# Patient Record
Sex: Female | Born: 1935 | Race: White | Hispanic: No | Marital: Married | Smoking: Never smoker
Health system: Southern US, Community
[De-identification: ages and names within clinical notes are randomized; demographics above are authoritative.]

## PROBLEM LIST (undated history)

## (undated) DIAGNOSIS — I482 Chronic atrial fibrillation, unspecified: Secondary | ICD-10-CM

## (undated) DIAGNOSIS — Z7901 Long term (current) use of anticoagulants: Secondary | ICD-10-CM

## (undated) DIAGNOSIS — E039 Hypothyroidism, unspecified: Secondary | ICD-10-CM

## (undated) DIAGNOSIS — G5600 Carpal tunnel syndrome, unspecified upper limb: Secondary | ICD-10-CM

## (undated) DIAGNOSIS — R0981 Nasal congestion: Secondary | ICD-10-CM

## (undated) HISTORY — DX: Long term (current) use of anticoagulants: Z79.01

## (undated) HISTORY — DX: Hypothyroidism, unspecified: E03.9

## (undated) HISTORY — PX: CHOLECYSTECTOMY: SHX55

## (undated) HISTORY — DX: Chronic atrial fibrillation, unspecified: I48.20

## (undated) HISTORY — DX: Nasal congestion: R09.81

## (undated) HISTORY — DX: Carpal tunnel syndrome, unspecified upper limb: G56.00

## (undated) HISTORY — PX: APPENDECTOMY: SHX54

---

## 1998-02-21 ENCOUNTER — Ambulatory Visit (HOSPITAL_COMMUNITY): Admission: RE | Admit: 1998-02-21 | Discharge: 1998-02-21 | Payer: Self-pay | Admitting: Gastroenterology

## 1999-06-11 ENCOUNTER — Encounter: Payer: Self-pay | Admitting: Family Medicine

## 1999-06-11 ENCOUNTER — Encounter: Admission: RE | Admit: 1999-06-11 | Discharge: 1999-06-11 | Payer: Self-pay | Admitting: Family Medicine

## 1999-07-24 ENCOUNTER — Encounter: Admission: RE | Admit: 1999-07-24 | Discharge: 1999-07-24 | Payer: Self-pay | Admitting: Family Medicine

## 1999-07-24 ENCOUNTER — Encounter: Payer: Self-pay | Admitting: Family Medicine

## 1999-08-28 ENCOUNTER — Encounter: Payer: Self-pay | Admitting: Family Medicine

## 1999-08-28 ENCOUNTER — Encounter: Admission: RE | Admit: 1999-08-28 | Discharge: 1999-08-28 | Payer: Self-pay | Admitting: Family Medicine

## 2000-05-28 ENCOUNTER — Encounter: Payer: Self-pay | Admitting: Family Medicine

## 2000-05-28 ENCOUNTER — Encounter: Admission: RE | Admit: 2000-05-28 | Discharge: 2000-05-28 | Payer: Self-pay | Admitting: Family Medicine

## 2000-06-10 ENCOUNTER — Encounter: Admission: RE | Admit: 2000-06-10 | Discharge: 2000-09-08 | Payer: Self-pay | Admitting: Family Medicine

## 2000-09-18 ENCOUNTER — Encounter: Payer: Self-pay | Admitting: Family Medicine

## 2000-09-18 ENCOUNTER — Encounter: Admission: RE | Admit: 2000-09-18 | Discharge: 2000-09-18 | Payer: Self-pay | Admitting: Family Medicine

## 2001-04-02 ENCOUNTER — Encounter: Payer: Self-pay | Admitting: Family Medicine

## 2001-04-02 ENCOUNTER — Encounter: Admission: RE | Admit: 2001-04-02 | Discharge: 2001-04-02 | Payer: Self-pay | Admitting: Family Medicine

## 2002-01-05 ENCOUNTER — Encounter: Admission: RE | Admit: 2002-01-05 | Discharge: 2002-01-05 | Payer: Self-pay | Admitting: Family Medicine

## 2002-01-05 ENCOUNTER — Encounter: Payer: Self-pay | Admitting: Family Medicine

## 2002-04-01 ENCOUNTER — Encounter: Payer: Self-pay | Admitting: Family Medicine

## 2002-04-01 ENCOUNTER — Encounter: Admission: RE | Admit: 2002-04-01 | Discharge: 2002-04-01 | Payer: Self-pay | Admitting: Family Medicine

## 2003-01-11 ENCOUNTER — Encounter: Payer: Self-pay | Admitting: Family Medicine

## 2003-01-11 ENCOUNTER — Encounter: Admission: RE | Admit: 2003-01-11 | Discharge: 2003-01-11 | Payer: Self-pay | Admitting: Family Medicine

## 2003-08-15 ENCOUNTER — Ambulatory Visit (HOSPITAL_COMMUNITY): Admission: RE | Admit: 2003-08-15 | Discharge: 2003-08-15 | Payer: Self-pay | Admitting: Gastroenterology

## 2004-01-15 ENCOUNTER — Encounter: Admission: RE | Admit: 2004-01-15 | Discharge: 2004-01-15 | Payer: Self-pay | Admitting: Family Medicine

## 2004-05-17 ENCOUNTER — Encounter: Admission: RE | Admit: 2004-05-17 | Discharge: 2004-05-17 | Payer: Self-pay | Admitting: Family Medicine

## 2004-07-10 HISTORY — PX: CARDIOVASCULAR STRESS TEST: SHX262

## 2005-01-15 ENCOUNTER — Encounter: Admission: RE | Admit: 2005-01-15 | Discharge: 2005-01-15 | Payer: Self-pay | Admitting: Family Medicine

## 2005-10-29 ENCOUNTER — Encounter: Payer: Self-pay | Admitting: Emergency Medicine

## 2005-10-30 ENCOUNTER — Inpatient Hospital Stay (HOSPITAL_COMMUNITY): Admission: EM | Admit: 2005-10-30 | Discharge: 2005-10-31 | Payer: Self-pay | Admitting: Emergency Medicine

## 2005-12-01 ENCOUNTER — Encounter: Admission: RE | Admit: 2005-12-01 | Discharge: 2005-12-01 | Payer: Self-pay | Admitting: Family Medicine

## 2006-01-16 ENCOUNTER — Encounter: Admission: RE | Admit: 2006-01-16 | Discharge: 2006-01-16 | Payer: Self-pay | Admitting: Family Medicine

## 2007-01-20 ENCOUNTER — Encounter: Admission: RE | Admit: 2007-01-20 | Discharge: 2007-01-20 | Payer: Self-pay | Admitting: Family Medicine

## 2007-12-06 HISTORY — PX: US ECHOCARDIOGRAPHY: HXRAD669

## 2008-01-28 ENCOUNTER — Encounter: Admission: RE | Admit: 2008-01-28 | Discharge: 2008-01-28 | Payer: Self-pay | Admitting: Family Medicine

## 2008-03-08 ENCOUNTER — Encounter: Admission: RE | Admit: 2008-03-08 | Discharge: 2008-03-08 | Payer: Self-pay | Admitting: Family Medicine

## 2009-01-31 ENCOUNTER — Encounter: Admission: RE | Admit: 2009-01-31 | Discharge: 2009-01-31 | Payer: Self-pay | Admitting: Family Medicine

## 2009-12-28 ENCOUNTER — Ambulatory Visit: Payer: Self-pay | Admitting: Cardiology

## 2010-01-28 ENCOUNTER — Ambulatory Visit: Payer: Self-pay | Admitting: Cardiology

## 2010-01-30 ENCOUNTER — Encounter: Admission: RE | Admit: 2010-01-30 | Discharge: 2010-01-30 | Payer: Self-pay | Admitting: Family Medicine

## 2010-02-25 ENCOUNTER — Ambulatory Visit: Payer: Self-pay | Admitting: Cardiology

## 2010-03-12 ENCOUNTER — Ambulatory Visit: Payer: Self-pay | Admitting: Cardiovascular Disease

## 2010-04-01 ENCOUNTER — Ambulatory Visit: Payer: Self-pay | Admitting: Cardiology

## 2010-04-15 ENCOUNTER — Ambulatory Visit: Payer: Self-pay | Admitting: Cardiology

## 2010-05-16 ENCOUNTER — Ambulatory Visit: Payer: Self-pay | Admitting: Cardiology

## 2010-06-12 ENCOUNTER — Ambulatory Visit: Payer: Self-pay | Admitting: Cardiovascular Disease

## 2010-07-05 ENCOUNTER — Ambulatory Visit (INDEPENDENT_AMBULATORY_CARE_PROVIDER_SITE_OTHER): Payer: Medicare Other | Admitting: Cardiology

## 2010-07-05 DIAGNOSIS — I119 Hypertensive heart disease without heart failure: Secondary | ICD-10-CM

## 2010-07-05 DIAGNOSIS — I4891 Unspecified atrial fibrillation: Secondary | ICD-10-CM

## 2010-07-05 DIAGNOSIS — E119 Type 2 diabetes mellitus without complications: Secondary | ICD-10-CM

## 2010-07-16 ENCOUNTER — Other Ambulatory Visit: Payer: Self-pay | Admitting: *Deleted

## 2010-07-16 ENCOUNTER — Ambulatory Visit
Admission: RE | Admit: 2010-07-16 | Discharge: 2010-07-16 | Disposition: A | Discharge status: Home / Self Care | Payer: PRIVATE HEALTH INSURANCE | Source: Ambulatory Visit | Attending: *Deleted | Admitting: *Deleted

## 2010-08-05 ENCOUNTER — Encounter (INDEPENDENT_AMBULATORY_CARE_PROVIDER_SITE_OTHER): Payer: Medicare Other

## 2010-08-05 DIAGNOSIS — I4891 Unspecified atrial fibrillation: Secondary | ICD-10-CM

## 2010-08-05 DIAGNOSIS — Z7901 Long term (current) use of anticoagulants: Secondary | ICD-10-CM

## 2010-09-03 ENCOUNTER — Ambulatory Visit (INDEPENDENT_AMBULATORY_CARE_PROVIDER_SITE_OTHER): Payer: Medicare Other | Admitting: *Deleted

## 2010-09-03 DIAGNOSIS — Z7901 Long term (current) use of anticoagulants: Secondary | ICD-10-CM

## 2010-09-03 DIAGNOSIS — I4891 Unspecified atrial fibrillation: Secondary | ICD-10-CM

## 2010-09-03 LAB — POCT INR: INR: 2.6

## 2010-09-04 ENCOUNTER — Encounter: Payer: PRIVATE HEALTH INSURANCE | Admitting: *Deleted

## 2010-09-09 ENCOUNTER — Other Ambulatory Visit: Payer: Self-pay | Admitting: Neurology

## 2010-09-09 DIAGNOSIS — I10 Essential (primary) hypertension: Secondary | ICD-10-CM

## 2010-09-09 DIAGNOSIS — E039 Hypothyroidism, unspecified: Secondary | ICD-10-CM

## 2010-09-09 DIAGNOSIS — G501 Atypical facial pain: Secondary | ICD-10-CM

## 2010-09-09 DIAGNOSIS — E119 Type 2 diabetes mellitus without complications: Secondary | ICD-10-CM

## 2010-09-09 DIAGNOSIS — I4891 Unspecified atrial fibrillation: Secondary | ICD-10-CM

## 2010-09-09 DIAGNOSIS — G5600 Carpal tunnel syndrome, unspecified upper limb: Secondary | ICD-10-CM

## 2010-09-24 ENCOUNTER — Ambulatory Visit
Admission: RE | Admit: 2010-09-24 | Discharge: 2010-09-24 | Disposition: A | Discharge status: Home / Self Care | Payer: Medicare Other | Source: Ambulatory Visit | Attending: Neurology | Admitting: Neurology

## 2010-09-24 DIAGNOSIS — E039 Hypothyroidism, unspecified: Secondary | ICD-10-CM

## 2010-09-24 DIAGNOSIS — E119 Type 2 diabetes mellitus without complications: Secondary | ICD-10-CM

## 2010-09-24 DIAGNOSIS — G5600 Carpal tunnel syndrome, unspecified upper limb: Secondary | ICD-10-CM

## 2010-09-24 DIAGNOSIS — I4891 Unspecified atrial fibrillation: Secondary | ICD-10-CM

## 2010-09-24 DIAGNOSIS — I10 Essential (primary) hypertension: Secondary | ICD-10-CM

## 2010-09-24 DIAGNOSIS — G501 Atypical facial pain: Secondary | ICD-10-CM

## 2010-10-04 NOTE — H&P (Signed)
NAME:  Amanda [name], Amanda [name] NO.:  [id number]   MEDICAL RECORD NO.:  [id number]          PATIENT TYPE:  INP   LOCATION:  1826                         FACILITY:  [facility]   PHYSICIAN:  Sherin Quarry, MD      DATE OF BIRTH:  04-13-1919   DATE OF ADMISSION:  10/29/2005  DATE OF DISCHARGE:                                HISTORY & PHYSICAL   Amanda [name] is an 77 year old lady who is generally in good health.  The events of tonight began when she was coming home from the dentist and  noticed that it had begun to rain.  She tried to run from the car and run up  some steps to get out of the rain; unfortunately when she got to the second  step, she slipped and fell sustaining an injury to her right arm and also  heading her head.  She did not lose consciousness but the blow to her head  was quite significant and she has a large bruise over her eyebrow.  She was  initially seen at [facility] and x-rays of her arm showed that she  had a fractured humerus.  Her arm was placed in a sling.  She was sent for a  CT scan of the brain which showed no acute injury.  Family members were  cautioned about possible concussion and she was discharged to be observed by  the family.  After she was discharge, she went home, got in her recliner,  ate a small snack and then told her daughter that she felt weird.  She said  she felt somewhat nauseated.  Her daughter says that she look like she was  going to pass out so she put her feet up.  In spite of this, she went ahead  and passed out briefly.  She made gurgling sound when she breathed.  EMS was  summoned and she revived but seemed to be confused.  She then vomited on one  occasion and then became completely alert.  Since then, she has been totally  alert and really has no specific complaints except for pain related to the  arm and head injuries.  When she returned the second time, she came to [facility].   On arrival to  the emergency room, her blood pressure was 124/77, pulse 69,  respirations 20, O2 saturation was 93%.  Laboratory studies obtained  included a sodium 137, potassium 4.2, glucose of 217, creatinine was 1.0,  INR was 2.4.  The CT scan of the brain was repeated and once again shows no  significant abnormalities.  Because of this brief syncopal episode, she is  admitted for monitoring.  She denies recent trouble with breathing  difficulty, chest pain, abdominal pain, hematemesis or melena.   PAST MEDICAL HISTORY:  She states that she is allergic to CODEINE and SULFA  drugs.  Her current medications are warfarin which she takes according to  instructions from Dr. Patty Sermons.  She says that she takes one 5 mg tablet 5  days a week and then takes 2 tablets 2 days a week.  She also takes Amaryl 2  mg daily, lisinopril 10 mg daily, aspirin 81 mg daily, Synthroid 88 mcg  daily.   OPERATIONS:  She had a cholecystectomy many years ago.   MEDICAL ILLNESSES:  1.  Atrial fibrillation:  This has been present for five months.  2.  Diabetes:  This is said to be well regulated and her blood sugars are      generally in the range of 120-140 and her A1c hemoglobins have been      consistently less than 7.  3.  Hypertension.  4.  Hypothyroidism.   FAMILY HISTORY:  Noncontributory due to the patient's advanced age.   SOCIAL HISTORY:  She does not smoke.  She does not abuse alcohol or drugs.  She has a large extended family that is very attentive to her needs.   REVIEW OF SYSTEMS:  HEAD:  She has a dull headache associated with a large  bruise on her forehead.  EAR/NOSE/THROAT.  Denies earache, sinus pain or  sore throat.  CHEST:  Denies coughing wheezing or chest congestion.  CARDIOVASCULAR:  See above.  GI:  Besides the one episode of vomiting, she  has not had hematemesis, melena, hematochezia or abdominal pain.  GU:  Denies dysuria or urinary frequency.  NEURO:  There is no history of seizure  or  stroke.  ENDOCRINE:  See above.   PHYSICAL EXAM:  She is currently very alert.  I was asked to examine her out  in the hall so doing a detailed examination was difficult.  HEENT exam was  remarkable for a large bruise over her right eye.  Extraocular movements  were full.  Tympanic membranes were clear.  Nares is patent.  Pharynx is  without erythema or exudate.  The chest is clear.  Cardiovascular exam  reveals irregularly, irregular heart rhythm consistent with atrial  fibrillation.  The abdomen is benign grossly.  There is no guarding or  rebound.  Neurologic testing shows the patient is alert and oriented x3.  Station and gait cannot be tested.  Cerebellar exam was made difficult by  the patient's arm injury.  Examination of the extremities shows the right  arm to be in a sling secondary to the humerus fracture.   IMPRESSION:  1.  Brief syncopal episode:  Consider vagal syndrome or transient      arrhythmia.  2.  History of head injury with possible concussion.  Negative CT scan x2.  3.  Atrial fibrillation.  4.  Diabetes.  5.  Hypertension.  6.  Hypothyroidism.   The patient will be admitted essentially for monitoring.  We will follow  heart rhythm closely.  We will continue her medications.  We will ask Dr.  Patty Sermons to see her and see if he thinks any other studies were indicated.           ______________________________  Sherin Quarry, MD     SY/MEDQ  D:  10/30/2005  T:  10/30/2005  Job:  [id number]   cc:   Donia Guiles, M.D.  Fax: [phone]   Cassell Clement, M.D.  Fax: [phone]

## 2010-10-09 ENCOUNTER — Ambulatory Visit (INDEPENDENT_AMBULATORY_CARE_PROVIDER_SITE_OTHER): Payer: Medicare Other | Admitting: Cardiology

## 2010-10-09 ENCOUNTER — Ambulatory Visit (INDEPENDENT_AMBULATORY_CARE_PROVIDER_SITE_OTHER): Payer: Medicare Other | Admitting: *Deleted

## 2010-10-09 ENCOUNTER — Encounter: Payer: Self-pay | Admitting: Cardiology

## 2010-10-09 DIAGNOSIS — I4891 Unspecified atrial fibrillation: Secondary | ICD-10-CM

## 2010-10-09 DIAGNOSIS — I119 Hypertensive heart disease without heart failure: Secondary | ICD-10-CM

## 2010-10-09 DIAGNOSIS — Z7901 Long term (current) use of anticoagulants: Secondary | ICD-10-CM

## 2010-10-09 DIAGNOSIS — E119 Type 2 diabetes mellitus without complications: Secondary | ICD-10-CM

## 2010-10-09 DIAGNOSIS — E039 Hypothyroidism, unspecified: Secondary | ICD-10-CM

## 2010-10-09 NOTE — Assessment & Plan Note (Signed)
This pleasant elderly woman who just celebrated her 89th birthday last week.  She has a history of atrial fibrillation and is on long-term Coumadin.  Since last visit she's had no new cardiac complaints.  She denies any chest pain or shortness of breath.  Her energy level is good.  She has not had any TIA symptoms or systemic emboli symptoms.  She's not having any problem from her Coumadin and her INRs have remained therapeutic

## 2010-10-09 NOTE — Progress Notes (Signed)
Renie Ora Diclemente Date of Birth:  [birth year] [facility] Card[facility] / [facility] [address]   [address] [place], [place]  [address] [phone]           Fax   [phone]  History of Present Illness: This pleasant 77 year old woman is seen for a scheduled 3 month followup office visit.  She has a history of established atrial fibrillation she has not been having any new cardiac symptoms.  Her last echocardiogram was 12/06/07 at which time she had normal left ventricular systolic function with an ejection fraction of 55-60% and no wall motion abnormalities.  She does have mild left atrial enlargement.  There were no significant valve abnormalities noted on echo.  Patient does not have any history of ischemic heart disease.  She had a normal nuclear stress test on 07/10/04 No evidence of ischemia and showing normal LV function with an ejection fraction of 81%. The patient is diabetic.  She is not having any hypoglycemic episodes.  Patient also has a history of chronic sinus problems and is being treated by her primary care physician and has also seen Dr. Gerilyn Pilgrim who did not recommend any surgery.  Current Outpatient Prescriptions  Medication Sig Dispense Refill  . Acetaminophen (TYLENOL PO) Take by mouth. As needed       . fluticasone (FLONASE) 50 MCG/ACT nasal spray 2 sprays by Nasal route daily.        Marland Kitchen glimepiride (AMARYL) 1 MG tablet Take 1 mg by mouth daily before breakfast. Taking 1and 1/2 daily       . levothyroxine (SYNTHROID, LEVOTHROID) 88 MCG tablet Take 88 mcg by mouth daily.        Marland Kitchen lisinopril (PRINIVIL,ZESTRIL) 10 MG tablet Take 10 mg by mouth daily.        Marland Kitchen loratadine (CLARITIN) 10 MG tablet Take 10 mg by mouth daily.        . Multiple Vitamin (MULTIVITAMIN) tablet Take 1 tablet by mouth daily.        Marland Kitchen omeprazole (PRILOSEC) 20 MG capsule Take 20 mg by mouth daily.        Bertram Gala Glycol-Propyl Glycol 0.4-0.3 % SOLN Apply to eye.        . warfarin (COUMADIN) 5 MG  tablet Take 5 mg by mouth daily. Taking 5mg . X 5 days and 7.5mg  x 2 days or as directed         Allergies  Allergen Reactions  . Codeine   . Sulfa Antibiotics     Patient Active Problem List  Diagnoses  . Atrial fibrillation  . Benign hypertensive heart disease without heart failure  . Hypothyroidism  . Diabetes mellitus    History  Smoking status  . Never Smoker   Smokeless tobacco  . Not on file    History  Alcohol Use: Not on file    No family history on file.  Review of Systems: Constitutional: no fever chills diaphoresis or fatigue or change in weight.  Head and neck: no hearing loss, no epistaxis, no photophobia or visual disturbance. Respiratory: No cough, shortness of breath or wheezing. Cardiovascular: No chest pain peripheral edema, palpitations. Gastrointestinal: No abdominal distention, no abdominal pain, no change in bowel habits hematochezia or melena. Genitourinary: No dysuria, no frequency, no urgency, no nocturia. Musculoskeletal:No arthralgias, no back pain, no gait disturbance or myalgias. Neurological: No dizziness, no headaches, no numbness, no seizures, no syncope, no weakness, no tremors. Hematologic: No lymphadenopathy, no easy bruising. Psychiatric: No confusion, no hallucinations,  no sleep disturbance.    Physical Exam: Filed Vitals:   10/09/10 1116  BP: 136/78  Pulse: 80  The general appearance reveals an alert elderly woman in no distress.Pupils equal and reactive.   Extraocular Movements are full.  There is no scleral icterus.  The mouth and pharynx are normal.  The neck is supple.  The carotids reveal no bruits.  The jugular venous pressure is normal.  The thyroid is not enlarged.  There is no lymphadenopathy.The chest is clear to percussion and auscultation. There are no rales or rhonchi. Expansion of the chest is symmetrical.The precordium is quiet.  The first heart sound is normal.  The second heart sound is physiologically split.   There is no murmur gallop rub or click.  There is no abnormal lift or heave.The abdomen is soft and nontender. Bowel sounds are normal. The liver and spleen are not enlarged. There Are no abdominal masses. There are no bruits.  Normal extremity without phlebitis or edema.  Good pedal pulses are present bilaterally.Strength is normal and symmetrical in all extremities.  There is no lateralizing weakness.  There are no sensory deficits.The skin is warm and dry.  There is no rash.   Assessment / Plan: Her INR is therapeutic at 2.5.  Continue same medication.  Recheck at monthly intervals for protime to we will see her in 3 months for followup office visit

## 2010-10-09 NOTE — Assessment & Plan Note (Signed)
The patient has a past history of mild essential hypertension.  She does not have any history of ischemic heart disease.  His not having any dizzy spells or syncope

## 2010-11-06 ENCOUNTER — Ambulatory Visit (INDEPENDENT_AMBULATORY_CARE_PROVIDER_SITE_OTHER): Payer: Medicare Other | Admitting: *Deleted

## 2010-11-06 DIAGNOSIS — I4891 Unspecified atrial fibrillation: Secondary | ICD-10-CM

## 2010-11-06 DIAGNOSIS — Z7901 Long term (current) use of anticoagulants: Secondary | ICD-10-CM

## 2010-11-06 LAB — POCT INR: INR: 2.9

## 2010-12-04 ENCOUNTER — Ambulatory Visit (INDEPENDENT_AMBULATORY_CARE_PROVIDER_SITE_OTHER): Payer: Medicare Other | Admitting: *Deleted

## 2010-12-04 DIAGNOSIS — Z7901 Long term (current) use of anticoagulants: Secondary | ICD-10-CM

## 2010-12-04 DIAGNOSIS — I4891 Unspecified atrial fibrillation: Secondary | ICD-10-CM

## 2010-12-04 LAB — POCT INR: INR: 2.9

## 2010-12-24 ENCOUNTER — Encounter: Payer: Self-pay | Admitting: Cardiology

## 2010-12-27 ENCOUNTER — Other Ambulatory Visit: Payer: Self-pay | Admitting: Family Medicine

## 2010-12-27 DIAGNOSIS — Z1231 Encounter for screening mammogram for malignant neoplasm of breast: Secondary | ICD-10-CM

## 2011-01-01 ENCOUNTER — Encounter: Payer: Self-pay | Admitting: Cardiology

## 2011-01-01 ENCOUNTER — Ambulatory Visit (INDEPENDENT_AMBULATORY_CARE_PROVIDER_SITE_OTHER): Payer: Medicare Other | Admitting: Cardiology

## 2011-01-01 ENCOUNTER — Ambulatory Visit (INDEPENDENT_AMBULATORY_CARE_PROVIDER_SITE_OTHER): Payer: Medicare Other | Admitting: *Deleted

## 2011-01-01 DIAGNOSIS — I4891 Unspecified atrial fibrillation: Secondary | ICD-10-CM

## 2011-01-01 DIAGNOSIS — I119 Hypertensive heart disease without heart failure: Secondary | ICD-10-CM

## 2011-01-01 DIAGNOSIS — E119 Type 2 diabetes mellitus without complications: Secondary | ICD-10-CM

## 2011-01-01 DIAGNOSIS — Z7901 Long term (current) use of anticoagulants: Secondary | ICD-10-CM

## 2011-01-01 LAB — POCT INR: INR: 3.1

## 2011-01-01 NOTE — Assessment & Plan Note (Signed)
Her diabetes is followed by her primary care provider.  The patient denies any hypoglycemic episodes.

## 2011-01-01 NOTE — Assessment & Plan Note (Signed)
The patient has not been having any headaches or dizzy spells or other symptoms from her blood pressure.  She's not having any side effects from her medication to

## 2011-01-01 NOTE — Progress Notes (Signed)
Amanda [name] Date of Birth:  February 24, 1919 [facility] / [facility] [address]   [address] [place], [place]  [address] [phone]           Fax   [phone]  History of Present Illness: This pleasant 77 year old woman is seen for a followup office visit.  She has a history of established atrial fibrillation.  Since last visit she's had no new cardiac symptoms.  She does not have any history of angina pectoris or ischemic heart disease.  She has not been expressing any chest pains.  She had a normal nuclear stress test on 07/10/04.  It showed normal left ventricular systolic function with an ejection fraction of 81%.  Her last echocardiogram was 12/06/07 which showed normal left systolic function mild left atrial enlargement and no significant valve abnormalities.  Patient has not been having any complications from her Coumadin.  She has not had any TIA symptoms.  She is a diabetic.  She's had no hypoglycemic episodes.  Current Outpatient Prescriptions  Medication Sig Dispense Refill  . Acetaminophen (TYLENOL PO) Take by mouth. As needed       . fluticasone (FLONASE) 50 MCG/ACT nasal spray 2 sprays by Nasal route daily.        Marland Kitchen glimepiride (AMARYL) 1 MG tablet Take 1 mg by mouth daily before breakfast. Taking 1and 1/2 daily       . levothyroxine (SYNTHROID, LEVOTHROID) 88 MCG tablet Take 88 mcg by mouth daily.        Marland Kitchen lisinopril (PRINIVIL,ZESTRIL) 10 MG tablet Take 10 mg by mouth daily.        Marland Kitchen loratadine (CLARITIN) 10 MG tablet Take 10 mg by mouth daily.        . Multiple Vitamin (MULTIVITAMIN) tablet Take 1 tablet by mouth daily.        Marland Kitchen omeprazole (PRILOSEC) 20 MG capsule Take 20 mg by mouth daily.        Bertram Gala Glycol-Propyl Glycol 0.4-0.3 % SOLN Apply to eye.        . warfarin (COUMADIN) 5 MG tablet Take 5 mg by mouth daily. Taking 5mg . X 5 days and 7.5mg  x 2 days or as directed         Allergies  Allergen Reactions  . Codeine   . Metoprolol   .  Rythmol (Propafenone Hcl)     dizzy  . Sulfa Antibiotics     Patient Active Problem List  Diagnoses  . Atrial fibrillation  . Benign hypertensive heart disease without heart failure  . Hypothyroidism  . Diabetes mellitus    History  Smoking status  . Never Smoker   Smokeless tobacco  . Not on file    History  Alcohol Use No    Family History  Problem Relation Age of Onset  . Stroke Mother   . Diabetes Sister     Review of Systems: Constitutional: no fever chills diaphoresis or fatigue or change in weight.  Head and neck: no hearing loss, no epistaxis, no photophobia or visual disturbance. Respiratory: No cough, shortness of breath or wheezing. Cardiovascular: No chest pain peripheral edema, palpitations. Gastrointestinal: No abdominal distention, no abdominal pain, no change in bowel habits hematochezia or melena. Genitourinary: No dysuria, no frequency, no urgency, no nocturia. Musculoskeletal:No arthralgias, no back pain, no gait disturbance or myalgias. Neurological: No dizziness, no headaches, no numbness, no seizures, no syncope, no weakness, no tremors. Hematologic: No lymphadenopathy, no easy bruising. Psychiatric: No confusion, no hallucinations, no  sleep disturbance.    Physical Exam: Filed Vitals:   01/01/11 1042  BP: 144/80  Pulse: 80  The general appearance reveals a bright elderly woman in no distress.Pupils equal and reactive.   Extraocular Movements are full.  There is no scleral icterus.  The mouth and pharynx are normal.  The neck is supple.  The carotids reveal no bruits.  The jugular venous pressure is normal.  The thyroid is not enlarged.  There is no lymphadenopathy.  The chest is clear to percussion and auscultation. There are no rales or rhonchi. Expansion of the chest is symmetrical.    Heart reveals a soft systolic ejection murmur at the base.  The rhythm is irregular.  There is no gallop. The abdomen is soft and nontender. Bowel sounds  are normal. The liver and spleen are not enlarged. There Are no abdominal masses. There are no bruits.  The pedal pulses are good.  There is no phlebitis or edema.  There is no cyanosis or clubbing.  Strength is normal and symmetrical in all extremities.  There is no lateralizing weakness.  There are no sensory deficits.     Assessment / Plan: Her INR is 3.1.  Presently she takes 5 mg Coumadin 5 days and 7.5 mg Coumadin 2 days.  She will continue same dose of Coumadin and she will increase her intake of green vegetables slightly.  Recheck in 4 weeks for protime and in 4 months for office visit and protime

## 2011-01-01 NOTE — Assessment & Plan Note (Signed)
She has chronic established atrial fibrillation.  No thromboembolic episodes.  No symptoms of congestive heart failure.

## 2011-01-29 ENCOUNTER — Encounter: Payer: Medicare Other | Admitting: *Deleted

## 2011-01-29 ENCOUNTER — Ambulatory Visit (INDEPENDENT_AMBULATORY_CARE_PROVIDER_SITE_OTHER): Payer: Medicare Other | Admitting: *Deleted

## 2011-01-29 DIAGNOSIS — I4891 Unspecified atrial fibrillation: Secondary | ICD-10-CM

## 2011-01-29 DIAGNOSIS — Z7901 Long term (current) use of anticoagulants: Secondary | ICD-10-CM

## 2011-02-04 ENCOUNTER — Ambulatory Visit: Payer: Medicare Other

## 2011-02-12 ENCOUNTER — Ambulatory Visit (INDEPENDENT_AMBULATORY_CARE_PROVIDER_SITE_OTHER): Payer: Medicare Other | Admitting: *Deleted

## 2011-02-12 DIAGNOSIS — I4891 Unspecified atrial fibrillation: Secondary | ICD-10-CM

## 2011-02-12 DIAGNOSIS — Z7901 Long term (current) use of anticoagulants: Secondary | ICD-10-CM

## 2011-02-17 ENCOUNTER — Ambulatory Visit: Payer: Medicare Other

## 2011-02-26 ENCOUNTER — Ambulatory Visit (INDEPENDENT_AMBULATORY_CARE_PROVIDER_SITE_OTHER): Payer: Medicare Other | Admitting: *Deleted

## 2011-02-26 DIAGNOSIS — Z7901 Long term (current) use of anticoagulants: Secondary | ICD-10-CM

## 2011-02-26 DIAGNOSIS — I4891 Unspecified atrial fibrillation: Secondary | ICD-10-CM

## 2011-02-26 LAB — POCT INR: INR: 2.5

## 2011-03-26 ENCOUNTER — Ambulatory Visit (INDEPENDENT_AMBULATORY_CARE_PROVIDER_SITE_OTHER): Payer: Medicare Other | Admitting: *Deleted

## 2011-03-26 DIAGNOSIS — I4891 Unspecified atrial fibrillation: Secondary | ICD-10-CM

## 2011-03-26 DIAGNOSIS — Z7901 Long term (current) use of anticoagulants: Secondary | ICD-10-CM

## 2011-05-07 ENCOUNTER — Encounter: Payer: Self-pay | Admitting: Cardiology

## 2011-05-07 ENCOUNTER — Ambulatory Visit (INDEPENDENT_AMBULATORY_CARE_PROVIDER_SITE_OTHER): Payer: Medicare Other | Admitting: Cardiology

## 2011-05-07 ENCOUNTER — Ambulatory Visit (INDEPENDENT_AMBULATORY_CARE_PROVIDER_SITE_OTHER): Payer: Medicare Other | Admitting: *Deleted

## 2011-05-07 VITALS — BP 120/70 | HR 78 | Ht 64.0 in | Wt 186.0 lb

## 2011-05-07 DIAGNOSIS — E119 Type 2 diabetes mellitus without complications: Secondary | ICD-10-CM

## 2011-05-07 DIAGNOSIS — I4891 Unspecified atrial fibrillation: Secondary | ICD-10-CM

## 2011-05-07 DIAGNOSIS — Z7901 Long term (current) use of anticoagulants: Secondary | ICD-10-CM

## 2011-05-07 DIAGNOSIS — I119 Hypertensive heart disease without heart failure: Secondary | ICD-10-CM

## 2011-05-07 DIAGNOSIS — E039 Hypothyroidism, unspecified: Secondary | ICD-10-CM

## 2011-05-07 NOTE — Patient Instructions (Signed)
Your physician recommends that you continue on your current medications as directed. Please refer to the Current Medication list given to you today. Your physician wants you to follow-up in: 4 month You will receive a reminder letter in the mail two months in advance. If you don't receive a letter, please call our office to schedule the follow-up appointment.  

## 2011-05-07 NOTE — Progress Notes (Signed)
Amanda [name] Date of Birth:  04-22-1919 [facility] Cardiology / [facility] [address]   [address] [place], [place]  [address] [phone]           Fax   [phone]  History of Present Illness: This pleasant 77 year old woman is seen for a scheduled four-month followup office visit.  She has a history of established atrial fibrillation.  She is on long-term Coumadin.  She has not been experiencing any falls.  She's not having any side effects from the Coumadin except for an occasional mild nosebleed.  She does not have any history of ischemic heart disease.  She had a normal nuclear stress test in 2006.  She had an echocardiogram in 2009 which showed mild left atrial enlargement no significant valve abnormalities.  Patient is diabetic.  She has a history of hypothyroidism.  Current Outpatient Prescriptions  Medication Sig Dispense Refill  . Acetaminophen (TYLENOL PO) Take by mouth. As needed       . fluticasone (FLONASE) 50 MCG/ACT nasal spray 2 sprays by Nasal route daily.        Marland Kitchen glimepiride (AMARYL) 1 MG tablet Take 1 mg by mouth daily before breakfast. Taking 1and 1/2 daily       . ketotifen (ZADITOR) 0.025 % ophthalmic solution 1 drop 2 (two) times daily.        Marland Kitchen levothyroxine (SYNTHROID, LEVOTHROID) 88 MCG tablet Take 88 mcg by mouth daily.        Marland Kitchen lisinopril (PRINIVIL,ZESTRIL) 10 MG tablet Take 10 mg by mouth daily.        Marland Kitchen loratadine (CLARITIN) 10 MG tablet Take 10 mg by mouth daily.        Marland Kitchen omeprazole (PRILOSEC) 20 MG capsule Take 20 mg by mouth daily.        Bertram Gala Glycol-Propyl Glycol 0.4-0.3 % SOLN Apply to eye.        . warfarin (COUMADIN) 5 MG tablet Take 5 mg by mouth daily. Taking 5mg . X 5 days and 7.5mg  x 2 days or as directed         Allergies  Allergen Reactions  . Codeine   . Metoprolol   . Rythmol (Propafenone Hcl)     dizzy  . Sulfa Antibiotics     Patient Active Problem List  Diagnoses  . Atrial fibrillation  . Benign  hypertensive heart disease without heart failure  . Hypothyroidism  . Diabetes mellitus  . Encounter for long-term (current) use of anticoagulants    History  Smoking status  . Never Smoker   Smokeless tobacco  . Not on file    History  Alcohol Use No    Family History  Problem Relation Age of Onset  . Stroke Mother   . Diabetes Sister     Review of Systems: Constitutional: no fever chills diaphoresis or fatigue or change in weight.  Head and neck: no hearing loss, no epistaxis, no photophobia or visual disturbance. Respiratory: No cough, shortness of breath or wheezing. Cardiovascular: No chest pain peripheral edema, palpitations. Gastrointestinal: No abdominal distention, no abdominal pain, no change in bowel habits hematochezia or melena. Genitourinary: No dysuria, no frequency, no urgency, no nocturia. Musculoskeletal:No arthralgias, no back pain, no gait disturbance or myalgias. Neurological: No dizziness, no headaches, no numbness, no seizures, no syncope, no weakness, no tremors. Hematologic: No lymphadenopathy, no easy bruising. Psychiatric: No confusion, no hallucinations, no sleep disturbance.    Physical Exam: Filed Vitals:   05/07/11 1059  BP: 120/70  Pulse: 78   the general appearance reveals a well-developed alert elderly 77 year old woman in no distress.  She is still able to move well in the exam room without any difficulty getting on and off the exam table.  She is proud that she does not have to use a walker or a cane yet.The head and neck exam reveals pupils equal and reactive.  Extraocular movements are full.  There is no scleral icterus.  The mouth and pharynx are normal.  The neck is supple.  The carotids reveal no bruits.  The jugular venous pressure is normal.  The  thyroid is not enlarged.  There is no lymphadenopathy.  The chest is clear to percussion and auscultation.  There are no rales or rhonchi.  Expansion of the chest is symmetrical.  The  precordium is quiet.  The rhythm is irregular. The first heart sound is normal.  The second heart sound is physiologically split.  There is no murmur gallop rub or click.  There is no abnormal lift or heave.  The abdomen is soft and nontender.  The bowel sounds are normal.  The liver and spleen are not enlarged.  There are no abdominal masses.  There are no abdominal bruits.  Extremities reveal good pedal pulses.  There is no phlebitis or edema.  There is no cyanosis or clubbing.  Strength is normal and symmetrical in all extremities.  There is no lateralizing weakness.  There are no sensory deficits.  The skin is warm and dry.  There is no rash.     Assessment / Plan: Continue same medication.  Recheck in 4 months for office visit and EKG and fasting lab work including A1c

## 2011-05-07 NOTE — Assessment & Plan Note (Signed)
Patient has not been having any recent problems with her blood pressure.  She denies any dizziness or headaches.

## 2011-05-07 NOTE — Assessment & Plan Note (Signed)
No hypoglycemic episodes.  Does try to watch her diet although not as well over the holidays.

## 2011-05-07 NOTE — Assessment & Plan Note (Signed)
Patient is not having any symptoms of congestive heart failure.  No TIA symptoms.  No angina pectoris.

## 2011-05-28 DIAGNOSIS — J309 Allergic rhinitis, unspecified: Secondary | ICD-10-CM | POA: Diagnosis not present

## 2011-05-28 DIAGNOSIS — B9789 Other viral agents as the cause of diseases classified elsewhere: Secondary | ICD-10-CM | POA: Diagnosis not present

## 2011-05-30 ENCOUNTER — Other Ambulatory Visit: Payer: Self-pay | Admitting: Cardiology

## 2011-06-02 ENCOUNTER — Telehealth: Payer: Self-pay | Admitting: Cardiology

## 2011-06-02 MED ORDER — WARFARIN SODIUM 5 MG PO TABS: ORAL_TABLET | ORAL | Status: DC

## 2011-06-02 NOTE — Telephone Encounter (Signed)
Spoke with pt, rx was sent to pharmacy last week for 40 tablets with instructions 1 tablet daily, insurance charged double normal co-pay for 40 tablets for 40 day supply.  Advised pt I would call pharmacy and resent rx #40 tablets is a 30 day supply, changed directions to Use as directed by anticoagulation clinic, and noted 40 tablets is a 30 day supply.  Pt was also confused because instructions on medication list stated 7.5mg  x 2 days and 5mg  x 5 days, which is not pt's current Coumadin dosage.  Clarified with pt she should be taking 5mg  daily except 7.5mg  on Tuesdays.  Medication list changed to UAD.  Called [facility] spoke with Aislimn, pharmacist and advised of new rx sent and she is going to update in the computer.

## 2011-06-02 NOTE — Telephone Encounter (Signed)
New msg Pt is calling about rx for warfarin. She wants to discuss the dosage how she should take Please call

## 2011-06-18 ENCOUNTER — Ambulatory Visit (INDEPENDENT_AMBULATORY_CARE_PROVIDER_SITE_OTHER): Payer: Medicare Other | Admitting: *Deleted

## 2011-06-18 DIAGNOSIS — I4891 Unspecified atrial fibrillation: Secondary | ICD-10-CM | POA: Diagnosis not present

## 2011-06-18 DIAGNOSIS — Z7901 Long term (current) use of anticoagulants: Secondary | ICD-10-CM

## 2011-06-18 LAB — POCT INR: INR: 2.7

## 2011-07-25 DIAGNOSIS — M899 Disorder of bone, unspecified: Secondary | ICD-10-CM | POA: Diagnosis not present

## 2011-07-25 DIAGNOSIS — E782 Mixed hyperlipidemia: Secondary | ICD-10-CM | POA: Diagnosis not present

## 2011-07-25 DIAGNOSIS — J309 Allergic rhinitis, unspecified: Secondary | ICD-10-CM | POA: Diagnosis not present

## 2011-07-25 DIAGNOSIS — E119 Type 2 diabetes mellitus without complications: Secondary | ICD-10-CM | POA: Diagnosis not present

## 2011-07-25 DIAGNOSIS — E039 Hypothyroidism, unspecified: Secondary | ICD-10-CM | POA: Diagnosis not present

## 2011-07-25 DIAGNOSIS — I10 Essential (primary) hypertension: Secondary | ICD-10-CM | POA: Diagnosis not present

## 2011-07-30 ENCOUNTER — Ambulatory Visit (INDEPENDENT_AMBULATORY_CARE_PROVIDER_SITE_OTHER): Payer: Medicare Other | Admitting: *Deleted

## 2011-07-30 DIAGNOSIS — I4891 Unspecified atrial fibrillation: Secondary | ICD-10-CM

## 2011-07-30 DIAGNOSIS — Z7901 Long term (current) use of anticoagulants: Secondary | ICD-10-CM

## 2011-07-30 LAB — POCT INR: INR: 2.7

## 2011-08-06 DIAGNOSIS — J310 Chronic rhinitis: Secondary | ICD-10-CM | POA: Diagnosis not present

## 2011-08-06 DIAGNOSIS — H902 Conductive hearing loss, unspecified: Secondary | ICD-10-CM | POA: Diagnosis not present

## 2011-08-06 DIAGNOSIS — H6120 Impacted cerumen, unspecified ear: Secondary | ICD-10-CM | POA: Diagnosis not present

## 2011-09-05 ENCOUNTER — Encounter: Payer: Self-pay | Admitting: Cardiology

## 2011-09-05 ENCOUNTER — Ambulatory Visit (INDEPENDENT_AMBULATORY_CARE_PROVIDER_SITE_OTHER): Payer: Medicare Other | Admitting: Cardiology

## 2011-09-05 ENCOUNTER — Ambulatory Visit (INDEPENDENT_AMBULATORY_CARE_PROVIDER_SITE_OTHER): Payer: Medicare Other | Admitting: *Deleted

## 2011-09-05 VITALS — BP 130/80 | HR 80 | Ht 63.0 in | Wt 184.0 lb

## 2011-09-05 DIAGNOSIS — Z7901 Long term (current) use of anticoagulants: Secondary | ICD-10-CM | POA: Diagnosis not present

## 2011-09-05 DIAGNOSIS — I119 Hypertensive heart disease without heart failure: Secondary | ICD-10-CM | POA: Diagnosis not present

## 2011-09-05 DIAGNOSIS — I4891 Unspecified atrial fibrillation: Secondary | ICD-10-CM

## 2011-09-05 DIAGNOSIS — E119 Type 2 diabetes mellitus without complications: Secondary | ICD-10-CM | POA: Diagnosis not present

## 2011-09-05 LAB — POCT INR: INR: 2.3

## 2011-09-05 NOTE — Patient Instructions (Signed)
Your physician recommends that you continue on your current medications as directed. Please refer to the Current Medication list given to you today.  Your physician recommends that you schedule a follow-up appointment in: 4 months with fasting labs (lp/bmet/hfp)  

## 2011-09-05 NOTE — Assessment & Plan Note (Signed)
No symptoms of congestive heart failure.  No dizziness syncope or headaches

## 2011-09-05 NOTE — Assessment & Plan Note (Signed)
She is not having any hypoglycemic episodes.  Her weight has been stable.

## 2011-09-05 NOTE — Progress Notes (Signed)
Amanda [name] Date of Birth:  1918/06/29 [facility] [address] [place], [place]  [address] [phone]         Fax   [phone]  History of Present Illness: This pleasant 78 year old lady is seen for a scheduled followup office visit.  She has a history of established atrial fibrillation and is on long-term Coumadin.  She does not have any history of ischemic heart disease.  She had a normal nuclear stress test in 2006.  She had an echocardiogram in 2009 which showed mild left atrial enlargement.  The patient has a history of essential hypertension and diabetes and hypothyroidism.  Current Outpatient Prescriptions  Medication Sig Dispense Refill  . Acetaminophen (TYLENOL PO) Take by mouth. As needed       . fluticasone (FLONASE) 50 MCG/ACT nasal spray Place 2 sprays into the nose daily. As needed      . glimepiride (AMARYL) 1 MG tablet Take 1 mg by mouth daily before breakfast. Taking 1and 1/2 daily       . ketotifen (ZADITOR) 0.025 % ophthalmic solution 1 drop 2 (two) times daily.        Marland Kitchen levothyroxine (SYNTHROID, LEVOTHROID) 88 MCG tablet Take 88 mcg by mouth daily.        Marland Kitchen lisinopril (PRINIVIL,ZESTRIL) 10 MG tablet Take 10 mg by mouth daily.        Marland Kitchen loratadine (CLARITIN) 10 MG tablet Take 10 mg by mouth daily.        Marland Kitchen omeprazole (PRILOSEC) 20 MG capsule Take 20 mg by mouth daily. As needed      . Polyethyl Glycol-Propyl Glycol 0.4-0.3 % SOLN Apply to eye.        . warfarin (COUMADIN) 5 MG tablet Take as directed by anticoagulation clinic  40 tablet  3    Allergies  Allergen Reactions  . Codeine   . Metoprolol   . Rythmol (Propafenone Hcl)     dizzy  . Sulfa Antibiotics     Patient Active Problem List  Diagnoses  . Atrial fibrillation  . Benign hypertensive heart disease without heart failure  . Hypothyroidism  . Diabetes mellitus  . Encounter for long-term (current) use of anticoagulants    History  Smoking status  . Never Smoker     Smokeless tobacco  . Not on file    History  Alcohol Use No    Family History  Problem Relation Age of Onset  . Stroke Mother   . Diabetes Sister     Review of Systems: Constitutional: no fever chills diaphoresis or fatigue or change in weight.  Head and neck: no hearing loss, no epistaxis, no photophobia or visual disturbance. Respiratory: No cough, shortness of breath or wheezing. Cardiovascular: No chest pain peripheral edema, palpitations. Gastrointestinal: No abdominal distention, no abdominal pain, no change in bowel habits hematochezia or melena. Genitourinary: No dysuria, no frequency, no urgency, no nocturia. Musculoskeletal:No arthralgias, no back pain, no gait disturbance or myalgias. Neurological: No dizziness, no headaches, no numbness, no seizures, no syncope, no weakness, no tremors. Hematologic: No lymphadenopathy, no easy bruising. Psychiatric: No confusion, no hallucinations, no sleep disturbance.    Physical Exam: Filed Vitals:   09/05/11 1051  BP: 130/80  Pulse: 80   the general appearance reveals a healthy appearing 78 year old woman in no distress.The head and neck exam reveals pupils equal and reactive.  Extraocular movements are full.  There is no scleral icterus.  The mouth and pharynx are normal.  The  neck is supple.  The carotids reveal no bruits.  The jugular venous pressure is normal.  The  thyroid is not enlarged.  There is no lymphadenopathy.  The chest is clear to percussion and auscultation.  There are no rales or rhonchi.  Expansion of the chest is symmetrical.  The precordium is quiet.  The first heart sound is normal.  The second heart sound is physiologically split.  There is no murmur gallop rub or click.  There is no abnormal lift or heave.  The rhythm is irregular.  The abdomen is soft and nontender.  The bowel sounds are normal.  The liver and spleen are not enlarged.  There are no abdominal masses.  There are no abdominal bruits.   Extremities reveal good pedal pulses.  There is no phlebitis or edema.  There is no cyanosis or clubbing.  Strength is normal and symmetrical in all extremities.  There is no lateralizing weakness.  There are no sensory deficits.  The skin is warm and dry.  There is no rash.     Assessment / Plan:  Continue same medication.  Recheck in 4 months for followup office visit and lab work. She mentioned to me today that she does have a living will and does not want any heroic efforts if she has a sudden heart problem.

## 2011-09-05 NOTE — Assessment & Plan Note (Signed)
He has not had any TIA symptoms.  She's not having any bleeding or problems from the Coumadin

## 2011-10-17 ENCOUNTER — Ambulatory Visit (INDEPENDENT_AMBULATORY_CARE_PROVIDER_SITE_OTHER): Payer: Medicare Other | Admitting: *Deleted

## 2011-10-17 DIAGNOSIS — I4891 Unspecified atrial fibrillation: Secondary | ICD-10-CM

## 2011-10-17 DIAGNOSIS — Z7901 Long term (current) use of anticoagulants: Secondary | ICD-10-CM | POA: Diagnosis not present

## 2011-10-17 LAB — POCT INR: INR: 2.1

## 2011-10-27 ENCOUNTER — Other Ambulatory Visit: Payer: Self-pay | Admitting: Cardiology

## 2011-11-28 ENCOUNTER — Ambulatory Visit (INDEPENDENT_AMBULATORY_CARE_PROVIDER_SITE_OTHER): Payer: Medicare Other

## 2011-11-28 DIAGNOSIS — Z7901 Long term (current) use of anticoagulants: Secondary | ICD-10-CM | POA: Diagnosis not present

## 2011-11-28 DIAGNOSIS — I4891 Unspecified atrial fibrillation: Secondary | ICD-10-CM | POA: Diagnosis not present

## 2011-11-28 LAB — POCT INR: INR: 2.7

## 2012-01-06 ENCOUNTER — Encounter: Payer: Self-pay | Admitting: Cardiology

## 2012-01-06 ENCOUNTER — Ambulatory Visit (INDEPENDENT_AMBULATORY_CARE_PROVIDER_SITE_OTHER): Payer: Medicare Other

## 2012-01-06 ENCOUNTER — Ambulatory Visit (INDEPENDENT_AMBULATORY_CARE_PROVIDER_SITE_OTHER): Payer: Medicare Other | Admitting: Cardiology

## 2012-01-06 VITALS — BP 140/80 | HR 84 | Resp 18 | Ht 65.0 in | Wt 185.1 lb

## 2012-01-06 DIAGNOSIS — R5381 Other malaise: Secondary | ICD-10-CM

## 2012-01-06 DIAGNOSIS — I4891 Unspecified atrial fibrillation: Secondary | ICD-10-CM

## 2012-01-06 DIAGNOSIS — E039 Hypothyroidism, unspecified: Secondary | ICD-10-CM

## 2012-01-06 DIAGNOSIS — E119 Type 2 diabetes mellitus without complications: Secondary | ICD-10-CM

## 2012-01-06 DIAGNOSIS — R5383 Other fatigue: Secondary | ICD-10-CM | POA: Diagnosis not present

## 2012-01-06 DIAGNOSIS — Z7901 Long term (current) use of anticoagulants: Secondary | ICD-10-CM | POA: Diagnosis not present

## 2012-01-06 DIAGNOSIS — I119 Hypertensive heart disease without heart failure: Secondary | ICD-10-CM

## 2012-01-06 LAB — CBC WITH DIFFERENTIAL/PLATELET
Basophils Relative: 1 % (ref 0.0–3.0)
Eosinophils Relative: 2.3 % (ref 0.0–5.0)
HCT: 37.5 % (ref 36.0–46.0)
Hemoglobin: 12.2 g/dL (ref 12.0–15.0)
Lymphs Abs: 1.8 10*3/uL (ref 0.7–4.0)
MCV: 95.3 fl (ref 78.0–100.0)
Monocytes Absolute: 0.6 10*3/uL (ref 0.1–1.0)
RBC: 3.93 Mil/uL (ref 3.87–5.11)
WBC: 6.8 10*3/uL (ref 4.5–10.5)

## 2012-01-06 LAB — LIPID PANEL
HDL: 53 mg/dL (ref >39.00)
Total CHOL/HDL Ratio: 4
Triglycerides: 150 mg/dL — ABNORMAL HIGH (ref 0.0–149.0)

## 2012-01-06 LAB — HEPATIC FUNCTION PANEL
AST: 16 U/L (ref 0–37)
Total Bilirubin: 0.6 mg/dL (ref 0.3–1.2)

## 2012-01-06 LAB — BASIC METABOLIC PANEL
CO2: 31 mEq/L (ref 19–32)
Chloride: 103 mEq/L (ref 96–112)
Glucose, Bld: 120 mg/dL — ABNORMAL HIGH (ref 70–99)
Potassium: 4.4 mEq/L (ref 3.5–5.1)
Sodium: 139 mEq/L (ref 135–145)

## 2012-01-06 LAB — LDL CHOLESTEROL, DIRECT: Direct LDL: 137 mg/dL

## 2012-01-06 LAB — POCT INR: INR: 2.3

## 2012-01-06 NOTE — Assessment & Plan Note (Signed)
The patient has known hypothyroidism and she is on Synthroid 88 mcg daily.  We will check thyroid function studies as well as a CBC today to evaluate her symptoms of malaise and fatigue.  She has not had any symptoms to suggest GI bleed or anemia

## 2012-01-06 NOTE — Assessment & Plan Note (Signed)
The patient has not been experiencing any hypoglycemic episodes. 

## 2012-01-06 NOTE — Patient Instructions (Addendum)
Will obtain labs today and call you with the results (lp.bmet/hfp/tsh/t4/cbc)  Your physician recommends that you continue on your current medications as directed. Please refer to the Current Medication list given to you today.   Your physician recommends that you schedule a follow-up appointment in: 4 months with fasting labs (lp/bmet/hfp)

## 2012-01-06 NOTE — Progress Notes (Signed)
Renie Ora Wessells Date of Birth:  November 20, 1918 [facility] [address] [place], [place]  [address] [phone]         Fax   [phone]  History of Present Illness: This pleasant 78 year old lady is seen for a scheduled followup office visit. She has a history of established atrial fibrillation and is on long-term Coumadin. She does not have any history of ischemic heart disease. She had a normal nuclear stress test in 2006. She had an echocardiogram in 2009 which showed mild left atrial enlargement. The patient has a history of essential hypertension and diabetes and hypothyroidism. Since last visit the patient has been complaining of being tired.  She also has been diagnosed in the past with fibromyalgia and states that she is sore all over her body.  Nonetheless she is still quite active.  She lives in her own home.  A grandson is currently staying with her until he can find a place of his own and she has been preparing meals for him.  She still drives and goes to the grocery store by herself etc. she's been on long-term Coumadin and is tolerating it well has had no TIA symptoms.  Current Outpatient Prescriptions  Medication Sig Dispense Refill  . Acetaminophen (TYLENOL PO) Take by mouth. As needed       . fluticasone (FLONASE) 50 MCG/ACT nasal spray Place 2 sprays into the nose daily. As needed      . glimepiride (AMARYL) 1 MG tablet Take 1 mg by mouth daily before breakfast. Taking 1and 1/2 daily       . levothyroxine (SYNTHROID, LEVOTHROID) 88 MCG tablet Take 88 mcg by mouth daily.        Marland Kitchen lisinopril (PRINIVIL,ZESTRIL) 10 MG tablet Take 10 mg by mouth daily.        . NON FORMULARY Apply to eye as needed. Sustane Eye Drops.      Marland Kitchen omeprazole (PRILOSEC) 20 MG capsule Take 20 mg by mouth daily. As needed      . warfarin (COUMADIN) 5 MG tablet TAKE BY MOUTH AS DIRECTED BY ANTICOAGULATION CLINIC  40 tablet  3  . efavirenz (SUSTIVA) 200 MG capsule Take 600 mg by mouth  at bedtime.      Marland Kitchen loratadine (CLARITIN) 10 MG tablet Take 10 mg by mouth daily.          Allergies  Allergen Reactions  . Codeine   . Metoprolol   . Rythmol (Propafenone Hcl)     dizzy  . Sulfa Antibiotics     Patient Active Problem List  Diagnosis  . Atrial fibrillation  . Benign hypertensive heart disease without heart failure  . Hypothyroidism  . Diabetes mellitus  . Encounter for long-term (current) use of anticoagulants    History  Smoking status  . Never Smoker   Smokeless tobacco  . Not on file    History  Alcohol Use No    Family History  Problem Relation Age of Onset  . Stroke Mother   . Diabetes Sister     Review of Systems: Constitutional: no fever chills diaphoresis or fatigue or change in weight.  Head and neck: no hearing loss, no epistaxis, no photophobia or visual disturbance. Respiratory: No cough, shortness of breath or wheezing. Cardiovascular: No chest pain peripheral edema, palpitations. Gastrointestinal: No abdominal distention, no abdominal pain, no change in bowel habits hematochezia or melena. Genitourinary: No dysuria, no frequency, no urgency, no nocturia. Musculoskeletal:No arthralgias, no back pain, no gait  disturbance or myalgias. Neurological: No dizziness, no headaches, no numbness, no seizures, no syncope, no weakness, no tremors. Hematologic: No lymphadenopathy, no easy bruising. Psychiatric: No confusion, no hallucinations, no sleep disturbance.    Physical Exam: Filed Vitals:   01/06/12 1129  BP: 140/80  Pulse: 84  Resp: 18   general appearance an alert elderly woman in no acute distress.The head and neck exam reveals pupils equal and reactive.  Extraocular movements are full.  There is no scleral icterus.  The mouth and pharynx are normal.  The neck is supple.  The carotids reveal no bruits.  The jugular venous pressure is normal.  The  thyroid is not enlarged.  There is no lymphadenopathy.  The chest is clear to  percussion and auscultation.  There are no rales or rhonchi.  Expansion of the chest is symmetrical.  The precordium is quiet.  The rhythm is irregularly irregular in atrial fibrillation. The first heart sound is normal.  The second heart sound is physiologically split.  There is no murmur gallop rub or click.  There is no abnormal lift or heave.  The abdomen is soft and nontender.  The bowel sounds are normal.  The liver and spleen are not enlarged.  There are no abdominal masses.  There are no abdominal bruits.  Extremities reveal good pedal pulses.  There is no phlebitis or edema.  There is no cyanosis or clubbing.  Strength is normal and symmetrical in all extremities.  There is no lateralizing weakness.  There are no sensory deficits.  The skin is warm and dry.  There is no rash.    Assessment / Plan: Continue present medications.  Await results of today's lab work.  Recheck in 4 months for office visit lipid panel hepatic function panel and basal metabolic panel.

## 2012-01-06 NOTE — Assessment & Plan Note (Signed)
The patient has had no TIA symptoms.  She's had no exacerbation of her mild chronic exertional dyspnea.  No angina pectoris.

## 2012-01-08 ENCOUNTER — Telehealth: Payer: Self-pay | Admitting: *Deleted

## 2012-01-08 NOTE — Telephone Encounter (Signed)
Message copied by Burnell Blanks on Thu Jan 08, 2012  9:30 AM ------      Message from: Cassell Clement      Created: Tue Jan 06, 2012  9:12 PM       Please report.  Liver and kidney function good.  Lipids are slightly up and BS is slightly up.  Thyroid okay. Watch diet carefully.  Send copy of labs to PCP

## 2012-01-08 NOTE — Telephone Encounter (Signed)
Advised patient of lab results, mailed copy, and sent to PCP

## 2012-01-26 DIAGNOSIS — I10 Essential (primary) hypertension: Secondary | ICD-10-CM | POA: Diagnosis not present

## 2012-01-26 DIAGNOSIS — Z23 Encounter for immunization: Secondary | ICD-10-CM | POA: Diagnosis not present

## 2012-01-26 DIAGNOSIS — M899 Disorder of bone, unspecified: Secondary | ICD-10-CM | POA: Diagnosis not present

## 2012-01-26 DIAGNOSIS — E039 Hypothyroidism, unspecified: Secondary | ICD-10-CM | POA: Diagnosis not present

## 2012-01-26 DIAGNOSIS — M949 Disorder of cartilage, unspecified: Secondary | ICD-10-CM | POA: Diagnosis not present

## 2012-01-26 DIAGNOSIS — J309 Allergic rhinitis, unspecified: Secondary | ICD-10-CM | POA: Diagnosis not present

## 2012-01-26 DIAGNOSIS — E119 Type 2 diabetes mellitus without complications: Secondary | ICD-10-CM | POA: Diagnosis not present

## 2012-01-26 DIAGNOSIS — E782 Mixed hyperlipidemia: Secondary | ICD-10-CM | POA: Diagnosis not present

## 2012-02-17 ENCOUNTER — Ambulatory Visit (INDEPENDENT_AMBULATORY_CARE_PROVIDER_SITE_OTHER): Payer: Medicare Other | Admitting: *Deleted

## 2012-02-17 DIAGNOSIS — Z7901 Long term (current) use of anticoagulants: Secondary | ICD-10-CM | POA: Diagnosis not present

## 2012-02-17 DIAGNOSIS — I4891 Unspecified atrial fibrillation: Secondary | ICD-10-CM | POA: Diagnosis not present

## 2012-02-17 LAB — POCT INR: INR: 2.2

## 2012-03-29 ENCOUNTER — Other Ambulatory Visit: Payer: Self-pay | Admitting: *Deleted

## 2012-03-29 MED ORDER — WARFARIN SODIUM 5 MG PO TABS: ORAL_TABLET | ORAL | Status: DC

## 2012-03-31 ENCOUNTER — Ambulatory Visit (INDEPENDENT_AMBULATORY_CARE_PROVIDER_SITE_OTHER): Payer: Medicare Other | Admitting: *Deleted

## 2012-03-31 DIAGNOSIS — I4891 Unspecified atrial fibrillation: Secondary | ICD-10-CM | POA: Diagnosis not present

## 2012-03-31 DIAGNOSIS — Z7901 Long term (current) use of anticoagulants: Secondary | ICD-10-CM | POA: Diagnosis not present

## 2012-03-31 LAB — POCT INR: INR: 2.1

## 2012-04-20 ENCOUNTER — Telehealth: Payer: Self-pay | Admitting: Cardiology

## 2012-04-20 NOTE — Telephone Encounter (Signed)
Pt would like a call back she is wondering why she needs to fast because another provider at [facility] dose her labs

## 2012-04-20 NOTE — Telephone Encounter (Signed)
Patient wanted to cancel labs, has at [facility]

## 2012-05-03 DIAGNOSIS — H903 Sensorineural hearing loss, bilateral: Secondary | ICD-10-CM | POA: Diagnosis not present

## 2012-05-04 ENCOUNTER — Ambulatory Visit (INDEPENDENT_AMBULATORY_CARE_PROVIDER_SITE_OTHER): Payer: Medicare Other | Admitting: Cardiology

## 2012-05-04 ENCOUNTER — Other Ambulatory Visit: Payer: Medicare Other

## 2012-05-04 ENCOUNTER — Encounter: Payer: Self-pay | Admitting: Cardiology

## 2012-05-04 ENCOUNTER — Ambulatory Visit (INDEPENDENT_AMBULATORY_CARE_PROVIDER_SITE_OTHER): Payer: Medicare Other | Admitting: *Deleted

## 2012-05-04 VITALS — BP 130/83 | HR 74 | Ht 63.0 in | Wt 183.0 lb

## 2012-05-04 DIAGNOSIS — I119 Hypertensive heart disease without heart failure: Secondary | ICD-10-CM | POA: Diagnosis not present

## 2012-05-04 DIAGNOSIS — E7800 Pure hypercholesterolemia, unspecified: Secondary | ICD-10-CM

## 2012-05-04 DIAGNOSIS — I4891 Unspecified atrial fibrillation: Secondary | ICD-10-CM

## 2012-05-04 DIAGNOSIS — E119 Type 2 diabetes mellitus without complications: Secondary | ICD-10-CM

## 2012-05-04 DIAGNOSIS — Z7901 Long term (current) use of anticoagulants: Secondary | ICD-10-CM

## 2012-05-04 DIAGNOSIS — E039 Hypothyroidism, unspecified: Secondary | ICD-10-CM

## 2012-05-04 LAB — POCT INR: INR: 2.4

## 2012-05-04 NOTE — Assessment & Plan Note (Signed)
Patient is not having any symptoms of congestive heart failure.  Exercise tolerance is good and she remains quite active at her age

## 2012-05-04 NOTE — Patient Instructions (Addendum)
Your physician recommends that you continue on your current medications as directed. Please refer to the Current Medication list given to you today.  Your physician wants you to follow-up in: 4 MONTH OV/EKG You will receive a reminder letter in the mail two months in advance. If you don't receive a letter, please call our office to schedule the follow-up appointment.  

## 2012-05-04 NOTE — Assessment & Plan Note (Signed)
The patient has not had any TIA symptoms.  She remains on long-term established atrial fibrillation and is on Coumadin.  She is not having any bleeding problems from the Coumadin

## 2012-05-04 NOTE — Progress Notes (Signed)
Amanda [name] Date of Birth:  09/21/1918 [facility] [address] [place], [place]  [address] [phone]         Fax   [phone]  History of Present Illness: This pleasant 78 year old lady is seen for a scheduled followup office visit. She has a history of established atrial fibrillation and is on long-term Coumadin. She does not have any history of ischemic heart disease. She had a normal nuclear stress test in 2006. She had an echocardiogram in 2009 which showed mild left atrial enlargement. The patient has a history of essential hypertension and diabetes and hypothyroidism.  .   Current Outpatient Prescriptions  Medication Sig Dispense Refill  . Acetaminophen (TYLENOL PO) Take by mouth. As needed       . fluticasone (FLONASE) 50 MCG/ACT nasal spray Place 2 sprays into the nose daily. As needed      . glimepiride (AMARYL) 1 MG tablet Taking 1and 1/2 daily      . levothyroxine (SYNTHROID, LEVOTHROID) 88 MCG tablet Take 88 mcg by mouth daily.        Marland Kitchen lisinopril (PRINIVIL,ZESTRIL) 10 MG tablet Take 10 mg by mouth daily.        Marland Kitchen loratadine (CLARITIN) 10 MG tablet Take 10 mg by mouth daily.        . NON FORMULARY Apply to eye as needed. Sustane Eye Drops.      Marland Kitchen omeprazole (PRILOSEC) 20 MG capsule Take 20 mg by mouth daily. As needed      . warfarin (COUMADIN) 5 MG tablet Take as directed by coumadin clinic  40 tablet  3    Allergies  Allergen Reactions  . Codeine   . Metoprolol   . Rythmol (Propafenone Hcl)     dizzy  . Sulfa Antibiotics     Patient Active Problem List  Diagnosis  . Atrial fibrillation  . Benign hypertensive heart disease without heart failure  . Hypothyroidism  . Diabetes mellitus  . Encounter for long-term (current) use of anticoagulants    History  Smoking status  . Never Smoker   Smokeless tobacco  . Not on file    History  Alcohol Use No    Family History  Problem Relation Age of Onset  . Stroke Mother     . Diabetes Sister     Review of Systems: Constitutional: no fever chills diaphoresis or fatigue or change in weight.  Head and neck: no hearing loss, no epistaxis, no photophobia or visual disturbance. Respiratory: No cough, shortness of breath or wheezing. Cardiovascular: No chest pain peripheral edema, palpitations. Gastrointestinal: No abdominal distention, no abdominal pain, no change in bowel habits hematochezia or melena. Genitourinary: No dysuria, no frequency, no urgency, no nocturia. Musculoskeletal:No arthralgias, no back pain, no gait disturbance or myalgias. Neurological: No dizziness, no headaches, no numbness, no seizures, no syncope, no weakness, no tremors. Hematologic: No lymphadenopathy, no easy bruising. Psychiatric: No confusion, no hallucinations, no sleep disturbance.    Physical Exam: Filed Vitals:   05/04/12 0957  BP: 130/83  Pulse: 74   the general appearance reveals a well-developed elderly woman in no distress.The head and neck exam reveals pupils equal and reactive.  Extraocular movements are full.  There is no scleral icterus.  The mouth and pharynx are normal.  The neck is supple.  The carotids reveal no bruits.  The jugular venous pressure is normal.  The  thyroid is not enlarged.  There is no lymphadenopathy.  The chest is clear  to percussion and auscultation.  There are no rales or rhonchi.  Expansion of the chest is symmetrical.  The precordium is quiet.  The first heart sound is normal.  The second heart sound is physiologically split.  There is no murmur gallop rub or click.  There is no abnormal lift or heave.  The abdomen is soft and nontender.  The bowel sounds are normal.  The liver and spleen are not enlarged.  There are no abdominal masses.  There are no abdominal bruits.  Extremities reveal good pedal pulses.  There is no phlebitis or edema.  There is no cyanosis or clubbing.  Strength is normal and symmetrical in all extremities.  There is no  lateralizing weakness.  There are no sensory deficits.  The skin is warm and dry.  There is no rash.     Assessment / Plan: Patient is to continue same medication and be rechecked in 4 months for office visit and EKG.  Her primary care physician is following her lipids and her diabetic parameters.

## 2012-05-04 NOTE — Assessment & Plan Note (Signed)
Patient is clinically euthyroid 

## 2012-05-07 DIAGNOSIS — Z961 Presence of intraocular lens: Secondary | ICD-10-CM | POA: Diagnosis not present

## 2012-05-07 DIAGNOSIS — H52209 Unspecified astigmatism, unspecified eye: Secondary | ICD-10-CM | POA: Diagnosis not present

## 2012-05-07 DIAGNOSIS — E1139 Type 2 diabetes mellitus with other diabetic ophthalmic complication: Secondary | ICD-10-CM | POA: Diagnosis not present

## 2012-06-15 ENCOUNTER — Ambulatory Visit (INDEPENDENT_AMBULATORY_CARE_PROVIDER_SITE_OTHER): Payer: Medicare Other | Admitting: *Deleted

## 2012-06-15 DIAGNOSIS — Z7901 Long term (current) use of anticoagulants: Secondary | ICD-10-CM

## 2012-06-15 DIAGNOSIS — I4891 Unspecified atrial fibrillation: Secondary | ICD-10-CM | POA: Diagnosis not present

## 2012-06-15 LAB — POCT INR: INR: 2.3

## 2012-07-26 ENCOUNTER — Encounter: Payer: Self-pay | Admitting: Cardiology

## 2012-07-26 DIAGNOSIS — I10 Essential (primary) hypertension: Secondary | ICD-10-CM | POA: Diagnosis not present

## 2012-07-26 DIAGNOSIS — E039 Hypothyroidism, unspecified: Secondary | ICD-10-CM | POA: Diagnosis not present

## 2012-07-26 DIAGNOSIS — M899 Disorder of bone, unspecified: Secondary | ICD-10-CM | POA: Diagnosis not present

## 2012-07-26 DIAGNOSIS — E11359 Type 2 diabetes mellitus with proliferative diabetic retinopathy without macular edema: Secondary | ICD-10-CM | POA: Diagnosis not present

## 2012-07-28 ENCOUNTER — Ambulatory Visit (INDEPENDENT_AMBULATORY_CARE_PROVIDER_SITE_OTHER): Payer: Medicare Other | Admitting: *Deleted

## 2012-07-28 DIAGNOSIS — Z7901 Long term (current) use of anticoagulants: Secondary | ICD-10-CM | POA: Diagnosis not present

## 2012-09-01 ENCOUNTER — Other Ambulatory Visit: Payer: Self-pay | Admitting: *Deleted

## 2012-09-01 ENCOUNTER — Ambulatory Visit (INDEPENDENT_AMBULATORY_CARE_PROVIDER_SITE_OTHER): Payer: Medicare Other | Admitting: Pharmacist

## 2012-09-01 ENCOUNTER — Encounter: Payer: Self-pay | Admitting: Cardiology

## 2012-09-01 ENCOUNTER — Ambulatory Visit (INDEPENDENT_AMBULATORY_CARE_PROVIDER_SITE_OTHER): Payer: Medicare Other | Admitting: Cardiology

## 2012-09-01 VITALS — BP 144/76 | HR 79 | Ht 63.0 in | Wt 181.4 lb

## 2012-09-01 DIAGNOSIS — I4891 Unspecified atrial fibrillation: Secondary | ICD-10-CM

## 2012-09-01 DIAGNOSIS — I119 Hypertensive heart disease without heart failure: Secondary | ICD-10-CM

## 2012-09-01 DIAGNOSIS — Z7901 Long term (current) use of anticoagulants: Secondary | ICD-10-CM

## 2012-09-01 DIAGNOSIS — E039 Hypothyroidism, unspecified: Secondary | ICD-10-CM | POA: Diagnosis not present

## 2012-09-01 MED ORDER — WARFARIN SODIUM 5 MG PO TABS: ORAL_TABLET | ORAL | Status: DC

## 2012-09-01 NOTE — Assessment & Plan Note (Signed)
Patient is in chronic established atrial fibrillation.  She is on long-term Coumadin.  She has not been having any problems from her Coumadin.  She has had no TIA symptoms.

## 2012-09-01 NOTE — Assessment & Plan Note (Signed)
The patient is medically euthyroid

## 2012-09-01 NOTE — Assessment & Plan Note (Signed)
The blood pressure is remaining stable on current therapy.  No dizziness or syncope.  No awareness of palpitations.

## 2012-09-01 NOTE — Progress Notes (Signed)
Amanda [name] Date of Birth:  12/03/1918 [facility] [address] [place], [place]  [address] [phone]         Fax   [phone]  History of Present Illness: This pleasant 79 year old lady is seen for a scheduled followup office visit. She has a history of established atrial fibrillation and is on long-term Coumadin. She does not have any history of ischemic heart disease. She had a normal nuclear stress test in 2006. She had an echocardiogram in 2009 which showed mild left atrial enlargement. The patient has a history of essential hypertension and diabetes and hypothyroidism.  Since last visit she has been feeling well. .   Current Outpatient Prescriptions  Medication Sig Dispense Refill  . Acetaminophen (TYLENOL PO) Take by mouth. As needed       . fluticasone (FLONASE) 50 MCG/ACT nasal spray Place 2 sprays into the nose daily. As needed      . glimepiride (AMARYL) 1 MG tablet Taking 1and 1/2 daily      . levothyroxine (SYNTHROID, LEVOTHROID) 88 MCG tablet Take 88 mcg by mouth daily.        Marland Kitchen lisinopril (PRINIVIL,ZESTRIL) 10 MG tablet Take 10 mg by mouth daily.        Marland Kitchen loratadine (CLARITIN) 10 MG tablet Take 10 mg by mouth daily.        . NON FORMULARY Apply to eye as needed. Sustane Eye Drops.      Marland Kitchen omeprazole (PRILOSEC) 20 MG capsule Take 20 mg by mouth daily. As needed      . warfarin (COUMADIN) 5 MG tablet Take as directed by coumadin clinic  40 tablet  3   No current facility-administered medications for this visit.    Allergies  Allergen Reactions  . Codeine   . Metoprolol   . Rythmol (Propafenone Hcl)     dizzy  . Sulfa Antibiotics     Patient Active Problem List  Diagnosis  . Atrial fibrillation  . Benign hypertensive heart disease without heart failure  . Hypothyroidism  . Diabetes mellitus  . Encounter for long-term (current) use of anticoagulants    History  Smoking status  . Never Smoker   Smokeless tobacco  . Not on  file    History  Alcohol Use No    Family History  Problem Relation Age of Onset  . Stroke Mother   . Diabetes Sister     Review of Systems: Constitutional: no fever chills diaphoresis or fatigue or change in weight.  Head and neck: no hearing loss, no epistaxis, no photophobia or visual disturbance. Respiratory: No cough, shortness of breath or wheezing. Cardiovascular: No chest pain peripheral edema, palpitations. Gastrointestinal: No abdominal distention, no abdominal pain, no change in bowel habits hematochezia or melena. Genitourinary: No dysuria, no frequency, no urgency, no nocturia. Musculoskeletal:No arthralgias, no back pain, no gait disturbance or myalgias. Neurological: No dizziness, no headaches, no numbness, no seizures, no syncope, no weakness, no tremors. Hematologic: No lymphadenopathy, no easy bruising. Psychiatric: No confusion, no hallucinations, no sleep disturbance.    Physical Exam: Filed Vitals:   09/01/12 1047  BP: 144/76  Pulse: 79   the general this reveals a well-developed well-nourished elderly woman who is alert and cooperative.The head and neck exam reveals pupils equal and reactive.  Extraocular movements are full.  There is no scleral icterus.  The mouth and pharynx are normal.  The neck is supple.  The carotids reveal no bruits.  The jugular venous pressure  is normal.  The  thyroid is not enlarged.  There is no lymphadenopathy.  The chest is clear to percussion and auscultation.  There are no rales or rhonchi.  Expansion of the chest is symmetrical.  The precordium is quiet.  Pulse is irregularly The first heart sound is normal.  The second heart sound is physiologically split.  There is no murmur gallop rub or click.  There is no abnormal lift or heave.  The abdomen is soft and nontender.  The bowel sounds are normal.  The liver and spleen are not enlarged.  There are no abdominal masses.  There are no abdominal bruits.  Extremities reveal good pedal  pulses.  There is no phlebitis or edema.  There is no cyanosis or clubbing.  Strength is normal and symmetrical in all extremities.  There is no lateralizing weakness.  There are no sensory deficits.  The skin is warm and dry.  There is no rash.  EKG shows atrial fibrillation with a controlled ventricular response  Assessment / Plan: Continue on same medication.  Recheck in 4 months for followup office visit.  She will be getting her blood work at her PCP, including CBC and fasting lipids etc.

## 2012-09-01 NOTE — Patient Instructions (Addendum)
Your physician recommends that you continue on your current medications as directed. Please refer to the Current Medication list given to you today.  Your physician recommends that you schedule a follow-up appointment in: 4 months with fasting labs (lp/bmet/hfp/cbc)  

## 2012-10-05 DIAGNOSIS — H35379 Puckering of macula, unspecified eye: Secondary | ICD-10-CM | POA: Diagnosis not present

## 2012-10-05 DIAGNOSIS — H353190 Nonexudative age-related macular degeneration, unspecified eye, stage unspecified: Secondary | ICD-10-CM | POA: Diagnosis not present

## 2012-10-13 ENCOUNTER — Ambulatory Visit (INDEPENDENT_AMBULATORY_CARE_PROVIDER_SITE_OTHER): Payer: Medicare Other | Admitting: *Deleted

## 2012-10-13 DIAGNOSIS — Z7901 Long term (current) use of anticoagulants: Secondary | ICD-10-CM | POA: Diagnosis not present

## 2012-10-13 DIAGNOSIS — I4891 Unspecified atrial fibrillation: Secondary | ICD-10-CM

## 2012-11-03 ENCOUNTER — Ambulatory Visit (INDEPENDENT_AMBULATORY_CARE_PROVIDER_SITE_OTHER): Payer: Medicare Other | Admitting: Pharmacist

## 2012-11-03 DIAGNOSIS — I4891 Unspecified atrial fibrillation: Secondary | ICD-10-CM | POA: Diagnosis not present

## 2012-11-03 DIAGNOSIS — Z7901 Long term (current) use of anticoagulants: Secondary | ICD-10-CM

## 2012-11-03 LAB — POCT INR: INR: 2.4

## 2012-12-15 ENCOUNTER — Ambulatory Visit (INDEPENDENT_AMBULATORY_CARE_PROVIDER_SITE_OTHER): Payer: Medicare Other | Admitting: *Deleted

## 2012-12-15 DIAGNOSIS — Z7901 Long term (current) use of anticoagulants: Secondary | ICD-10-CM

## 2012-12-15 DIAGNOSIS — I4891 Unspecified atrial fibrillation: Secondary | ICD-10-CM

## 2012-12-29 ENCOUNTER — Ambulatory Visit (INDEPENDENT_AMBULATORY_CARE_PROVIDER_SITE_OTHER): Payer: Medicare Other | Admitting: Cardiology

## 2012-12-29 ENCOUNTER — Ambulatory Visit (INDEPENDENT_AMBULATORY_CARE_PROVIDER_SITE_OTHER): Payer: Medicare Other | Admitting: *Deleted

## 2012-12-29 ENCOUNTER — Encounter: Payer: Self-pay | Admitting: Cardiology

## 2012-12-29 VITALS — BP 144/80 | HR 72 | Ht 63.0 in | Wt 181.1 lb

## 2012-12-29 DIAGNOSIS — Z7901 Long term (current) use of anticoagulants: Secondary | ICD-10-CM

## 2012-12-29 DIAGNOSIS — I4891 Unspecified atrial fibrillation: Secondary | ICD-10-CM

## 2012-12-29 DIAGNOSIS — I119 Hypertensive heart disease without heart failure: Secondary | ICD-10-CM | POA: Diagnosis not present

## 2012-12-29 DIAGNOSIS — E039 Hypothyroidism, unspecified: Secondary | ICD-10-CM | POA: Diagnosis not present

## 2012-12-29 NOTE — Assessment & Plan Note (Addendum)
The patient is on long-term Coumadin.  Her heart rate remains stable atrial fibrillation and she is not on any beta blocker or calcium channel blocker.  She has not been having any symptoms of congestive heart failure.  No TIA symptoms.  No bleeding problems from the Coumadin.

## 2012-12-29 NOTE — Progress Notes (Signed)
Amanda [name] Date of Birth:  May 29, 1918 [facility] [address] [name] [place], [place]  [address] [phone]         Fax   [phone]  History of Present Illness: This pleasant 79 year old lady is seen for a scheduled followup office visit. She has a history of established atrial fibrillation and is on long-term Coumadin. She does not have any history of ischemic heart disease. She had a normal nuclear stress test in 2006. She had an echocardiogram in 2009 which showed mild left atrial enlargement. The patient has a history of essential hypertension and diabetes and hypothyroidism. Since last visit she has been feeling well.  She continues to live by herself in her own home.  She still drives herself to her appointments and to the grocery store. .   Current Outpatient Prescriptions  Medication Sig Dispense Refill  . Acetaminophen (TYLENOL PO) Take by mouth. As needed       . fluticasone (FLONASE) 50 MCG/ACT nasal spray Place 2 sprays into the nose daily. As needed      . glimepiride (AMARYL) 1 MG tablet Taking 1and 1/2 daily      . levothyroxine (SYNTHROID, LEVOTHROID) 88 MCG tablet Take 88 mcg by mouth daily.        Marland Kitchen lisinopril (PRINIVIL,ZESTRIL) 10 MG tablet Take 10 mg by mouth daily.        Marland Kitchen loratadine (CLARITIN) 10 MG tablet Take 10 mg by mouth daily.        . NON FORMULARY Apply to eye as needed. Sustane Eye Drops.      Marland Kitchen omeprazole (PRILOSEC) 20 MG capsule Take 20 mg by mouth daily. As needed      . warfarin (COUMADIN) 5 MG tablet Take as directed by coumadin clinic  40 tablet  3   No current facility-administered medications for this visit.    Allergies  Allergen Reactions  . Codeine   . Metoprolol   . Rythmol [Propafenone Hcl]     dizzy  . Sulfa Antibiotics     Patient Active Problem List   Diagnosis Date Noted  . Atrial fibrillation 09/03/2010    Priority: High  . Benign hypertensive heart disease without heart failure 10/09/2010   Priority: Medium  . Encounter for long-term (current) use of anticoagulants 02/26/2011  . Hypothyroidism 10/09/2010  . Diabetes mellitus 10/09/2010    History  Smoking status  . Never Smoker   Smokeless tobacco  . Not on file    History  Alcohol Use No    Family History  Problem Relation Age of Onset  . Stroke Mother   . Diabetes Sister     Review of Systems: Constitutional: no fever chills diaphoresis or fatigue or change in weight.  Head and neck: no hearing loss, no epistaxis, no photophobia or visual disturbance. Respiratory: No cough, shortness of breath or wheezing. Cardiovascular: No chest pain peripheral edema, palpitations. Gastrointestinal: No abdominal distention, no abdominal pain, no change in bowel habits hematochezia or melena. Genitourinary: No dysuria, no frequency, no urgency, no nocturia. Musculoskeletal:No arthralgias, no back pain, no gait disturbance or myalgias. Neurological: No dizziness, no headaches, no numbness, no seizures, no syncope, no weakness, no tremors. Hematologic: No lymphadenopathy, no easy bruising. Psychiatric: No confusion, no hallucinations, no sleep disturbance.    Physical Exam: Filed Vitals:   12/29/12 1053  BP: 144/80  Pulse: 72   general appearance reveals a well-developed alert elderly woman in no distress.The head and neck exam reveals pupils equal  and reactive.  Extraocular movements are full.  There is no scleral icterus.  The mouth and pharynx are normal.  The neck is supple.  The carotids reveal no bruits.  The jugular venous pressure is normal.  The  thyroid is not enlarged.  There is no lymphadenopathy.  The chest is clear to percussion and auscultation.  There are no rales or rhonchi.  Expansion of the chest is symmetrical.  The precordium is quiet.  The pulse is irregularly irregular. The first heart sound is normal.  The second heart sound is physiologically split.  There is no murmur gallop rub or click.  There is no  abnormal lift or heave.  The abdomen is soft and nontender.  The bowel sounds are normal.  The liver and spleen are not enlarged.  There are no abdominal masses.  There are no abdominal bruits.  Extremities reveal good pedal pulses.  There is no phlebitis or edema.  There is no cyanosis or clubbing.  Strength is normal and symmetrical in all extremities.  There is no lateralizing weakness.  There are no sensory deficits.  The skin is warm and dry.  There is no rash.     Assessment / Plan: Continue on same medication.  Recheck in 4 months for office visit and EKG

## 2012-12-29 NOTE — Assessment & Plan Note (Signed)
The patient is currently euthyroid clinically on current low dose of Synthroid 88 mcg daily

## 2012-12-29 NOTE — Patient Instructions (Addendum)
Your physician recommends that you continue on your current medications as directed. Please refer to the Current Medication list given to you today.  Your physician recommends that you schedule a follow-up appointment in: 4 months ov/ekg

## 2012-12-29 NOTE — Assessment & Plan Note (Signed)
Blood pressures remaining stable on current medication.  No dizzy spells or syncope.  No palpitations.

## 2013-01-26 ENCOUNTER — Ambulatory Visit (INDEPENDENT_AMBULATORY_CARE_PROVIDER_SITE_OTHER): Payer: Medicare Other | Admitting: *Deleted

## 2013-01-26 DIAGNOSIS — J019 Acute sinusitis, unspecified: Secondary | ICD-10-CM | POA: Diagnosis not present

## 2013-01-26 DIAGNOSIS — I4891 Unspecified atrial fibrillation: Secondary | ICD-10-CM | POA: Diagnosis not present

## 2013-01-26 DIAGNOSIS — Z7901 Long term (current) use of anticoagulants: Secondary | ICD-10-CM | POA: Diagnosis not present

## 2013-01-26 LAB — POCT INR: INR: 2.9

## 2013-01-27 ENCOUNTER — Other Ambulatory Visit: Payer: Self-pay | Admitting: Cardiology

## 2013-02-08 DIAGNOSIS — R319 Hematuria, unspecified: Secondary | ICD-10-CM | POA: Diagnosis not present

## 2013-02-08 DIAGNOSIS — E782 Mixed hyperlipidemia: Secondary | ICD-10-CM | POA: Diagnosis not present

## 2013-02-08 DIAGNOSIS — I10 Essential (primary) hypertension: Secondary | ICD-10-CM | POA: Diagnosis not present

## 2013-02-08 DIAGNOSIS — E11359 Type 2 diabetes mellitus with proliferative diabetic retinopathy without macular edema: Secondary | ICD-10-CM | POA: Diagnosis not present

## 2013-02-08 DIAGNOSIS — I4891 Unspecified atrial fibrillation: Secondary | ICD-10-CM | POA: Diagnosis not present

## 2013-02-08 DIAGNOSIS — E039 Hypothyroidism, unspecified: Secondary | ICD-10-CM | POA: Diagnosis not present

## 2013-02-08 DIAGNOSIS — E1139 Type 2 diabetes mellitus with other diabetic ophthalmic complication: Secondary | ICD-10-CM | POA: Diagnosis not present

## 2013-03-09 ENCOUNTER — Ambulatory Visit (INDEPENDENT_AMBULATORY_CARE_PROVIDER_SITE_OTHER): Payer: Medicare Other | Admitting: Pharmacist

## 2013-03-09 DIAGNOSIS — Z7901 Long term (current) use of anticoagulants: Secondary | ICD-10-CM

## 2013-03-09 DIAGNOSIS — I4891 Unspecified atrial fibrillation: Secondary | ICD-10-CM

## 2013-03-09 LAB — POCT INR: INR: 3.1

## 2013-04-06 ENCOUNTER — Ambulatory Visit (INDEPENDENT_AMBULATORY_CARE_PROVIDER_SITE_OTHER): Payer: Medicare Other | Admitting: Pharmacist

## 2013-04-06 DIAGNOSIS — I4891 Unspecified atrial fibrillation: Secondary | ICD-10-CM

## 2013-04-06 DIAGNOSIS — Z7901 Long term (current) use of anticoagulants: Secondary | ICD-10-CM | POA: Diagnosis not present

## 2013-04-06 LAB — POCT INR: INR: 2.6

## 2013-04-19 DIAGNOSIS — H353190 Nonexudative age-related macular degeneration, unspecified eye, stage unspecified: Secondary | ICD-10-CM | POA: Diagnosis not present

## 2013-04-19 DIAGNOSIS — H43819 Vitreous degeneration, unspecified eye: Secondary | ICD-10-CM | POA: Diagnosis not present

## 2013-05-02 ENCOUNTER — Ambulatory Visit (INDEPENDENT_AMBULATORY_CARE_PROVIDER_SITE_OTHER): Payer: Medicare Other | Admitting: *Deleted

## 2013-05-02 ENCOUNTER — Ambulatory Visit (INDEPENDENT_AMBULATORY_CARE_PROVIDER_SITE_OTHER): Payer: Medicare Other | Admitting: Cardiology

## 2013-05-02 ENCOUNTER — Encounter: Payer: Self-pay | Admitting: Cardiology

## 2013-05-02 VITALS — BP 142/80 | HR 74 | Ht 62.0 in | Wt 181.8 lb

## 2013-05-02 DIAGNOSIS — I119 Hypertensive heart disease without heart failure: Secondary | ICD-10-CM

## 2013-05-02 DIAGNOSIS — E7800 Pure hypercholesterolemia, unspecified: Secondary | ICD-10-CM

## 2013-05-02 DIAGNOSIS — Z7901 Long term (current) use of anticoagulants: Secondary | ICD-10-CM | POA: Diagnosis not present

## 2013-05-02 DIAGNOSIS — I4891 Unspecified atrial fibrillation: Secondary | ICD-10-CM

## 2013-05-02 DIAGNOSIS — E039 Hypothyroidism, unspecified: Secondary | ICD-10-CM | POA: Diagnosis not present

## 2013-05-02 NOTE — Patient Instructions (Signed)
Your physician wants you to follow-up in: 4 months. You will receive a reminder letter in the mail two months in advance. If you don't receive a letter, please call our office to schedule the follow-up appointment.  

## 2013-05-02 NOTE — Assessment & Plan Note (Signed)
The patient has a history of established permanent atrial fibrillation.  She has not been having any TIA symptoms.  She has occasional mild epistaxis.  She has an appointment to see her ENT physician Dr. Narda Bonds later this week.  She has been using Flonase and has been using simply saline nasal spray appropriately.

## 2013-05-02 NOTE — Assessment & Plan Note (Signed)
The patient has not been having any hypoglycemic episodes 

## 2013-05-02 NOTE — Assessment & Plan Note (Signed)
The patient is clinically euthyroid.  Her weight is stable at 181 pounds.

## 2013-05-02 NOTE — Progress Notes (Signed)
Renie Ora Fertig Date of Birth:  20-Jul-1918 [address] [facility] [address] [place], [place]  [address] [phone]         Fax   [phone]  History of Present Illness: This pleasant 79 year old lady is seen for a scheduled followup office visit. She has a history of established atrial fibrillation and is on long-term Coumadin. She does not have any history of ischemic heart disease. She had a normal nuclear stress test in 2006. She had an echocardiogram in 2009 which showed mild left atrial enlargement. The patient has a history of essential hypertension and diabetes and hypothyroidism. Since last visit she has been feeling well.  She continues to live by herself in her own home.  She still drives herself to her appointments and to the grocery store.  Today however her grandson drove her here and they plan to go out for lunch together afterwards. .   Current Outpatient Prescriptions  Medication Sig Dispense Refill  . Acetaminophen (TYLENOL PO) Take by mouth. As needed       . fluticasone (FLONASE) 50 MCG/ACT nasal spray Place 2 sprays into the nose daily. As needed      . glimepiride (AMARYL) 1 MG tablet Taking 1and 1/2 daily      . levothyroxine (SYNTHROID, LEVOTHROID) 88 MCG tablet Take 88 mcg by mouth daily.        Marland Kitchen lisinopril (PRINIVIL,ZESTRIL) 10 MG tablet Take 10 mg by mouth daily.        Marland Kitchen loratadine (CLARITIN) 10 MG tablet Take 10 mg by mouth daily.        . Multiple Vitamins-Minerals (PRESERVISION AREDS 2 PO) Take 1 capsule by mouth 2 (two) times daily.       . NON FORMULARY Apply to eye as needed. Sustane Eye Drops.      Marland Kitchen omeprazole (PRILOSEC) 20 MG capsule Take 20 mg by mouth daily. As needed      . warfarin (COUMADIN) 5 MG tablet TAKE AS DIRECTED BY COUMADIN CLINIC  40 tablet  3   No current facility-administered medications for this visit.    Allergies  Allergen Reactions  . Codeine   . Metoprolol   . Rythmol [Propafenone Hcl]     dizzy  . Sulfa Antibiotics      Patient Active Problem List   Diagnosis Date Noted  . Atrial fibrillation 09/03/2010    Priority: High  . Benign hypertensive heart disease without heart failure 10/09/2010    Priority: Medium  . Encounter for long-term (current) use of anticoagulants 02/26/2011  . Hypothyroidism 10/09/2010  . Diabetes mellitus 10/09/2010    History  Smoking status  . Never Smoker   Smokeless tobacco  . Not on file    History  Alcohol Use No    Family History  Problem Relation Age of Onset  . Stroke Mother   . Diabetes Sister     Review of Systems: Constitutional: no fever chills diaphoresis or fatigue or change in weight.  Head and neck: no hearing loss, no epistaxis, no photophobia or visual disturbance. Respiratory: No cough, shortness of breath or wheezing. Cardiovascular: No chest pain peripheral edema, palpitations. Gastrointestinal: No abdominal distention, no abdominal pain, no change in bowel habits hematochezia or melena. Genitourinary: No dysuria, no frequency, no urgency, no nocturia. Musculoskeletal:No arthralgias, no back pain, no gait disturbance or myalgias. Neurological: No dizziness, no headaches, no numbness, no seizures, no syncope, no weakness, no tremors. Hematologic: No lymphadenopathy, no easy bruising. Psychiatric: No confusion, no  hallucinations, no sleep disturbance.    Physical Exam: Filed Vitals:   05/02/13 1128  BP: 142/80  Pulse: 74   general appearance reveals a well-developed alert elderly woman in no distress.The head and neck exam reveals pupils equal and reactive.  Extraocular movements are full.  There is no scleral icterus.  The mouth and pharynx are normal.  The neck is supple.  The carotids reveal no bruits.  The jugular venous pressure is normal.  The  thyroid is not enlarged.  There is no lymphadenopathy.  The chest is clear to percussion and auscultation.  There are no rales or rhonchi.  Expansion of the chest is symmetrical.  The  precordium is quiet.  The pulse is irregularly irregular. The first heart sound is normal.  The second heart sound is physiologically split.  There is no murmur gallop rub or click.  There is no abnormal lift or heave.  The abdomen is soft and nontender.  The bowel sounds are normal.  The liver and spleen are not enlarged.  There are no abdominal masses.  There are no abdominal bruits.  Extremities reveal good pedal pulses.  There is no phlebitis or edema.  There is no cyanosis or clubbing.  Strength is normal and symmetrical in all extremities.  There is no lateralizing weakness.  There are no sensory deficits.  The skin is warm and dry.  There is no rash.  EKG today shows atrial fibrillation and is unchanged since 09/01/12.  The ventricular rate is acceptable at 70 per minute   Assessment / Plan: Continue on same medication.  Recheck in 4 months for office visit.

## 2013-05-02 NOTE — Assessment & Plan Note (Signed)
Blood pressure has been remaining stable on current medication.  The patient has not been having any symptoms of congestive heart failure.  No history of angina pectoris.

## 2013-05-04 ENCOUNTER — Encounter: Payer: Self-pay | Admitting: Cardiology

## 2013-05-04 DIAGNOSIS — R040 Epistaxis: Secondary | ICD-10-CM | POA: Diagnosis not present

## 2013-05-04 DIAGNOSIS — J310 Chronic rhinitis: Secondary | ICD-10-CM | POA: Diagnosis not present

## 2013-05-04 DIAGNOSIS — H905 Unspecified sensorineural hearing loss: Secondary | ICD-10-CM | POA: Diagnosis not present

## 2013-05-16 DIAGNOSIS — H903 Sensorineural hearing loss, bilateral: Secondary | ICD-10-CM | POA: Diagnosis not present

## 2013-06-10 ENCOUNTER — Ambulatory Visit (INDEPENDENT_AMBULATORY_CARE_PROVIDER_SITE_OTHER): Payer: Medicare Other

## 2013-06-10 DIAGNOSIS — Z7901 Long term (current) use of anticoagulants: Secondary | ICD-10-CM

## 2013-06-10 DIAGNOSIS — I4891 Unspecified atrial fibrillation: Secondary | ICD-10-CM

## 2013-06-10 LAB — POCT INR: INR: 2

## 2013-06-24 ENCOUNTER — Other Ambulatory Visit: Payer: Self-pay | Admitting: Cardiology

## 2013-07-08 ENCOUNTER — Ambulatory Visit (INDEPENDENT_AMBULATORY_CARE_PROVIDER_SITE_OTHER): Payer: Medicare Other | Admitting: Pharmacist

## 2013-07-08 VITALS — BP 160/80 | HR 72

## 2013-07-08 DIAGNOSIS — Z7901 Long term (current) use of anticoagulants: Secondary | ICD-10-CM | POA: Diagnosis not present

## 2013-07-08 DIAGNOSIS — I4891 Unspecified atrial fibrillation: Secondary | ICD-10-CM

## 2013-07-08 LAB — POCT INR: INR: 2.3

## 2013-07-15 ENCOUNTER — Telehealth: Payer: Self-pay | Admitting: Cardiology

## 2013-07-15 NOTE — Telephone Encounter (Signed)
Spoke with pt.  She was trying to open a package yesterday and cut her finger yesterday.  It bled for a few hours on and off but has stopped at this time.  She cleaned it with peroxide and neosporin and seems to be doing fine now.  INR was 2.3 last week.  Will continue current dose of Coumadin as this does not seem abnormal and follow up as previously scheduled.

## 2013-07-15 NOTE — Telephone Encounter (Signed)
New message         Pt has cut her finger and it has bled for a while. Pt would like a call back

## 2013-07-19 DIAGNOSIS — M79609 Pain in unspecified limb: Secondary | ICD-10-CM | POA: Diagnosis not present

## 2013-08-10 DIAGNOSIS — E11359 Type 2 diabetes mellitus with proliferative diabetic retinopathy without macular edema: Secondary | ICD-10-CM | POA: Diagnosis not present

## 2013-08-10 DIAGNOSIS — M949 Disorder of cartilage, unspecified: Secondary | ICD-10-CM | POA: Diagnosis not present

## 2013-08-10 DIAGNOSIS — E039 Hypothyroidism, unspecified: Secondary | ICD-10-CM | POA: Diagnosis not present

## 2013-08-10 DIAGNOSIS — I4891 Unspecified atrial fibrillation: Secondary | ICD-10-CM | POA: Diagnosis not present

## 2013-08-10 DIAGNOSIS — IMO0001 Reserved for inherently not codable concepts without codable children: Secondary | ICD-10-CM | POA: Diagnosis not present

## 2013-08-10 DIAGNOSIS — M1990 Unspecified osteoarthritis, unspecified site: Secondary | ICD-10-CM | POA: Diagnosis not present

## 2013-08-10 DIAGNOSIS — M899 Disorder of bone, unspecified: Secondary | ICD-10-CM | POA: Diagnosis not present

## 2013-08-10 DIAGNOSIS — E1139 Type 2 diabetes mellitus with other diabetic ophthalmic complication: Secondary | ICD-10-CM | POA: Diagnosis not present

## 2013-08-10 DIAGNOSIS — I10 Essential (primary) hypertension: Secondary | ICD-10-CM | POA: Diagnosis not present

## 2013-08-10 DIAGNOSIS — E782 Mixed hyperlipidemia: Secondary | ICD-10-CM | POA: Diagnosis not present

## 2013-08-19 ENCOUNTER — Ambulatory Visit (INDEPENDENT_AMBULATORY_CARE_PROVIDER_SITE_OTHER): Payer: Medicare Other | Admitting: *Deleted

## 2013-08-19 DIAGNOSIS — Z5181 Encounter for therapeutic drug level monitoring: Secondary | ICD-10-CM | POA: Insufficient documentation

## 2013-08-19 DIAGNOSIS — I4891 Unspecified atrial fibrillation: Secondary | ICD-10-CM

## 2013-08-19 DIAGNOSIS — Z7901 Long term (current) use of anticoagulants: Secondary | ICD-10-CM

## 2013-08-19 LAB — POCT INR: INR: 1.4

## 2013-08-26 ENCOUNTER — Ambulatory Visit (INDEPENDENT_AMBULATORY_CARE_PROVIDER_SITE_OTHER): Payer: Medicare Other

## 2013-08-26 DIAGNOSIS — Z5181 Encounter for therapeutic drug level monitoring: Secondary | ICD-10-CM

## 2013-08-26 DIAGNOSIS — I4891 Unspecified atrial fibrillation: Secondary | ICD-10-CM | POA: Diagnosis not present

## 2013-08-26 DIAGNOSIS — Z7901 Long term (current) use of anticoagulants: Secondary | ICD-10-CM

## 2013-08-26 LAB — POCT INR: INR: 1.7

## 2013-09-06 ENCOUNTER — Ambulatory Visit: Payer: Medicare Other | Admitting: Cardiology

## 2013-09-07 DIAGNOSIS — IMO0002 Reserved for concepts with insufficient information to code with codable children: Secondary | ICD-10-CM | POA: Diagnosis not present

## 2013-09-07 DIAGNOSIS — E559 Vitamin D deficiency, unspecified: Secondary | ICD-10-CM | POA: Diagnosis not present

## 2013-09-07 DIAGNOSIS — G479 Sleep disorder, unspecified: Secondary | ICD-10-CM | POA: Diagnosis not present

## 2013-09-07 DIAGNOSIS — M1710 Unilateral primary osteoarthritis, unspecified knee: Secondary | ICD-10-CM | POA: Diagnosis not present

## 2013-09-07 DIAGNOSIS — IMO0001 Reserved for inherently not codable concepts without codable children: Secondary | ICD-10-CM | POA: Diagnosis not present

## 2013-09-09 ENCOUNTER — Ambulatory Visit (INDEPENDENT_AMBULATORY_CARE_PROVIDER_SITE_OTHER): Payer: Medicare Other

## 2013-09-09 DIAGNOSIS — I4891 Unspecified atrial fibrillation: Secondary | ICD-10-CM | POA: Diagnosis not present

## 2013-09-09 DIAGNOSIS — Z7901 Long term (current) use of anticoagulants: Secondary | ICD-10-CM | POA: Diagnosis not present

## 2013-09-09 DIAGNOSIS — Z5181 Encounter for therapeutic drug level monitoring: Secondary | ICD-10-CM | POA: Diagnosis not present

## 2013-09-09 LAB — POCT INR: INR: 2

## 2013-09-23 ENCOUNTER — Encounter: Payer: Self-pay | Admitting: Cardiology

## 2013-09-23 ENCOUNTER — Ambulatory Visit (INDEPENDENT_AMBULATORY_CARE_PROVIDER_SITE_OTHER): Payer: Medicare Other | Admitting: Cardiology

## 2013-09-23 ENCOUNTER — Ambulatory Visit (INDEPENDENT_AMBULATORY_CARE_PROVIDER_SITE_OTHER): Payer: Medicare Other

## 2013-09-23 VITALS — BP 128/78 | HR 77 | Ht 62.0 in | Wt 177.0 lb

## 2013-09-23 DIAGNOSIS — E119 Type 2 diabetes mellitus without complications: Secondary | ICD-10-CM

## 2013-09-23 DIAGNOSIS — E039 Hypothyroidism, unspecified: Secondary | ICD-10-CM | POA: Diagnosis not present

## 2013-09-23 DIAGNOSIS — I119 Hypertensive heart disease without heart failure: Secondary | ICD-10-CM

## 2013-09-23 DIAGNOSIS — IMO0001 Reserved for inherently not codable concepts without codable children: Secondary | ICD-10-CM

## 2013-09-23 DIAGNOSIS — Z7901 Long term (current) use of anticoagulants: Secondary | ICD-10-CM | POA: Diagnosis not present

## 2013-09-23 DIAGNOSIS — Z5181 Encounter for therapeutic drug level monitoring: Secondary | ICD-10-CM

## 2013-09-23 DIAGNOSIS — I4891 Unspecified atrial fibrillation: Secondary | ICD-10-CM | POA: Diagnosis not present

## 2013-09-23 DIAGNOSIS — E1165 Type 2 diabetes mellitus with hyperglycemia: Secondary | ICD-10-CM

## 2013-09-23 LAB — POCT INR: INR: 2.1

## 2013-09-23 NOTE — Assessment & Plan Note (Signed)
She is not having any hypoglycemic episodes. 

## 2013-09-23 NOTE — Progress Notes (Signed)
Amanda [name] Date of Birth:  06-28-1918 [facility] [address] [place], [place]  [address] [phone]        Fax   [phone]   History of Present Illness: This pleasant 80 year old lady is seen for a scheduled followup office visit. She has a history of established atrial fibrillation and is on long-term Coumadin. She does not have any history of ischemic heart disease. She had a normal nuclear stress test in 2006. She had an echocardiogram in 2009 which showed mild left atrial enlargement. The patient has a history of essential hypertension and diabetes and hypothyroidism. Since last visit she has been feeling well. She continues to live by herself in her own home. She still drives herself to her appointments and to the grocery store.  She has been under more stress since last visit.  Her 90 year old son who lives in [place] had a stroke and has kidney stones.  Her 45 year old daughter was diagnosed with breast cancer.  Current Outpatient Prescriptions  Medication Sig Dispense Refill  . Acetaminophen (TYLENOL PO) Take by mouth. As needed       . fluticasone (FLONASE) 50 MCG/ACT nasal spray Place 2 sprays into the nose daily. As needed      . glimepiride (AMARYL) 1 MG tablet Taking 1and 1/2 daily      . levothyroxine (SYNTHROID, LEVOTHROID) 88 MCG tablet Take 88 mcg by mouth daily.        Marland Kitchen lisinopril (PRINIVIL,ZESTRIL) 10 MG tablet Take 10 mg by mouth daily.        Marland Kitchen loratadine (CLARITIN) 10 MG tablet Take 10 mg by mouth daily.        . Multiple Vitamins-Minerals (PRESERVISION AREDS 2 PO) Take 1 capsule by mouth 2 (two) times daily.       . NON FORMULARY Apply to eye as needed. Sustane Eye Drops.      Vladimir Faster Glycol-Propyl Glycol (SYSTANE OP) Apply to eye as needed.      . Probiotic Product (PROBIOTIC DAILY PO) Take 1 tablet by mouth as needed.      . warfarin (COUMADIN) 5 MG tablet TAKE BY MOUTH AS DIRECTED BY COUMADIN CLINIC  40 tablet  3    No current facility-administered medications for this visit.    Allergies  Allergen Reactions  . Codeine   . Metoprolol   . Rythmol [Propafenone Hcl]     dizzy  . Sulfa Antibiotics     Patient Active Problem List   Diagnosis Date Noted  . Atrial fibrillation 09/03/2010    Priority: High  . Benign hypertensive heart disease without heart failure 10/09/2010    Priority: Medium  . Encounter for therapeutic drug monitoring 08/19/2013  . Encounter for long-term (current) use of anticoagulants 02/26/2011  . Hypothyroidism 10/09/2010  . Type II or unspecified type diabetes mellitus without mention of complication, uncontrolled 10/09/2010    History  Smoking status  . Never Smoker   Smokeless tobacco  . Not on file    History  Alcohol Use No    Family History  Problem Relation Age of Onset  . Stroke Mother   . Diabetes Sister     Review of Systems: Constitutional: no fever chills diaphoresis or fatigue or change in weight.  Head and neck: no hearing loss, no epistaxis, no photophobia or visual disturbance. Respiratory: No cough, shortness of breath or wheezing. Cardiovascular: No chest pain peripheral edema, palpitations. Gastrointestinal: No abdominal distention, no abdominal pain, no  change in bowel habits hematochezia or melena. Genitourinary: No dysuria, no frequency, no urgency, no nocturia. Musculoskeletal:No arthralgias, no back pain, no gait disturbance or myalgias. Neurological: No dizziness, no headaches, no numbness, no seizures, no syncope, no weakness, no tremors. Hematologic: No lymphadenopathy, no easy bruising. Psychiatric: No confusion, no hallucinations, no sleep disturbance.    Physical Exam: Filed Vitals:   09/23/13 1103  BP: 128/78  Pulse: 77     Assessment / Plan: 1.  Permanent atrial fibrillation 2. essential hypertension without congestive heart failure 3. Hypercholesterolemia 4. Hypothyroidism  Plan: Continue same medication.   Recheck in 4 months for office visit and EKG.  She has had her blood work through her PCP Dr. Derrill Memo

## 2013-09-23 NOTE — Assessment & Plan Note (Signed)
The patient remains in atrial fibrillation with controlled ventricular response.  She is on Coumadin.  She has had no TIA symptoms.

## 2013-09-23 NOTE — Assessment & Plan Note (Signed)
Her exercise tolerance is normal for her age.  She is not having any symptoms of CHF.

## 2013-09-23 NOTE — Patient Instructions (Signed)
Your physician recommends that you continue on your current medications as directed. Please refer to the Current Medication list given to you today.  Your physician recommends that you schedule a follow-up appointment in: 4 month ov/ekg 

## 2013-10-18 DIAGNOSIS — E119 Type 2 diabetes mellitus without complications: Secondary | ICD-10-CM | POA: Diagnosis not present

## 2013-10-18 DIAGNOSIS — H43819 Vitreous degeneration, unspecified eye: Secondary | ICD-10-CM | POA: Diagnosis not present

## 2013-10-18 DIAGNOSIS — H35379 Puckering of macula, unspecified eye: Secondary | ICD-10-CM | POA: Diagnosis not present

## 2013-10-18 DIAGNOSIS — H353190 Nonexudative age-related macular degeneration, unspecified eye, stage unspecified: Secondary | ICD-10-CM | POA: Diagnosis not present

## 2013-10-21 ENCOUNTER — Ambulatory Visit (INDEPENDENT_AMBULATORY_CARE_PROVIDER_SITE_OTHER): Payer: Medicare Other | Admitting: Pharmacist

## 2013-10-21 DIAGNOSIS — I4891 Unspecified atrial fibrillation: Secondary | ICD-10-CM | POA: Diagnosis not present

## 2013-10-21 DIAGNOSIS — Z5181 Encounter for therapeutic drug level monitoring: Secondary | ICD-10-CM

## 2013-10-21 DIAGNOSIS — Z7901 Long term (current) use of anticoagulants: Secondary | ICD-10-CM | POA: Diagnosis not present

## 2013-10-21 LAB — POCT INR: INR: 2.1

## 2013-11-15 ENCOUNTER — Other Ambulatory Visit: Payer: Self-pay | Admitting: Cardiology

## 2013-11-16 ENCOUNTER — Ambulatory Visit (INDEPENDENT_AMBULATORY_CARE_PROVIDER_SITE_OTHER): Payer: Medicare Other | Admitting: *Deleted

## 2013-11-16 DIAGNOSIS — Z7901 Long term (current) use of anticoagulants: Secondary | ICD-10-CM

## 2013-11-16 DIAGNOSIS — Z5181 Encounter for therapeutic drug level monitoring: Secondary | ICD-10-CM

## 2013-11-16 DIAGNOSIS — I4891 Unspecified atrial fibrillation: Secondary | ICD-10-CM

## 2013-11-16 LAB — POCT INR: INR: 2.7

## 2013-11-26 ENCOUNTER — Telehealth: Payer: Self-pay | Admitting: Physician Assistant

## 2013-11-26 ENCOUNTER — Encounter (HOSPITAL_COMMUNITY): Payer: Self-pay | Admitting: Emergency Medicine

## 2013-11-26 ENCOUNTER — Emergency Department (HOSPITAL_COMMUNITY)
Admission: EM | Admit: 2013-11-26 | Discharge: 2013-11-26 | Disposition: A | Discharge status: Home / Self Care | Payer: Medicare Other | Attending: Emergency Medicine | Admitting: Emergency Medicine

## 2013-11-26 DIAGNOSIS — Z79899 Other long term (current) drug therapy: Secondary | ICD-10-CM | POA: Diagnosis not present

## 2013-11-26 DIAGNOSIS — Z7901 Long term (current) use of anticoagulants: Secondary | ICD-10-CM | POA: Diagnosis not present

## 2013-11-26 DIAGNOSIS — N898 Other specified noninflammatory disorders of vagina: Secondary | ICD-10-CM | POA: Insufficient documentation

## 2013-11-26 DIAGNOSIS — Z862 Personal history of diseases of the blood and blood-forming organs and certain disorders involving the immune mechanism: Secondary | ICD-10-CM | POA: Insufficient documentation

## 2013-11-26 DIAGNOSIS — Z8679 Personal history of other diseases of the circulatory system: Secondary | ICD-10-CM | POA: Insufficient documentation

## 2013-11-26 DIAGNOSIS — Z8669 Personal history of other diseases of the nervous system and sense organs: Secondary | ICD-10-CM | POA: Diagnosis not present

## 2013-11-26 DIAGNOSIS — Z8639 Personal history of other endocrine, nutritional and metabolic disease: Secondary | ICD-10-CM | POA: Diagnosis not present

## 2013-11-26 DIAGNOSIS — E119 Type 2 diabetes mellitus without complications: Secondary | ICD-10-CM | POA: Diagnosis not present

## 2013-11-26 DIAGNOSIS — IMO0002 Reserved for concepts with insufficient information to code with codable children: Secondary | ICD-10-CM | POA: Insufficient documentation

## 2013-11-26 DIAGNOSIS — K625 Hemorrhage of anus and rectum: Secondary | ICD-10-CM | POA: Diagnosis not present

## 2013-11-26 DIAGNOSIS — Z8709 Personal history of other diseases of the respiratory system: Secondary | ICD-10-CM | POA: Insufficient documentation

## 2013-11-26 LAB — COMPREHENSIVE METABOLIC PANEL
ALT: 12 U/L (ref 0–35)
AST: 16 U/L (ref 0–37)
Albumin: 3.4 g/dL — ABNORMAL LOW (ref 3.5–5.2)
Alkaline Phosphatase: 112 U/L (ref 39–117)
Anion gap: 15 (ref 5–15)
BUN: 16 mg/dL (ref 6–23)
CALCIUM: 9.1 mg/dL (ref 8.4–10.5)
CO2: 24 mEq/L (ref 19–32)
CREATININE: 0.89 mg/dL (ref 0.50–1.10)
Chloride: 102 mEq/L (ref 96–112)
GFR calc Af Amer: 63 mL/min — ABNORMAL LOW (ref >90)
GFR calc non Af Amer: 54 mL/min — ABNORMAL LOW (ref >90)
Glucose, Bld: 220 mg/dL — ABNORMAL HIGH (ref 70–99)
Potassium: 4.6 mEq/L (ref 3.7–5.3)
Sodium: 141 mEq/L (ref 137–147)
TOTAL PROTEIN: 7.1 g/dL (ref 6.0–8.3)
Total Bilirubin: 0.4 mg/dL (ref 0.3–1.2)

## 2013-11-26 LAB — URINALYSIS, ROUTINE W REFLEX MICROSCOPIC
Bilirubin Urine: NEGATIVE
Glucose, UA: NEGATIVE mg/dL
Hgb urine dipstick: NEGATIVE
Ketones, ur: NEGATIVE mg/dL
Leukocytes, UA: NEGATIVE
Nitrite: NEGATIVE
PROTEIN: NEGATIVE mg/dL
Specific Gravity, Urine: 1.014 (ref 1.005–1.030)
UROBILINOGEN UA: 0.2 mg/dL (ref 0.0–1.0)
pH: 5.5 (ref 5.0–8.0)

## 2013-11-26 LAB — PROTIME-INR
INR: 2.06 — ABNORMAL HIGH (ref 0.00–1.49)
Prothrombin Time: 23.2 seconds — ABNORMAL HIGH (ref 11.6–15.2)

## 2013-11-26 LAB — CBC
HCT: 38.3 % (ref 36.0–46.0)
Hemoglobin: 12.5 g/dL (ref 12.0–15.0)
MCH: 31.6 pg (ref 26.0–34.0)
MCHC: 32.6 g/dL (ref 30.0–36.0)
MCV: 97 fL (ref 78.0–100.0)
Platelets: 175 10*3/uL (ref 150–400)
RBC: 3.95 MIL/uL (ref 3.87–5.11)
RDW: 13.6 % (ref 11.5–15.5)
WBC: 6.7 10*3/uL (ref 4.0–10.5)

## 2013-11-26 LAB — TYPE AND SCREEN
ABO/RH(D): O POS
ANTIBODY SCREEN: NEGATIVE

## 2013-11-26 LAB — POC OCCULT BLOOD, ED: FECAL OCCULT BLD: POSITIVE — AB

## 2013-11-26 LAB — ABO/RH: ABO/RH(D): O POS

## 2013-11-26 NOTE — ED Notes (Signed)
Pt states that she woke up in the middle of the night around 0330 and felt a "wetness" in her panties.  States that she got up to use the bathroom and had saw blood.  Pt states that she is on coumadin.  Pt states that she used a whole roll of toilet paper trying to "clean it up".  States the bleeding stopped around 0600 this morning.  Pt states that she is unsure if it is coming out of her vagina or rectum.  Pt states that she still has all of her female organs.  Just had a "tubal ligation for endometriosis".

## 2013-11-26 NOTE — ED Notes (Signed)
PT-INR redrawn and sent to lab

## 2013-11-26 NOTE — ED Notes (Signed)
Pt denies pain but reports fatigue.

## 2013-11-26 NOTE — ED Notes (Signed)
Assisted Dr. Ashok Cordia with pelvic exam. No specimen were collected.

## 2013-11-26 NOTE — ED Notes (Signed)
MD Steinl at bedside. 

## 2013-11-26 NOTE — ED Provider Notes (Signed)
CSN: [id number]     Arrival date & time 11/26/13  0827 History   First MD Initiated Contact with Patient 11/26/13 [phone]     Chief Complaint  Patient presents with  . Vaginal Bleeding  . Rectal Bleeding     (Consider location/radiation/quality/duration/timing/severity/associated sxs/prior Treatment) Patient is a 80 y.o. female presenting with vaginal bleeding and hematochezia. The history is provided by the patient.  Vaginal Bleeding Associated symptoms: no abdominal pain, no back pain, no dysuria, no fever and no nausea   Rectal Bleeding Associated symptoms: no abdominal pain, no epistaxis, no fever, no light-headedness and no vomiting   pt c/o ?vaginal bleeding this morning.  Pt states she got up around 3 am to urinate/use bathroom.  When she did she noticed moderate amount blood. Denies hx same. No preceding dysuria or unusual urgency or frequency.  Pt states she wasn't sure if blood from vagina or rectum (or urine). Denies fever or chills. Is on coumadin, no other abnormal bruising or bleeding. No abdomen or flank pain. No faintness or lightheadedness.  No recent change in meds. Remote hx hemorrhoids, no hx gi bleeding, recent stools normal. No recent abn vaginal bleeding or hx gyn malignancy.     Past Medical History  Diagnosis Date  . Chronic atrial fibrillation   . Chronic anticoagulation   . Hypothyroidism   . Sinus congestion   . Carpal tunnel syndrome   . Diabetes mellitus     NON-INSULIN   Past Surgical History  Procedure Laterality Date  . Cholecystectomy    . Appendectomy    . US echocardiography  12/06/2007    EF 55-60%  . Cardiovascular stress test  07/10/2004    EF 81%   Family History  Problem Relation Age of Onset  . Stroke Mother   . Diabetes Sister    History  Substance Use Topics  . Smoking status: Never Smoker   . Smokeless tobacco: Not on file  . Alcohol Use: No   OB History   Grav Para Term Preterm Abortions TAB SAB Ect Mult Living                  Review of Systems  Constitutional: Negative for fever and chills.  HENT: Negative for nosebleeds.   Eyes: Negative for redness.  Respiratory: Negative for shortness of breath.   Cardiovascular: Negative for chest pain.  Gastrointestinal: Positive for hematochezia. Negative for nausea, vomiting, abdominal pain and diarrhea.  Genitourinary: Negative for dysuria and flank pain.  Musculoskeletal: Negative for back pain and neck pain.  Skin: Negative for rash.  Neurological: Negative for syncope and light-headedness.  Hematological: Does not bruise/bleed easily.  Psychiatric/Behavioral: Negative for confusion.      Allergies  Codeine; Metoprolol; Rythmol; and Sulfa antibiotics  Home Medications   Prior to Admission medications   Medication Sig Start Date End Date Taking? Authorizing Provider  acetaminophen (TYLENOL) 500 MG tablet Take 500 mg by mouth every 6 (six) hours as needed for mild pain.   Yes Historical Provider, MD  fluticasone (FLONASE) 50 MCG/ACT nasal spray Place 2 sprays into the nose daily.    Yes Historical Provider, MD  glimepiride (AMARYL) 1 MG tablet 1.5 mg daily with breakfast.    Yes Historical Provider, MD  levothyroxine (SYNTHROID, LEVOTHROID) 88 MCG tablet Take 88 mcg by mouth daily.     Yes Historical Provider, MD  lisinopril (PRINIVIL,ZESTRIL) 10 MG tablet Take 10 mg by mouth daily.     Yes Historical Provider, MD  loratadine (CLARITIN) 10 MG tablet Take 10 mg by mouth daily.     Yes Historical Provider, MD  Multiple Vitamins-Minerals (PRESERVISION AREDS 2 PO) Take 1 capsule by mouth 2 (two) times daily.    Yes Historical Provider, MD  OVER THE COUNTER MEDICATION Take 1-2 tablets by mouth 2 (two) times daily. TAKES 2 IN THE MORNING AND 1 AT BEDTIME   Yes Historical Provider, MD  Polyethyl Glycol-Propyl Glycol (SYSTANE OP) Apply 1 drop to eye 3 (three) times daily as needed (dry eyes).    Yes Historical Provider, MD  Probiotic Product (PROBIOTIC DAILY PO)  Take 1 tablet by mouth daily.    Yes Historical Provider, MD  sodium chloride (OCEAN) 0.65 % SOLN nasal spray Place 1 spray into both nostrils as needed for congestion.   Yes Historical Provider, MD  warfarin (COUMADIN) 5 MG tablet 5 mgs daily except 7.5mg s on Tuesdays and Fridays or as directed by coumadin clinic 11/15/13  Yes Darlin Coco, MD   BP 141/84  Pulse 89  Temp(Src) 98 F (36.7 C) (Oral)  Resp 18  SpO2 100% Physical Exam  Nursing note and vitals reviewed. Constitutional: She is oriented to person, place, and time. She appears well-developed and well-nourished. No distress.  HENT:  Mouth/Throat: Oropharynx is clear and moist.  Eyes: Conjunctivae are normal. No scleral icterus.  Neck: Neck supple. No tracheal deviation present.  Cardiovascular: Normal rate.   Pulmonary/Chest: Effort normal. No respiratory distress.  Abdominal: Soft. Normal appearance and bowel sounds are normal. She exhibits no distension and no mass. There is no tenderness. There is no rebound and no guarding.  Genitourinary:  Old, dried blood about skin of anus and perineal area (extending from vulva to anus). Small ext hemorrhoid.  No acutely thrombosed, bleeding, or inflamed hemorrhoids. No anal fissure seen.  Stool is light brown, but tests heme pos, ?related to dried/old blood externally.   Pelvic, no bleeding or mass visualized/felt.   Musculoskeletal: She exhibits no edema.  Neurological: She is alert and oriented to person, place, and time.  Skin: Skin is warm and dry. No rash noted. She is not diaphoretic.  Psychiatric: She has a normal mood and affect.    ED Course  Procedures (including critical care time) Labs Review  Results for orders placed during the hospital encounter of 11/26/13  CBC      Result Value Ref Range   WBC 6.7  4.0 - 10.5 K/uL   RBC 3.95  3.87 - 5.11 MIL/uL   Hemoglobin 12.5  12.0 - 15.0 g/dL   HCT 38.3  36.0 - 46.0 %   MCV 97.0  78.0 - 100.0 fL   MCH 31.6  26.0 - 34.0  pg   MCHC 32.6  30.0 - 36.0 g/dL   RDW 13.6  11.5 - 15.5 %   Platelets 175  150 - 400 K/uL  COMPREHENSIVE METABOLIC PANEL      Result Value Ref Range   Sodium 141  137 - 147 mEq/L   Potassium 4.6  3.7 - 5.3 mEq/L   Chloride 102  96 - 112 mEq/L   CO2 24  19 - 32 mEq/L   Glucose, Bld 220 (*) 70 - 99 mg/dL   BUN 16  6 - 23 mg/dL   Creatinine, Ser 0.89  0.50 - 1.10 mg/dL   Calcium 9.1  8.4 - 10.5 mg/dL   Total Protein 7.1  6.0 - 8.3 g/dL   Albumin 3.4 (*) 3.5 - 5.2 g/dL  AST 16  0 - 37 U/L   ALT 12  0 - 35 U/L   Alkaline Phosphatase 112  39 - 117 U/L   Total Bilirubin 0.4  0.3 - 1.2 mg/dL   GFR calc non Af Amer 54 (*) >90 mL/min   GFR calc Af Amer 63 (*) >90 mL/min   Anion gap 15  5 - 15  URINALYSIS, ROUTINE W REFLEX MICROSCOPIC      Result Value Ref Range   Color, Urine YELLOW  YELLOW   APPearance CLEAR  CLEAR   Specific Gravity, Urine 1.014  1.005 - 1.030   pH 5.5  5.0 - 8.0   Glucose, UA NEGATIVE  NEGATIVE mg/dL   Hgb urine dipstick NEGATIVE  NEGATIVE   Bilirubin Urine NEGATIVE  NEGATIVE   Ketones, ur NEGATIVE  NEGATIVE mg/dL   Protein, ur NEGATIVE  NEGATIVE mg/dL   Urobilinogen, UA 0.2  0.0 - 1.0 mg/dL   Nitrite NEGATIVE  NEGATIVE   Leukocytes, UA NEGATIVE  NEGATIVE  PROTIME-INR      Result Value Ref Range   Prothrombin Time 23.2 (*) 11.6 - 15.2 seconds   INR 2.06 (*) 0.00 - 1.49  POC OCCULT BLOOD, ED      Result Value Ref Range   Fecal Occult Bld POSITIVE (*) NEGATIVE  TYPE AND SCREEN      Result Value Ref Range   ABO/RH(D) O POS     Antibody Screen NEG     Sample Expiration 11/29/2013         MDM  Labs.  Reviewed nursing notes and prior charts for additional history.   Recheck no recurrent bleeding during period of observation in ED.  abd soft nt.  Discussed diff dx w pt, suspect anal fissue, or bleeding int hem most likely.     As no active/recurrent bleeding, labs unremarkable, pt appears stable for d/c.    Mirna Mires, MD 11/26/13 1103

## 2013-11-26 NOTE — Discharge Instructions (Signed)
Currently, we find no evidence of ongoing bleeding - while we do not see the source of your bleeding, it may be from an anal fissure or hemorrhoid. From today's lab work, your blood count/hemoglobin is normal (12.5).   Your coumadin level/INR is 2.  Follow up with your doctor in coming week. If recurrent rectal bleeding, follow up with gi specialist in coming week. Return to ER if worse, recurrent or heavy bleeding, weak/faint, other concern.    Rectal Bleeding Rectal bleeding is when blood passes out of the anus. It is usually a sign that something is wrong. It may not be serious, but it should always be evaluated. Rectal bleeding may present as bright red blood or extremely dark stools. The color may range from dark red or maroon to black (like tar). It is important that the cause of rectal bleeding be identified so treatment can be started and the problem corrected. CAUSES   Hemorrhoids. These are enlarged (dilated) blood vessels or veins in the anal or rectal area.  Fistulas. Theseare abnormal, burrowing channels that usually run from inside the rectum to the skin around the anus. They can bleed.  Anal fissures. This is a tear in the tissue of the anus. Bleeding occurs with bowel movements.  Diverticulosis. This is a condition in which pockets or sacs project from the bowel wall. Occasionally, the sacs can bleed.  Diverticulitis. Thisis an infection involving diverticulosis of the colon.  Proctitis and colitis. These are conditions in which the rectum, colon, or both, can become inflamed and pitted (ulcerated).  Polyps and cancer. Polyps are non-cancerous (benign) growths in the colon that may bleed. Certain types of polyps turn into cancer.  Protrusion of the rectum. Part of the rectum can project from the anus and bleed.  Certain medicines.  Intestinal infections.  Blood vessel abnormalities. HOME CARE INSTRUCTIONS  Eat a high-fiber diet to keep your stool soft.  Limit  activity.  Drink enough fluids to keep your urine clear or pale yellow.  Warm baths may be useful to soothe rectal pain.  Follow up with your caregiver as directed. SEEK IMMEDIATE MEDICAL CARE IF:  You develop increased bleeding.  You have black or dark red stools.  You vomit blood or material that looks like coffee grounds.  You have abdominal pain or tenderness.  You have a fever.  You feel weak, nauseous, or you faint.  You have severe rectal pain or you are unable to have a bowel movement. MAKE SURE YOU:  Understand these instructions.  Will watch your condition.  Will get help right away if you are not doing well or get worse. Document Released: 10/25/2001 Document Revised: 07/28/2011 Document Reviewed: 10/20/2010 [facility] Patient Information 2015 [place], [place]. This information is not intended to replace advice given to you by your health care provider. Make sure you discuss any questions you have with your health care provider.    Anal Fissure, Adult An anal fissure is a small tear or crack in the skin around the anus. Bleeding from a fissure usually stops on its own within a few minutes. However, bleeding will often reoccur with each bowel movement until the crack heals.  CAUSES   Passing large, hard stools.  Frequent diarrheal stools.  Constipation.  Inflammatory bowel disease (Crohn's disease or ulcerative colitis).  Infections.  Anal sex. SYMPTOMS   Small amounts of blood seen on your stools, on toilet paper, or in the toilet after a bowel movement.  Rectal bleeding.  Painful bowel  movements.  Itching or irritation around the anus. DIAGNOSIS Your caregiver will examine the anal area. An anal fissure can usually be seen with careful inspection. A rectal exam may be performed and a short tube (anoscope) may be used to examine the anal canal. TREATMENT   You may be instructed to take fiber supplements. These supplements can soften your stool to  help make bowel movements easier.  Sitz baths may be recommended to help heal the tear. Do not use soap in the sitz baths.  A medicated cream or ointment may be prescribed to lessen discomfort. HOME CARE INSTRUCTIONS   Maintain a diet high in fruits, whole grains, and vegetables. Avoid constipating foods like bananas and dairy products.  Take sitz baths as directed by your caregiver.  Drink enough fluids to keep your urine clear or pale yellow.  Only take over-the-counter or prescription medicines for pain, discomfort, or fever as directed by your caregiver. Do not take aspirin as this may increase bleeding.  Do not use ointments containing numbing medications (anesthetics) or hydrocortisone. They could slow healing. SEEK MEDICAL CARE IF:   Your fissure is not completely healed within 3 days.  You have further bleeding.  You have a fever.  You have diarrhea mixed with blood.  You have pain.  Your problem is getting worse rather than better. MAKE SURE YOU:   Understand these instructions.  Will watch your condition.  Will get help right away if you are not doing well or get worse. Document Released: 05/05/2005 Document Revised: 07/28/2011 Document Reviewed: 10/20/2010 [facility] Patient Information 2015 [place], [place]. This information is not intended to replace advice given to you by your health care provider. Make sure you discuss any questions you have with your health care provider.

## 2013-11-26 NOTE — Telephone Encounter (Signed)
Amanda [name] called because she had developed vaginal bleeding. She stated the bleeding was heavy and she had gotten lightheaded one time. She stated she was going to the emergency room and wanted to know if I could call them and let them know she was coming. She wanted to know if cardiology would see her there.  I discussed the situation with the patient. I advised her that unstable patients were always seen immediately. I advised her that going to the emergency room was the right thing to do. I advised her that if cardiology help as needed they will call us. I reassured her that her INR had been therapeutic but not overly elevated at 2.2 two days ago. He stated she should not drive but should make sure she called EMS or that someone took her to the hospital. The patient indicated understanding.

## 2013-12-07 DIAGNOSIS — J310 Chronic rhinitis: Secondary | ICD-10-CM | POA: Diagnosis not present

## 2013-12-07 DIAGNOSIS — R51 Headache: Secondary | ICD-10-CM | POA: Diagnosis not present

## 2013-12-12 ENCOUNTER — Other Ambulatory Visit (INDEPENDENT_AMBULATORY_CARE_PROVIDER_SITE_OTHER): Payer: Self-pay | Admitting: Otolaryngology

## 2013-12-12 DIAGNOSIS — J329 Chronic sinusitis, unspecified: Secondary | ICD-10-CM

## 2013-12-14 ENCOUNTER — Ambulatory Visit (INDEPENDENT_AMBULATORY_CARE_PROVIDER_SITE_OTHER): Payer: Medicare Other | Admitting: Surgery

## 2013-12-14 DIAGNOSIS — Z7901 Long term (current) use of anticoagulants: Secondary | ICD-10-CM

## 2013-12-14 DIAGNOSIS — I4891 Unspecified atrial fibrillation: Secondary | ICD-10-CM

## 2013-12-14 DIAGNOSIS — Z5181 Encounter for therapeutic drug level monitoring: Secondary | ICD-10-CM | POA: Diagnosis not present

## 2013-12-14 LAB — POCT INR: INR: 2.5

## 2013-12-15 ENCOUNTER — Ambulatory Visit
Admission: RE | Admit: 2013-12-15 | Discharge: 2013-12-15 | Disposition: A | Discharge status: Home / Self Care | Payer: Medicare Other | Source: Ambulatory Visit | Attending: Otolaryngology | Admitting: Otolaryngology

## 2013-12-15 DIAGNOSIS — R609 Edema, unspecified: Secondary | ICD-10-CM | POA: Diagnosis not present

## 2013-12-15 DIAGNOSIS — J329 Chronic sinusitis, unspecified: Secondary | ICD-10-CM

## 2013-12-16 DIAGNOSIS — K5730 Diverticulosis of large intestine without perforation or abscess without bleeding: Secondary | ICD-10-CM | POA: Diagnosis not present

## 2013-12-16 DIAGNOSIS — K625 Hemorrhage of anus and rectum: Secondary | ICD-10-CM | POA: Diagnosis not present

## 2013-12-16 DIAGNOSIS — I4891 Unspecified atrial fibrillation: Secondary | ICD-10-CM | POA: Diagnosis not present

## 2013-12-26 DIAGNOSIS — J320 Chronic maxillary sinusitis: Secondary | ICD-10-CM | POA: Diagnosis not present

## 2013-12-28 ENCOUNTER — Telehealth: Payer: Self-pay | Admitting: Cardiology

## 2013-12-28 DIAGNOSIS — J320 Chronic maxillary sinusitis: Secondary | ICD-10-CM | POA: Diagnosis not present

## 2013-12-28 NOTE — Telephone Encounter (Signed)
Spoke with pt she denies any excessive bleeding, states it just a trickle which is what they told her to expect, thus instructed her not to hold coumadin. Increase her leafy green vegetable intake today and tomorrow but then resume her normal. Call us tomorrow if things are different or bleeding has increased, understand verbalized.

## 2013-12-28 NOTE — Telephone Encounter (Signed)
New message     Pt just had a balloon procedure done in her sinus area (procedure was at 10am).  Her nose is still bleeding.  Should she take her coumadin today?  She did not stop coumadin prior to procedure.

## 2014-01-10 DIAGNOSIS — J320 Chronic maxillary sinusitis: Secondary | ICD-10-CM | POA: Diagnosis not present

## 2014-01-11 ENCOUNTER — Ambulatory Visit (INDEPENDENT_AMBULATORY_CARE_PROVIDER_SITE_OTHER): Payer: Medicare Other | Admitting: *Deleted

## 2014-01-11 DIAGNOSIS — I4891 Unspecified atrial fibrillation: Secondary | ICD-10-CM

## 2014-01-11 DIAGNOSIS — Z7901 Long term (current) use of anticoagulants: Secondary | ICD-10-CM

## 2014-01-11 DIAGNOSIS — Z5181 Encounter for therapeutic drug level monitoring: Secondary | ICD-10-CM | POA: Diagnosis not present

## 2014-01-11 LAB — POCT INR: INR: 2.4

## 2014-02-01 ENCOUNTER — Ambulatory Visit (INDEPENDENT_AMBULATORY_CARE_PROVIDER_SITE_OTHER): Payer: Medicare Other | Admitting: Cardiology

## 2014-02-01 ENCOUNTER — Encounter: Payer: Self-pay | Admitting: Cardiology

## 2014-02-01 ENCOUNTER — Ambulatory Visit (INDEPENDENT_AMBULATORY_CARE_PROVIDER_SITE_OTHER): Payer: Medicare Other | Admitting: *Deleted

## 2014-02-01 VITALS — BP 150/60 | HR 76 | Ht 63.0 in | Wt 176.0 lb

## 2014-02-01 DIAGNOSIS — I4891 Unspecified atrial fibrillation: Secondary | ICD-10-CM | POA: Diagnosis not present

## 2014-02-01 DIAGNOSIS — E039 Hypothyroidism, unspecified: Secondary | ICD-10-CM

## 2014-02-01 DIAGNOSIS — E1165 Type 2 diabetes mellitus with hyperglycemia: Secondary | ICD-10-CM

## 2014-02-01 DIAGNOSIS — Z7901 Long term (current) use of anticoagulants: Secondary | ICD-10-CM

## 2014-02-01 DIAGNOSIS — I482 Chronic atrial fibrillation, unspecified: Secondary | ICD-10-CM

## 2014-02-01 DIAGNOSIS — I119 Hypertensive heart disease without heart failure: Secondary | ICD-10-CM

## 2014-02-01 DIAGNOSIS — IMO0001 Reserved for inherently not codable concepts without codable children: Secondary | ICD-10-CM | POA: Diagnosis not present

## 2014-02-01 DIAGNOSIS — Z5181 Encounter for therapeutic drug level monitoring: Secondary | ICD-10-CM

## 2014-02-01 LAB — POCT INR: INR: 2

## 2014-02-01 NOTE — Progress Notes (Signed)
Amanda [name] Date of Birth:  [birth year] [place] [address] [place], [place]  [address] [phone]        Fax   [phone]   History of Present Illness: This pleasant 80 year old lady is seen for a scheduled followup office visit. She has a history of established atrial fibrillation and is on long-term Coumadin. She does not have any history of ischemic heart disease. She had a normal nuclear stress test in 2006. She had an echocardiogram in 2009 which showed mild left atrial enlargement. The patient has a history of essential hypertension and diabetes and hypothyroidism. Since last visit she has been feeling well. She continues to live by herself in her own home. She still drives herself to her appointments and to the grocery store.  She has been under more stress since last visit.  Her 77 year old son who lives in [place] had a stroke and has kidney stones.  Her 29 year old daughter was diagnosed with breast cancer.  Since last visit she has had a lot of problems with her teeth and has had a crown replaced and has seen an endodontist and an Chief Financial Officer.  She also had a balloon procedure on her sinuses by Dr.Teoh.  Current Outpatient Prescriptions  Medication Sig Dispense Refill  . acetaminophen (TYLENOL) 500 MG tablet Take 500 mg by mouth every 6 (six) hours as needed for mild pain.      . fluticasone (FLONASE) 50 MCG/ACT nasal spray Place 2 sprays into the nose daily.       Marland Kitchen glimepiride (AMARYL) 1 MG tablet 1.5 mg daily with breakfast.       . levothyroxine (SYNTHROID, LEVOTHROID) 88 MCG tablet Take 88 mcg by mouth daily.        Marland Kitchen lisinopril (PRINIVIL,ZESTRIL) 10 MG tablet Take 10 mg by mouth daily.        Marland Kitchen loratadine (CLARITIN) 10 MG tablet Take 10 mg by mouth daily as needed.       . Multiple Vitamins-Minerals (PRESERVISION AREDS 2 PO) Take 1 capsule by mouth 2 (two) times daily.       Vladimir Faster Glycol-Propyl Glycol (SYSTANE OP) Apply 1  drop to eye 3 (three) times daily as needed (dry eyes).       . Probiotic Product (PROBIOTIC DAILY PO) Take 2 tablets by mouth daily.       . sodium chloride (OCEAN) 0.65 % SOLN nasal spray Place 1 spray into both nostrils as needed for congestion (SIMPLY SALINE).       Marland Kitchen warfarin (COUMADIN) 5 MG tablet 5 mgs daily except 7.5mg s on Tuesdays and Fridays or as directed by coumadin clinic  40 tablet  3   No current facility-administered medications for this visit.    Allergies  Allergen Reactions  . Codeine   . Hydrocodone     CAUSES  NAUSEA  . Metoprolol   . Rythmol [Propafenone Hcl]     dizzy  . Sulfa Antibiotics     Patient Active Problem List   Diagnosis Date Noted  . Atrial fibrillation 09/03/2010    Priority: High  . Benign hypertensive heart disease without heart failure 10/09/2010    Priority: Medium  . Encounter for therapeutic drug monitoring 08/19/2013  . Encounter for long-term (current) use of anticoagulants 02/26/2011  . Hypothyroidism 10/09/2010  . Type II or unspecified type diabetes mellitus without mention of complication, uncontrolled 10/09/2010    History  Smoking status  . Never Smoker  Smokeless tobacco  . Not on file    History  Alcohol Use No    Family History  Problem Relation Age of Onset  . Stroke Mother   . Diabetes Sister   . Heart attack Neg Hx   . Hypertension    . Hypertension Mother     Review of Systems: Constitutional: no fever chills diaphoresis or fatigue or change in weight.  Head and neck: no hearing loss, no epistaxis, no photophobia or visual disturbance. Respiratory: No cough, shortness of breath or wheezing. Cardiovascular: No chest pain peripheral edema, palpitations. Gastrointestinal: No abdominal distention, no abdominal pain, no change in bowel habits hematochezia or melena. Genitourinary: No dysuria, no frequency, no urgency, no nocturia. Musculoskeletal:No arthralgias, no back pain, no gait disturbance or  myalgias. Neurological: No dizziness, no headaches, no numbness, no seizures, no syncope, no weakness, no tremors. Hematologic: No lymphadenopathy, no easy bruising. Psychiatric: No confusion, no hallucinations, no sleep disturbance.  EKG shows atrial fibrillation with ventricular response 76.  No ischemic changes.  Physical Exam: Filed Vitals:   02/01/14 1057  BP: 150/60  Pulse: 76  The patient appears to be in no distress.  Head and neck exam reveals that the pupils are equal and reactive.  The extraocular movements are full.  There is no scleral icterus.  Mouth and pharynx are benign.  No lymphadenopathy.  No carotid bruits.  The jugular venous pressure is normal.  Thyroid is not enlarged or tender.  Chest is clear to percussion and auscultation.  No rales or rhonchi.  Expansion of the chest is symmetrical.  Heart reveals no abnormal lift or heave.  First and second heart sounds are normal.  The pulse is irregularly irregular. There is no murmur gallop rub or click.  The abdomen is soft and nontender.  Bowel sounds are normoactive.  There is no hepatosplenomegaly or mass.  There are no abdominal bruits.  Extremities reveal no phlebitis or edema.  Pedal pulses are good.  There is no cyanosis or clubbing.  Neurologic exam is normal strength and no lateralizing weakness.  No sensory deficits.  Integument reveals no rash    Assessment / Plan: 1.  Permanent atrial fibrillation 2. essential hypertension without congestive heart failure 3. Hypercholesterolemia 4. Hypothyroidism  Plan: Continue same medication.  Recheck in 4 months for office visit .

## 2014-02-01 NOTE — Assessment & Plan Note (Signed)
The patient has not been having any symptoms of heart failure.  No dizziness or syncope.  No palpitations.

## 2014-02-01 NOTE — Patient Instructions (Signed)
Your physician recommends that you continue on your current medications as directed. Please refer to the Current Medication list given to you today.  Your physician wants you to follow-up in: 4 month ov You will receive a reminder letter in the mail two months in advance. If you don't receive a letter, please call our office to schedule the follow-up appointment.  

## 2014-02-01 NOTE — Assessment & Plan Note (Signed)
She is clinically euthyroid on current dose of Synthroid.  Her thyroid function is followed by her PCP

## 2014-02-01 NOTE — Assessment & Plan Note (Signed)
The patient remains on long-term Coumadin.  She has had no TIA or stroke symptoms.  Unfortunately she did not hold her Coumadin prior to her extensive dental work and she had a lot of postoperative bleeding

## 2014-02-08 DIAGNOSIS — H903 Sensorineural hearing loss, bilateral: Secondary | ICD-10-CM | POA: Diagnosis not present

## 2014-02-08 DIAGNOSIS — J320 Chronic maxillary sinusitis: Secondary | ICD-10-CM | POA: Diagnosis not present

## 2014-02-15 DIAGNOSIS — K219 Gastro-esophageal reflux disease without esophagitis: Secondary | ICD-10-CM | POA: Diagnosis not present

## 2014-02-15 DIAGNOSIS — Z0000 Encounter for general adult medical examination without abnormal findings: Secondary | ICD-10-CM | POA: Diagnosis not present

## 2014-02-15 DIAGNOSIS — E11359 Type 2 diabetes mellitus with proliferative diabetic retinopathy without macular edema: Secondary | ICD-10-CM | POA: Diagnosis not present

## 2014-02-15 DIAGNOSIS — Z23 Encounter for immunization: Secondary | ICD-10-CM | POA: Diagnosis not present

## 2014-02-15 DIAGNOSIS — E039 Hypothyroidism, unspecified: Secondary | ICD-10-CM | POA: Diagnosis not present

## 2014-02-15 DIAGNOSIS — E1139 Type 2 diabetes mellitus with other diabetic ophthalmic complication: Secondary | ICD-10-CM | POA: Diagnosis not present

## 2014-02-15 DIAGNOSIS — E782 Mixed hyperlipidemia: Secondary | ICD-10-CM | POA: Diagnosis not present

## 2014-02-15 DIAGNOSIS — I10 Essential (primary) hypertension: Secondary | ICD-10-CM | POA: Diagnosis not present

## 2014-02-15 DIAGNOSIS — I4891 Unspecified atrial fibrillation: Secondary | ICD-10-CM | POA: Diagnosis not present

## 2014-02-17 ENCOUNTER — Encounter: Payer: Self-pay | Admitting: Cardiology

## 2014-03-01 ENCOUNTER — Ambulatory Visit (INDEPENDENT_AMBULATORY_CARE_PROVIDER_SITE_OTHER): Payer: Medicare Other | Admitting: Pharmacist

## 2014-03-01 DIAGNOSIS — I482 Chronic atrial fibrillation, unspecified: Secondary | ICD-10-CM

## 2014-03-01 DIAGNOSIS — Z5181 Encounter for therapeutic drug level monitoring: Secondary | ICD-10-CM

## 2014-03-01 DIAGNOSIS — Z7901 Long term (current) use of anticoagulants: Secondary | ICD-10-CM

## 2014-03-01 DIAGNOSIS — I4891 Unspecified atrial fibrillation: Secondary | ICD-10-CM | POA: Diagnosis not present

## 2014-03-01 LAB — POCT INR: INR: 2.2

## 2014-03-29 ENCOUNTER — Ambulatory Visit (INDEPENDENT_AMBULATORY_CARE_PROVIDER_SITE_OTHER): Payer: Medicare Other | Admitting: *Deleted

## 2014-03-29 DIAGNOSIS — Z5181 Encounter for therapeutic drug level monitoring: Secondary | ICD-10-CM | POA: Diagnosis not present

## 2014-03-29 DIAGNOSIS — I4891 Unspecified atrial fibrillation: Secondary | ICD-10-CM | POA: Diagnosis not present

## 2014-03-29 DIAGNOSIS — Z7901 Long term (current) use of anticoagulants: Secondary | ICD-10-CM | POA: Diagnosis not present

## 2014-03-29 LAB — POCT INR: INR: 2.3

## 2014-04-07 ENCOUNTER — Other Ambulatory Visit: Payer: Self-pay | Admitting: Cardiology

## 2014-04-18 DIAGNOSIS — E11329 Type 2 diabetes mellitus with mild nonproliferative diabetic retinopathy without macular edema: Secondary | ICD-10-CM | POA: Diagnosis not present

## 2014-04-18 DIAGNOSIS — H35343 Macular cyst, hole, or pseudohole, bilateral: Secondary | ICD-10-CM | POA: Diagnosis not present

## 2014-04-18 DIAGNOSIS — H35373 Puckering of macula, bilateral: Secondary | ICD-10-CM | POA: Diagnosis not present

## 2014-04-18 DIAGNOSIS — H3531 Nonexudative age-related macular degeneration: Secondary | ICD-10-CM | POA: Diagnosis not present

## 2014-05-01 ENCOUNTER — Ambulatory Visit (INDEPENDENT_AMBULATORY_CARE_PROVIDER_SITE_OTHER): Payer: Medicare Other | Admitting: Surgery

## 2014-05-01 DIAGNOSIS — Z5181 Encounter for therapeutic drug level monitoring: Secondary | ICD-10-CM

## 2014-05-01 DIAGNOSIS — I4891 Unspecified atrial fibrillation: Secondary | ICD-10-CM

## 2014-05-01 DIAGNOSIS — Z7901 Long term (current) use of anticoagulants: Secondary | ICD-10-CM

## 2014-05-01 LAB — POCT INR: INR: 2.4

## 2014-05-03 DIAGNOSIS — J310 Chronic rhinitis: Secondary | ICD-10-CM | POA: Diagnosis not present

## 2014-05-03 DIAGNOSIS — J320 Chronic maxillary sinusitis: Secondary | ICD-10-CM | POA: Diagnosis not present

## 2014-05-23 ENCOUNTER — Inpatient Hospital Stay (HOSPITAL_COMMUNITY)
Admission: EM | Admit: 2014-05-23 | Discharge: 2014-05-25 | DRG: 066 | Disposition: A | Discharge status: Home / Self Care | Payer: Medicare Other | Attending: Internal Medicine | Admitting: Internal Medicine

## 2014-05-23 ENCOUNTER — Emergency Department (HOSPITAL_COMMUNITY): Payer: Medicare Other

## 2014-05-23 ENCOUNTER — Encounter (HOSPITAL_COMMUNITY): Payer: Self-pay | Admitting: Emergency Medicine

## 2014-05-23 DIAGNOSIS — R2990 Unspecified symptoms and signs involving the nervous system: Secondary | ICD-10-CM

## 2014-05-23 DIAGNOSIS — I10 Essential (primary) hypertension: Secondary | ICD-10-CM | POA: Diagnosis present

## 2014-05-23 DIAGNOSIS — Z79899 Other long term (current) drug therapy: Secondary | ICD-10-CM

## 2014-05-23 DIAGNOSIS — Z7901 Long term (current) use of anticoagulants: Secondary | ICD-10-CM

## 2014-05-23 DIAGNOSIS — E785 Hyperlipidemia, unspecified: Secondary | ICD-10-CM | POA: Diagnosis present

## 2014-05-23 DIAGNOSIS — Z8673 Personal history of transient ischemic attack (TIA), and cerebral infarction without residual deficits: Secondary | ICD-10-CM | POA: Diagnosis present

## 2014-05-23 DIAGNOSIS — Z823 Family history of stroke: Secondary | ICD-10-CM

## 2014-05-23 DIAGNOSIS — E039 Hypothyroidism, unspecified: Secondary | ICD-10-CM | POA: Diagnosis not present

## 2014-05-23 DIAGNOSIS — E119 Type 2 diabetes mellitus without complications: Secondary | ICD-10-CM

## 2014-05-23 DIAGNOSIS — Z888 Allergy status to other drugs, medicaments and biological substances status: Secondary | ICD-10-CM

## 2014-05-23 DIAGNOSIS — I639 Cerebral infarction, unspecified: Secondary | ICD-10-CM

## 2014-05-23 DIAGNOSIS — G459 Transient cerebral ischemic attack, unspecified: Secondary | ICD-10-CM | POA: Diagnosis not present

## 2014-05-23 DIAGNOSIS — R4701 Aphasia: Secondary | ICD-10-CM | POA: Diagnosis not present

## 2014-05-23 DIAGNOSIS — Z8249 Family history of ischemic heart disease and other diseases of the circulatory system: Secondary | ICD-10-CM

## 2014-05-23 DIAGNOSIS — Z885 Allergy status to narcotic agent status: Secondary | ICD-10-CM

## 2014-05-23 DIAGNOSIS — R4781 Slurred speech: Secondary | ICD-10-CM

## 2014-05-23 DIAGNOSIS — I482 Chronic atrial fibrillation: Secondary | ICD-10-CM | POA: Diagnosis not present

## 2014-05-23 DIAGNOSIS — I6340 Cerebral infarction due to embolism of unspecified cerebral artery: Principal | ICD-10-CM | POA: Diagnosis present

## 2014-05-23 DIAGNOSIS — H6121 Impacted cerumen, right ear: Secondary | ICD-10-CM | POA: Diagnosis not present

## 2014-05-23 DIAGNOSIS — Z882 Allergy status to sulfonamides status: Secondary | ICD-10-CM

## 2014-05-23 DIAGNOSIS — H60331 Swimmer's ear, right ear: Secondary | ICD-10-CM | POA: Diagnosis not present

## 2014-05-23 DIAGNOSIS — I4821 Permanent atrial fibrillation: Secondary | ICD-10-CM | POA: Diagnosis present

## 2014-05-23 LAB — POCT I-STAT TROPONIN I: Troponin i, poc: 0 ng/mL (ref 0.00–0.08)

## 2014-05-23 LAB — URINALYSIS, ROUTINE W REFLEX MICROSCOPIC
BILIRUBIN URINE: NEGATIVE
Glucose, UA: NEGATIVE mg/dL
Hgb urine dipstick: NEGATIVE
Ketones, ur: NEGATIVE mg/dL
Leukocytes, UA: NEGATIVE
NITRITE: NEGATIVE
PH: 7.5 (ref 5.0–8.0)
Protein, ur: NEGATIVE mg/dL
Specific Gravity, Urine: 1.04 — ABNORMAL HIGH (ref 1.005–1.030)
UROBILINOGEN UA: 0.2 mg/dL (ref 0.0–1.0)

## 2014-05-23 LAB — COMPREHENSIVE METABOLIC PANEL
ALT: 13 U/L (ref 0–35)
AST: 17 U/L (ref 0–37)
Albumin: 3.7 g/dL (ref 3.5–5.2)
Alkaline Phosphatase: 109 U/L (ref 39–117)
Anion gap: 6 (ref 5–15)
BUN: 16 mg/dL (ref 6–23)
CO2: 28 mmol/L (ref 19–32)
Calcium: 9.1 mg/dL (ref 8.4–10.5)
Chloride: 104 mEq/L (ref 96–112)
Creatinine, Ser: 0.9 mg/dL (ref 0.50–1.10)
GFR calc Af Amer: 62 mL/min — ABNORMAL LOW (ref >90)
GFR calc non Af Amer: 53 mL/min — ABNORMAL LOW (ref >90)
GLUCOSE: 150 mg/dL — AB (ref 70–99)
Potassium: 4.5 mmol/L (ref 3.5–5.1)
Sodium: 138 mmol/L (ref 135–145)
TOTAL PROTEIN: 7 g/dL (ref 6.0–8.3)
Total Bilirubin: 0.4 mg/dL (ref 0.3–1.2)

## 2014-05-23 LAB — DIFFERENTIAL
Basophils Absolute: 0 10*3/uL (ref 0.0–0.1)
Basophils Relative: 1 % (ref 0–1)
EOS ABS: 0.1 10*3/uL (ref 0.0–0.7)
Eosinophils Relative: 2 % (ref 0–5)
Lymphocytes Relative: 21 % (ref 12–46)
Lymphs Abs: 1.4 10*3/uL (ref 0.7–4.0)
Monocytes Absolute: 0.8 10*3/uL (ref 0.1–1.0)
Monocytes Relative: 12 % (ref 3–12)
Neutro Abs: 4.2 10*3/uL (ref 1.7–7.7)
Neutrophils Relative %: 64 % (ref 43–77)

## 2014-05-23 LAB — APTT: aPTT: 40 seconds — ABNORMAL HIGH (ref 24–37)

## 2014-05-23 LAB — CBC
HCT: 38.6 % (ref 36.0–46.0)
Hemoglobin: 12.4 g/dL (ref 12.0–15.0)
MCH: 31.5 pg (ref 26.0–34.0)
MCHC: 32.1 g/dL (ref 30.0–36.0)
MCV: 98 fL (ref 78.0–100.0)
PLATELETS: 186 10*3/uL (ref 150–400)
RBC: 3.94 MIL/uL (ref 3.87–5.11)
RDW: 14.3 % (ref 11.5–15.5)
WBC: 6.5 10*3/uL (ref 4.0–10.5)

## 2014-05-23 LAB — CBG MONITORING, ED: Glucose-Capillary: 150 mg/dL — ABNORMAL HIGH (ref 70–99)

## 2014-05-23 LAB — PROTIME-INR
INR: 1.91 — AB (ref 0.00–1.49)
Prothrombin Time: 22.1 seconds — ABNORMAL HIGH (ref 11.6–15.2)

## 2014-05-23 LAB — GLUCOSE, CAPILLARY: Glucose-Capillary: 127 mg/dL — ABNORMAL HIGH (ref 70–99)

## 2014-05-23 MED ORDER — LORATADINE 10 MG PO TABS
10.0000 mg | ORAL_TABLET | Freq: Every day | ORAL | Status: DC | PRN
  Filled 2014-05-23: qty 1

## 2014-05-23 MED ORDER — LISINOPRIL 10 MG PO TABS
10.0000 mg | ORAL_TABLET | Freq: Every day | ORAL | Status: DC
  Administered 2014-05-24 – 2014-05-25 (×2): 10 mg via ORAL
  Filled 2014-05-23 (×2): qty 1

## 2014-05-23 MED ORDER — FLUTICASONE PROPIONATE 50 MCG/ACT NA SUSP
2.0000 | Freq: Every day | NASAL | Status: DC
  Administered 2014-05-24: 2 via NASAL
  Filled 2014-05-23: qty 16

## 2014-05-23 MED ORDER — LEVOTHYROXINE SODIUM 88 MCG PO TABS
88.0000 ug | ORAL_TABLET | Freq: Every day | ORAL | Status: DC
  Administered 2014-05-25: 88 ug via ORAL
  Filled 2014-05-23 (×3): qty 1

## 2014-05-23 MED ORDER — OFLOXACIN 0.3 % OP SOLN
5.0000 [drp] | Freq: Every day | OPHTHALMIC | Status: DC
  Administered 2014-05-23: 5 [drp] via OTIC
  Filled 2014-05-23: qty 5

## 2014-05-23 MED ORDER — INSULIN ASPART 100 UNIT/ML ~~LOC~~ SOLN
0.0000 [IU] | Freq: Three times a day (TID) | SUBCUTANEOUS | Status: DC
Start: 2014-05-24 — End: 2014-05-25
  Administered 2014-05-24 – 2014-05-25 (×3): 2 [IU] via SUBCUTANEOUS

## 2014-05-23 MED ORDER — SODIUM CHLORIDE 0.9 % IV SOLN
INTRAVENOUS | Status: AC
  Administered 2014-05-23 – 2014-05-24 (×2): via INTRAVENOUS

## 2014-05-23 MED ORDER — CIPROFLOXACIN-DEXAMETHASONE 0.3-0.1 % OT SUSP
4.0000 [drp] | Freq: Two times a day (BID) | OTIC | Status: DC
  Administered 2014-05-23 – 2014-05-25 (×4): 4 [drp] via OTIC
  Filled 2014-05-23: qty 7.5

## 2014-05-23 MED ORDER — STROKE: EARLY STAGES OF RECOVERY BOOK
Freq: Once | Status: AC
  Administered 2014-05-23: 1
  Filled 2014-05-23: qty 1

## 2014-05-23 MED ORDER — ACETAMINOPHEN 500 MG PO TABS
1000.0000 mg | ORAL_TABLET | Freq: Once | ORAL | Status: AC
  Administered 2014-05-23: 1000 mg via ORAL
  Filled 2014-05-23: qty 2

## 2014-05-23 MED ORDER — IOHEXOL 350 MG/ML SOLN
80.0000 mL | Freq: Once | INTRAVENOUS | Status: AC | PRN
  Administered 2014-05-23: 80 mL via INTRAVENOUS

## 2014-05-23 MED ORDER — SALINE SPRAY 0.65 % NA SOLN
1.0000 | NASAL | Status: DC | PRN
  Filled 2014-05-23: qty 44

## 2014-05-23 MED ORDER — GLIMEPIRIDE 1 MG PO TABS
1.5000 mg | ORAL_TABLET | Freq: Every day | ORAL | Status: DC
  Administered 2014-05-24 – 2014-05-25 (×2): 1.5 mg via ORAL
  Filled 2014-05-23 (×3): qty 1.5

## 2014-05-23 MED ORDER — ACETAMINOPHEN 325 MG PO TABS: 650.0000 mg | ORAL_TABLET | ORAL | Status: DC | PRN

## 2014-05-23 NOTE — ED Notes (Signed)
Pt returned from CT.  Pt alert and oriented x's 3.  Family at bedside.

## 2014-05-23 NOTE — Code Documentation (Signed)
Patient saw doctor earlier today because she had sever ear pain and bleeding from her ear.  She was last known well at 12:30 today.  At 16:30 today family noted that she had trouble speaking.  They brought her to the emergency room today at 18:17.  Code stroke called at 18:46, head CT done.  CTA head and neck done.  NIHSS 3, mild aphasia and dysarthria, right leg drift.  Dr Janann Colonel at bedside to assess patient while in [place].  Dr Doy Mince assessed patient once patient returned to ED.  Son at bedside.

## 2014-05-23 NOTE — ED Notes (Addendum)
Spoke to Dr. Roderic Palau regarding pt presentation. Advised to call CODE STROKE.

## 2014-05-23 NOTE — Progress Notes (Signed)
Spoke with ED nurse about pt's neuro checks not showing up in system.

## 2014-05-23 NOTE — ED Provider Notes (Signed)
CSN: [id number]     Arrival date & time 05/23/14  1817 History   First MD Initiated Contact with Patient 05/23/14 1846     Chief Complaint  Patient presents with  . Aphasia     (Consider location/radiation/quality/duration/timing/severity/associated sxs/prior Treatment) Patient is a 81 y.o. female presenting with neurologic complaint.  Neurologic Problem This is a new problem. The current episode started today. The problem occurs constantly. The problem has been resolved. Associated symptoms include weakness (right sided (pt unclear on hx, unable to open door, said maybe she was weak in R leg-was weak on itial exam)). Pertinent negatives include no abdominal pain, chest pain, congestion, coughing, fever, headaches, nausea, neck pain, numbness, rash, sore throat, vertigo, visual change or vomiting. Nothing aggravates the symptoms. She has tried nothing for the symptoms. Improvement on treatment: spontaneous relief.    Past Medical History  Diagnosis Date  . Chronic atrial fibrillation   . Chronic anticoagulation   . Hypothyroidism   . Sinus congestion   . Carpal tunnel syndrome   . Diabetes mellitus     NON-INSULIN   Past Surgical History  Procedure Laterality Date  . Cholecystectomy    . Appendectomy    . US echocardiography  12/06/2007    EF 55-60%  . Cardiovascular stress test  07/10/2004    EF 81%   Family History  Problem Relation Age of Onset  . Stroke Mother   . Diabetes Sister   . Heart attack Neg Hx   . Hypertension    . Hypertension Mother    History  Substance Use Topics  . Smoking status: Never Smoker   . Smokeless tobacco: Not on file  . Alcohol Use: No   OB History    No data available     Review of Systems  Constitutional: Negative for fever.  HENT: Positive for ear pain (right ear (hx clot/scabbing/treatment for otitis externa initiated by PCP)). Negative for congestion and sore throat.   Eyes: Negative for visual disturbance.  Respiratory: Negative  for cough and shortness of breath.   Cardiovascular: Negative for chest pain.  Gastrointestinal: Negative for nausea, vomiting, abdominal pain, diarrhea, constipation and blood in stool.  Genitourinary: Negative for difficulty urinating.  Musculoskeletal: Negative for back pain and neck pain.  Skin: Negative for rash.  Neurological: Positive for speech difficulty and weakness (right sided (pt unclear on hx, unable to open door, said maybe she was weak in R leg-was weak on itial exam)). Negative for dizziness, vertigo, syncope, facial asymmetry, numbness and headaches.      Allergies  Codeine; Hydrocodone; Metoprolol; Rythmol; and Sulfa antibiotics  Home Medications   Prior to Admission medications   Medication Sig Start Date End Date Taking? Authorizing Provider  acetaminophen (TYLENOL) 500 MG tablet Take 500 mg by mouth every 6 (six) hours as needed for mild pain.    Historical Provider, MD  fluticasone (FLONASE) 50 MCG/ACT nasal spray Place 2 sprays into the nose daily.     Historical Provider, MD  glimepiride (AMARYL) 1 MG tablet 1.5 mg daily with breakfast.     Historical Provider, MD  levothyroxine (SYNTHROID, LEVOTHROID) 88 MCG tablet Take 88 mcg by mouth daily.      Historical Provider, MD  lisinopril (PRINIVIL,ZESTRIL) 10 MG tablet Take 10 mg by mouth daily.      Historical Provider, MD  loratadine (CLARITIN) 10 MG tablet Take 10 mg by mouth daily as needed.     Historical Provider, MD  Multiple Vitamins-Minerals (PRESERVISION AREDS  2 PO) Take 1 capsule by mouth 2 (two) times daily.     Historical Provider, MD  Polyethyl Glycol-Propyl Glycol (SYSTANE OP) Apply 1 drop to eye 3 (three) times daily as needed (dry eyes).     Historical Provider, MD  Probiotic Product (PROBIOTIC DAILY PO) Take 2 tablets by mouth daily.     Historical Provider, MD  sodium chloride (OCEAN) 0.65 % SOLN nasal spray Place 1 spray into both nostrils as needed for congestion (SIMPLY SALINE).     Historical  Provider, MD  warfarin (COUMADIN) 5 MG tablet Take as directed by Coumadin clinic 04/07/14   Darlin Coco, MD   BP 161/95 mmHg  Pulse 90  Temp(Src) 99 F (37.2 C) (Oral)  Resp 21  SpO2 100% Physical Exam  Constitutional: She is oriented to person, place, and time. She appears well-developed and well-nourished. No distress.  HENT:  Head: Normocephalic and atraumatic.  L TM WNL R ear with wick in place per PCP  Eyes: Conjunctivae and EOM are normal.  Neck: Normal range of motion.  Cardiovascular: Normal rate, normal heart sounds and intact distal pulses.  An irregularly irregular rhythm present. Exam reveals no gallop and no friction rub.   No murmur heard. Pulmonary/Chest: Effort normal and breath sounds normal. No respiratory distress. She has no wheezes. She has no rales.  Abdominal: Soft. She exhibits no distension. There is no tenderness. There is no guarding.  Musculoskeletal: She exhibits no edema or tenderness.  Neurological: She is alert and oriented to person, place, and time. She has normal strength. No cranial nerve deficit or sensory deficit. Coordination normal. GCS eye subscore is 4. GCS verbal subscore is 5. GCS motor subscore is 6.  Skin: Skin is warm and dry. No rash noted. She is not diaphoretic. No erythema.  Nursing note and vitals reviewed.   ED Course  Procedures (including critical care time) Labs Review Labs Reviewed  PROTIME-INR - Abnormal; Notable for the following:    Prothrombin Time 22.1 (*)    INR 1.91 (*)    All other components within normal limits  APTT - Abnormal; Notable for the following:    aPTT 40 (*)    All other components within normal limits  COMPREHENSIVE METABOLIC PANEL - Abnormal; Notable for the following:    Glucose, Bld 150 (*)    GFR calc non Af Amer 53 (*)    GFR calc Af Amer 62 (*)    All other components within normal limits  CBG MONITORING, ED - Abnormal; Notable for the following:    Glucose-Capillary 150 (*)    All  other components within normal limits  URINE CULTURE  CBC  DIFFERENTIAL  URINALYSIS, ROUTINE W REFLEX MICROSCOPIC  I-STAT TROPOININ, ED    Imaging Review Ct Angio Head W/cm &/or Wo Cm  05/23/2014   CLINICAL DATA:  Expressive aphasia. Last seen normal 12:30 p.m. today.  EXAM: CT ANGIOGRAPHY HEAD AND NECK  TECHNIQUE: Multidetector CT imaging of the head and neck was performed using the standard protocol during bolus administration of intravenous contrast. Multiplanar CT image reconstructions and MIPs were obtained to evaluate the vascular anatomy. Carotid stenosis measurements (when applicable) are obtained utilizing NASCET criteria, using the distal internal carotid diameter as the denominator.  CONTRAST:  32mL OMNIPAQUE IOHEXOL 350 MG/ML SOLN  COMPARISON:  MR head 09/24/2010.  FINDINGS: CT HEAD  Calvarium and skull base: No fracture or destructive lesion. Mastoids and middle ears are grossly clear.  Paranasal sinuses: Imaged portions are  clear.  Orbits: Negative.  Brain: No evidence of acute abnormality, including acute infarct, hemorrhage, hydrocephalus, or mass lesion. Normal for age cerebral volume. Mild white matter disease.  CTA NECK  Aortic arch: Standard branching. Imaged portion shows no evidence of aneurysm or dissection. No significant stenosis of the major arch vessel origins.  Right carotid system: Minor atheromatous change. No evidence of dissection, stenosis (50% or greater) or occlusion.  Left carotid system: Minor atheromatous change. No evidence of dissection, stenosis (50% or greater) or occlusion.  Vertebral arteries: Codominant. No evidence of dissection, stenosis (50% or greater) or occlusion.  SOFT TISSUES: Spondylosis. Mild vascular congestion at the lung apices without pneumothorax or mass. No pneumothorax.  CTA HEAD  Anterior circulation: Minor atheromatous change in the carotid siphons. No significant stenosis, proximal occlusion, aneurysm, or vascular malformation.  Posterior  circulation: No significant stenosis, proximal occlusion, aneurysm, or vascular malformation.  Venous sinuses: As permitted by contrast timing, patent.  Anatomic variants: None of significance.  Delayed phase:   No abnormal intracranial enhancement.  Critical Value/emergent results were called by telephone at the time of interpretation on 05/23/2014 at 7:15 pm to Dr. Alexis Goodell, who verbally acknowledged these results.  IMPRESSION: No evidence for large vessel occlusion.  Minor atheromatous change without extracranial or intracranial flow reducing lesion.  No evidence for LEFT MCA branch occlusion, or intracranial hemorrhage.  Normal for age cerebral volume without significant changes of chronic ischemia.   Electronically Signed   By: Rolla Flatten M.D.   On: 05/23/2014 19:37   Ct Angio Neck W/cm &/or Wo/cm  05/23/2014   CLINICAL DATA:  Expressive aphasia. Last seen normal 12:30 p.m. today.  EXAM: CT ANGIOGRAPHY HEAD AND NECK  TECHNIQUE: Multidetector CT imaging of the head and neck was performed using the standard protocol during bolus administration of intravenous contrast. Multiplanar CT image reconstructions and MIPs were obtained to evaluate the vascular anatomy. Carotid stenosis measurements (when applicable) are obtained utilizing NASCET criteria, using the distal internal carotid diameter as the denominator.  CONTRAST:  35mL OMNIPAQUE IOHEXOL 350 MG/ML SOLN  COMPARISON:  MR head 09/24/2010.  FINDINGS: CT HEAD  Calvarium and skull base: No fracture or destructive lesion. Mastoids and middle ears are grossly clear.  Paranasal sinuses: Imaged portions are clear.  Orbits: Negative.  Brain: No evidence of acute abnormality, including acute infarct, hemorrhage, hydrocephalus, or mass lesion. Normal for age cerebral volume. Mild white matter disease.  CTA NECK  Aortic arch: Standard branching. Imaged portion shows no evidence of aneurysm or dissection. No significant stenosis of the major arch vessel origins.   Right carotid system: Minor atheromatous change. No evidence of dissection, stenosis (50% or greater) or occlusion.  Left carotid system: Minor atheromatous change. No evidence of dissection, stenosis (50% or greater) or occlusion.  Vertebral arteries: Codominant. No evidence of dissection, stenosis (50% or greater) or occlusion.  SOFT TISSUES: Spondylosis. Mild vascular congestion at the lung apices without pneumothorax or mass. No pneumothorax.  CTA HEAD  Anterior circulation: Minor atheromatous change in the carotid siphons. No significant stenosis, proximal occlusion, aneurysm, or vascular malformation.  Posterior circulation: No significant stenosis, proximal occlusion, aneurysm, or vascular malformation.  Venous sinuses: As permitted by contrast timing, patent.  Anatomic variants: None of significance.  Delayed phase:   No abnormal intracranial enhancement.  Critical Value/emergent results were called by telephone at the time of interpretation on 05/23/2014 at 7:15 pm to Dr. Alexis Goodell, who verbally acknowledged these results.  IMPRESSION: No evidence for large  vessel occlusion.  Minor atheromatous change without extracranial or intracranial flow reducing lesion.  No evidence for LEFT MCA branch occlusion, or intracranial hemorrhage.  Normal for age cerebral volume without significant changes of chronic ischemia.   Electronically Signed   By: Rolla Flatten M.D.   On: 05/23/2014 19:37     EKG Interpretation   Date/Time:  Tuesday May 23 2014 19:16:52 EST Ventricular Rate:  69 PR Interval:    QRS Duration: 78 QT Interval:  372 QTC Calculation: 398 R Axis:   99 Text Interpretation:  Atrial fibrillation No significant change since last  tracing Confirmed by Ashok Cordia  MD, Lennette Bihari ([id number]) on 05/23/2014 7:20:54 PM      MDM   Final diagnoses:  Slurred speech  Stroke   81 year old female with history of atrial fibrillation on coumadin, DM, hypothyroidism presents with episode of aphasia present  on waking and lasting approximately 1 hour.  Patient was Code Stroke and on initial evaluation scored 3 on NIH Stroke Scale, however after CT Head returned to baseline.  History most consistent with TIA.  Glucose WNL, no symptoms of infection. Pt admitted to hospitalist for observation and further TIA work up.      Alvino Chapel, MD 05/24/14 [id number]  Mirna Mires, MD 05/24/14 [phone]

## 2014-05-23 NOTE — ED Notes (Addendum)
Pt family reports at 5 today pt started having difficulty saying what she wants to say. Last seen normal at 1230 today. Grip strength equal. No facial droop. Pt seen this am to be evaluated for bleeding in ear. Pt c.o pain to L ear. Pt hx of a-fib and on coumadin.

## 2014-05-23 NOTE — ED Notes (Signed)
Dr. Doy Mince in to assess pt and talk to family members.

## 2014-05-23 NOTE — Progress Notes (Signed)
ANTICOAGULATION CONSULT NOTE - Initial Consult  Pharmacy Consult for Coumadin Indication: atrial fibrillation  Allergies  Allergen Reactions  . Codeine   . Hydrocodone     CAUSES  NAUSEA  . Metoprolol   . Rythmol [Propafenone Hcl]     dizzy  . Sulfa Antibiotics     Patient Measurements: Height: 5\' 4"  (162.6 cm) Weight: 171 lb 9.6 oz (77.837 kg) IBW/kg (Calculated) : 54.7  Vital Signs: Temp: 99.2 F (37.3 C) (01/05 2228) Temp Source: Oral (01/05 2228) BP: 107/54 mmHg (01/05 2228) Pulse Rate: 62 (01/05 2228)  Labs:  Recent Labs  05/23/14 1848  HGB 12.4  HCT 38.6  PLT 186  APTT 40*  LABPROT 22.1*  INR 1.91*  CREATININE 0.90    Estimated Creatinine Clearance: 39.4 mL/min (by C-G formula based on Cr of 0.9).   Medical History: Past Medical History  Diagnosis Date  . Chronic atrial fibrillation   . Chronic anticoagulation   . Hypothyroidism   . Sinus congestion   . Carpal tunnel syndrome   . Diabetes mellitus     NON-INSULIN    Medications:  Prescriptions prior to admission  Medication Sig Dispense Refill Last Dose  . acetaminophen (TYLENOL) 500 MG tablet Take 1,000 mg by mouth every 6 (six) hours as needed for mild pain.    05/23/2014 at Unknown time  . fluticasone (FLONASE) 50 MCG/ACT nasal spray Place 2 sprays into the nose daily.    Past Week at Unknown time  . glimepiride (AMARYL) 1 MG tablet 1.5 mg daily with breakfast.    05/23/2014 at Unknown time  . levothyroxine (SYNTHROID, LEVOTHROID) 88 MCG tablet Take 88 mcg by mouth daily.     05/23/2014 at Unknown time  . lisinopril (PRINIVIL,ZESTRIL) 10 MG tablet Take 10 mg by mouth daily.     05/23/2014 at Unknown time  . loratadine (CLARITIN) 10 MG tablet Take 10 mg by mouth daily as needed for allergies.    Past Month at Unknown time  . Multiple Vitamins-Minerals (PRESERVISION AREDS 2 PO) Take 1 capsule by mouth 2 (two) times daily.    05/22/2014 at Unknown time  . Polyethyl Glycol-Propyl Glycol (SYSTANE OP) Apply  1 drop to eye 3 (three) times daily as needed (dry eyes).    Past Week at Unknown time  . Probiotic Product (PROBIOTIC DAILY PO) Take 2 tablets by mouth daily.    05/22/2014 at Unknown time  . sodium chloride (OCEAN) 0.65 % SOLN nasal spray Place 1 spray into both nostrils as needed for congestion (SIMPLY SALINE).    05/23/2014 at Unknown time  . warfarin (COUMADIN) 5 MG tablet Take as directed by Coumadin clinic (Patient taking differently: Take 5 mg by mouth See admin instructions. Take as directed by Coumadin clinic. Take a whole tablet every day except on Tuesday and fridays take 7.5mg  take a whole 5mg  tablet and split it in half to make 7.5mg ) 40 tablet 3 05/23/2014 at Unknown time    Assessment: 81 yo F presented to ED with trouble speaking and understanding speech.  A Code [place] was activated but pt was deemed not a candidate for tPA (symptom resolution, Coumadin PTA).  Head CT shows no acute changes.  Pt was evaluated by Neurology and ok to continue Coumadin.  INR 1.91  Home Coumadin dose: 5mg  daily except 7.5mg  on Tues and Friday.  Last dose today.  Goal of Therapy:  INR 2-3 Monitor platelets by anticoagulation protocol: Yes   Plan:  INR slightly subtherapeutic but  pt did take increased (7.5mg ) Coumadin dose already today.  Will not give any further Coumadin tonight.  Daily INR.  [employer], Pharm.D., BCPS Clinical Pharmacist Pager [phone] 05/23/2014 10:46 PM

## 2014-05-23 NOTE — ED Notes (Signed)
Pt to CT. Neurologist at bedside.

## 2014-05-23 NOTE — Consult Note (Addendum)
Referring Physician: Ashok Cordia    Chief Complaint: Difficulty with speech  HPI: Amanda [name] is an 81 y.o. female who saw a physician this morning due to a painful ear.  Patient was given ear drops and returned home to take a nap.  She was at baseline at that time.  When she awakened at 29 she had trouble speaking and understanding speech.  Patient was brought to the ED at that time where a right leg drift was noted as well.  Code stroke was called at that time.  Initial NIHSS of 3.    Date last known well: Date: 05/23/2014 Time last known well: Time: 12:30 tPA Given: No: Resolution of symptoms, patient on Coumadin  Past Medical History  Diagnosis Date  . Chronic atrial fibrillation   . Chronic anticoagulation   . Hypothyroidism   . Sinus congestion   . Carpal tunnel syndrome   . Diabetes mellitus     NON-INSULIN    Past Surgical History  Procedure Laterality Date  . Cholecystectomy    . Appendectomy    . US echocardiography  12/06/2007    EF 55-60%  . Cardiovascular stress test  07/10/2004    EF 81%    Family History  Problem Relation Age of Onset  . Stroke Mother   . Diabetes Sister   . Heart attack Neg Hx   . Hypertension    . Hypertension Mother    Social History:  reports that she has never smoked. She does not have any smokeless tobacco history on file. She reports that she does not drink alcohol or use illicit drugs.  Allergies:  Allergies  Allergen Reactions  . Codeine   . Hydrocodone     CAUSES  NAUSEA  . Metoprolol   . Rythmol [Propafenone Hcl]     dizzy  . Sulfa Antibiotics     Medications: I have reviewed the patient's current medications. Prior to Admission:  Current outpatient prescriptions:  acetaminophen (TYLENOL) 500 MG tablet, Take 500 mg by mouth every 6 (six) hours as needed for mild pain., Disp: , Rfl: ;   fluticasone (FLONASE) 50 MCG/ACT nasal spray, Place 2 sprays into the nose daily. , Disp: , Rfl: ;   glimepiride (AMARYL) 1 MG  tablet, 1.5 mg daily with breakfast. , Disp: , Rfl: ;   levothyroxine (SYNTHROID, LEVOTHROID) 88 MCG tablet, Take 88 mcg by mouth daily.  , Disp: , Rfl:  lisinopril (PRINIVIL,ZESTRIL) 10 MG tablet, Take 10 mg by mouth daily.  , Disp: , Rfl: ;   loratadine (CLARITIN) 10 MG tablet, Take 10 mg by mouth daily as needed. , Disp: , Rfl: ;   Multiple Vitamins-Minerals (PRESERVISION AREDS 2 PO), Take 1 capsule by mouth 2 (two) times daily. , Disp: , Rfl: ;   Polyethyl Glycol-Propyl Glycol (SYSTANE OP), Apply 1 drop to eye 3 (three) times daily as needed (dry eyes). , Disp: , Rfl:  Probiotic Product (PROBIOTIC DAILY PO), Take 2 tablets by mouth daily. , Disp: , Rfl: ;   sodium chloride (OCEAN) 0.65 % SOLN nasal spray, Place 1 spray into both nostrils as needed for congestion (SIMPLY SALINE). , Disp: , Rfl: ;   warfarin (COUMADIN) 5 MG tablet, Take as directed by Coumadin clinic, Disp: 40 tablet, Rfl: 3  ROS: History obtained from the patient  General ROS: negative for - chills, fatigue, fever, night sweats, weight gain or weight loss Psychological ROS: negative for - behavioral disorder, hallucinations, memory difficulties, mood swings or suicidal  ideation Ophthalmic ROS: negative for - blurry vision, double vision, eye pain or loss of vision ENT ROS: ear pain, HOH Allergy and Immunology ROS: negative for - hives or itchy/watery eyes Hematological and Lymphatic ROS: negative for - bleeding problems, bruising or swollen lymph nodes Endocrine ROS: negative for - galactorrhea, hair pattern changes, polydipsia/polyuria or temperature intolerance Respiratory ROS: negative for - cough, hemoptysis, shortness of breath or wheezing Cardiovascular ROS: negative for - chest pain, dyspnea on exertion, edema or irregular heartbeat Gastrointestinal ROS: negative for - abdominal pain, diarrhea, hematemesis, nausea/vomiting or stool incontinence Genito-Urinary ROS: negative for - dysuria, hematuria, incontinence or  urinary frequency/urgency Musculoskeletal ROS: negative for - joint swelling or muscular weakness Neurological ROS: as noted in HPI Dermatological ROS: negative for rash and skin lesion changes  Physical Examination: Blood pressure 192/101, pulse 73, temperature 97.8 F (36.6 C), temperature source Oral, resp. rate 18, SpO2 97 %.  HEENT-  Normocephalic, no lesions, without obvious abnormality.  Normal external eye and conjunctiva.  Normal TM's bilaterally.  Normal auditory canals and external ears. Normal external nose, mucus membranes and septum.  Normal pharynx. Cardiovascular- S1, S2 normal, pulses palpable throughout   Lungs- chest clear, no wheezing, rales, normal symmetric air entry Abdomen- soft, non-tender; bowel sounds normal; no masses,  no organomegaly Extremities- no edema Lymph-no adenopathy palpable Musculoskeletal-no joint tenderness, deformity or swelling Skin-warm and dry, no hyperpigmentation, vitiligo, or suspicious lesions  Neurological Examination Mental Status: Alert, oriented.  Perseverates on ear pain.  Speech fluent without evidence of aphasia.  Able to follow simple commands without difficulty.  Often needs some reinforcement for 3-step commands. Cranial Nerves: II: Discs flat bilaterally; Visual fields grossly normal, pupils equal, round, minimally reactive to light and accommodation III,IV, VI: ptosis not present, extra-ocular motions intact bilaterally V,VII: smile symmetric with mild decrease in left NLF, facial light touch sensation normal bilaterally VIII: hard of hearing bilaterally IX,X: gag reflex present XI: bilateral shoulder shrug XII: midline tongue extension Motor: Right : Upper extremity   5/5    Left:     Upper extremity   5/5  Lower extremity   5/5     Lower extremity   5/5 Tone and bulk:normal tone throughout; no atrophy noted Sensory: Pinprick and light touch intact throughout, bilaterally Deep Tendon Reflexes: 2+ and symmetric with  absent AJ's bilaterally Plantars: Right: downgoing   Left: downgoing Cerebellar: normal finger-to-nose and normal heel-to-shin testing bilaterally   Laboratory Studies:  Basic Metabolic Panel:  Recent Labs Lab 05/23/14 1848  NA 138  K 4.5  CL 104  CO2 28  GLUCOSE 150*  BUN 16  CREATININE 0.90  CALCIUM 9.1    Liver Function Tests:  Recent Labs Lab 05/23/14 1848  AST 17  ALT 13  ALKPHOS 109  BILITOT 0.4  PROT 7.0  ALBUMIN 3.7   No results for input(s): LIPASE, AMYLASE in the last 168 hours. No results for input(s): AMMONIA in the last 168 hours.  CBC:  Recent Labs Lab 05/23/14 1848  WBC 6.5  NEUTROABS 4.2  HGB 12.4  HCT 38.6  MCV 98.0  PLT 186    Cardiac Enzymes: No results for input(s): CKTOTAL, CKMB, CKMBINDEX, TROPONINI in the last 168 hours.  BNP: Invalid input(s): POCBNP  CBG:  Recent Labs Lab 05/23/14 1828  GLUCAP 150*    Microbiology: No results found for this or any previous visit.  Coagulation Studies:  Recent Labs  05/23/14 1848  LABPROT 22.1*  INR 1.91*    Urinalysis:  No results for input(s): COLORURINE, LABSPEC, PHURINE, GLUCOSEU, HGBUR, BILIRUBINUR, KETONESUR, PROTEINUR, UROBILINOGEN, NITRITE, LEUKOCYTESUR in the last 168 hours.  Invalid input(s): APPERANCEUR  Lipid Panel:    Component Value Date/Time   CHOL 213* 01/06/2012 1211   TRIG 150.0* 01/06/2012 1211   HDL 53.00 01/06/2012 1211   CHOLHDL 4 01/06/2012 1211   VLDL 30.0 01/06/2012 1211    HgbA1C:  Lab Results  Component Value Date   HGBA1C 7.0* 01/06/2012    Urine Drug Screen:  No results found for: LABOPIA, COCAINSCRNUR, LABBENZ, AMPHETMU, THCU, LABBARB  Alcohol Level: No results for input(s): ETH in the last 168 hours.  Other results: EKG: atrial fibrillation, rate 69.  Imaging: Ct Angio Head W/cm &/or Wo Cm  05/23/2014   CLINICAL DATA:  Expressive aphasia. Last seen normal 12:30 p.m. today.  EXAM: CT ANGIOGRAPHY HEAD AND NECK  TECHNIQUE:  Multidetector CT imaging of the head and neck was performed using the standard protocol during bolus administration of intravenous contrast. Multiplanar CT image reconstructions and MIPs were obtained to evaluate the vascular anatomy. Carotid stenosis measurements (when applicable) are obtained utilizing NASCET criteria, using the distal internal carotid diameter as the denominator.  CONTRAST:  9mL OMNIPAQUE IOHEXOL 350 MG/ML SOLN  COMPARISON:  MR head 09/24/2010.  FINDINGS: CT HEAD  Calvarium and skull base: No fracture or destructive lesion. Mastoids and middle ears are grossly clear.  Paranasal sinuses: Imaged portions are clear.  Orbits: Negative.  Brain: No evidence of acute abnormality, including acute infarct, hemorrhage, hydrocephalus, or mass lesion. Normal for age cerebral volume. Mild white matter disease.  CTA NECK  Aortic arch: Standard branching. Imaged portion shows no evidence of aneurysm or dissection. No significant stenosis of the major arch vessel origins.  Right carotid system: Minor atheromatous change. No evidence of dissection, stenosis (50% or greater) or occlusion.  Left carotid system: Minor atheromatous change. No evidence of dissection, stenosis (50% or greater) or occlusion.  Vertebral arteries: Codominant. No evidence of dissection, stenosis (50% or greater) or occlusion.  SOFT TISSUES: Spondylosis. Mild vascular congestion at the lung apices without pneumothorax or mass. No pneumothorax.  CTA HEAD  Anterior circulation: Minor atheromatous change in the carotid siphons. No significant stenosis, proximal occlusion, aneurysm, or vascular malformation.  Posterior circulation: No significant stenosis, proximal occlusion, aneurysm, or vascular malformation.  Venous sinuses: As permitted by contrast timing, patent.  Anatomic variants: None of significance.  Delayed phase:   No abnormal intracranial enhancement.  Critical Value/emergent results were called by telephone at the time of  interpretation on 05/23/2014 at 7:15 pm to Dr. Alexis Goodell, who verbally acknowledged these results.  IMPRESSION: No evidence for large vessel occlusion.  Minor atheromatous change without extracranial or intracranial flow reducing lesion.  No evidence for LEFT MCA branch occlusion, or intracranial hemorrhage.  Normal for age cerebral volume without significant changes of chronic ischemia.   Electronically Signed   By: Rolla Flatten M.D.   On: 05/23/2014 19:37   Ct Angio Neck W/cm &/or Wo/cm  05/23/2014   CLINICAL DATA:  Expressive aphasia. Last seen normal 12:30 p.m. today.  EXAM: CT ANGIOGRAPHY HEAD AND NECK  TECHNIQUE: Multidetector CT imaging of the head and neck was performed using the standard protocol during bolus administration of intravenous contrast. Multiplanar CT image reconstructions and MIPs were obtained to evaluate the vascular anatomy. Carotid stenosis measurements (when applicable) are obtained utilizing NASCET criteria, using the distal internal carotid diameter as the denominator.  CONTRAST:  50mL OMNIPAQUE IOHEXOL 350 MG/ML  SOLN  COMPARISON:  MR head 09/24/2010.  FINDINGS: CT HEAD  Calvarium and skull base: No fracture or destructive lesion. Mastoids and middle ears are grossly clear.  Paranasal sinuses: Imaged portions are clear.  Orbits: Negative.  Brain: No evidence of acute abnormality, including acute infarct, hemorrhage, hydrocephalus, or mass lesion. Normal for age cerebral volume. Mild white matter disease.  CTA NECK  Aortic arch: Standard branching. Imaged portion shows no evidence of aneurysm or dissection. No significant stenosis of the major arch vessel origins.  Right carotid system: Minor atheromatous change. No evidence of dissection, stenosis (50% or greater) or occlusion.  Left carotid system: Minor atheromatous change. No evidence of dissection, stenosis (50% or greater) or occlusion.  Vertebral arteries: Codominant. No evidence of dissection, stenosis (50% or greater) or  occlusion.  SOFT TISSUES: Spondylosis. Mild vascular congestion at the lung apices without pneumothorax or mass. No pneumothorax.  CTA HEAD  Anterior circulation: Minor atheromatous change in the carotid siphons. No significant stenosis, proximal occlusion, aneurysm, or vascular malformation.  Posterior circulation: No significant stenosis, proximal occlusion, aneurysm, or vascular malformation.  Venous sinuses: As permitted by contrast timing, patent.  Anatomic variants: None of significance.  Delayed phase:   No abnormal intracranial enhancement.  Critical Value/emergent results were called by telephone at the time of interpretation on 05/23/2014 at 7:15 pm to Dr. Alexis Goodell, who verbally acknowledged these results.  IMPRESSION: No evidence for large vessel occlusion.  Minor atheromatous change without extracranial or intracranial flow reducing lesion.  No evidence for LEFT MCA branch occlusion, or intracranial hemorrhage.  Normal for age cerebral volume without significant changes of chronic ischemia.   Electronically Signed   By: Rolla Flatten M.D.   On: 05/23/2014 19:37    Assessment: 81 y.o. female with a history of atrial fibrillation on Coumadin who presents with an episode of aphasia and right lower extremity weakness.  Symptoms have now resolved therefore patient not a tPA or intervention candidate.  INR 1.91.  Head CT personally reviewed and shows no acute changes.  CTA of the head and neck show no evidence of large vessel significant stenosis.  TIA likely.  Overnight observation recommended.   Stroke Risk Factors - atrial fibrillation and diabetes mellitus  Plan: 1. HgbA1c, fasting lipid panel 2. Echocardiogram 3. Prophylactic therapy-Continue Coumadin 4. NPO until RN stroke swallow screen 5. Telemetry monitoring 6. Frequent neuro checks 7. PT/OT consults 8. MRI of the brain without contrast  Case discussed with Dr. Barbaraann Share, MD [facility] [phone] 05/23/2014, 8:11 PM

## 2014-05-23 NOTE — H&P (Signed)
[facility] History and Physical  Amanda [name] DJM:[id number] DOB: March 20, 1919 DOA: 05/23/2014  Referring physician: ER physician. PCP: Mayra Neer, MD   Chief Complaint: Difficulty speaking.  HPI: Amanda [name] is a 81 y.o. female and history of chronic atrial fibrillation, diabetes mellitus type 2, hypertension, hypothyroidism was brought to the ER after patient was found to have difficulty speaking and understanding around 4:30 PM. Patient was brought to the ER and was found to have some right leg drifting and code stroke was called. CT angiogram of the head and neck did not show anything acute. Dr. Doy Mince on call neurologist at this time felt patient was not a candidate for TPA since patient's symptoms have resolved. Patient had gone to ENT surgeon today morning and had a procedure done to the right ear because it was hurting and patient was placed on antibiotic drops. Patient had drops in the ER after patient was complaining of some discomfort and at this time patient feels the discomfort is gone. Patient denies any dizziness nausea vomiting headache or any visual symptoms. On exam patient is able to move all extremities.   Review of Systems: As presented in the history of presenting illness, rest negative.  Past Medical History  Diagnosis Date  . Chronic atrial fibrillation   . Chronic anticoagulation   . Hypothyroidism   . Sinus congestion   . Carpal tunnel syndrome   . Diabetes mellitus     NON-INSULIN   Past Surgical History  Procedure Laterality Date  . Cholecystectomy    . Appendectomy    . US echocardiography  12/06/2007    EF 55-60%  . Cardiovascular stress test  07/10/2004    EF 81%   Social History:  reports that she has never smoked. She does not have any smokeless tobacco history on file. She reports that she does not drink alcohol or use illicit drugs. Where does patient live home. Can patient participate in ADLs? Yes.  Allergies  Allergen  Reactions  . Codeine   . Hydrocodone     CAUSES  NAUSEA  . Metoprolol   . Rythmol [Propafenone Hcl]     dizzy  . Sulfa Antibiotics     Family History:  Family History  Problem Relation Age of Onset  . Stroke Mother   . Diabetes Sister   . Heart attack Neg Hx   . Hypertension    . Hypertension Mother       Prior to Admission medications   Medication Sig Start Date End Date Taking? Authorizing Provider  acetaminophen (TYLENOL) 500 MG tablet Take 1,000 mg by mouth every 6 (six) hours as needed for mild pain.    Yes Historical Provider, MD  fluticasone (FLONASE) 50 MCG/ACT nasal spray Place 2 sprays into the nose daily.    Yes Historical Provider, MD  glimepiride (AMARYL) 1 MG tablet 1.5 mg daily with breakfast.    Yes Historical Provider, MD  levothyroxine (SYNTHROID, LEVOTHROID) 88 MCG tablet Take 88 mcg by mouth daily.     Yes Historical Provider, MD  lisinopril (PRINIVIL,ZESTRIL) 10 MG tablet Take 10 mg by mouth daily.     Yes Historical Provider, MD  loratadine (CLARITIN) 10 MG tablet Take 10 mg by mouth daily as needed for allergies.    Yes Historical Provider, MD  Multiple Vitamins-Minerals (PRESERVISION AREDS 2 PO) Take 1 capsule by mouth 2 (two) times daily.    Yes Historical Provider, MD  Polyethyl Glycol-Propyl Glycol (SYSTANE OP) Apply 1 drop to eye 3 (  three) times daily as needed (dry eyes).    Yes Historical Provider, MD  Probiotic Product (PROBIOTIC DAILY PO) Take 2 tablets by mouth daily.    Yes Historical Provider, MD  sodium chloride (OCEAN) 0.65 % SOLN nasal spray Place 1 spray into both nostrils as needed for congestion (SIMPLY SALINE).    Yes Historical Provider, MD  warfarin (COUMADIN) 5 MG tablet Take as directed by Coumadin clinic Patient taking differently: Take 5 mg by mouth See admin instructions. Take as directed by Coumadin clinic. Take a whole tablet every day except on Tuesday and fridays take 7.5mg  take a whole 5mg  tablet and split it in half to make 7.5mg   04/07/14  Yes Darlin Coco, MD    Physical Exam: Filed Vitals:   05/23/14 2115 05/23/14 2130 05/23/14 2145 05/23/14 2200  BP: 156/65 130/104 154/54 135/104  Pulse: 75 83 74 74  Temp:      TempSrc:      Resp: 19 21 24 19   SpO2: 97% 97% 94% 95%     General:  Well-developed and nourished.  Eyes: Anicteric no pallor.  ENT: No discharge from the ears eyes nose and mouth. No tenderness around the ears.  Neck: No neck rigidity or mass felt.  Cardiovascular: S1 and S2 heard.  Respiratory: No rhonchi or crepitations.  Abdomen: Soft nontender bowel sounds present.  Skin: No rash.  Musculoskeletal: No edema.  Psychiatric: Appears normal.  Neurologic: Alert awake oriented to time place and person. Moves all extremities.  Labs on Admission:  Basic Metabolic Panel:  Recent Labs Lab 05/23/14 1848  NA 138  K 4.5  CL 104  CO2 28  GLUCOSE 150*  BUN 16  CREATININE 0.90  CALCIUM 9.1   Liver Function Tests:  Recent Labs Lab 05/23/14 1848  AST 17  ALT 13  ALKPHOS 109  BILITOT 0.4  PROT 7.0  ALBUMIN 3.7   No results for input(s): LIPASE, AMYLASE in the last 168 hours. No results for input(s): AMMONIA in the last 168 hours. CBC:  Recent Labs Lab 05/23/14 1848  WBC 6.5  NEUTROABS 4.2  HGB 12.4  HCT 38.6  MCV 98.0  PLT 186   Cardiac Enzymes: No results for input(s): CKTOTAL, CKMB, CKMBINDEX, TROPONINI in the last 168 hours.  BNP (last 3 results) No results for input(s): PROBNP in the last 8760 hours. CBG:  Recent Labs Lab 05/23/14 1828  GLUCAP 150*    Radiological Exams on Admission: Ct Angio Head W/cm &/or Wo Cm  05/23/2014   CLINICAL DATA:  Expressive aphasia. Last seen normal 12:30 p.m. today.  EXAM: CT ANGIOGRAPHY HEAD AND NECK  TECHNIQUE: Multidetector CT imaging of the head and neck was performed using the standard protocol during bolus administration of intravenous contrast. Multiplanar CT image reconstructions and MIPs were obtained to  evaluate the vascular anatomy. Carotid stenosis measurements (when applicable) are obtained utilizing NASCET criteria, using the distal internal carotid diameter as the denominator.  CONTRAST:  73mL OMNIPAQUE IOHEXOL 350 MG/ML SOLN  COMPARISON:  MR head 09/24/2010.  FINDINGS: CT HEAD  Calvarium and skull base: No fracture or destructive lesion. Mastoids and middle ears are grossly clear.  Paranasal sinuses: Imaged portions are clear.  Orbits: Negative.  Brain: No evidence of acute abnormality, including acute infarct, hemorrhage, hydrocephalus, or mass lesion. Normal for age cerebral volume. Mild white matter disease.  CTA NECK  Aortic arch: Standard branching. Imaged portion shows no evidence of aneurysm or dissection. No significant stenosis of the major arch  vessel origins.  Right carotid system: Minor atheromatous change. No evidence of dissection, stenosis (50% or greater) or occlusion.  Left carotid system: Minor atheromatous change. No evidence of dissection, stenosis (50% or greater) or occlusion.  Vertebral arteries: Codominant. No evidence of dissection, stenosis (50% or greater) or occlusion.  SOFT TISSUES: Spondylosis. Mild vascular congestion at the lung apices without pneumothorax or mass. No pneumothorax.  CTA HEAD  Anterior circulation: Minor atheromatous change in the carotid siphons. No significant stenosis, proximal occlusion, aneurysm, or vascular malformation.  Posterior circulation: No significant stenosis, proximal occlusion, aneurysm, or vascular malformation.  Venous sinuses: As permitted by contrast timing, patent.  Anatomic variants: None of significance.  Delayed phase:   No abnormal intracranial enhancement.  Critical Value/emergent results were called by telephone at the time of interpretation on 05/23/2014 at 7:15 pm to Dr. Alexis Goodell, who verbally acknowledged these results.  IMPRESSION: No evidence for large vessel occlusion.  Minor atheromatous change without extracranial or  intracranial flow reducing lesion.  No evidence for LEFT MCA branch occlusion, or intracranial hemorrhage.  Normal for age cerebral volume without significant changes of chronic ischemia.   Electronically Signed   By: Rolla Flatten M.D.   On: 05/23/2014 19:37   Ct Angio Neck W/cm &/or Wo/cm  05/23/2014   CLINICAL DATA:  Expressive aphasia. Last seen normal 12:30 p.m. today.  EXAM: CT ANGIOGRAPHY HEAD AND NECK  TECHNIQUE: Multidetector CT imaging of the head and neck was performed using the standard protocol during bolus administration of intravenous contrast. Multiplanar CT image reconstructions and MIPs were obtained to evaluate the vascular anatomy. Carotid stenosis measurements (when applicable) are obtained utilizing NASCET criteria, using the distal internal carotid diameter as the denominator.  CONTRAST:  22mL OMNIPAQUE IOHEXOL 350 MG/ML SOLN  COMPARISON:  MR head 09/24/2010.  FINDINGS: CT HEAD  Calvarium and skull base: No fracture or destructive lesion. Mastoids and middle ears are grossly clear.  Paranasal sinuses: Imaged portions are clear.  Orbits: Negative.  Brain: No evidence of acute abnormality, including acute infarct, hemorrhage, hydrocephalus, or mass lesion. Normal for age cerebral volume. Mild white matter disease.  CTA NECK  Aortic arch: Standard branching. Imaged portion shows no evidence of aneurysm or dissection. No significant stenosis of the major arch vessel origins.  Right carotid system: Minor atheromatous change. No evidence of dissection, stenosis (50% or greater) or occlusion.  Left carotid system: Minor atheromatous change. No evidence of dissection, stenosis (50% or greater) or occlusion.  Vertebral arteries: Codominant. No evidence of dissection, stenosis (50% or greater) or occlusion.  SOFT TISSUES: Spondylosis. Mild vascular congestion at the lung apices without pneumothorax or mass. No pneumothorax.  CTA HEAD  Anterior circulation: Minor atheromatous change in the carotid  siphons. No significant stenosis, proximal occlusion, aneurysm, or vascular malformation.  Posterior circulation: No significant stenosis, proximal occlusion, aneurysm, or vascular malformation.  Venous sinuses: As permitted by contrast timing, patent.  Anatomic variants: None of significance.  Delayed phase:   No abnormal intracranial enhancement.  Critical Value/emergent results were called by telephone at the time of interpretation on 05/23/2014 at 7:15 pm to Dr. Alexis Goodell, who verbally acknowledged these results.  IMPRESSION: No evidence for large vessel occlusion.  Minor atheromatous change without extracranial or intracranial flow reducing lesion.  No evidence for LEFT MCA branch occlusion, or intracranial hemorrhage.  Normal for age cerebral volume without significant changes of chronic ischemia.   Electronically Signed   By: Rolla Flatten M.D.   On: 05/23/2014 19:37  EKG: Independently reviewed. Atrial fibrillation rate controlled.  Assessment/Plan Principal Problem:   TIA (transient ischemic attack) Active Problems:   Hypothyroidism   Chronic atrial fibrillation   Diabetes mellitus type 2, controlled   1. TIA - patient's symptoms are concerning for TIA. Patient is already on Coumadin. At this time neurology was recommended neuro checks and get MRI brain in the morning. Get 2-D echo. Check lipid panel and hemoglobin A1c. CT angiogram of the head and neck was unremarkable. Get physical therapy consult. 2. Chronic atrial fibrillation - presently rate controlled and on Coumadin perform a CT. 3. Diabetes mellitus type 2 - continue home medications with sliding scale coverage. 4. Hypertension - initially was mildly uncontrolled but improved subsequently. Continue home medications. 5. Hypothyroidism - continue Synthroid. 6. Recently her right ear procedure done - patient is on Ciprodex drops. If patient's pain worsens may need then to the medications and at this time patient states she  feels better.   DVT Prophylaxis patient is on Coumadin.  Code Status: Full code.  Family Communication: Patient's family at the bedside.  Disposition Plan: Admit for observation.    Clayton Jarmon N. [facility] Pager [phone].  If 7PM-7AM, please contact night-coverage [email or web address] Password [email or web address] 05/23/2014, 10:12 PM

## 2014-05-24 ENCOUNTER — Observation Stay (HOSPITAL_COMMUNITY): Payer: Medicare Other

## 2014-05-24 DIAGNOSIS — I482 Chronic atrial fibrillation: Secondary | ICD-10-CM | POA: Diagnosis not present

## 2014-05-24 DIAGNOSIS — I63412 Cerebral infarction due to embolism of left middle cerebral artery: Secondary | ICD-10-CM

## 2014-05-24 DIAGNOSIS — Z885 Allergy status to narcotic agent status: Secondary | ICD-10-CM | POA: Diagnosis not present

## 2014-05-24 DIAGNOSIS — Z7901 Long term (current) use of anticoagulants: Secondary | ICD-10-CM

## 2014-05-24 DIAGNOSIS — Z8249 Family history of ischemic heart disease and other diseases of the circulatory system: Secondary | ICD-10-CM | POA: Diagnosis not present

## 2014-05-24 DIAGNOSIS — E039 Hypothyroidism, unspecified: Secondary | ICD-10-CM | POA: Diagnosis present

## 2014-05-24 DIAGNOSIS — R4789 Other speech disturbances: Secondary | ICD-10-CM | POA: Diagnosis not present

## 2014-05-24 DIAGNOSIS — R51 Headache: Secondary | ICD-10-CM | POA: Diagnosis not present

## 2014-05-24 DIAGNOSIS — Z882 Allergy status to sulfonamides status: Secondary | ICD-10-CM | POA: Diagnosis not present

## 2014-05-24 DIAGNOSIS — I6340 Cerebral infarction due to embolism of unspecified cerebral artery: Secondary | ICD-10-CM | POA: Diagnosis present

## 2014-05-24 DIAGNOSIS — E119 Type 2 diabetes mellitus without complications: Secondary | ICD-10-CM | POA: Diagnosis present

## 2014-05-24 DIAGNOSIS — R4701 Aphasia: Secondary | ICD-10-CM | POA: Diagnosis not present

## 2014-05-24 DIAGNOSIS — R410 Disorientation, unspecified: Secondary | ICD-10-CM | POA: Diagnosis not present

## 2014-05-24 DIAGNOSIS — I369 Nonrheumatic tricuspid valve disorder, unspecified: Secondary | ICD-10-CM

## 2014-05-24 DIAGNOSIS — E785 Hyperlipidemia, unspecified: Secondary | ICD-10-CM

## 2014-05-24 DIAGNOSIS — I639 Cerebral infarction, unspecified: Secondary | ICD-10-CM

## 2014-05-24 DIAGNOSIS — Z888 Allergy status to other drugs, medicaments and biological substances status: Secondary | ICD-10-CM | POA: Diagnosis not present

## 2014-05-24 DIAGNOSIS — R4781 Slurred speech: Secondary | ICD-10-CM | POA: Diagnosis not present

## 2014-05-24 DIAGNOSIS — Z79899 Other long term (current) drug therapy: Secondary | ICD-10-CM | POA: Diagnosis not present

## 2014-05-24 DIAGNOSIS — Z823 Family history of stroke: Secondary | ICD-10-CM | POA: Diagnosis not present

## 2014-05-24 DIAGNOSIS — I10 Essential (primary) hypertension: Secondary | ICD-10-CM | POA: Diagnosis present

## 2014-05-24 LAB — COMPREHENSIVE METABOLIC PANEL
ALK PHOS: 87 U/L (ref 39–117)
ALT: 11 U/L (ref 0–35)
AST: 16 U/L (ref 0–37)
Albumin: 3.1 g/dL — ABNORMAL LOW (ref 3.5–5.2)
Anion gap: 7 (ref 5–15)
BUN: 14 mg/dL (ref 6–23)
CHLORIDE: 104 meq/L (ref 96–112)
CO2: 26 mmol/L (ref 19–32)
Calcium: 8.6 mg/dL (ref 8.4–10.5)
Creatinine, Ser: 0.96 mg/dL (ref 0.50–1.10)
GFR calc non Af Amer: 49 mL/min — ABNORMAL LOW (ref >90)
GFR, EST AFRICAN AMERICAN: 57 mL/min — AB (ref >90)
Glucose, Bld: 144 mg/dL — ABNORMAL HIGH (ref 70–99)
Potassium: 3.8 mmol/L (ref 3.5–5.1)
Sodium: 137 mmol/L (ref 135–145)
Total Bilirubin: 0.5 mg/dL (ref 0.3–1.2)
Total Protein: 6 g/dL (ref 6.0–8.3)

## 2014-05-24 LAB — LIPID PANEL
CHOLESTEROL: 189 mg/dL (ref 0–200)
HDL: 47 mg/dL (ref >39)
LDL CALC: 113 mg/dL — AB (ref 0–99)
Total CHOL/HDL Ratio: 4 RATIO
Triglycerides: 147 mg/dL (ref <150)
VLDL: 29 mg/dL (ref 0–40)

## 2014-05-24 LAB — CBC WITH DIFFERENTIAL/PLATELET
BASOS ABS: 0.1 10*3/uL (ref 0.0–0.1)
Basophils Relative: 1 % (ref 0–1)
EOS ABS: 0.2 10*3/uL (ref 0.0–0.7)
Eosinophils Relative: 2 % (ref 0–5)
HEMATOCRIT: 35 % — AB (ref 36.0–46.0)
Hemoglobin: 11.2 g/dL — ABNORMAL LOW (ref 12.0–15.0)
LYMPHS ABS: 2.3 10*3/uL (ref 0.7–4.0)
Lymphocytes Relative: 30 % (ref 12–46)
MCH: 30.9 pg (ref 26.0–34.0)
MCHC: 32 g/dL (ref 30.0–36.0)
MCV: 96.7 fL (ref 78.0–100.0)
Monocytes Absolute: 1 10*3/uL (ref 0.1–1.0)
Monocytes Relative: 12 % (ref 3–12)
Neutro Abs: 4.3 10*3/uL (ref 1.7–7.7)
Neutrophils Relative %: 55 % (ref 43–77)
Platelets: 170 10*3/uL (ref 150–400)
RBC: 3.62 MIL/uL — AB (ref 3.87–5.11)
RDW: 14.2 % (ref 11.5–15.5)
WBC: 7.8 10*3/uL (ref 4.0–10.5)

## 2014-05-24 LAB — PROTIME-INR
INR: 1.84 — AB (ref 0.00–1.49)
Prothrombin Time: 21.4 seconds — ABNORMAL HIGH (ref 11.6–15.2)

## 2014-05-24 LAB — GLUCOSE, CAPILLARY
Glucose-Capillary: 113 mg/dL — ABNORMAL HIGH (ref 70–99)
Glucose-Capillary: 181 mg/dL — ABNORMAL HIGH (ref 70–99)
Glucose-Capillary: 161 mg/dL — ABNORMAL HIGH (ref 70–99)
Glucose-Capillary: 192 mg/dL — ABNORMAL HIGH (ref 70–99)

## 2014-05-24 LAB — HEMOGLOBIN A1C
Hgb A1c MFr Bld: 7 % — ABNORMAL HIGH (ref <5.7)
Mean Plasma Glucose: 154 mg/dL — ABNORMAL HIGH (ref <117)

## 2014-05-24 LAB — TSH: TSH: 1.75 u[IU]/mL (ref 0.350–4.500)

## 2014-05-24 MED ORDER — WARFARIN SODIUM 6 MG PO TABS
6.0000 mg | ORAL_TABLET | Freq: Once | ORAL | Status: DC
  Filled 2014-05-24: qty 1

## 2014-05-24 MED ORDER — ONDANSETRON HCL 4 MG/2ML IJ SOLN: 4.0000 mg | Freq: Four times a day (QID) | INTRAMUSCULAR | Status: DC | PRN

## 2014-05-24 MED ORDER — MORPHINE SULFATE 2 MG/ML IJ SOLN
1.0000 mg | Freq: Once | INTRAMUSCULAR | Status: AC
  Administered 2014-05-24: 1 mg via INTRAVENOUS
  Filled 2014-05-24: qty 1

## 2014-05-24 MED ORDER — PRAVASTATIN SODIUM 20 MG PO TABS
20.0000 mg | ORAL_TABLET | Freq: Every day | ORAL | Status: DC
Start: 2014-05-24 — End: 2014-05-25
  Administered 2014-05-24: 20 mg via ORAL
  Filled 2014-05-24 (×2): qty 1

## 2014-05-24 MED ORDER — WARFARIN SODIUM 6 MG PO TABS
6.0000 mg | ORAL_TABLET | Freq: Once | ORAL | Status: AC
Start: 2014-05-24 — End: 2014-05-24
  Administered 2014-05-24: 6 mg via ORAL
  Filled 2014-05-24: qty 1

## 2014-05-24 MED ORDER — WARFARIN - PHARMACIST DOSING INPATIENT: Freq: Every day | Status: DC

## 2014-05-24 NOTE — Progress Notes (Addendum)
Pt Grandson calls nursing station saying that grandma is having slurred speech. RN went to pt room and assessed pt. Neurology and Schorr, NP paged. Will continue to monitor pt. Pt taken down to CT. MD at bedside.

## 2014-05-24 NOTE — Progress Notes (Addendum)
STROKE TEAM PROGRESS NOTE   HISTORY OF PRESENT ILLNESS Amanda [name] is an 81 y.o. female who saw a physician this morning due to a painful ear. Patient was given ear drops and returned home to take a nap. She was at baseline at that time. When she awakened at 13 she had trouble speaking and understanding speech. Patient was brought to the ED at that time where a right leg drift was noted as well. Code stroke was called at that time. Initial NIHSS of 3.   Date last known well: Date: 05/23/2014 Time last known well: Time: 12:30 tPA Given: No: Resolution of symptoms, patient on Coumadin  SUBJECTIVE (INTERVAL HISTORY) Her daughter and daughter in law are at the bedside.  Overall she feels her condition is completely resolved. She had two episodes of expressive aphasia yesterday afternoon and this morning. She takes coumadin at home and INR was 1.9 yesterday pm and 1.8 this am. MRI showed left frontal punctate infarct at motor strip. Discussed with pt and family about anticoagulation choices.   OBJECTIVE Temp:  [97.8 F (36.6 C)-99.2 F (37.3 C)] 99.2 F (37.3 C) (01/06 1133) Pulse Rate:  [52-103] 59 (01/06 0550) Cardiac Rhythm:  [-] Atrial fibrillation (01/06 0811) Resp:  [16-24] 20 (01/06 0550) BP: (107-213)/(51-104) 181/76 mmHg (01/06 1133) SpO2:  [94 %-100 %] 97 % (01/06 1133) Weight:  [171 lb 9.6 oz (77.837 kg)] 171 lb 9.6 oz (77.837 kg) (01/05 2226)   Recent Labs Lab 05/23/14 1828 05/23/14 2323 05/24/14 0731 05/24/14 1147  GLUCAP 150* 127* 113* 181*    Recent Labs Lab 05/23/14 1848 05/24/14 0534  NA 138 137  K 4.5 3.8  CL 104 104  CO2 28 26  GLUCOSE 150* 144*  BUN 16 14  CREATININE 0.90 0.96  CALCIUM 9.1 8.6    Recent Labs Lab 05/23/14 1848 05/24/14 0534  AST 17 16  ALT 13 11  ALKPHOS 109 87  BILITOT 0.4 0.5  PROT 7.0 6.0  ALBUMIN 3.7 3.1*    Recent Labs Lab 05/23/14 1848 05/24/14 0534  WBC 6.5 7.8  NEUTROABS 4.2 4.3  HGB 12.4 11.2*   HCT 38.6 35.0*  MCV 98.0 96.7  PLT 186 170   No results for input(s): CKTOTAL, CKMB, CKMBINDEX, TROPONINI in the last 168 hours.  Recent Labs  05/23/14 1848 05/24/14 0534  LABPROT 22.1* 21.4*  INR 1.91* 1.84*    Recent Labs  05/23/14 2023  COLORURINE YELLOW  LABSPEC 1.040*  PHURINE 7.5  GLUCOSEU NEGATIVE  HGBUR NEGATIVE  BILIRUBINUR NEGATIVE  KETONESUR NEGATIVE  PROTEINUR NEGATIVE  UROBILINOGEN 0.2  NITRITE NEGATIVE  LEUKOCYTESUR NEGATIVE       Component Value Date/Time   CHOL 189 05/24/2014 0534   TRIG 147 05/24/2014 0534   HDL 47 05/24/2014 0534   CHOLHDL 4.0 05/24/2014 0534   VLDL 29 05/24/2014 0534   LDLCALC 113* 05/24/2014 0534   Lab Results  Component Value Date   HGBA1C 7.0* 05/24/2014   No results found for: LABOPIA, COCAINSCRNUR, LABBENZ, AMPHETMU, THCU, LABBARB  No results for input(s): ETH in the last 168 hours.  I have personally reviewed the radiological images below and agree with the radiology interpretations.  Ct Angio Head and neck W/cm &/or Wo Cm 05/23/2014  IMPRESSION: No evidence for large vessel occlusion.  Minor atheromatous change without extracranial or intracranial flow reducing lesion.  No evidence for LEFT MCA branch occlusion, or intracranial hemorrhage.  Normal for age cerebral volume without significant changes of chronic ischemia.  Electronically Signed   By: Rolla Flatten M.D.   On: 05/23/2014 19:37   Ct Head Wo Contrast 05/24/2014   IMPRESSION: No acute abnormalities.       Mri Brain Without Contrast 05/24/2014    IMPRESSION: Punctate acute infarction affecting the left postcentral gyrus. No swelling or hemorrhage.  Atrophy and mild chronic small-vessel disease elsewhere, less than often seen in healthy individuals of this age.     Dg Chest Port 1 View  05/24/2014    IMPRESSION: There is no CHF nor pneumonia. Mild hyperinflation may reflect COPD or reactive airway disease.      2D Echocardiogram  pending  PHYSICAL EXAM  Temp:   [97.8 F (36.6 C)-99.2 F (37.3 C)] 99.2 F (37.3 C) (01/06 1133) Pulse Rate:  [52-103] 59 (01/06 0550) Resp:  [16-24] 20 (01/06 0550) BP: (107-213)/(51-104) 181/76 mmHg (01/06 1133) SpO2:  [94 %-100 %] 97 % (01/06 1133) Weight:  [171 lb 9.6 oz (77.837 kg)] 171 lb 9.6 oz (77.837 kg) (01/05 2226)  General - Well nourished, well developed, in no apparent distress.  Ophthalmologic - Sharp disc margins OU.  Cardiovascular - irregularly irregular heart rate and rhythm  Neck - supple, no carotid bruits  Mental Status -  Level of arousal and orientation to time, place, and person were intact. Language including expression, naming, repetition, comprehension was assessed and found intact.  Cranial Nerves II - XII - II - Visual field intact OU. III, IV, VI - Extraocular movements intact. V - Facial sensation intact bilaterally. VII - Facial movement intact bilaterally. VIII - Hearing & vestibular intact bilaterally. X - Palate elevates symmetrically. XI - Chin turning & shoulder shrug intact bilaterally. XII - Tongue protrusion intact.  Motor Strength - The patient's strength was 4/5 in all extremities and pronator drift was absent.  Bulk was normal and fasciculations were absent.   Motor Tone - Muscle tone was assessed at the neck and appendages and was normal.  Reflexes - The patient's reflexes were symmetrical in all extremities and she had no pathological reflexes.  Sensory - Light touch, temperature/pinprick were assessed and were symmetrical.    Coordination - The patient had normal movements in the hands and feet with no ataxia or dysmetria.  Tremor was absent.  Gait and Station - not tested due to fatigue.   ASSESSMENT/PLAN [name] is a 81 y.o. female with history of afib on coumadin and DM admitted for episodic expressive aphasia. Symptoms resolved now. She had two episodes of expressive aphasia yesterday afternoon and this morning. She takes coumadin at  home and INR was 1.9 yesterday pm and 1.8 this am.  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 Due to fluctuation of INR, we recommend to consider NOAC such as eliquis 5mg  bid. If pt still wants to keep coumadin, would recommend lovenox bridge and INR goal 2.5-3.0.   Patient counseled to be compliant with her antithrombotic medications  Ongoing aggressive stroke risk factor management  Afib on coumadin  subtherapeutic INR  INR goal 2.5-3.0 for stroke prevention  we recommend to consider NOAC such as eliquis 5mg  bid. If pt still wants to keep coumadin, would recommend lovenox bridge  Diabetes  HgbA1c 7.0 goal <  7.0  Controlled  Currently on amaryl  CBG monitoring  SSI  DM education  Hypertension  Home meds:   lisinopril Permissive hypertension (OK if <220/120) for 24-48 hours post stroke and then gradually normalized within 5-7 days. Currently on lisinopril  Stable  Patient counseled to be compliant with her blood pressure medications  Hyperlipidemia  Home meds:  none   Add pravastatin 20mg  daily  LDL 113, goal < 70  Continue statin at discharge  Other Stroke Risk Factors  Advanced age  Hospital day # 1  I have personally obtained the history, examined the patient, evaluated laboratory data, individually viewed imaging studies and agree with radiology interpretations. I also obtained additional history from pt's daughter at bedside. I also discussed with Dr. [place] Pink regarding his care plan.   Neurology will sign off. Please call with questions. Pt will follow up with Dr. Erlinda Hong at [facility] in about 2 months. Thanks for the consult.  Rosalin Hawking, MD PhD Stroke Neurology 05/24/2014 1:49 PM    To contact Stroke  Continuity provider, please refer to [email or web address] After hours, contact General Neurology

## 2014-05-24 NOTE — Discharge Instructions (Addendum)
Rivaroxaban oral tablets °What is this medicine? °RIVAROXABAN (ri va ROX a ban) is an anticoagulant (blood thinner). It is used to treat blood clots in the lungs or in the veins. It is also used after knee or hip surgeries to prevent blood clots. It is also used to lower the chance of stroke in people with a medical condition called atrial fibrillation. °This medicine may be used for other purposes; ask your health care provider or pharmacist if you have questions. °COMMON BRAND NAME(S): Xarelto, Xarelto Starter Pack °What should I tell my health care provider before I take this medicine? °They need to know if you have any of these conditions: °-bleeding disorders °-bleeding in the brain °-blood in your stools (black or tarry stools) or if you have blood in your vomit °-history of stomach bleeding °-kidney disease °-liver disease °-low blood counts, like low white cell, platelet, or red cell counts °-recent or planned spinal or epidural procedure °-take medicines that treat or prevent blood clots °-an unusual or allergic reaction to rivaroxaban, other medicines, foods, dyes, or preservatives °-pregnant or trying to get pregnant °-breast-feeding °How should I use this medicine? °Take this medicine by mouth with a glass of water. Follow the directions on the prescription label. Take your medicine at regular intervals. Do not take it more often than directed. Do not stop taking except on your doctor's advice. Stopping this medicine may increase your risk of a blot clot. Be sure to refill your prescription before you run out of medicine. °If you are taking this medicine after hip or knee replacement surgery, take it with or without food. If you are taking this medicine for atrial fibrillation, take it with your evening meal. If you are taking this medicine to treat blood clots, take it with food at the same time each day. If you are unable to swallow your tablet, you may crush the tablet and mix it in applesauce. Then,  immediately eat the applesauce. You should eat more food right after you eat the applesauce containing the crushed tablet. °Talk to your pediatrician regarding the use of this medicine in children. Special care may be needed. °Overdosage: If you think you have taken too much of this medicine contact a poison control center or emergency room at once. °NOTE: This medicine is only for you. Do not share this medicine with others. °What if I miss a dose? °If you take your medicine once a day and miss a dose, take the missed dose as soon as you remember. If you take your medicine twice a day and miss a dose, take the missed dose immediately. In this instance, 2 tablets may be taken at the same time. The next day you should take 1 tablet twice a day as directed. °What may interact with this medicine? °-aspirin and aspirin-like medicines °-certain antibiotics like erythromycin, azithromycin, and clarithromycin °-certain medicines for fungal infections like ketoconazole and itraconazole °-certain medicines for irregular heart beat like amiodarone, quinidine, dronedarone °-certain medicines for seizures like carbamazepine, phenytoin °-certain medicines that treat or prevent blood clots like warfarin, enoxaparin, and dalteparin °-conivaptan °-diltiazem °-felodipine °-indinavir °-lopinavir; ritonavir °-NSAIDS, medicines for pain and inflammation, like ibuprofen or naproxen °-ranolazine °-rifampin °-ritonavir °-St. John's wort °-verapamil °This list may not describe all possible interactions. Give your health care provider a list of all the medicines, herbs, non-prescription drugs, or dietary supplements you use. Also tell them if you smoke, drink alcohol, or use illegal drugs. Some items may interact with your medicine. °What   should I watch for while using this medicine? °Visit your doctor or health care professional for regular checks on your progress. Your condition will be monitored carefully while you are receiving this  medicine. °Notify your doctor or health care professional and seek emergency treatment if you develop breathing problems; changes in vision; chest pain; severe, sudden headache; pain, swelling, warmth in the leg; trouble speaking; sudden numbness or weakness of the face, arm, or leg. These can be signs that your condition has gotten worse. °If you are going to have surgery, tell your doctor or health care professional that you are taking this medicine. °Tell your health care professional that you use this medicine before you have a spinal or epidural procedure. Sometimes people who take this medicine have bleeding problems around the spine when they have a spinal or epidural procedure. This bleeding is very rare. If you have a spinal or epidural procedure while on this medicine, call your health care professional immediately if you have back pain, numbness or tingling (especially in your legs and feet), muscle weakness, paralysis, or loss of bladder or bowel control. °Avoid sports and activities that might cause injury while you are using this medicine. Severe falls or injuries can cause unseen bleeding. Be careful when using sharp tools or knives. Consider using an electric razor. Take special care brushing or flossing your teeth. Report any injuries, bruising, or red spots on the skin to your doctor or health care professional. °What side effects may I notice from receiving this medicine? °Side effects that you should report to your doctor or health care professional as soon as possible: °-allergic reactions like skin rash, itching or hives, swelling of the face, lips, or tongue °-back pain °-redness, blistering, peeling or loosening of the skin, including inside the mouth °-signs and symptoms of bleeding such as bloody or black, tarry stools; red or dark-brown urine; spitting up blood or brown material that looks like coffee grounds; red spots on the skin; unusual bruising or bleeding from the eye, gums, or  nose °Side effects that usually do not require medical attention (Report these to your doctor or health care professional if they continue or are bothersome.): °-dizziness °-muscle pain °This list may not describe all possible side effects. Call your doctor for medical advice about side effects. You may report side effects to FDA at 1-800-FDA-1088. °Where should I keep my medicine? °Keep out of the reach of children. °Store at room temperature between 15 and 30 degrees C (59 and 86 degrees F). Throw away any unused medicine after the expiration date. °NOTE: This sheet is a summary. It may not cover all possible information. If you have questions about this medicine, talk to your doctor, pharmacist, or health care provider. °© 2015, [employer]. (2013-08-25 18:47:48) ° °

## 2014-05-24 NOTE — Progress Notes (Addendum)
Progress Note: Notified by nursing that family noted patient with recurrence of expressive aphasia.  Nursing called a code stroke.  LKW at [place] Patient evaluated in transport.  NIHSS at that time of 0.  Repeat head CT personally reviewed and shows no acute changes.  Recommendations: 1.  Continue neuro checks 2.  Continue current plan  Family updated  Alexis Goodell, MD [facility] [phone] 05/24/2014  7:30 AM

## 2014-05-24 NOTE — Progress Notes (Signed)
UR completed 

## 2014-05-24 NOTE — Progress Notes (Signed)
PROGRESS NOTE  Amanda [name] KYH:[id number] DOB: 10/13/1918 DOA: 05/23/2014 PCP: Mayra Neer, MD  HPI/Recap of past 80 hours: 81 year old female past mental history atrial fibrillation on chronic Coumadin who was brought in to the emergency room on 1/5 night for having sudden onset trouble speaking and comprehension. CT scan of the head was negative, patient was not given TPA because her symptoms resolved. INR found to be slightly subtherapeutic at 1.9. Patient was brought into the hospital.  There was a question as to whether patient had taken Her dose of Coumadin prior to coming in so she did not receive Coumadin on 1/5 night. That evening, patient had another episode while in the hospital of expressive aphasia. Code stroke was called. Repeat head CT negative with no acute changes. Her stroke scale at that time was 0 and TPA was not given. Symptoms then resolved again. INR was checked this morning and found to be subtherapeutic at 1.8. MRI done this morning confirmed evidence of acute infarct in the left motor strip.  Assessment/Plan: Principal Problem:   Acute CVA (cerebrovascular accident): Felt to be secondary to subtherapeutic INR while on Coumadin. We had extensive talk with the whole family about the benefits of the new or anticoagulants and should be better served with this. Patient and family plan to think about this tonight. Active Problems:   Hypothyroidism: Stable continue Synthroid   Chronic atrial fibrillation: Rate controlled, decision to me tomorrow whether to continue on with Coumadin or switch over to new anticoagulation agents   Diabetes mellitus type 2, controlled: Blood sugar stable, A1c notes good control    Chronic anticoagulation: As above  Hyperlipidemia: LDL above 100. Started on statin.   Code Status: Full code  Family Communication: Son and daughters present  Disposition Plan: Home tomorrow   Consultants:  Stroke  service  Procedures:  Echocardiogram, preserved ejection fraction   Antibiotics:  None   Objective: BP 119/45 mmHg  Pulse 59  Temp(Src) 98.2 F (36.8 C) (Oral)  Resp 20  Ht 5\' 4"  (1.626 m)  Wt 77.837 kg (171 lb 9.6 oz)  BMI 29.44 kg/m2  SpO2 93%  Intake/Output Summary (Last 24 hours) at 05/24/14 1832 Last data filed at 05/24/14 1758  Gross per 24 hour  Intake 190.17 ml  Output   1101 ml  Net -910.83 ml   Filed Weights   05/23/14 2226  Weight: 77.837 kg (171 lb 9.6 oz)    Exam:   General:  Alert and oriented 3, no acute distress  Cardiovascular: Irregular rhythm, rate controlled  Respiratory: Clear to auscultation bilaterally  Abdomen: Soft, nontender, nondistended, positive bowel sounds  Musculoskeletal: No clubbing or cyanosis, trace edema   Data Reviewed: Basic Metabolic Panel:  Recent Labs Lab 05/23/14 1848 05/24/14 0534  NA 138 137  K 4.5 3.8  CL 104 104  CO2 28 26  GLUCOSE 150* 144*  BUN 16 14  CREATININE 0.90 0.96  CALCIUM 9.1 8.6   Liver Function Tests:  Recent Labs Lab 05/23/14 1848 05/24/14 0534  AST 17 16  ALT 13 11  ALKPHOS 109 87  BILITOT 0.4 0.5  PROT 7.0 6.0  ALBUMIN 3.7 3.1*   No results for input(s): LIPASE, AMYLASE in the last 168 hours. No results for input(s): AMMONIA in the last 168 hours. CBC:  Recent Labs Lab 05/23/14 1848 05/24/14 0534  WBC 6.5 7.8  NEUTROABS 4.2 4.3  HGB 12.4 11.2*  HCT 38.6 35.0*  MCV 98.0 96.7  PLT 186 170  Cardiac Enzymes:   No results for input(s): CKTOTAL, CKMB, CKMBINDEX, TROPONINI in the last 168 hours. BNP (last 3 results) No results for input(s): PROBNP in the last 8760 hours. CBG:  Recent Labs Lab 05/23/14 1828 05/23/14 2323 05/24/14 0731 05/24/14 1147 05/24/14 1647  GLUCAP 150* 127* 113* 181* 161*    No results found for this or any previous visit (from the past 240 hour(s)).   Studies: Ct Angio Head W/cm &/or Wo Cm  05/23/2014   CLINICAL DATA:   Expressive aphasia. Last seen normal 12:30 p.m. today.  EXAM: CT ANGIOGRAPHY HEAD AND NECK  TECHNIQUE: Multidetector CT imaging of the head and neck was performed using the standard protocol during bolus administration of intravenous contrast. Multiplanar CT image reconstructions and MIPs were obtained to evaluate the vascular anatomy. Carotid stenosis measurements (when applicable) are obtained utilizing NASCET criteria, using the distal internal carotid diameter as the denominator.  CONTRAST:  24mL OMNIPAQUE IOHEXOL 350 MG/ML SOLN  COMPARISON:  MR head 09/24/2010.  FINDINGS: CT HEAD  Calvarium and skull base: No fracture or destructive lesion. Mastoids and middle ears are grossly clear.  Paranasal sinuses: Imaged portions are clear.  Orbits: Negative.  Brain: No evidence of acute abnormality, including acute infarct, hemorrhage, hydrocephalus, or mass lesion. Normal for age cerebral volume. Mild white matter disease.  CTA NECK  Aortic arch: Standard branching. Imaged portion shows no evidence of aneurysm or dissection. No significant stenosis of the major arch vessel origins.  Right carotid system: Minor atheromatous change. No evidence of dissection, stenosis (50% or greater) or occlusion.  Left carotid system: Minor atheromatous change. No evidence of dissection, stenosis (50% or greater) or occlusion.  Vertebral arteries: Codominant. No evidence of dissection, stenosis (50% or greater) or occlusion.  SOFT TISSUES: Spondylosis. Mild vascular congestion at the lung apices without pneumothorax or mass. No pneumothorax.  CTA HEAD  Anterior circulation: Minor atheromatous change in the carotid siphons. No significant stenosis, proximal occlusion, aneurysm, or vascular malformation.  Posterior circulation: No significant stenosis, proximal occlusion, aneurysm, or vascular malformation.  Venous sinuses: As permitted by contrast timing, patent.  Anatomic variants: None of significance.  Delayed phase:   No abnormal  intracranial enhancement.  Critical Value/emergent results were called by telephone at the time of interpretation on 05/23/2014 at 7:15 pm to Dr. Alexis Goodell, who verbally acknowledged these results.  IMPRESSION: No evidence for large vessel occlusion.  Minor atheromatous change without extracranial or intracranial flow reducing lesion.  No evidence for LEFT MCA branch occlusion, or intracranial hemorrhage.  Normal for age cerebral volume without significant changes of chronic ischemia.   Electronically Signed   By: Rolla Flatten M.D.   On: 05/23/2014 19:37   Ct Angio Neck W/cm &/or Wo/cm  05/23/2014   CLINICAL DATA:  Expressive aphasia. Last seen normal 12:30 p.m. today.  EXAM: CT ANGIOGRAPHY HEAD AND NECK  TECHNIQUE: Multidetector CT imaging of the head and neck was performed using the standard protocol during bolus administration of intravenous contrast. Multiplanar CT image reconstructions and MIPs were obtained to evaluate the vascular anatomy. Carotid stenosis measurements (when applicable) are obtained utilizing NASCET criteria, using the distal internal carotid diameter as the denominator.  CONTRAST:  25mL OMNIPAQUE IOHEXOL 350 MG/ML SOLN  COMPARISON:  MR head 09/24/2010.  FINDINGS: CT HEAD  Calvarium and skull base: No fracture or destructive lesion. Mastoids and middle ears are grossly clear.  Paranasal sinuses: Imaged portions are clear.  Orbits: Negative.  Brain: No evidence of acute abnormality, including  acute infarct, hemorrhage, hydrocephalus, or mass lesion. Normal for age cerebral volume. Mild white matter disease.  CTA NECK  Aortic arch: Standard branching. Imaged portion shows no evidence of aneurysm or dissection. No significant stenosis of the major arch vessel origins.  Right carotid system: Minor atheromatous change. No evidence of dissection, stenosis (50% or greater) or occlusion.  Left carotid system: Minor atheromatous change. No evidence of dissection, stenosis (50% or greater) or  occlusion.  Vertebral arteries: Codominant. No evidence of dissection, stenosis (50% or greater) or occlusion.  SOFT TISSUES: Spondylosis. Mild vascular congestion at the lung apices without pneumothorax or mass. No pneumothorax.  CTA HEAD  Anterior circulation: Minor atheromatous change in the carotid siphons. No significant stenosis, proximal occlusion, aneurysm, or vascular malformation.  Posterior circulation: No significant stenosis, proximal occlusion, aneurysm, or vascular malformation.  Venous sinuses: As permitted by contrast timing, patent.  Anatomic variants: None of significance.  Delayed phase:   No abnormal intracranial enhancement.  Critical Value/emergent results were called by telephone at the time of interpretation on 05/23/2014 at 7:15 pm to Dr. Alexis Goodell, who verbally acknowledged these results.  IMPRESSION: No evidence for large vessel occlusion.  Minor atheromatous change without extracranial or intracranial flow reducing lesion.  No evidence for LEFT MCA branch occlusion, or intracranial hemorrhage.  Normal for age cerebral volume without significant changes of chronic ischemia.   Electronically Signed   By: Rolla Flatten M.D.   On: 05/23/2014 19:37    Scheduled Meds: . ciprofloxacin-dexamethasone  4 drop Right Ear BID  . fluticasone  2 spray Each Nare Daily  . glimepiride  1.5 mg Oral Q breakfast  . insulin aspart  0-9 Units Subcutaneous TID WC  . levothyroxine  88 mcg Oral QAC breakfast  . lisinopril  10 mg Oral Daily  . pravastatin  20 mg Oral q1800  . Warfarin - Pharmacist Dosing Inpatient   Does not apply q1800    Continuous Infusions: . sodium chloride 10 mL/hr at 05/24/14 1604     Time spent: 25 minutes  [facility] Pager [phone]. If 7PM-7AM, please contact night-coverage at [email or web address], password [facility] 05/24/2014, 6:32 PM  LOS: 1 day

## 2014-05-24 NOTE — Progress Notes (Signed)
ANTICOAGULATION CONSULT NOTE - Follow Up Consult  Pharmacy Consult for Coumadin Indication: atrial fibrillation  Allergies  Allergen Reactions  . Codeine   . Hydrocodone     CAUSES  NAUSEA  . Metoprolol   . Rythmol [Propafenone Hcl]     dizzy  . Sulfa Antibiotics     Patient Measurements: Height: 5\' 4"  (162.6 cm) Weight: 171 lb 9.6 oz (77.837 kg) IBW/kg (Calculated) : 54.7 Heparin Dosing Weight:   Vital Signs: Temp: 98.5 F (36.9 C) (01/06 0550) Temp Source: Oral (01/06 0550) BP: 137/51 mmHg (01/06 0550) Pulse Rate: 59 (01/06 0550)  Labs:  Recent Labs  05/23/14 1848 05/24/14 0534  HGB 12.4 11.2*  HCT 38.6 35.0*  PLT 186 170  APTT 40*  --   LABPROT 22.1* 21.4*  INR 1.91* 1.84*  CREATININE 0.90 0.96    Estimated Creatinine Clearance: 36.9 mL/min (by C-G formula based on Cr of 0.96).   Medications:  Scheduled:  . ciprofloxacin-dexamethasone  4 drop Right Ear BID  . fluticasone  2 spray Each Nare Daily  . glimepiride  1.5 mg Oral Q breakfast  . insulin aspart  0-9 Units Subcutaneous TID WC  . levothyroxine  88 mcg Oral QAC breakfast  . lisinopril  10 mg Oral Daily    Assessment: 81yo female with AFib and Code Stroke- cancelled d/t symptom resolution & Coumadin pta.  She had a sub-therapeutic INR in ED which is down again this AM, but had already taken her dose at home on 1/5, so did not receive a dose here.  She did have a repeat episode of expressive aphasia and had a repeat head CT, reviewed by Neuro with no acute changes.  MRI today(-) bleed as well. No bleeding noted.  Hg and pltc are stable.   Goal of Therapy:  INR 2-3 Monitor platelets by anticoagulation protocol: Yes   Plan:  Coumadin 6mg  Continue daily INR  Gracy Bruins, PharmD Clinical Pharmacist [facility]

## 2014-05-25 LAB — URINE CULTURE

## 2014-05-25 LAB — GLUCOSE, CAPILLARY
GLUCOSE-CAPILLARY: 197 mg/dL — AB (ref 70–99)
Glucose-Capillary: 119 mg/dL — ABNORMAL HIGH (ref 70–99)

## 2014-05-25 LAB — PROTIME-INR
INR: 1.5 — ABNORMAL HIGH (ref 0.00–1.49)
Prothrombin Time: 18.2 seconds — ABNORMAL HIGH (ref 11.6–15.2)

## 2014-05-25 MED ORDER — PRAVASTATIN SODIUM 20 MG PO TABS: 20.0000 mg | ORAL_TABLET | Freq: Every day | ORAL | Status: DC

## 2014-05-25 MED ORDER — RIVAROXABAN 15 MG PO TABS
15.0000 mg | ORAL_TABLET | Freq: Every day | ORAL | Status: DC
  Filled 2014-05-25: qty 1

## 2014-05-25 MED ORDER — RIVAROXABAN 20 MG PO TABS: ORAL_TABLET | ORAL | Status: DC

## 2014-05-25 MED ORDER — CIPROFLOXACIN-DEXAMETHASONE 0.3-0.1 % OT SUSP
4.0000 [drp] | Freq: Two times a day (BID) | OTIC | Status: AC
Start: 2014-05-25 — End: 2014-05-30

## 2014-05-25 MED ORDER — RIVAROXABAN (XARELTO) VTE STARTER PACK (15 & 20 MG): ORAL_TABLET | ORAL | Status: DC

## 2014-05-25 MED ORDER — RIVAROXABAN (XARELTO) EDUCATION KIT FOR DVT/PE PATIENTS
PACK | Freq: Once | Status: DC
  Filled 2014-05-25 (×2): qty 1

## 2014-05-25 NOTE — Care Management Note (Signed)
    Page 1 of 1   05/25/2014     5:12:56 PM CARE MANAGEMENT NOTE 05/25/2014  Patient:  Amanda [name], Amanda [name]   Account Number:  [id number]  Date Initiated:  05/25/2014  Documentation initiated by:  Tomi Bamberger  Subjective/Objective Assessment:   dx cva  admit- lives alone, but son comes by. pta indep     Action/Plan:   Anticipated DC Date:  05/25/2014   Anticipated DC Plan:  [place]  CM consult      Choice offered to / List presented to:             Status of service:  Completed, signed off Medicare Important Message given?  YES (If response is "NO", the following Medicare IM given date fields will be blank) Date Medicare IM given:  05/25/2014 Medicare IM given by:  Tomi Bamberger Date Additional Medicare IM given:   Additional Medicare IM given by:    Discharge Disposition:  HOME/SELF CARE  Per UR Regulation:  Reviewed for med. necessity/level of care/duration of stay  If discussed at [place] of Stay Meetings, dates discussed:    Comments:  05/25/14 [place], BSN [phone]  patient for dc today, NCM gave patient 30 day free trial savings card for xarelto.  MD did prior approval for this medication.

## 2014-05-25 NOTE — Progress Notes (Addendum)
Patient discharge teaching given, including activity, diet, follow-up appoints, and medications. Patient verbalized understanding of all discharge instructions. Spoke with pharmacist regarding Xarelto teaching and she stated patient did not need teaching from the pharmacist since she was on a blood thinner previously. Gave her xarelto discharge kit and answered all questions. Went over stroke education and answered all questions. Multiple handouts given. IV access was d/c'd. Vitals are stable. Skin is intact except as charted in most recent assessments. Pt to be escorted out by NT, to be driven home by family.

## 2014-05-25 NOTE — Progress Notes (Signed)
NCM gave patient 30 day free trial savings card for xarelto.

## 2014-05-25 NOTE — Discharge Summary (Signed)
Discharge Summary  Amanda [name] PRF:[id number] DOB: 06-26-1918  PCP: Mayra Neer, MD  Admit date: 05/23/2014 Discharge date: 05/25/2014  Time spent: 35 minutes  Recommendations for Outpatient Follow-up:  1. New medication: Pravachol 20 mg by mouth daily at bedtime 2. New medication: Xarelto 15 mg by mouth twice a day 21 days, then increase to 29 g by mouth daily 3. Medication change: Coumadin being discontinued  Discharge Diagnoses:  Active Hospital Problems   Diagnosis Date Noted  . Acute CVA (cerebrovascular accident) 05/23/2014  . Chronic anticoagulation 05/24/2014  . Chronic atrial fibrillation 05/23/2014  . Diabetes mellitus type 2, controlled 05/23/2014  . Hypothyroidism 10/09/2010    Resolved Hospital Problems   Diagnosis Date Noted Date Resolved  No resolved problems to display.    Discharge Condition: Improved, being discharged home  Diet recommendation: Carb modified heart healthy  Filed Weights   05/23/14 2226  Weight: 77.837 kg (171 lb 9.6 oz)    History of present illness:  Patient is a 81 year old female past mental history of atrial fibrillation, hypothyroidism diabetes mellitus to experience sudden onset aphasia and facial droop and was brought into the emergency room on 1/5. Symptoms had resolved by time of arrival to ER. Patient brought in for further stroke workup. At that time, INR was noted to be subtherapeutic at 1.9.  Patient not given Coumadin that night as there was some communication that she had taken a higher dose of 7.5 at some point that day which the family stated was not accurate.  Overnight, patient had another onset of sudden onset A. fib. Again this was self-limiting and resolved. MRI done confirmed acute infarct on the left cingulate gyrus. INR on 1/6 was noted to be 1.6.   Hospital Course:  Principal Problem:   Acute CVA (cerebrovascular accident): Complete stroke workup noted normal echo and Dopplers. Patient's LDL found to be  greater than 100. Started on Pravachol. Cause of stroke felt to be secondary to subtherapeutic INR. See below. Patient will follow-up with neurology in the next 2 months  Active Problems:   Hypothyroidism: Stable, TSH normal, continue on Synthroid    Chronic atrial fibrillation on chronic anticoagulation: Neurology recommendation was for patient to switch over to do her anticoagulants. Patient initially quite resistant and she had concerns based on TV commercials. Advised the patient to consider her options. In the interim, we're happy to continue her on Coumadin plus Lovenox bridge. By 1/7 morning after extensive discussion and also checking with her cardiologist, Dr. Mare Ferrari who agreed, patient was then started on Xarelto. Family given education and information and patient will follow-up with cardiology in the next few weeks.    Diabetes mellitus type 2, controlled: A1c noted to be 7.0. Patient will continue on all medications    Procedures:  Echocardiogram done 1/6: Preserved ejection fraction  Carotid Dopplers: No evidence of stenoses bilaterally  Consultations:  Neurology  Discharge Exam: BP 163/86 mmHg  Pulse 72  Temp(Src) 97.8 F (36.6 C) (Oral)  Resp 18  Ht 5\' 4"  (1.626 m)  Wt 77.837 kg (171 lb 9.6 oz)  BMI 29.44 kg/m2  SpO2 98%  General: Alert and oriented 3, no acute distress Cardiovascular: Irregular rhythm, rate controlled Respiratory: Clear to auscultation bilaterally  Discharge Instructions You were cared for by a hospitalist during your hospital stay. If you have any questions about your discharge medications or the care you received while you were in the hospital after you are discharged, you can call the unit and asked to  speak with the hospitalist on call if the hospitalist that took care of you is not available. Once you are discharged, your primary care physician will handle any further medical issues. Please note that NO REFILLS for any discharge  medications will be authorized once you are discharged, as it is imperative that you return to your primary care physician (or establish a relationship with a primary care physician if you do not have one) for your aftercare needs so that they can reassess your need for medications and monitor your lab values.  Discharge Instructions    Ambulatory referral to Neurology    Complete by:  As directed   Pt will follow up with Dr. Erlinda Hong at [facility] in about 2 months. Thanks.     Diet - low sodium heart healthy    Complete by:  As directed      Increase activity slowly    Complete by:  As directed             Medication List    STOP taking these medications        warfarin 5 MG tablet  Commonly known as:  COUMADIN      TAKE these medications        acetaminophen 500 MG tablet  Commonly known as:  TYLENOL  Take 1,000 mg by mouth every 6 (six) hours as needed for mild pain.     ciprofloxacin-dexamethasone otic suspension  Commonly known as:  CIPRODEX  Place 4 drops into the right ear 2 (two) times daily.     fluticasone 50 MCG/ACT nasal spray  Commonly known as:  FLONASE  Place 2 sprays into the nose daily.     glimepiride 1 MG tablet  Commonly known as:  AMARYL  1.5 mg daily with breakfast.     levothyroxine 88 MCG tablet  Commonly known as:  SYNTHROID, LEVOTHROID  Take 88 mcg by mouth daily.     lisinopril 10 MG tablet  Commonly known as:  PRINIVIL,ZESTRIL  Take 10 mg by mouth daily.     loratadine 10 MG tablet  Commonly known as:  CLARITIN  Take 10 mg by mouth daily as needed for allergies.     pravastatin 20 MG tablet  Commonly known as:  PRAVACHOL  Take 1 tablet (20 mg total) by mouth daily at 6 PM.     PRESERVISION AREDS 2 PO  Take 1 capsule by mouth 2 (two) times daily.     PROBIOTIC DAILY PO  Take 2 tablets by mouth daily.     Rivaroxaban 15 & 20 MG Tbpk  Commonly known as:  XARELTO STARTER PACK  Take as directed on package: Start with one 15mg  tablet by mouth  twice a day with food. On Day 22, switch to one 20mg  tablet once a day with food.     rivaroxaban 20 MG Tabs tablet  Commonly known as:  XARELTO  1 pill po daily at dinner.  Start this in 30 days after starter pack finished.     sodium chloride 0.65 % Soln nasal spray  Commonly known as:  OCEAN  Place 1 spray into both nostrils as needed for congestion (SIMPLY SALINE).     SYSTANE OP  Apply 1 drop to eye 3 (three) times daily as needed (dry eyes).       Allergies  Allergen Reactions  . Codeine   . Hydrocodone     CAUSES  NAUSEA  . Metoprolol   . Rythmol [Propafenone Hcl]  dizzy  . Sulfa Antibiotics        Follow-up Information    Follow up with Xu,Jindong, MD. Schedule an appointment as soon as possible for a visit in 2 months.   Specialty:  Neurology   Contact information:   [address] [place] [address] [phone]       Follow up with Mayra Neer, MD.   Specialty:  Family Medicine   Why:  As needed   Contact information:   [address] [place] [address] [phone]       Follow up with Darlin Coco, MD In 2 weeks.   Specialty:  Cardiology   Contact information:   [place] [place] [place] [address] [phone]        The results of significant diagnostics from this hospitalization (including imaging, microbiology, ancillary and laboratory) are listed below for reference.    Significant Diagnostic Studies: Ct Angio Head W/cm &/or Wo Cm  05/23/2014   CLINICAL DATA:  Expressive aphasia. Last seen normal 12:30 p.m. today.  EXAM: CT ANGIOGRAPHY HEAD AND NECK  TECHNIQUE: Multidetector CT imaging of the head and neck was performed using the standard protocol during bolus administration of intravenous contrast. Multiplanar CT image reconstructions and MIPs were obtained to evaluate the vascular anatomy. Carotid stenosis measurements (when applicable) are obtained utilizing NASCET criteria, using  the distal internal carotid diameter as the denominator.  CONTRAST:  54mL OMNIPAQUE IOHEXOL 350 MG/ML SOLN  COMPARISON:  MR head 09/24/2010.  FINDINGS: CT HEAD  Calvarium and skull base: No fracture or destructive lesion. Mastoids and middle ears are grossly clear.  Paranasal sinuses: Imaged portions are clear.  Orbits: Negative.  Brain: No evidence of acute abnormality, including acute infarct, hemorrhage, hydrocephalus, or mass lesion. Normal for age cerebral volume. Mild white matter disease.  CTA NECK  Aortic arch: Standard branching. Imaged portion shows no evidence of aneurysm or dissection. No significant stenosis of the major arch vessel origins.  Right carotid system: Minor atheromatous change. No evidence of dissection, stenosis (50% or greater) or occlusion.  Left carotid system: Minor atheromatous change. No evidence of dissection, stenosis (50% or greater) or occlusion.  Vertebral arteries: Codominant. No evidence of dissection, stenosis (50% or greater) or occlusion.  SOFT TISSUES: Spondylosis. Mild vascular congestion at the lung apices without pneumothorax or mass. No pneumothorax.  CTA HEAD  Anterior circulation: Minor atheromatous change in the carotid siphons. No significant stenosis, proximal occlusion, aneurysm, or vascular malformation.  Posterior circulation: No significant stenosis, proximal occlusion, aneurysm, or vascular malformation.  Venous sinuses: As permitted by contrast timing, patent.  Anatomic variants: None of significance.  Delayed phase:   No abnormal intracranial enhancement.  Critical Value/emergent results were called by telephone at the time of interpretation on 05/23/2014 at 7:15 pm to Dr. Alexis Goodell, who verbally acknowledged these results.  IMPRESSION: No evidence for large vessel occlusion.  Minor atheromatous change without extracranial or intracranial flow reducing lesion.  No evidence for LEFT MCA branch occlusion, or intracranial hemorrhage.  Normal for age  cerebral volume without significant changes of chronic ischemia.   Electronically Signed   By: Rolla Flatten M.D.   On: 05/23/2014 19:37   Ct Head Wo Contrast  05/24/2014   CLINICAL DATA:  Code stroke.  Aphasia.  Frontal headache.  EXAM: CT HEAD WITHOUT CONTRAST  TECHNIQUE: Contiguous axial images were obtained from the base of the skull through the vertex without intravenous contrast.  COMPARISON:  05/23/2014  FINDINGS: No mass lesion. No midline shift. No acute hemorrhage or hematoma. No extra-axial fluid collections. No evidence of acute infarction. Brain parenchyma is normal. No osseous abnormality.  IMPRESSION: No acute abnormalities.   Electronically Signed   By: Rozetta Nunnery M.D.   On: 05/24/2014 07:09   Ct Angio Neck W/cm &/or Wo/cm  05/23/2014   CLINICAL DATA:  Expressive aphasia. Last seen normal 12:30 p.m. today.  EXAM: CT ANGIOGRAPHY HEAD AND NECK  TECHNIQUE: Multidetector CT imaging of the head and neck was performed using the standard protocol during bolus administration of intravenous contrast. Multiplanar CT image reconstructions and MIPs were obtained to evaluate the vascular anatomy. Carotid stenosis measurements (when applicable) are obtained utilizing NASCET criteria, using the distal internal carotid diameter as the denominator.  CONTRAST:  81mL OMNIPAQUE IOHEXOL 350 MG/ML SOLN  COMPARISON:  MR head 09/24/2010.  FINDINGS: CT HEAD  Calvarium and skull base: No fracture or destructive lesion. Mastoids and middle ears are grossly clear.  Paranasal sinuses: Imaged portions are clear.  Orbits: Negative.  Brain: No evidence of acute abnormality, including acute infarct, hemorrhage, hydrocephalus, or mass lesion. Normal for age cerebral volume. Mild white matter disease.  CTA NECK  Aortic arch: Standard branching. Imaged portion shows no evidence of aneurysm or dissection. No significant stenosis of the major arch vessel origins.  Right carotid system: Minor atheromatous change. No evidence of  dissection, stenosis (50% or greater) or occlusion.  Left carotid system: Minor atheromatous change. No evidence of dissection, stenosis (50% or greater) or occlusion.  Vertebral arteries: Codominant. No evidence of dissection, stenosis (50% or greater) or occlusion.  SOFT TISSUES: Spondylosis. Mild vascular congestion at the lung apices without pneumothorax or mass. No pneumothorax.  CTA HEAD  Anterior circulation: Minor atheromatous change in the carotid siphons. No significant stenosis, proximal occlusion, aneurysm, or vascular malformation.  Posterior circulation: No significant stenosis, proximal occlusion, aneurysm, or vascular malformation.  Venous sinuses: As permitted by contrast timing, patent.  Anatomic variants: None of significance.  Delayed phase:   No abnormal intracranial enhancement.  Critical Value/emergent results were called by telephone at the time of interpretation on 05/23/2014 at 7:15 pm to Dr. Alexis Goodell, who verbally acknowledged these results.  IMPRESSION: No evidence for large vessel occlusion.  Minor atheromatous change without extracranial or intracranial flow reducing lesion.  No evidence for LEFT MCA branch occlusion, or intracranial hemorrhage.  Normal for age cerebral volume without significant changes of chronic ischemia.   Electronically Signed   By: Rolla Flatten M.D.   On: 05/23/2014 19:37   Mri Brain Without Contrast  05/24/2014   CLINICAL DATA:  Frontal headache.  aphasia.  Acute onset yesterday.  EXAM: MRI HEAD WITHOUT CONTRAST  TECHNIQUE: Multiplanar, multiecho pulse sequences of the brain and surrounding structures were obtained without intravenous contrast.  COMPARISON:  CT same day. CT angiography 05/23/2013. MRI 09/24/2010.  FINDINGS: There is a punctate acute infarction affecting the gyral surface of the postcentral gyrus in the high left anterior parietal region. No swelling or hemorrhage. No other acute infarction.  The brainstem and cerebellum are unremarkable.  The cerebral hemispheres elsewhere show minimal small vessel change of the white matter, less than often seen in healthy individuals of this age. No large vessel territory infarction. No mass lesion, acute hemorrhage, hydrocephalus or extra-axial collection. There is focal hemosiderin deposition in the external capsule region on the left related to an old small vessel infarction. No pituitary mass. No inflammatory sinus disease. No skull or skullbase  lesion. Major vessels at the base of the brain show flow.  IMPRESSION: Punctate acute infarction affecting the left postcentral gyrus. No swelling or hemorrhage.  Atrophy and mild chronic small-vessel disease elsewhere, less than often seen in healthy individuals of this age.   Electronically Signed   By: Nelson Chimes M.D.   On: 05/24/2014 10:51   Dg Chest Port 1 View  05/24/2014   CLINICAL DATA:  Speech difficulty, confusion, suspect TIA. ; history of diabetes and atrial fibrillation  EXAM: PORTABLE CHEST - 1 VIEW  COMPARISON:  PA and lateral chest of May 17, 2004  FINDINGS: The lungs remain hyperinflated but are clear. The cardiac silhouette is enlarged and has increased in size since the previous study. The pulmonary vascularity is normal. The mediastinum is normal in width. There is no pleural effusion. The bony thorax is unremarkable.  IMPRESSION: There is no CHF nor pneumonia. Mild hyperinflation may reflect COPD or reactive airway disease.   Electronically Signed   By: David  Martinique   On: 05/24/2014 07:50    Microbiology: Recent Results (from the past 240 hour(s))  Urine culture     Status: None   Collection Time: 05/23/14  8:23 PM  Result Value Ref Range Status   Specimen Description URINE, CLEAN CATCH  Final   Special Requests NONE  Final   Colony Count   Final    30,000 COLONIES/ML Performed at [facility]    Culture   Final    Multiple bacterial morphotypes present, none predominant. Suggest appropriate recollection if  clinically indicated. Performed at [facility]    Report Status 05/25/2014 FINAL  Final     Labs: Basic Metabolic Panel:  Recent Labs Lab 05/23/14 1848 05/24/14 0534  NA 138 137  K 4.5 3.8  CL 104 104  CO2 28 26  GLUCOSE 150* 144*  BUN 16 14  CREATININE 0.90 0.96  CALCIUM 9.1 8.6   Liver Function Tests:  Recent Labs Lab 05/23/14 1848 05/24/14 0534  AST 17 16  ALT 13 11  ALKPHOS 109 87  BILITOT 0.4 0.5  PROT 7.0 6.0  ALBUMIN 3.7 3.1*   No results for input(s): LIPASE, AMYLASE in the last 168 hours. No results for input(s): AMMONIA in the last 168 hours. CBC:  Recent Labs Lab 05/23/14 1848 05/24/14 0534  WBC 6.5 7.8  NEUTROABS 4.2 4.3  HGB 12.4 11.2*  HCT 38.6 35.0*  MCV 98.0 96.7  PLT 186 170   Cardiac Enzymes: No results for input(s): CKTOTAL, CKMB, CKMBINDEX, TROPONINI in the last 168 hours. BNP: BNP (last 3 results) No results for input(s): PROBNP in the last 8760 hours. CBG:  Recent Labs Lab 05/24/14 1147 05/24/14 1647 05/24/14 2237 05/25/14 0721 05/25/14 1148  GLUCAP 181* 161* 192* 119* 197*       Signed:  Adonay Scheier K  [facility] 05/25/2014, 8:14 PM

## 2014-05-25 NOTE — Progress Notes (Signed)
ANTICOAGULATION CONSULT NOTE - Initial Consult  Pharmacy Consult for Coumadin to Xarelto Indication: atrial fibrillation  Allergies  Allergen Reactions  . Codeine   . Hydrocodone     CAUSES  NAUSEA  . Metoprolol   . Rythmol [Propafenone Hcl]     dizzy  . Sulfa Antibiotics     Patient Measurements: Height: 5\' 4"  (162.6 cm) Weight: 171 lb 9.6 oz (77.837 kg) IBW/kg (Calculated) : 54.7  Vital Signs: Temp: 98.1 F (36.7 C) (01/07 0744) Temp Source: Oral (01/07 0744) BP: 160/70 mmHg (01/07 1027) Pulse Rate: 70 (01/07 0744)  Labs:  Recent Labs  05/23/14 1848 05/24/14 0534 05/25/14 0655  HGB 12.4 11.2*  --   HCT 38.6 35.0*  --   PLT 186 170  --   APTT 40*  --   --   LABPROT 22.1* 21.4* 18.2*  INR 1.91* 1.84* 1.50*  CREATININE 0.90 0.96  --     Estimated Creatinine Clearance: 36.9 mL/min (by C-G formula based on Cr of 0.96).   Medical History: Past Medical History  Diagnosis Date  . Chronic atrial fibrillation   . Chronic anticoagulation   . Hypothyroidism   . Sinus congestion   . Carpal tunnel syndrome   . Diabetes mellitus     NON-INSULIN     Assessment: 81 year old female on Coumadin prior to admission for Afib.  Now switching to Xarelto INR = 1.5 today  Goal of Therapy:   Monitor platelets by anticoagulation protocol: Yes   Plan:  Begin Xarelto 15 mg po daily starting at supper Continue to follow  Thank you. Anette Guarneri, PharmD [phone]  05/25/2014,11:25 AM

## 2014-05-29 ENCOUNTER — Telehealth: Payer: Self-pay | Admitting: Cardiology

## 2014-05-29 NOTE — Telephone Encounter (Signed)
Patient called regarding getting Xarelto free. Explained to patient that it would not be  Dr. Mare Ferrari just sending a letter stating that she needs medication. Advised patient it would require getting financial information to pharmaceutical company. Patient stated she will just pay the tier 3 copay

## 2014-05-29 NOTE — Telephone Encounter (Signed)
New Msg        Please return pt call, no details given.

## 2014-05-31 DIAGNOSIS — H60333 Swimmer's ear, bilateral: Secondary | ICD-10-CM | POA: Diagnosis not present

## 2014-06-06 ENCOUNTER — Ambulatory Visit: Payer: Medicare Other | Admitting: Pharmacist

## 2014-06-06 ENCOUNTER — Encounter: Payer: Self-pay | Admitting: Cardiology

## 2014-06-06 ENCOUNTER — Ambulatory Visit (INDEPENDENT_AMBULATORY_CARE_PROVIDER_SITE_OTHER): Payer: Medicare Other | Admitting: Cardiology

## 2014-06-06 VITALS — BP 136/64 | HR 98 | Ht 63.0 in | Wt 165.0 lb

## 2014-06-06 DIAGNOSIS — I119 Hypertensive heart disease without heart failure: Secondary | ICD-10-CM

## 2014-06-06 DIAGNOSIS — I639 Cerebral infarction, unspecified: Secondary | ICD-10-CM

## 2014-06-06 DIAGNOSIS — I4891 Unspecified atrial fibrillation: Secondary | ICD-10-CM

## 2014-06-06 DIAGNOSIS — I482 Chronic atrial fibrillation, unspecified: Secondary | ICD-10-CM

## 2014-06-06 DIAGNOSIS — Z5181 Encounter for therapeutic drug level monitoring: Secondary | ICD-10-CM

## 2014-06-06 NOTE — Progress Notes (Signed)
Amanda [name] Date of Birth:  31-Mar-1919 Amanda [name] [address] [name], [place] [name]  [address] [phone]        Fax   [phone]   History of Present Illness: This pleasant 81 year old lady is seen for a scheduled followup office visit. She has a history of established atrial fibrillation and is on long-term Coumadin. She does not have any history of ischemic heart disease. She had a normal nuclear stress test in 2006. She had an echocardiogram in 2009 which showed mild left atrial enlargement. The patient has a history of essential hypertension and diabetes and hypothyroidism. Since last visit she has been feeling well. She continues to live by herself in her own home. She still drives herself to her appointments and to the grocery store.  She has been under more stress since last visit.  Her 24 year old son who lives in [place] had a stroke and has kidney stones.  Her 95 year old daughter was diagnosed with breast cancer.  Since last visit she has had a lot of problems with her teeth and has had a crown replaced and has seen an endodontist and an Chief Financial Officer.  She also had a balloon procedure on her sinuses by Dr.Teoh. The patient had a recent hospital admission for 2 strokes which left her with initial difficulty with speaking.  Some swelling her speech has returned to normal.  However she feels very tired since her strokes.  She thinks that her vision is diminished since her strokes.  She also has a coexistent problem with her retina and sees a retinal specialist. Since last visit her appetite has not been as keen and she has lost 11 pounds.  Current Outpatient Prescriptions  Medication Sig Dispense Refill  . acetaminophen (TYLENOL) 500 MG tablet Take 1,000 mg by mouth every 6 (six) hours as needed for mild pain.     . fluticasone (FLONASE) 50 MCG/ACT nasal spray Place 2 sprays into the nose daily.     Marland Kitchen glimepiride (AMARYL) 1 MG tablet 1.5 mg daily with  breakfast.     . levothyroxine (SYNTHROID, LEVOTHROID) 88 MCG tablet Take 88 mcg by mouth daily.      Marland Kitchen lisinopril (PRINIVIL,ZESTRIL) 10 MG tablet Take 10 mg by mouth daily.      Marland Kitchen loratadine (CLARITIN) 10 MG tablet Take 10 mg by mouth daily as needed for allergies.     . Multiple Vitamins-Minerals (PRESERVISION AREDS 2 PO) Take 1 capsule by mouth 2 (two) times daily.     Vladimir Faster Glycol-Propyl Glycol 0.4-0.3 % SOLN Apply 1 drop to eye as needed.    . Probiotic Product (PROBIOTIC DAILY PO) Take 2 tablets by mouth as needed.     . Rivaroxaban (XARELTO STARTER PACK) 15 & 20 MG TBPK Take as directed on package: Start with one 15mg  tablet by mouth twice a day with food. On Day 22, switch to one 20mg  tablet once a day with food. 51 each 0  . sodium chloride (OCEAN) 0.65 % SOLN nasal spray Place 1 spray into both nostrils as needed for congestion (SIMPLY SALINE).      No current facility-administered medications for this visit.    Allergies  Allergen Reactions  . Codeine   . Hydrocodone     CAUSES  NAUSEA  . Metoprolol   . Rythmol [Propafenone Hcl]     dizzy  . Sulfa Antibiotics     Patient Active Problem List   Diagnosis Date Noted  .  Atrial fibrillation 09/03/2010    Priority: High  . Benign hypertensive heart disease without heart failure 10/09/2010    Priority: Medium  . Chronic anticoagulation 05/24/2014  . Acute CVA (cerebrovascular accident) 05/23/2014  . Chronic atrial fibrillation 05/23/2014  . Diabetes mellitus type 2, controlled 05/23/2014  . Slurred speech   . Encounter for therapeutic drug monitoring 08/19/2013  . Encounter for long-term (current) use of anticoagulants 02/26/2011  . Hypothyroidism 10/09/2010  . Type II or unspecified type diabetes mellitus without mention of complication, uncontrolled 10/09/2010    History  Smoking status  . Never Smoker   Smokeless tobacco  . Not on file    History  Alcohol Use No    Family History  Problem Relation Age  of Onset  . Stroke Mother   . Diabetes Sister   . Heart attack Neg Hx   . Hypertension    . Hypertension Mother     Review of Systems: Constitutional: no fever chills diaphoresis or fatigue or change in weight.  Head and neck: no hearing loss, no epistaxis, no photophobia or visual disturbance. Respiratory: No cough, shortness of breath or wheezing. Cardiovascular: No chest pain peripheral edema, palpitations. Gastrointestinal: No abdominal distention, no abdominal pain, no change in bowel habits hematochezia or melena. Genitourinary: No dysuria, no frequency, no urgency, no nocturia. Musculoskeletal:No arthralgias, no back pain, no gait disturbance or myalgias. Neurological: Positive for recent stroke with expressive aphasia, resolved Hematologic: No lymphadenopathy, no easy bruising. Psychiatric: No confusion, no hallucinations, no sleep disturbance.  EKG shows atrial fibrillation with ventricular response 76.  No ischemic changes.  Physical Exam: Filed Vitals:   06/06/14 1116  BP: 136/64  Pulse: 98  The patient appears to be in no distress.  Head and neck exam reveals that the pupils are equal and reactive.  The extraocular movements are full.  There is no scleral icterus.  Mouth and pharynx are benign.  No lymphadenopathy.  No carotid bruits.  The jugular venous pressure is normal.  Thyroid is not enlarged or tender.  Chest is clear to percussion and auscultation.  No rales or rhonchi.  Expansion of the chest is symmetrical.  Heart reveals no abnormal lift or heave.  First and second heart sounds are normal.  The pulse is irregularly irregular. There is no murmur gallop rub or click.  The abdomen is soft and nontender.  Bowel sounds are normoactive.  There is no hepatosplenomegaly or mass.  There are no abdominal bruits.  Extremities reveal no phlebitis or edema.  Pedal pulses are good.  There is no cyanosis or clubbing.  Neurologic exam is normal strength and no  lateralizing weakness.  No sensory deficits.  Integument reveals no rash    Assessment / Plan: 1.  Permanent atrial fibrillation 2. essential hypertension without congestive heart failure 3. Hypercholesterolemia 4. Hypothyroidism 5.  Recent stroke felt to be embolic occurring at a time that her INR was 1.9.  She is now on Xarelto instead of warfarin.  Plan: Continue same medication.  Recheck in 3 months for office visit EKG and basal metabolic panel and CBC

## 2014-06-06 NOTE — Patient Instructions (Signed)
Your physician recommends that you continue on your current medications as directed. Please refer to the Current Medication list given to you today.  Your physician recommends that you schedule a follow-up appointment in: 3 MONTH OV/EKG/BMET/CBC

## 2014-06-12 DIAGNOSIS — J301 Allergic rhinitis due to pollen: Secondary | ICD-10-CM | POA: Diagnosis not present

## 2014-06-12 DIAGNOSIS — K5790 Diverticulosis of intestine, part unspecified, without perforation or abscess without bleeding: Secondary | ICD-10-CM | POA: Diagnosis not present

## 2014-06-12 DIAGNOSIS — E782 Mixed hyperlipidemia: Secondary | ICD-10-CM | POA: Diagnosis not present

## 2014-06-12 DIAGNOSIS — I6340 Cerebral infarction due to embolism of unspecified cerebral artery: Secondary | ICD-10-CM | POA: Diagnosis not present

## 2014-06-12 DIAGNOSIS — K219 Gastro-esophageal reflux disease without esophagitis: Secondary | ICD-10-CM | POA: Diagnosis not present

## 2014-06-12 DIAGNOSIS — M8580 Other specified disorders of bone density and structure, unspecified site: Secondary | ICD-10-CM | POA: Diagnosis not present

## 2014-06-12 DIAGNOSIS — E039 Hypothyroidism, unspecified: Secondary | ICD-10-CM | POA: Diagnosis not present

## 2014-06-12 DIAGNOSIS — M150 Primary generalized (osteo)arthritis: Secondary | ICD-10-CM | POA: Diagnosis not present

## 2014-06-12 DIAGNOSIS — I481 Persistent atrial fibrillation: Secondary | ICD-10-CM | POA: Diagnosis not present

## 2014-06-12 DIAGNOSIS — E08359 Diabetes mellitus due to underlying condition with proliferative diabetic retinopathy without macular edema: Secondary | ICD-10-CM | POA: Diagnosis not present

## 2014-06-12 DIAGNOSIS — I10 Essential (primary) hypertension: Secondary | ICD-10-CM | POA: Diagnosis not present

## 2014-06-15 DIAGNOSIS — I4891 Unspecified atrial fibrillation: Secondary | ICD-10-CM | POA: Diagnosis not present

## 2014-06-15 DIAGNOSIS — M810 Age-related osteoporosis without current pathological fracture: Secondary | ICD-10-CM | POA: Diagnosis not present

## 2014-06-15 DIAGNOSIS — Z8673 Personal history of transient ischemic attack (TIA), and cerebral infarction without residual deficits: Secondary | ICD-10-CM | POA: Diagnosis not present

## 2014-06-15 DIAGNOSIS — E11319 Type 2 diabetes mellitus with unspecified diabetic retinopathy without macular edema: Secondary | ICD-10-CM | POA: Diagnosis not present

## 2014-06-15 DIAGNOSIS — H3530 Unspecified macular degeneration: Secondary | ICD-10-CM | POA: Diagnosis not present

## 2014-06-15 DIAGNOSIS — I10 Essential (primary) hypertension: Secondary | ICD-10-CM | POA: Diagnosis not present

## 2014-06-15 DIAGNOSIS — E039 Hypothyroidism, unspecified: Secondary | ICD-10-CM | POA: Diagnosis not present

## 2014-06-15 DIAGNOSIS — E1165 Type 2 diabetes mellitus with hyperglycemia: Secondary | ICD-10-CM | POA: Diagnosis not present

## 2014-06-15 DIAGNOSIS — Z7901 Long term (current) use of anticoagulants: Secondary | ICD-10-CM | POA: Diagnosis not present

## 2014-06-15 DIAGNOSIS — M1990 Unspecified osteoarthritis, unspecified site: Secondary | ICD-10-CM | POA: Diagnosis not present

## 2014-06-18 DIAGNOSIS — E11319 Type 2 diabetes mellitus with unspecified diabetic retinopathy without macular edema: Secondary | ICD-10-CM | POA: Diagnosis not present

## 2014-06-18 DIAGNOSIS — I4891 Unspecified atrial fibrillation: Secondary | ICD-10-CM | POA: Diagnosis not present

## 2014-06-18 DIAGNOSIS — Z8673 Personal history of transient ischemic attack (TIA), and cerebral infarction without residual deficits: Secondary | ICD-10-CM | POA: Diagnosis not present

## 2014-06-18 DIAGNOSIS — I10 Essential (primary) hypertension: Secondary | ICD-10-CM | POA: Diagnosis not present

## 2014-06-18 DIAGNOSIS — H3530 Unspecified macular degeneration: Secondary | ICD-10-CM | POA: Diagnosis not present

## 2014-06-18 DIAGNOSIS — E1165 Type 2 diabetes mellitus with hyperglycemia: Secondary | ICD-10-CM | POA: Diagnosis not present

## 2014-06-19 DIAGNOSIS — E11319 Type 2 diabetes mellitus with unspecified diabetic retinopathy without macular edema: Secondary | ICD-10-CM | POA: Diagnosis not present

## 2014-06-19 DIAGNOSIS — E1165 Type 2 diabetes mellitus with hyperglycemia: Secondary | ICD-10-CM | POA: Diagnosis not present

## 2014-06-19 DIAGNOSIS — Z8673 Personal history of transient ischemic attack (TIA), and cerebral infarction without residual deficits: Secondary | ICD-10-CM | POA: Diagnosis not present

## 2014-06-19 DIAGNOSIS — I10 Essential (primary) hypertension: Secondary | ICD-10-CM | POA: Diagnosis not present

## 2014-06-19 DIAGNOSIS — I4891 Unspecified atrial fibrillation: Secondary | ICD-10-CM | POA: Diagnosis not present

## 2014-06-19 DIAGNOSIS — H3530 Unspecified macular degeneration: Secondary | ICD-10-CM | POA: Diagnosis not present

## 2014-06-21 DIAGNOSIS — H3530 Unspecified macular degeneration: Secondary | ICD-10-CM | POA: Diagnosis not present

## 2014-06-21 DIAGNOSIS — E1165 Type 2 diabetes mellitus with hyperglycemia: Secondary | ICD-10-CM | POA: Diagnosis not present

## 2014-06-21 DIAGNOSIS — E11319 Type 2 diabetes mellitus with unspecified diabetic retinopathy without macular edema: Secondary | ICD-10-CM | POA: Diagnosis not present

## 2014-06-21 DIAGNOSIS — I10 Essential (primary) hypertension: Secondary | ICD-10-CM | POA: Diagnosis not present

## 2014-06-21 DIAGNOSIS — I4891 Unspecified atrial fibrillation: Secondary | ICD-10-CM | POA: Diagnosis not present

## 2014-06-21 DIAGNOSIS — Z8673 Personal history of transient ischemic attack (TIA), and cerebral infarction without residual deficits: Secondary | ICD-10-CM | POA: Diagnosis not present

## 2014-06-28 DIAGNOSIS — E11319 Type 2 diabetes mellitus with unspecified diabetic retinopathy without macular edema: Secondary | ICD-10-CM | POA: Diagnosis not present

## 2014-06-28 DIAGNOSIS — E1165 Type 2 diabetes mellitus with hyperglycemia: Secondary | ICD-10-CM | POA: Diagnosis not present

## 2014-06-28 DIAGNOSIS — I4891 Unspecified atrial fibrillation: Secondary | ICD-10-CM | POA: Diagnosis not present

## 2014-06-28 DIAGNOSIS — Z8673 Personal history of transient ischemic attack (TIA), and cerebral infarction without residual deficits: Secondary | ICD-10-CM | POA: Diagnosis not present

## 2014-06-28 DIAGNOSIS — I10 Essential (primary) hypertension: Secondary | ICD-10-CM | POA: Diagnosis not present

## 2014-06-28 DIAGNOSIS — H3530 Unspecified macular degeneration: Secondary | ICD-10-CM | POA: Diagnosis not present

## 2014-07-03 DIAGNOSIS — H3530 Unspecified macular degeneration: Secondary | ICD-10-CM | POA: Diagnosis not present

## 2014-07-03 DIAGNOSIS — E11319 Type 2 diabetes mellitus with unspecified diabetic retinopathy without macular edema: Secondary | ICD-10-CM | POA: Diagnosis not present

## 2014-07-03 DIAGNOSIS — Z8673 Personal history of transient ischemic attack (TIA), and cerebral infarction without residual deficits: Secondary | ICD-10-CM | POA: Diagnosis not present

## 2014-07-03 DIAGNOSIS — I4891 Unspecified atrial fibrillation: Secondary | ICD-10-CM | POA: Diagnosis not present

## 2014-07-03 DIAGNOSIS — E1165 Type 2 diabetes mellitus with hyperglycemia: Secondary | ICD-10-CM | POA: Diagnosis not present

## 2014-07-03 DIAGNOSIS — I10 Essential (primary) hypertension: Secondary | ICD-10-CM | POA: Diagnosis not present

## 2014-07-12 ENCOUNTER — Telehealth: Payer: Self-pay | Admitting: Cardiology

## 2014-07-12 NOTE — Telephone Encounter (Signed)
New Message       Pt calling stating that she needs for Dr. Mare Ferrari to talk to her insurance company in regards to getting her in the Tier exception program for Amanda [name] [employer] [phone]. Pt would like a call back from [place] in regards to this.

## 2014-07-14 NOTE — Telephone Encounter (Signed)
Follow up      Calling to get an update on the xarelto problem

## 2014-07-14 NOTE — Telephone Encounter (Signed)
Spoke with patient in great length regarding Tier exception (was denied by insurance) Recommended patient pay out of pocket at [facility] for all other medications and not have it run on her insurance.  This may keep her out of the donut hole Patient will have her daughter call and next week to discuss further Keep [facility] appointment

## 2014-07-26 ENCOUNTER — Telehealth: Payer: Self-pay | Admitting: *Deleted

## 2014-07-26 DIAGNOSIS — R040 Epistaxis: Secondary | ICD-10-CM | POA: Diagnosis not present

## 2014-07-26 NOTE — Telephone Encounter (Signed)
Patient phoned c/o having blood in her mouth in the morning and throughout the day.  When she blows her nose "lightly" she has some blood.  Both mouth and nose bleeding started around Monday Last week she did have rectal bleeding Patient has had hemorrhoid surgery and no problems since Discussed with  Dr. Mare Ferrari and will have patient hold her Xarelto for 2 days and contact PCP regarding rectal bleeding. When spoke with patient to give her  Dr. Sherryl Barters recommendations she explained that she had nasal balloon procedure (simplasty) on August 12th,  Advised patient that she needed to hold her Xarelto and contact MD that did her procedure.  Patient verbalized understanding

## 2014-08-16 DIAGNOSIS — I10 Essential (primary) hypertension: Secondary | ICD-10-CM | POA: Diagnosis not present

## 2014-08-16 DIAGNOSIS — E782 Mixed hyperlipidemia: Secondary | ICD-10-CM | POA: Diagnosis not present

## 2014-08-16 DIAGNOSIS — Z23 Encounter for immunization: Secondary | ICD-10-CM | POA: Diagnosis not present

## 2014-08-16 DIAGNOSIS — I481 Persistent atrial fibrillation: Secondary | ICD-10-CM | POA: Diagnosis not present

## 2014-08-16 DIAGNOSIS — E08359 Diabetes mellitus due to underlying condition with proliferative diabetic retinopathy without macular edema: Secondary | ICD-10-CM | POA: Diagnosis not present

## 2014-08-16 DIAGNOSIS — E039 Hypothyroidism, unspecified: Secondary | ICD-10-CM | POA: Diagnosis not present

## 2014-08-17 ENCOUNTER — Encounter: Payer: Self-pay | Admitting: Cardiology

## 2014-08-23 DIAGNOSIS — J310 Chronic rhinitis: Secondary | ICD-10-CM | POA: Diagnosis not present

## 2014-08-23 DIAGNOSIS — H903 Sensorineural hearing loss, bilateral: Secondary | ICD-10-CM | POA: Diagnosis not present

## 2014-08-23 DIAGNOSIS — H6983 Other specified disorders of Eustachian tube, bilateral: Secondary | ICD-10-CM | POA: Diagnosis not present

## 2014-08-23 DIAGNOSIS — R040 Epistaxis: Secondary | ICD-10-CM | POA: Diagnosis not present

## 2014-09-01 ENCOUNTER — Telehealth: Payer: Self-pay | Admitting: Cardiology

## 2014-09-01 NOTE — Telephone Encounter (Signed)
error 

## 2014-09-05 ENCOUNTER — Encounter: Payer: Self-pay | Admitting: Cardiology

## 2014-09-05 ENCOUNTER — Ambulatory Visit (INDEPENDENT_AMBULATORY_CARE_PROVIDER_SITE_OTHER): Payer: Medicare Other | Admitting: Cardiology

## 2014-09-05 VITALS — BP 142/66 | HR 76 | Ht 62.0 in | Wt 169.0 lb

## 2014-09-05 DIAGNOSIS — I482 Chronic atrial fibrillation, unspecified: Secondary | ICD-10-CM

## 2014-09-05 DIAGNOSIS — I119 Hypertensive heart disease without heart failure: Secondary | ICD-10-CM | POA: Diagnosis not present

## 2014-09-05 DIAGNOSIS — E039 Hypothyroidism, unspecified: Secondary | ICD-10-CM | POA: Diagnosis not present

## 2014-09-05 DIAGNOSIS — I639 Cerebral infarction, unspecified: Secondary | ICD-10-CM

## 2014-09-05 NOTE — Progress Notes (Signed)
Cardiology Office Note   Date:  09/05/2014   ID:  Amanda [name], DOB 1918/10/09, MRN [id number]  PCP:  Mayra Neer, MD  Cardiologist:   Darlin Coco, MD   No chief complaint on file.     History of Present Illness: Amanda [name] is a 81 y.o. female who presents for scheduled follow-up office visit  This pleasant 81 year old lady is seen for a scheduled followup office visit. She has a history of established atrial fibrillation and is on long-term Coumadin. She does not have any history of ischemic heart disease. She had a normal nuclear stress test in 2006. She had an echocardiogram in 2009 which showed mild left atrial enlargement. The patient has a history of essential hypertension and diabetes and hypothyroidism. Since last visit she has been feeling well. She continues to live by herself in her own home. She still drives herself to her appointments and to the grocery store. She has been under more stress since last visit. Her 51 year old son who lives in [place] had a stroke and has kidney stones. Her 14 year old daughter was diagnosed with breast cancer. Since last visit she has had a lot of problems with her teeth and has had a crown replaced and has seen an endodontist and an Chief Financial Officer. She also had a balloon procedure on her sinuses by Dr.Teoh. The patient had a recent hospital admission for 2 strokes which left her with initial difficulty with speaking. Some swelling her speech has returned to normal. However she feels very tired since her strokes. She thinks that her vision is diminished since her strokes. She also has a coexistent problem with her retina and sees a retinal specialist. Since last visit her appetite has not been as keen and she has lost 11 pounds.  Past Medical History  Diagnosis Date  . Chronic atrial fibrillation   . Chronic anticoagulation   . Hypothyroidism   . Sinus congestion   . Carpal tunnel syndrome   . Diabetes  mellitus     NON-INSULIN    Past Surgical History  Procedure Laterality Date  . Cholecystectomy    . Appendectomy    . US echocardiography  12/06/2007    EF 55-60%  . Cardiovascular stress test  07/10/2004    EF 81%     Current Outpatient Prescriptions  Medication Sig Dispense Refill  . acetaminophen (TYLENOL) 500 MG tablet Take 1,000 mg by mouth every 6 (six) hours as needed for mild pain.     . fluticasone (FLONASE) 50 MCG/ACT nasal spray Place 2 sprays into the nose daily.     Marland Kitchen glimepiride (AMARYL) 1 MG tablet 1.5 mg daily with breakfast.     . levothyroxine (SYNTHROID, LEVOTHROID) 88 MCG tablet Take 88 mcg by mouth daily.      Marland Kitchen lisinopril (PRINIVIL,ZESTRIL) 10 MG tablet Take 10 mg by mouth daily.      Marland Kitchen loratadine (CLARITIN) 10 MG tablet Take 10 mg by mouth daily as needed for allergies.     . Multiple Vitamins-Minerals (PRESERVISION AREDS 2 PO) Take 1 capsule by mouth 2 (two) times daily.     Vladimir Faster Glycol-Propyl Glycol 0.4-0.3 % SOLN Apply 1 drop to eye as needed.    . Probiotic Product (PROBIOTIC DAILY PO) Take 2 tablets by mouth as needed.     . sodium chloride (OCEAN) 0.65 % SOLN nasal spray Place 1 spray into both nostrils as needed for congestion (SIMPLY SALINE).     Marland Kitchen XARELTO 20 MG  TABS tablet Take 20 mg by mouth at bedtime.   3   No current facility-administered medications for this visit.    Allergies:   Codeine; Hydrocodone; Metoprolol; Rythmol; and Sulfa antibiotics    Social History:  The patient  reports that she has never smoked. She does not have any smokeless tobacco history on file. She reports that she does not drink alcohol or use illicit drugs.   Family History:  The patient's family history includes Diabetes in her sister; Hypertension in her mother and another family member; Stroke in her mother. There is no history of Heart attack.    ROS:  Please see the history of present illness.   Otherwise, review of systems are positive for none.   All  other systems are reviewed and negative.    PHYSICAL EXAM: VS:  BP 142/66 mmHg  Pulse 76  Ht 5\' 2"  (1.575 m)  Wt 169 lb (76.658 kg)  BMI 30.90 kg/m2 , BMI Body mass index is 30.9 kg/(m^2). GEN: Well nourished, well developed, in no acute distress HEENT: normal Neck: no JVD, carotid bruits, or masses Cardiac: Irregularly irregular; no murmurs, rubs, or gallops,no edema  Respiratory:  clear to auscultation bilaterally, normal work of breathing GI: soft, nontender, nondistended, + BS MS: no deformity or atrophy Skin: warm and dry, no rash Neuro:  Strength and sensation are intact Psych: euthymic mood, full affect   EKG:  EKG is ordered today. The ekg ordered today demonstrates atrial fibrillation with controlled ventricular response at 76.   Recent Labs: 05/24/2014: ALT 11; BUN 14; Creatinine 0.96; Hemoglobin 11.2*; Platelets 170; Potassium 3.8; Sodium 137; TSH 1.750    Lipid Panel    Component Value Date/Time   CHOL 189 05/24/2014 0534   TRIG 147 05/24/2014 0534   HDL 47 05/24/2014 0534   CHOLHDL 4.0 05/24/2014 0534   VLDL 29 05/24/2014 0534   LDLCALC 113* 05/24/2014 0534   LDLDIRECT 137.0 01/06/2012 1211      Wt Readings from Last 3 Encounters:  09/05/14 169 lb (76.658 kg)  06/06/14 165 lb (74.844 kg)  05/23/14 171 lb 9.6 oz (77.837 kg)        ASSESSMENT AND PLAN:  1. Permanent atrial fibrillation 2. essential hypertension without congestive heart failure 3. Hypercholesterolemia 4. Hypothyroidism 5. Recent stroke felt to be embolic occurring at a time that her INR was 1.9. She is now on Xarelto instead of warfarin.  Plan: Continue same medication. Recheck in 3 months for office visit EKG and basal metabolic panel and CBC   Current medicines are reviewed at length with the patient today.  The patient does not have concerns regarding medicines.  The following changes have been made:  no change  Labs/ tests ordered today include:   Orders Placed This  Encounter  Procedures  . EKG 12-Lead     Disposition: Continue current medication.  Recheck in 3 months.  He had recent lab work at her PCP office so we did not have any blood work on him today  Signed, Darlin Coco, MD  09/05/2014 1:37 PM    [place], [place],   [address] Phone: [phone]; Fax: [phone]

## 2014-09-05 NOTE — Patient Instructions (Signed)
Medication Instructions:  Your physician recommends that you continue on your current medications as directed. Please refer to the Current Medication list given to you today.  Labwork: NONE  Testing/Procedures: NONE  Follow-Up: Your physician wants you to follow-up in: 3 MONTH OV You will receive a reminder letter in the mail two months in advance. If you don't receive a letter, please call our office to schedule the follow-up appointment.

## 2014-10-23 ENCOUNTER — Other Ambulatory Visit: Payer: Self-pay | Admitting: Cardiology

## 2014-10-24 DIAGNOSIS — H43813 Vitreous degeneration, bilateral: Secondary | ICD-10-CM | POA: Diagnosis not present

## 2014-10-24 DIAGNOSIS — H3531 Nonexudative age-related macular degeneration: Secondary | ICD-10-CM | POA: Diagnosis not present

## 2014-10-24 DIAGNOSIS — E11329 Type 2 diabetes mellitus with mild nonproliferative diabetic retinopathy without macular edema: Secondary | ICD-10-CM | POA: Diagnosis not present

## 2014-11-07 DIAGNOSIS — H35372 Puckering of macula, left eye: Secondary | ICD-10-CM | POA: Diagnosis not present

## 2014-11-07 DIAGNOSIS — E11329 Type 2 diabetes mellitus with mild nonproliferative diabetic retinopathy without macular edema: Secondary | ICD-10-CM | POA: Diagnosis not present

## 2014-11-07 DIAGNOSIS — Z961 Presence of intraocular lens: Secondary | ICD-10-CM | POA: Diagnosis not present

## 2014-11-07 DIAGNOSIS — H52203 Unspecified astigmatism, bilateral: Secondary | ICD-10-CM | POA: Diagnosis not present

## 2014-11-22 ENCOUNTER — Telehealth: Payer: Self-pay | Admitting: Cardiology

## 2014-11-22 MED ORDER — RIVAROXABAN 20 MG PO TABS: 20.0000 mg | ORAL_TABLET | Freq: Every day | ORAL | Status: DC

## 2014-11-22 NOTE — Telephone Encounter (Signed)
Called, no answer.. will try again later.

## 2014-11-22 NOTE — Telephone Encounter (Signed)
New message    Patient calling want to discuss xarelto medication. Wants Dr. Mare Ferrari to prescribe her this medication.    Recently in hospital .

## 2014-11-22 NOTE — Telephone Encounter (Signed)
Refilled Xarelto as requested

## 2014-12-12 ENCOUNTER — Encounter: Payer: Self-pay | Admitting: Cardiology

## 2014-12-12 ENCOUNTER — Ambulatory Visit (INDEPENDENT_AMBULATORY_CARE_PROVIDER_SITE_OTHER): Payer: Medicare Other | Admitting: Cardiology

## 2014-12-12 VITALS — BP 142/80 | HR 87 | Ht 62.0 in | Wt 169.2 lb

## 2014-12-12 DIAGNOSIS — I119 Hypertensive heart disease without heart failure: Secondary | ICD-10-CM | POA: Diagnosis not present

## 2014-12-12 DIAGNOSIS — I482 Chronic atrial fibrillation, unspecified: Secondary | ICD-10-CM

## 2014-12-12 DIAGNOSIS — I639 Cerebral infarction, unspecified: Secondary | ICD-10-CM | POA: Diagnosis not present

## 2014-12-12 NOTE — Patient Instructions (Signed)
Medication Instructions:  Your physician recommends that you continue on your current medications as directed. Please refer to the Current Medication list given to you today.  Labwork: NONE  Testing/Procedures: NONE  Follow-Up: Your physician recommends that you schedule a follow-up appointment in: 3 MONTH OV

## 2014-12-12 NOTE — Progress Notes (Signed)
Cardiology Office Note   Date:  12/12/2014   ID:  Serita Kyle Pepperman, DOB August 08, 1918, MRN [id number]  PCP:  Mayra Neer, MD  Cardiologist: Darlin Coco MD  No chief complaint on file.     History of Present Illness: Amanda [name] is a 81 y.o. female who presents for 3 month follow-up office visit  This pleasant 81 year old lady is seen for a scheduled followup office visit. She has a history of established atrial fibrillation and is on long-term anticoagulation.. She does not have any history of ischemic heart disease. She had a normal nuclear stress test in 2006. She had an echocardiogram in 2009 which showed mild left atrial enlargement. The patient has a history of essential hypertension and diabetes and hypothyroidism. Since last visit she has been feeling well. She continues to live by herself in her own home. She still drives herself to her appointments and to the grocery store. She has been under more stress since last visit. Her 40 year old son who lives in [place] had a stroke and has kidney stones. Her 3 year old daughter was diagnosed with breast cancer. Her daughter is refusing chemotherapy and this has caused the patient to be quite upset. The patient had a recent hospital admission for 2 strokes which left her with initial difficulty with speaking.Her speech has subsequently returned to normal.  The patient is now on Xarelto rather than warfarin.  The cost of Xarelto is a potential problem for her.  She is going to apply for financial assistance. She denies any new cardiac symptoms.  No chest pain.  No shortness of breath.  No dizziness or syncope.  No awareness of rapid palpitations.   Past Medical History  Diagnosis Date  . Chronic atrial fibrillation   . Chronic anticoagulation   . Hypothyroidism   . Sinus congestion   . Carpal tunnel syndrome   . Diabetes mellitus     NON-INSULIN    Past Surgical History  Procedure Laterality Date  .  Cholecystectomy    . Appendectomy    . US echocardiography  12/06/2007    EF 55-60%  . Cardiovascular stress test  07/10/2004    EF 81%     Current Outpatient Prescriptions  Medication Sig Dispense Refill  . acetaminophen (TYLENOL) 500 MG tablet Take 1,000 mg by mouth every 6 (six) hours as needed for mild pain.     . fluticasone (FLONASE) 50 MCG/ACT nasal spray Place 2 sprays into the nose daily.     Marland Kitchen glimepiride (AMARYL) 1 MG tablet 1.5 mg daily with breakfast.     . levothyroxine (SYNTHROID, LEVOTHROID) 88 MCG tablet Take 88 mcg by mouth daily.      Marland Kitchen lisinopril (PRINIVIL,ZESTRIL) 10 MG tablet Take 10 mg by mouth daily.      Marland Kitchen loratadine (CLARITIN) 10 MG tablet Take 10 mg by mouth daily as needed for allergies.     . Multiple Vitamins-Minerals (PRESERVISION AREDS 2 PO) Take 1 capsule by mouth 2 (two) times daily.     . Omega-3 Fatty Acids (FISH OIL) 1000 MG CAPS Take 1 capsule by mouth every morning.    Vladimir Faster Glycol-Propyl Glycol 0.4-0.3 % SOLN Place 1 drop into both eyes as needed (DRY EYES).     . Probiotic Product (PROBIOTIC DAILY PO) Take 2 tablets by mouth as needed (DIGESTION).     Marland Kitchen rivaroxaban (XARELTO) 20 MG TABS tablet Take 1 tablet (20 mg total) by mouth daily with supper. 30 tablet 5  . sodium  chloride (OCEAN) 0.65 % SOLN nasal spray Place 1 spray into both nostrils as needed for congestion (SIMPLY SALINE).      No current facility-administered medications for this visit.    Allergies:   Codeine; Hydrocodone; Metoprolol; Rythmol; and Sulfa antibiotics    Social History:  The patient  reports that she has never smoked. She has never used smokeless tobacco. She reports that she does not drink alcohol or use illicit drugs.   Family History:  The patient's family history includes Diabetes in her sister; Hypertension in her mother and another family member; Stroke in her mother. There is no history of Heart attack.    ROS:  Please see the history of present illness.    Otherwise, review of systems are positive for none.   All other systems are reviewed and negative.    PHYSICAL EXAM: VS:  BP 142/80 mmHg  Pulse 87  Ht 5\' 2"  (1.575 m)  Wt 169 lb 3.2 oz (76.749 kg)  BMI 30.94 kg/m2 , BMI Body mass index is 30.94 kg/(m^2). GEN: Well nourished, well developed, in no acute distress HEENT: normal Neck: no JVD, carotid bruits, or masses Cardiac: Irregularly irregular; no murmurs, rubs, or gallops,no edema  Respiratory:  clear to auscultation bilaterally, normal work of breathing GI: soft, nontender, nondistended, + BS MS: no deformity or atrophy Skin: warm and dry, no rash Neuro:  Strength and sensation are intact Psych: euthymic mood, full affect   EKG:  EKG is ordered today. The ekg ordered today demonstrates atrial fibrillation with controlled ventricular response at 87 bpm   Recent Labs: 05/24/2014: ALT 11; BUN 14; Creatinine, Ser 0.96; Hemoglobin 11.2*; Platelets 170; Potassium 3.8; Sodium 137; TSH 1.750    Lipid Panel    Component Value Date/Time   CHOL 189 05/24/2014 0534   TRIG 147 05/24/2014 0534   HDL 47 05/24/2014 0534   CHOLHDL 4.0 05/24/2014 0534   VLDL 29 05/24/2014 0534   LDLCALC 113* 05/24/2014 0534   LDLDIRECT 137.0 01/06/2012 1211      Wt Readings from Last 3 Encounters:  12/12/14 169 lb 3.2 oz (76.749 kg)  09/05/14 169 lb (76.658 kg)  06/06/14 165 lb (74.844 kg)         ASSESSMENT AND PLAN:  1. Permanent atrial fibrillation 2. essential hypertension without congestive heart failure 3. Hypercholesterolemia 4. Hypothyroidism 5. Recent stroke felt to be embolic occurring at a time that her INR was 1.9. She is now on Xarelto instead of warfarin.  Plan: Continue same medication. Recheck in 3 months for office visit   Current medicines are reviewed at length with the patient today.  The patient does not have concerns regarding medicines.  The following changes have been made:  no change  Labs/ tests ordered  today include:   Orders Placed This Encounter  Procedures  . EKG 12-Lead    Disposition: Continue current medication.  Recheck in 3 months for office visit.  Berna Spare MD 12/12/2014 1:34 PM    [facility] [place], [place], [place]  [address] Phone: [phone]; Fax: [phone]

## 2014-12-26 ENCOUNTER — Telehealth: Payer: Self-pay | Admitting: Cardiology

## 2014-12-26 NOTE — Telephone Encounter (Signed)
New Message  Pt requested to speak w/ RN. Pt would not specify. Please call back and discuss.

## 2014-12-26 NOTE — Telephone Encounter (Signed)
4 bottles of Xarelto 20 mg left at front for pick up  Patient aware

## 2015-01-24 ENCOUNTER — Telehealth: Payer: Self-pay | Admitting: Cardiology

## 2015-01-24 NOTE — Telephone Encounter (Signed)
New message ° ° ° ° ° °Want samples of xarelto. °

## 2015-01-26 DIAGNOSIS — R195 Other fecal abnormalities: Secondary | ICD-10-CM | POA: Diagnosis not present

## 2015-01-26 DIAGNOSIS — I10 Essential (primary) hypertension: Secondary | ICD-10-CM | POA: Diagnosis not present

## 2015-01-26 DIAGNOSIS — M791 Myalgia: Secondary | ICD-10-CM | POA: Diagnosis not present

## 2015-01-26 DIAGNOSIS — Z23 Encounter for immunization: Secondary | ICD-10-CM | POA: Diagnosis not present

## 2015-01-26 NOTE — Telephone Encounter (Signed)
Xarelto 20mg  samples, 4 bottles provided.

## 2015-01-29 ENCOUNTER — Telehealth: Payer: Self-pay | Admitting: Cardiology

## 2015-01-29 NOTE — Telephone Encounter (Signed)
Spoke with patient and Dr Brigitte Pulse d/c her Lisinopril secondary to her feeling poorly Her blood pressure has been going up and she just spoke to Dr Raul Del office and Dr Brigitte Pulse adding Losartan daily Patient unsure of dose of Losartan. Advised her to call back tomorrow and leave dose of Losartan so could change in chart  Will forward to  Dr. Mare Ferrari for review

## 2015-01-29 NOTE — Telephone Encounter (Signed)
Agree with switch to losartan

## 2015-01-29 NOTE — Telephone Encounter (Signed)
New message      PCP took pt off lisinopril for 2 weeks because of "pain" in her body.   Is there any reason Dr Mare Ferrari does not agree with this?  Her bp today is 173/56.  Will this affect her xarelto? (Pt has been off rx for 3 days)

## 2015-02-12 DIAGNOSIS — E11319 Type 2 diabetes mellitus with unspecified diabetic retinopathy without macular edema: Secondary | ICD-10-CM | POA: Diagnosis not present

## 2015-02-12 DIAGNOSIS — M791 Myalgia: Secondary | ICD-10-CM | POA: Diagnosis not present

## 2015-02-12 DIAGNOSIS — G4700 Insomnia, unspecified: Secondary | ICD-10-CM | POA: Diagnosis not present

## 2015-02-12 DIAGNOSIS — I10 Essential (primary) hypertension: Secondary | ICD-10-CM | POA: Diagnosis not present

## 2015-02-12 DIAGNOSIS — E782 Mixed hyperlipidemia: Secondary | ICD-10-CM | POA: Diagnosis not present

## 2015-02-12 DIAGNOSIS — E039 Hypothyroidism, unspecified: Secondary | ICD-10-CM | POA: Diagnosis not present

## 2015-02-12 DIAGNOSIS — I481 Persistent atrial fibrillation: Secondary | ICD-10-CM | POA: Diagnosis not present

## 2015-02-20 ENCOUNTER — Telehealth: Payer: Self-pay | Admitting: Cardiology

## 2015-02-20 NOTE — Telephone Encounter (Signed)
New Message  Pt calling about xarelto samples, please call back and discuss.

## 2015-02-21 DIAGNOSIS — J310 Chronic rhinitis: Secondary | ICD-10-CM | POA: Diagnosis not present

## 2015-02-21 DIAGNOSIS — R040 Epistaxis: Secondary | ICD-10-CM | POA: Diagnosis not present

## 2015-02-22 ENCOUNTER — Telehealth: Payer: Self-pay

## 2015-02-22 NOTE — Telephone Encounter (Signed)
Provided 1 month supply of Xarelto 20 mg samples and information for patient assistance.

## 2015-03-13 DIAGNOSIS — H903 Sensorineural hearing loss, bilateral: Secondary | ICD-10-CM | POA: Diagnosis not present

## 2015-03-19 ENCOUNTER — Ambulatory Visit (INDEPENDENT_AMBULATORY_CARE_PROVIDER_SITE_OTHER): Payer: Medicare Other | Admitting: Cardiology

## 2015-03-19 ENCOUNTER — Encounter: Payer: Self-pay | Admitting: Cardiology

## 2015-03-19 VITALS — BP 134/80 | HR 90 | Ht 67.0 in | Wt 172.6 lb

## 2015-03-19 DIAGNOSIS — E039 Hypothyroidism, unspecified: Secondary | ICD-10-CM

## 2015-03-19 DIAGNOSIS — I639 Cerebral infarction, unspecified: Secondary | ICD-10-CM

## 2015-03-19 DIAGNOSIS — I482 Chronic atrial fibrillation, unspecified: Secondary | ICD-10-CM

## 2015-03-19 DIAGNOSIS — I119 Hypertensive heart disease without heart failure: Secondary | ICD-10-CM | POA: Diagnosis not present

## 2015-03-19 NOTE — Patient Instructions (Signed)
Medication Instructions:  Your physician recommends that you continue on your current medications as directed. Please refer to the Current Medication list given to you today.  Labwork: none  Testing/Procedures: none  Follow-Up: Your physician recommends that you schedule a follow-up appointment in: 3 month ov  If you need a refill on your cardiac medications before your next appointment, please call your pharmacy.  

## 2015-03-19 NOTE — Progress Notes (Signed)
Cardiology Office Note   Date:  03/19/2015   ID:  Serita Kyle Pries, DOB 10/21/1918, MRN [id number]  PCP:  Mayra Neer, MD  Cardiologist: Darlin Coco MD  No chief complaint on file.     History of Present Illness: Amanda [name] is a 81 y.o. female who presents for a scheduled follow-up office visit.  This pleasant 81 year old lady is seen for a scheduled followup office visit. She has a history of established atrial fibrillation and is on long-term anticoagulation.. She does not have any history of ischemic heart disease. She had a normal nuclear stress test in 2006. She had an echocardiogram in 2009 which showed mild left atrial enlargement. The patient has a history of essential hypertension and diabetes and hypothyroidism. Since last visit she has been feeling well. She continues to live by herself in her own home. She still drives herself to her appointments and to the grocery store. She has been under more stress since last visit. Her 33 year old son who lives in [place] had a stroke and has kidney stones. Her 26 year old daughter was diagnosed with breast cancer. Her daughter is refusing chemotherapy and this has caused the patient to be quite upset. The patient had a recent hospital admission for 2 strokes which left her with initial difficulty with speaking.Her speech has subsequently returned to normal. The patient is now on Xarelto rather than warfarin. She denies any new cardiac symptoms. No chest pain. No shortness of breath. No dizziness or syncope. No awareness of rapid palpitations.  She still drives and lives by herself.   She complains of her body hurting all over.  She has made her PCP aware of this.  She takes acetaminophen with some improvement.   Past Medical History  Diagnosis Date  . Chronic atrial fibrillation ([place])   . Chronic anticoagulation   . Hypothyroidism   . Sinus congestion   . Carpal tunnel syndrome   . Diabetes mellitus      NON-INSULIN    Past Surgical History  Procedure Laterality Date  . Cholecystectomy    . Appendectomy    . US echocardiography  12/06/2007    EF 55-60%  . Cardiovascular stress test  07/10/2004    EF 81%     Current Outpatient Prescriptions  Medication Sig Dispense Refill  . acetaminophen (TYLENOL) 500 MG tablet Take 1,000 mg by mouth every 6 (six) hours as needed for mild pain.     . fluticasone (FLONASE) 50 MCG/ACT nasal spray Place 2 sprays into the nose daily.     Marland Kitchen glimepiride (AMARYL) 1 MG tablet 1.5 mg daily with breakfast.     . levothyroxine (SYNTHROID, LEVOTHROID) 88 MCG tablet Take 88 mcg by mouth daily.      Marland Kitchen lisinopril (PRINIVIL,ZESTRIL) 10 MG tablet Take 10 mg by mouth daily.      Marland Kitchen loratadine (CLARITIN) 10 MG tablet Take 10 mg by mouth daily as needed for allergies.     . Multiple Vitamins-Minerals (PRESERVISION AREDS 2 PO) Take 1 capsule by mouth 2 (two) times daily.     . Omega-3 Fatty Acids (FISH OIL) 1000 MG CAPS Take 1 capsule by mouth every morning.    Vladimir Faster Glycol-Propyl Glycol 0.4-0.3 % SOLN Place 1 drop into both eyes as needed (DRY EYES).     . Probiotic Product (PROBIOTIC DAILY PO) Take 2 tablets by mouth as needed (DIGESTION).     Marland Kitchen rivaroxaban (XARELTO) 20 MG TABS tablet Take 1 tablet (20 mg total) by  mouth daily with supper. 30 tablet 5  . sodium chloride (OCEAN) 0.65 % SOLN nasal spray Place 1 spray into both nostrils as needed for congestion (SIMPLY SALINE).      No current facility-administered medications for this visit.    Allergies:   Codeine; Hydrocodone; Metoprolol; Rythmol; and Sulfa antibiotics    Social History:  The patient  reports that she has never smoked. She has never used smokeless tobacco. She reports that she does not drink alcohol or use illicit drugs.   Family History:  The patient's family history includes Diabetes in her sister; Hypertension in her mother and another family member; Stroke in her mother. There is no  history of Heart attack.    ROS:  Please see the history of present illness.   Otherwise, review of systems are positive for none.   All other systems are reviewed and negative.    PHYSICAL EXAM: VS:  BP 134/80 mmHg  Pulse 90  Ht 5\' 7"  (1.702 m)  Wt 172 lb 9.6 oz (78.291 kg)  BMI 27.03 kg/m2 , BMI Body mass index is 27.03 kg/(m^2). GEN: Well nourished, well developed, in no acute distress HEENT: normal Neck: no JVD, carotid bruits, or masses Cardiac: Irregularly irregular; no murmurs, rubs, or gallops,no edema  Respiratory:  clear to auscultation bilaterally, normal work of breathing GI: soft, nontender, nondistended, + BS MS: no deformity or atrophy Skin: warm and dry, no rash Neuro:  Strength and sensation are intact Psych: euthymic mood, full affect   EKG:  EKG is not ordered today.    Recent Labs: 05/24/2014: ALT 11; BUN 14; Creatinine, Ser 0.96; Hemoglobin 11.2*; Platelets 170; Potassium 3.8; Sodium 137; TSH 1.750    Lipid Panel    Component Value Date/Time   CHOL 189 05/24/2014 0534   TRIG 147 05/24/2014 0534   HDL 47 05/24/2014 0534   CHOLHDL 4.0 05/24/2014 0534   VLDL 29 05/24/2014 0534   LDLCALC 113* 05/24/2014 0534   LDLDIRECT 137.0 01/06/2012 1211      Wt Readings from Last 3 Encounters:  03/19/15 172 lb 9.6 oz (78.291 kg)  12/12/14 169 lb 3.2 oz (76.749 kg)  09/05/14 169 lb (76.658 kg)        ASSESSMENT AND PLAN:  1. Permanent atrial fibrillation 2. essential hypertension without congestive heart failure 3. Hypercholesterolemia 4. Hypothyroidism 5. Recent stroke felt to be embolic occurring at a time that her INR was 1.9. She is now on Xarelto instead of warfarin.  Plan: Continue same medication. Recheck in 3 months for office visit.  I will plan to see her one more time before my retirement.  Get EKG at next Office visit    Current medicines are reviewed at length with the patient today.  The patient does not have concerns regarding  medicines.  The following changes have been made:  no change  Labs/ tests ordered today include:  No orders of the defined types were placed in this encounter.       Berna Spare MD 03/19/2015 12:39 PM    [facility] [address] [place], [place], [place]  [address] Phone: [phone]; Fax: [phone]

## 2015-03-26 ENCOUNTER — Telehealth: Payer: Self-pay | Admitting: Cardiology

## 2015-03-26 NOTE — Telephone Encounter (Signed)
Spoke with patient and discussed Xarelto samples.  [place] for patient to try back later

## 2015-03-26 NOTE — Telephone Encounter (Signed)
Called pt to inform her that we did not have any samples of Xarelto 20 mg and that she could call back later in the week to see if we have gotten any in. I also offered the medicare low-income subsidy program. Pt did not want it. Pt insisted on speaking with Dr. Sherryl Barters nurse Rip Harbour, RN. Please advise

## 2015-03-26 NOTE — Telephone Encounter (Signed)
Patient calling the office for samples of medication:   1.  What medication and dosage are you requesting samples for? Xarelto  2.  Are you currently out of this medication? Pt states she will be out soon

## 2015-03-29 ENCOUNTER — Telehealth: Payer: Self-pay

## 2015-03-29 NOTE — Telephone Encounter (Signed)
Left message for patient that there are (4) samples of Xarelto 20 up front.

## 2015-04-08 IMAGING — CT CT MAXILLOFACIAL W/O CM
3 series · 16 of 40 positions shown, 19 images · non-contrast
Comparison: 07/16/2010

CLINICAL DATA: Chronic sinusitis

EXAM:
CT MAXILLOFACIAL WITHOUT CONTRAST
TECHNIQUE: Multidetector CT imaging of the maxillofacial structures was
performed. Multiplanar CT image reconstructions were also generated.
A small metallic BB was placed on the right temple in order to
reliably differentiate right from left.

[Series 3: axial soft 1.25 · axial · 0.49mm/px · z∈[-50,-30]mm · 2 of 130 slices shown]
[im 9/130  brain]
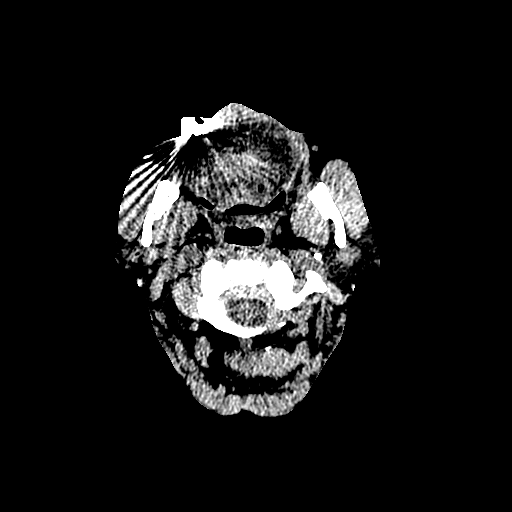
[im 25/130  brain]
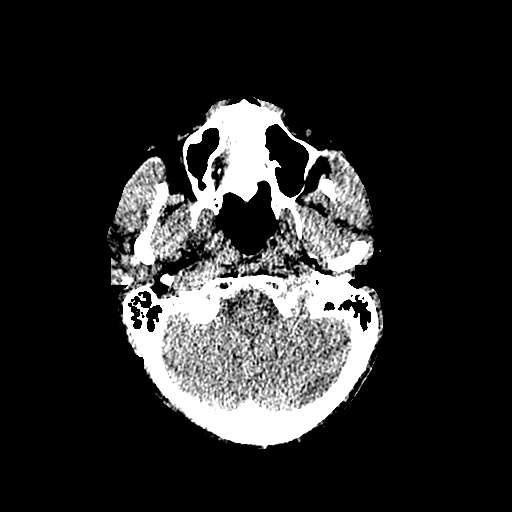

[Series 601: coronal facial · coronal · 0.49mm/px · 3 of 111 slices shown]
[im 37/111  bone]
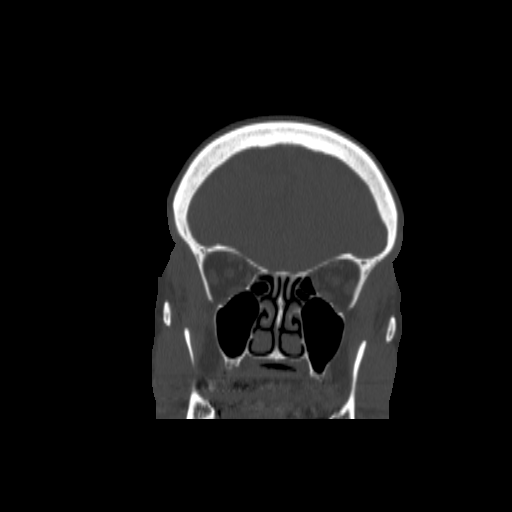
[im 49/111  bone]
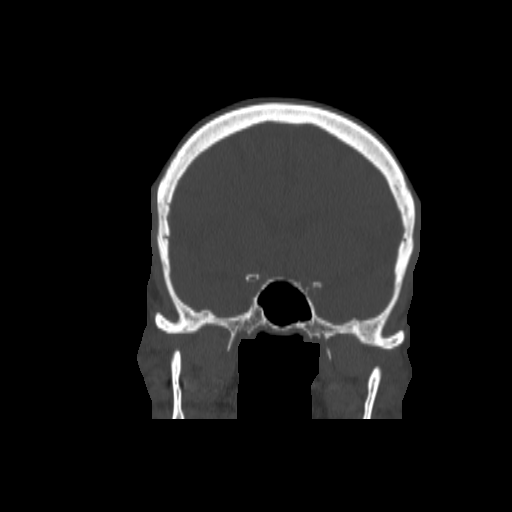
[im 62/111  bone]
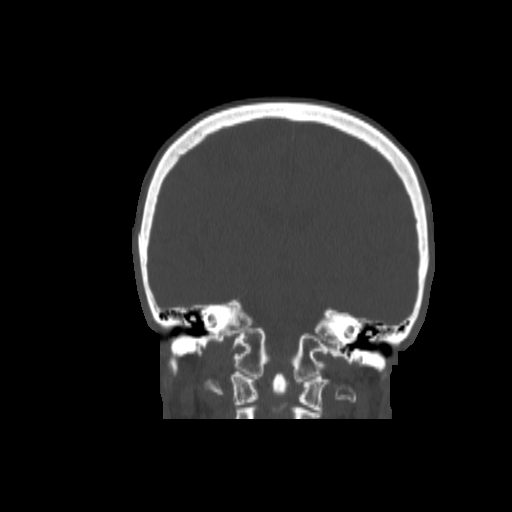

[Series 602: sagittal facial · sagittal · 0.49mm/px · 11 of 103 slices shown, 14 images]
[im 9/103  brain]
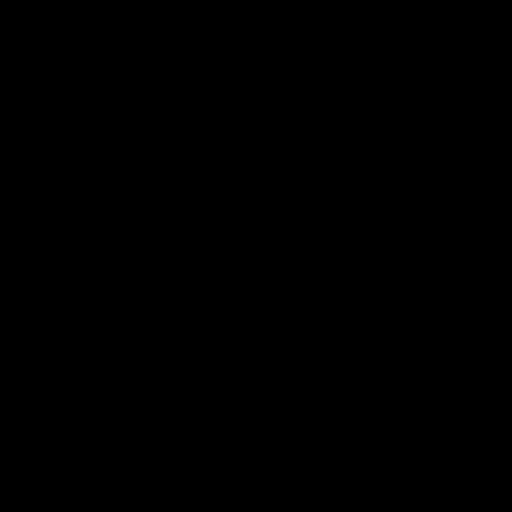
[im 9/103  bone]
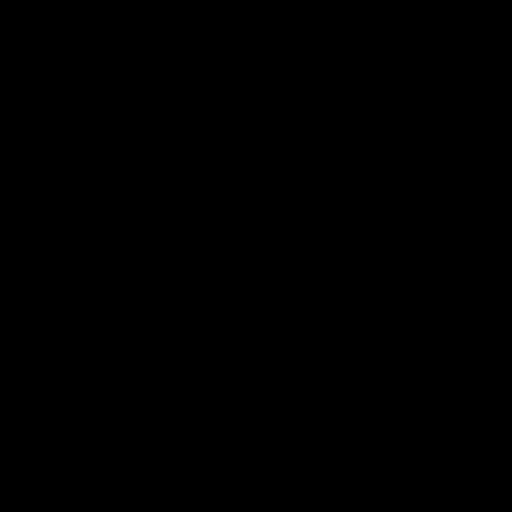
[im 18/103  bone]
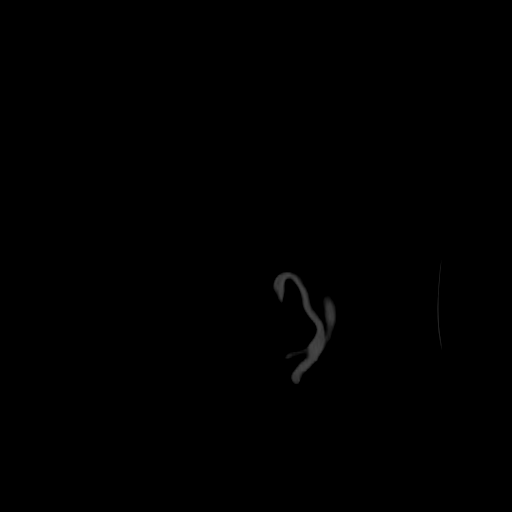
[im 26/103  bone]
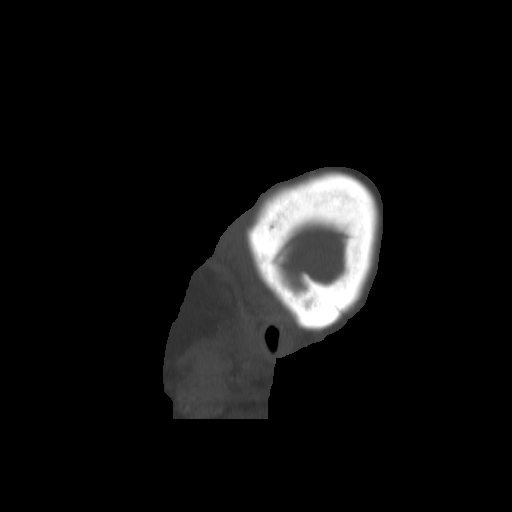
[im 35/103  bone]
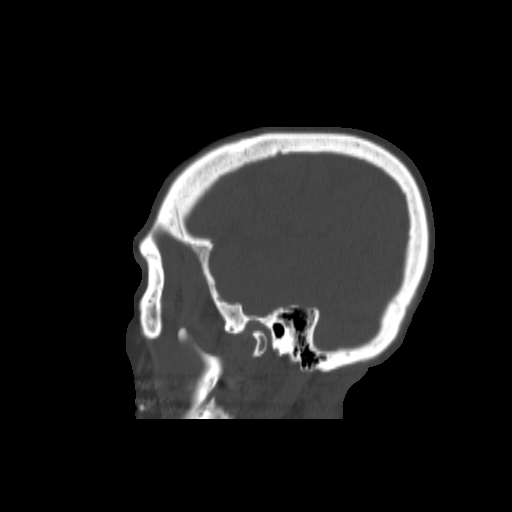
[im 43/103  brain]
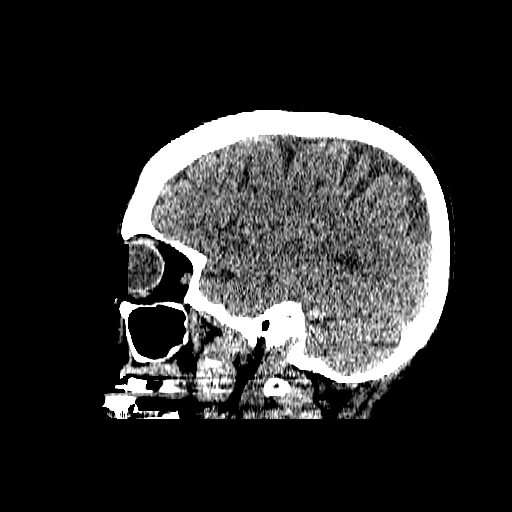
[im 43/103  bone]
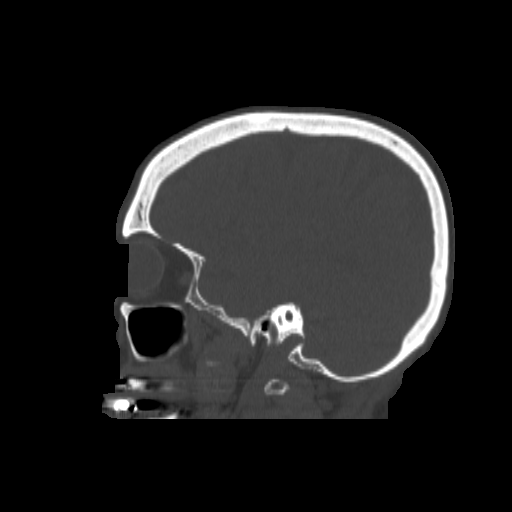
[im 52/103  bone]
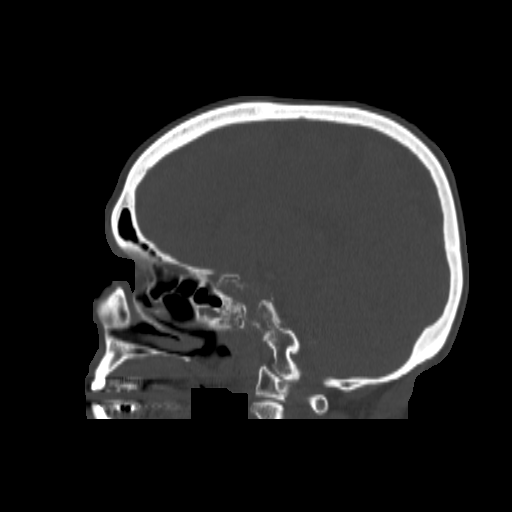
[im 60/103  bone]
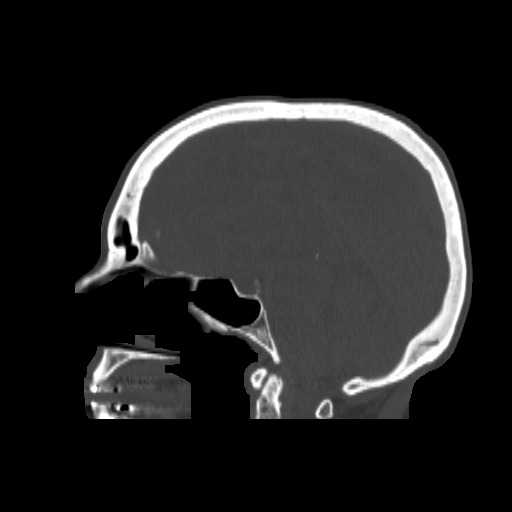
[im 69/103  bone]
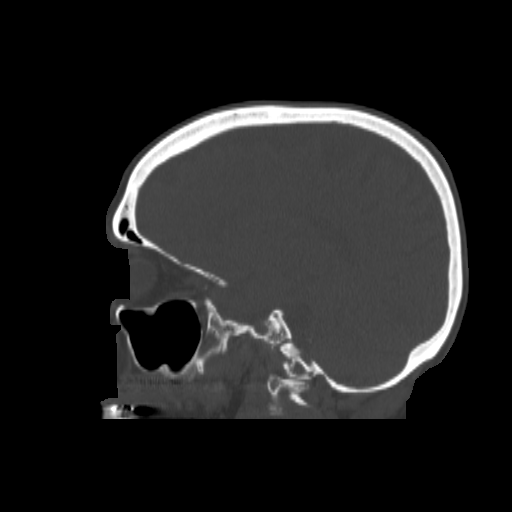
[im 77/103  brain]
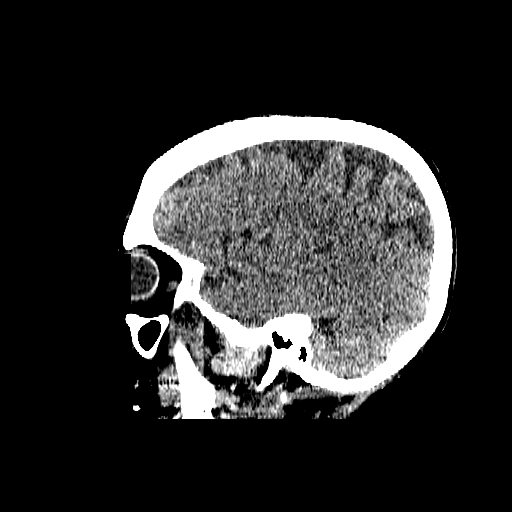
[im 77/103  bone]
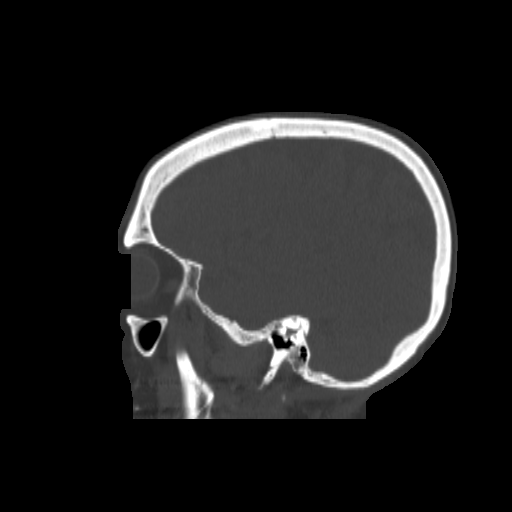
[im 86/103  bone]
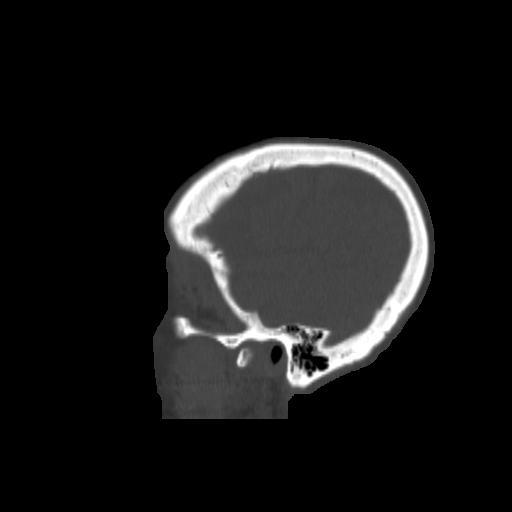
[im 94/103  bone]
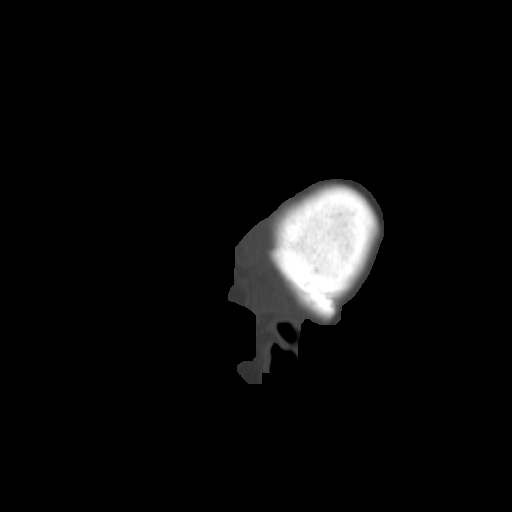

[16 of 40 positions shown; findings below may reference images not displayed]

FINDINGS: Mild mucosal edema in the maxillary sinus bilaterally. Mucosal edema
occludes the right maxillary antrum and causes narrowing of the left
maxillary antrum. Remainder of the paranasal sinuses are clear. No
air-fluid level.

Negative for fracture. No bony destruction. No soft tissue mass.
Visualized intracranial contents are negative.

Posterior bulging of the globe bilaterally compatible with
staphyloma. Bilateral lens replacement.

Mastoid sinus and middle ear are clear bilaterally.
IMPRESSION: Mild mucosal edema in the maxillary sinus bilaterally. There is
apparent occlusion of the right ostium and narrowing of the left
ostium due to mucosal edema.

## 2015-05-10 DIAGNOSIS — H4313 Vitreous hemorrhage, bilateral: Secondary | ICD-10-CM | POA: Diagnosis not present

## 2015-05-10 DIAGNOSIS — H35373 Puckering of macula, bilateral: Secondary | ICD-10-CM | POA: Diagnosis not present

## 2015-05-10 DIAGNOSIS — E119 Type 2 diabetes mellitus without complications: Secondary | ICD-10-CM | POA: Diagnosis not present

## 2015-06-06 ENCOUNTER — Telehealth: Payer: Self-pay | Admitting: Cardiology

## 2015-06-06 NOTE — Telephone Encounter (Signed)
Called pt and explained to the pt that the samples are for pts that are just starting the medication and that we could sent in a refill for the medication and she could apply for the assistance program. Pt stated that she would like for Dr. Sherryl Barters nurse Rip Harbour to give her a call back. Please advise

## 2015-06-06 NOTE — Telephone Encounter (Signed)
New message ° ° ° ° ° °Patient calling the office for samples of medication: ° ° °1.  What medication and dosage are you requesting samples for?  xarelto 20mg ° °2.  Are you currently out of this medication? Almost out of medication ° ° °

## 2015-06-07 NOTE — Telephone Encounter (Signed)
Discussed with patient and she will continue current medications as recommended

## 2015-06-18 ENCOUNTER — Ambulatory Visit (INDEPENDENT_AMBULATORY_CARE_PROVIDER_SITE_OTHER): Payer: Medicare Other | Admitting: Cardiology

## 2015-06-18 ENCOUNTER — Encounter: Payer: Self-pay | Admitting: Cardiology

## 2015-06-18 VITALS — BP 150/60 | HR 74 | Ht 63.0 in | Wt 170.8 lb

## 2015-06-18 DIAGNOSIS — I482 Chronic atrial fibrillation, unspecified: Secondary | ICD-10-CM

## 2015-06-18 DIAGNOSIS — I119 Hypertensive heart disease without heart failure: Secondary | ICD-10-CM | POA: Diagnosis not present

## 2015-06-18 NOTE — Progress Notes (Signed)
Cardiology Office Note   Date:  06/18/2015   ID:  Amanda [name], DOB 1918-05-20, MRN [id number]  PCP:  Mayra Neer, MD  Cardiologist: Darlin Coco MD  Chief Complaint  Patient presents with  . routine follow up    C/O dizziness. Denies chest pain, shortness of breath, le edema, or claudication      History of Present Illness: Amanda [name] is a 82 y.o. female who presents for a scheduled follow-up visit.  This pleasant 82 year old lady is seen for a scheduled followup office visit. She has a history of established atrial fibrillation and is on long-term anticoagulation.. She does not have any history of ischemic heart disease. She had a normal nuclear stress test in 2006. She had an echocardiogram in 2009 which showed mild left atrial enlargement. The patient has a history of essential hypertension and diabetes and hypothyroidism. Since last visit she has been feeling well. She continues to live by herself in her own home. She still drives herself to her appointments and to the grocery store.  The patient had a recent hospital admission for 2 strokes which left her with initial difficulty with speaking.Her speech has subsequently returned to normal. The patient is now on Xarelto rather than warfarin. She denies any new cardiac symptoms. No chest pain. No shortness of breath. No dizziness or syncope. No awareness of rapid palpitations. She still drives and lives by herself.  She has occasional episodes of dizziness.  No syncope.  She has not been having any chest pain or increased shortness of breath.  She has not had any recurrent episodes of stroke or TIA  Past Medical History  Diagnosis Date  . Chronic atrial fibrillation ([place])   . Chronic anticoagulation   . Hypothyroidism   . Sinus congestion   . Carpal tunnel syndrome   . Diabetes mellitus     NON-INSULIN    Past Surgical History  Procedure Laterality Date  . Cholecystectomy    .  Appendectomy    . US echocardiography  12/06/2007    EF 55-60%  . Cardiovascular stress test  07/10/2004    EF 81%     Current Outpatient Prescriptions  Medication Sig Dispense Refill  . acetaminophen (TYLENOL) 500 MG tablet Take 1,000 mg by mouth every 6 (six) hours as needed for mild pain.     . fluticasone (FLONASE) 50 MCG/ACT nasal spray Place 2 sprays into the nose daily.     Marland Kitchen glimepiride (AMARYL) 1 MG tablet 1.5 mg daily with breakfast.     . levothyroxine (SYNTHROID, LEVOTHROID) 88 MCG tablet Take 88 mcg by mouth daily.      Marland Kitchen lisinopril (PRINIVIL,ZESTRIL) 10 MG tablet Take 10 mg by mouth daily.      Marland Kitchen loratadine (CLARITIN) 10 MG tablet Take 10 mg by mouth daily as needed for allergies.     . Multiple Vitamins-Minerals (PRESERVISION AREDS 2 PO) Take 1 capsule by mouth 2 (two) times daily.     . Omega-3 Fatty Acids (FISH OIL) 1000 MG CAPS Take 1 capsule by mouth every morning.    Vladimir Faster Glycol-Propyl Glycol 0.4-0.3 % SOLN Place 1 drop into both eyes as needed (DRY EYES).     . Probiotic Product (PROBIOTIC DAILY PO) Take 2 tablets by mouth as needed (DIGESTION).     Marland Kitchen rivaroxaban (XARELTO) 20 MG TABS tablet Take 1 tablet (20 mg total) by mouth daily with supper. 30 tablet 5  . sodium chloride (OCEAN) 0.65 % SOLN nasal  spray Place 1 spray into both nostrils as needed for congestion (SIMPLY SALINE).      No current facility-administered medications for this visit.    Allergies:   Codeine; Hydrocodone; Metoprolol; Rythmol; and Sulfa antibiotics    Social History:  The patient  reports that she has never smoked. She has never used smokeless tobacco. She reports that she does not drink alcohol or use illicit drugs.   Family History:  The patient's family history includes Diabetes in her sister; Hypertension in her mother; Stroke in her mother. There is no history of Heart attack.    ROS:  Please see the history of present illness.   Otherwise, review of systems are positive for  none.   All other systems are reviewed and negative.    PHYSICAL EXAM: VS:  BP 150/60 mmHg  Pulse 74  Ht 5\' 3"  (1.6 m)  Wt 170 lb 12.8 oz (77.474 kg)  BMI 30.26 kg/m2 , BMI Body mass index is 30.26 kg/(m^2). GEN: Well nourished, well developed, in no acute distress HEENT: normal Neck: no JVD, carotid bruits, or masses Cardiac: Irregularly irregular rhythm.  There is a midsystolic click.No, rubs, or gallops,no edema  Respiratory:  clear to auscultation bilaterally, normal work of breathing GI: soft, nontender, nondistended, + BS MS: no deformity or atrophy Skin: warm and dry, no rash Neuro:  Strength and sensation are intact Psych: euthymic mood, full affect   EKG:  EKG is ordered today. The ekg ordered today demonstrates atrial fibrillation with controlled ventricular response at 74 bpm   Recent Labs: No results found for requested labs within last 365 days.    Lipid Panel    Component Value Date/Time   CHOL 189 05/24/2014 0534   TRIG 147 05/24/2014 0534   HDL 47 05/24/2014 0534   CHOLHDL 4.0 05/24/2014 0534   VLDL 29 05/24/2014 0534   LDLCALC 113* 05/24/2014 0534   LDLDIRECT 137.0 01/06/2012 1211      Wt Readings from Last 3 Encounters:  06/18/15 170 lb 12.8 oz (77.474 kg)  03/19/15 172 lb 9.6 oz (78.291 kg)  12/12/14 169 lb 3.2 oz (76.749 kg)        ASSESSMENT AND PLAN:  1. Permanent atrial fibrillation 2. essential hypertension without congestive heart failure 3. Hypercholesterolemia 4. Hypothyroidism 5. Recent stroke felt to be embolic occurring at a time that her INR was 1.9. She is now on Xarelto instead of warfarin.  Plan: Continue same medication.Recheck in 4 months for office visit and EKG with Dr. Acie Fredrickson, who sees some other family members   Current medicines are reviewed at length with the patient today.  The patient does not have concerns regarding medicines.  The following changes have been made:  no change  Labs/ tests ordered today  include:   Orders Placed This Encounter  Procedures  . EKG 12-Lead     Disposition: No change in meds  Signed, Darlin Coco MD 06/18/2015 1:56 PM    [facility] [place], [place], [place]  [address] Phone: [phone]; Fax: [phone]

## 2015-06-18 NOTE — Patient Instructions (Signed)
Medication Instructions:  Your physician recommends that you continue on your current medications as directed. Please refer to the Current Medication list given to you today.  Labwork: NONE  Testing/Procedures: NONE  Follow-Up: Your physician recommends that you schedule a follow-up appointment in: [place]  If you need a refill on your cardiac medications before your next appointment, please call your pharmacy.

## 2015-07-02 ENCOUNTER — Telehealth: Payer: Self-pay | Admitting: Cardiology

## 2015-07-02 NOTE — Telephone Encounter (Signed)
° ° °

## 2015-07-04 NOTE — Telephone Encounter (Signed)
Samples placed at the front desk for patient. Tried to call her, but did not reach her.

## 2015-08-13 ENCOUNTER — Telehealth: Payer: Self-pay | Admitting: Cardiovascular Disease

## 2015-08-13 NOTE — Telephone Encounter (Signed)
[name], CMA at 08/13/2015 11:45 AM     Status: Signed       Expand All Collapse All   informed pt to have [facility] call and [facility] due to [facility] not having Tel[facility]artan. pt said he would call and do that.       This was an error, not sure how this got on this pt's file, wrong person.

## 2015-08-13 NOTE — Telephone Encounter (Signed)
informed pt to have [facility] call and [facility] due to [facility] not having Tel[facility]artan. pt said he would call and do that. 

## 2015-08-13 NOTE — Telephone Encounter (Signed)
New Message  Patient calling the office for samples of medication:   1.  What medication and dosage are you requesting samples for? xarelto- 20 mg   2.  Are you currently out of this medication? "Just a few"

## 2015-08-13 NOTE — Telephone Encounter (Signed)
Routing History     Priority Sent On From To Message Type     08/13/2015 11:45 AM [name], CMA Amanda [name] Patient Calls     Comment: Please call pt, she keeps requesting samples, we do not have them here, would you check to see if you have them and call her to come get them from you at [facility] if you do, thanks      08/13/2015 11:02 AM [place] Refill Patient Calls    Sent to [place]

## 2015-08-14 ENCOUNTER — Telehealth: Payer: Self-pay | Admitting: Cardiovascular Disease

## 2015-08-14 NOTE — Telephone Encounter (Signed)
Patient called and stated that the xarelto needs a prior authorization. She has five pills left. She inquired about samples and I made her aware that I could place a few at the front desk for her and also that these are really intended for patients that are just starting on the medication. She was not very happy about this and she implied that she would really like a month supply of samples. She stated that since she has part d insurance coverage, she is unable to get approved for patient assistance and stated that she will go into the coverage gap quickly with the xarelto. She does not wish to go back on coumadin as she stated that medication is what caused her stoke last year. The number that she provided to call for the PA is [phone].

## 2015-08-14 NOTE — Telephone Encounter (Signed)
Patient calling the office for samples of medication:   1.  What medication and dosage are you requesting samples for? Xarelto 20 mg  2.  Are you currently out of this medication? Yes  :  

## 2015-08-15 NOTE — Telephone Encounter (Signed)
Prior auth submitted to [facility] for Xarelto 20mg .

## 2015-08-24 ENCOUNTER — Telehealth: Payer: Self-pay | Admitting: Cardiovascular Disease

## 2015-08-24 ENCOUNTER — Other Ambulatory Visit (INDEPENDENT_AMBULATORY_CARE_PROVIDER_SITE_OTHER): Payer: Medicare Other | Admitting: *Deleted

## 2015-08-24 DIAGNOSIS — Z7901 Long term (current) use of anticoagulants: Secondary | ICD-10-CM

## 2015-08-24 DIAGNOSIS — I4891 Unspecified atrial fibrillation: Secondary | ICD-10-CM

## 2015-08-24 DIAGNOSIS — I639 Cerebral infarction, unspecified: Secondary | ICD-10-CM | POA: Diagnosis not present

## 2015-08-24 DIAGNOSIS — I6350 Cerebral infarction due to unspecified occlusion or stenosis of unspecified cerebral artery: Secondary | ICD-10-CM | POA: Diagnosis not present

## 2015-08-24 LAB — BASIC METABOLIC PANEL
BUN: 17 mg/dL (ref 7–25)
CO2: 26 mmol/L (ref 20–31)
Calcium: 8.8 mg/dL (ref 8.6–10.4)
Chloride: 103 mmol/L (ref 98–110)
Creat: 0.95 mg/dL — ABNORMAL HIGH (ref 0.60–0.88)
Glucose, Bld: 163 mg/dL — ABNORMAL HIGH (ref 65–99)
Potassium: 4.2 mmol/L (ref 3.5–5.3)
Sodium: 139 mmol/L (ref 135–146)

## 2015-08-24 LAB — CBC WITH DIFFERENTIAL/PLATELET
BASOS PCT: 1 %
Basophils Absolute: 76 cells/uL (ref 0–200)
Eosinophils Absolute: 152 cells/uL (ref 15–500)
Eosinophils Relative: 2 %
HCT: 37.5 % (ref 35.0–45.0)
Hemoglobin: 12.1 g/dL (ref 11.7–15.5)
Lymphocytes Relative: 30 %
Lymphs Abs: 2280 cells/uL (ref 850–3900)
MCH: 31.6 pg (ref 27.0–33.0)
MCHC: 32.3 g/dL (ref 32.0–36.0)
MCV: 97.9 fL (ref 80.0–100.0)
MONOS PCT: 9 %
MPV: 11.3 fL (ref 7.5–12.5)
Monocytes Absolute: 684 cells/uL (ref 200–950)
NEUTROS ABS: 4408 {cells}/uL (ref 1500–7800)
Neutrophils Relative %: 58 %
Platelets: 192 10*3/uL (ref 140–400)
RBC: 3.83 MIL/uL (ref 3.80–5.10)
RDW: 13.7 % (ref 11.0–15.0)
WBC: 7.6 10*3/uL (ref 3.8–10.8)

## 2015-08-24 NOTE — Telephone Encounter (Signed)
New Message:  Pt is calling in stating that this morning at 4 am she had a nose bleed that lasted until around 8 am. She is concerned because she is on Xarelto. She also voiced that she had a dental cleaning on 4/5 and did not hold the medication at that time. She would like to be advised on what to do moving forward.

## 2015-08-24 NOTE — Telephone Encounter (Signed)
Calling stating that she had some dental work done on 4/5, a cleaning and filling, and did not stop her Xarelto.  States this AM she woke up at 4 AM with a nose bleed.  She had some clots at first and then slowed down.  She did pack with some guaze and stopped about 8 AM.  At this time she still has a guaze packed into nose with very minimal drainage.  She states she did have some nasal surgery done about a year ago but has not had any problems since then.  States she does use a nasal spray- saline- as needed.  She is taking Xarelto due to a stroke 1 1/2 yrs ago. She is calling wanting to know if she should take her Xarelto tonight.  Spoke w/Megan Supple, Pharm who suggests that she continue to pack her nose and to continue her Xarelto. Jinny Blossom also suggests that she come in for a BMET and CBC to evaluate her kidney function.  Her creat has been stable but may need to reduce dose of Xarelto to 15 mg.  She states that she will try to get a ride today to come in for lab.  Will forward to Dr. Acie Fredrickson.

## 2015-08-27 DIAGNOSIS — I10 Essential (primary) hypertension: Secondary | ICD-10-CM | POA: Diagnosis not present

## 2015-08-27 DIAGNOSIS — K5790 Diverticulosis of intestine, part unspecified, without perforation or abscess without bleeding: Secondary | ICD-10-CM | POA: Diagnosis not present

## 2015-08-27 DIAGNOSIS — M8580 Other specified disorders of bone density and structure, unspecified site: Secondary | ICD-10-CM | POA: Diagnosis not present

## 2015-08-27 DIAGNOSIS — Z0000 Encounter for general adult medical examination without abnormal findings: Secondary | ICD-10-CM | POA: Diagnosis not present

## 2015-08-27 DIAGNOSIS — Z7984 Long term (current) use of oral hypoglycemic drugs: Secondary | ICD-10-CM | POA: Diagnosis not present

## 2015-08-27 DIAGNOSIS — K219 Gastro-esophageal reflux disease without esophagitis: Secondary | ICD-10-CM | POA: Diagnosis not present

## 2015-08-27 DIAGNOSIS — M150 Primary generalized (osteo)arthritis: Secondary | ICD-10-CM | POA: Diagnosis not present

## 2015-08-27 DIAGNOSIS — I481 Persistent atrial fibrillation: Secondary | ICD-10-CM | POA: Diagnosis not present

## 2015-08-27 DIAGNOSIS — J301 Allergic rhinitis due to pollen: Secondary | ICD-10-CM | POA: Diagnosis not present

## 2015-08-27 DIAGNOSIS — E11319 Type 2 diabetes mellitus with unspecified diabetic retinopathy without macular edema: Secondary | ICD-10-CM | POA: Diagnosis not present

## 2015-08-27 DIAGNOSIS — E782 Mixed hyperlipidemia: Secondary | ICD-10-CM | POA: Diagnosis not present

## 2015-08-27 DIAGNOSIS — E039 Hypothyroidism, unspecified: Secondary | ICD-10-CM | POA: Diagnosis not present

## 2015-08-28 ENCOUNTER — Telehealth: Payer: Self-pay

## 2015-08-28 NOTE — Telephone Encounter (Signed)
Xarelto 20mg  approved through 05/18/2016.

## 2015-08-29 NOTE — Telephone Encounter (Signed)
Notified of Dr. Elmarie Shiley comments.  Spoke w/Megan Supple,Pharm and she states she qualifies for lower dose of Xarelto.  Her Creat Clearance is 44.  Will decrease Xarelto to 15 mg.  Gave her samples. Will put samples at front desk.  She states she just recently had the Xarelto 20 mg refilled.  Therefore she will finish the 20 mg and then start the 15 mg.  She will let us know when she needs new Rx for the Xarelto 15 mg.

## 2015-08-29 NOTE — Telephone Encounter (Signed)
Agree with note by Derek Mound, RN.

## 2015-09-04 ENCOUNTER — Emergency Department (HOSPITAL_COMMUNITY): Payer: Medicare Other

## 2015-09-04 ENCOUNTER — Emergency Department (HOSPITAL_COMMUNITY)
Admission: EM | Admit: 2015-09-04 | Discharge: 2015-09-04 | Disposition: A | Discharge status: Home / Self Care | Payer: Medicare Other | Attending: Emergency Medicine | Admitting: Emergency Medicine

## 2015-09-04 ENCOUNTER — Encounter (HOSPITAL_COMMUNITY): Payer: Self-pay | Admitting: Emergency Medicine

## 2015-09-04 DIAGNOSIS — Z8669 Personal history of other diseases of the nervous system and sense organs: Secondary | ICD-10-CM | POA: Diagnosis not present

## 2015-09-04 DIAGNOSIS — R1013 Epigastric pain: Secondary | ICD-10-CM | POA: Diagnosis not present

## 2015-09-04 DIAGNOSIS — E039 Hypothyroidism, unspecified: Secondary | ICD-10-CM | POA: Diagnosis not present

## 2015-09-04 DIAGNOSIS — E119 Type 2 diabetes mellitus without complications: Secondary | ICD-10-CM | POA: Diagnosis not present

## 2015-09-04 DIAGNOSIS — Z79899 Other long term (current) drug therapy: Secondary | ICD-10-CM | POA: Insufficient documentation

## 2015-09-04 DIAGNOSIS — R531 Weakness: Secondary | ICD-10-CM | POA: Diagnosis not present

## 2015-09-04 DIAGNOSIS — I482 Chronic atrial fibrillation: Secondary | ICD-10-CM | POA: Insufficient documentation

## 2015-09-04 DIAGNOSIS — Z7951 Long term (current) use of inhaled steroids: Secondary | ICD-10-CM | POA: Insufficient documentation

## 2015-09-04 DIAGNOSIS — Z7901 Long term (current) use of anticoagulants: Secondary | ICD-10-CM | POA: Diagnosis not present

## 2015-09-04 DIAGNOSIS — R1111 Vomiting without nausea: Secondary | ICD-10-CM | POA: Diagnosis not present

## 2015-09-04 DIAGNOSIS — R404 Transient alteration of awareness: Secondary | ICD-10-CM | POA: Diagnosis not present

## 2015-09-04 DIAGNOSIS — R42 Dizziness and giddiness: Secondary | ICD-10-CM | POA: Diagnosis not present

## 2015-09-04 DIAGNOSIS — R1110 Vomiting, unspecified: Secondary | ICD-10-CM | POA: Diagnosis present

## 2015-09-04 LAB — CBC WITH DIFFERENTIAL/PLATELET
BASOS PCT: 0 %
Basophils Absolute: 0 10*3/uL (ref 0.0–0.1)
Eosinophils Absolute: 0.1 10*3/uL (ref 0.0–0.7)
Eosinophils Relative: 1 %
HEMATOCRIT: 36.8 % (ref 36.0–46.0)
Hemoglobin: 11.6 g/dL — ABNORMAL LOW (ref 12.0–15.0)
Lymphocytes Relative: 11 %
Lymphs Abs: 1 10*3/uL (ref 0.7–4.0)
MCH: 31 pg (ref 26.0–34.0)
MCHC: 31.5 g/dL (ref 30.0–36.0)
MCV: 98.4 fL (ref 78.0–100.0)
MONO ABS: 0.5 10*3/uL (ref 0.1–1.0)
MONOS PCT: 6 %
Neutro Abs: 7.5 10*3/uL (ref 1.7–7.7)
Neutrophils Relative %: 82 %
PLATELETS: 165 10*3/uL (ref 150–400)
RBC: 3.74 MIL/uL — ABNORMAL LOW (ref 3.87–5.11)
RDW: 13.8 % (ref 11.5–15.5)
WBC: 9.1 10*3/uL (ref 4.0–10.5)

## 2015-09-04 LAB — URINALYSIS, ROUTINE W REFLEX MICROSCOPIC
Bilirubin Urine: NEGATIVE
Glucose, UA: 100 mg/dL — AB
HGB URINE DIPSTICK: NEGATIVE
Ketones, ur: 15 mg/dL — AB
Leukocytes, UA: NEGATIVE
Nitrite: NEGATIVE
PH: 7 (ref 5.0–8.0)
PROTEIN: NEGATIVE mg/dL
SPECIFIC GRAVITY, URINE: 1.016 (ref 1.005–1.030)

## 2015-09-04 LAB — I-STAT TROPONIN, ED: TROPONIN I, POC: 0 ng/mL (ref 0.00–0.08)

## 2015-09-04 LAB — COMPREHENSIVE METABOLIC PANEL
ALBUMIN: 3.4 g/dL — AB (ref 3.5–5.0)
ALK PHOS: 69 U/L (ref 38–126)
ALT: 13 U/L — ABNORMAL LOW (ref 14–54)
AST: 17 U/L (ref 15–41)
Anion gap: 9 (ref 5–15)
BILIRUBIN TOTAL: 0.7 mg/dL (ref 0.3–1.2)
BUN: 12 mg/dL (ref 6–20)
CALCIUM: 8.7 mg/dL — AB (ref 8.9–10.3)
CO2: 26 mmol/L (ref 22–32)
Chloride: 103 mmol/L (ref 101–111)
Creatinine, Ser: 0.85 mg/dL (ref 0.44–1.00)
GFR calc Af Amer: >60 mL/min (ref >60)
GFR calc non Af Amer: 57 mL/min — ABNORMAL LOW (ref >60)
GLUCOSE: 223 mg/dL — AB (ref 65–99)
Potassium: 4.4 mmol/L (ref 3.5–5.1)
SODIUM: 138 mmol/L (ref 135–145)
Total Protein: 6.4 g/dL — ABNORMAL LOW (ref 6.5–8.1)

## 2015-09-04 LAB — LIPASE, BLOOD: Lipase: 29 U/L (ref 11–51)

## 2015-09-04 MED ORDER — ONDANSETRON 4 MG PO TBDP: ORAL_TABLET | ORAL | Status: DC

## 2015-09-04 MED ORDER — MECLIZINE HCL 25 MG PO TABS: ORAL_TABLET | ORAL | Status: DC

## 2015-09-04 MED ORDER — MECLIZINE HCL 25 MG PO TABS
25.0000 mg | ORAL_TABLET | Freq: Once | ORAL | Status: AC
  Administered 2015-09-04: 25 mg via ORAL
  Filled 2015-09-04: qty 1

## 2015-09-04 MED ORDER — ONDANSETRON HCL 4 MG/2ML IJ SOLN
4.0000 mg | Freq: Once | INTRAMUSCULAR | Status: AC
  Administered 2015-09-04: 4 mg via INTRAVENOUS
  Filled 2015-09-04: qty 2

## 2015-09-04 NOTE — Discharge Instructions (Signed)
Rest at home. Follow-up with either your family doctor for your ENT doctor later this week. Return if persistent vomiting

## 2015-09-04 NOTE — ED Notes (Addendum)
PT woke up this morning at 0730, took her synthroid and shortly after became very nauseated. PT started to vomit, and told her daughter her chest felt heavy. PT estimates she vomited approximately four times. PT reports pressure in epigastric area and nausea. 4 mg of Zofran IV in route.

## 2015-09-04 NOTE — ED Provider Notes (Signed)
CSN: [id number]     Arrival date & time 09/04/15  1035 History   First MD Initiated Contact with Patient 09/04/15 1041     Chief Complaint  Patient presents with  . Emesis     (Consider location/radiation/quality/duration/timing/severity/associated sxs/prior Treatment) Patient is a 82 y.o. female presenting with vomiting. The history is provided by the patient (The patient states that she got up and felt very nauseated and then vomited 4 times).  Emesis Severity:  Moderate Timing:  Intermittent Quality:  Stomach contents Able to tolerate:  Liquids Progression:  Unchanged Chronicity:  New Recent urination:  Normal Context: not post-tussive   Relieved by:  Nothing Associated symptoms: no abdominal pain, no diarrhea and no headaches     Past Medical History  Diagnosis Date  . Chronic atrial fibrillation ([place])   . Chronic anticoagulation   . Hypothyroidism   . Sinus congestion   . Carpal tunnel syndrome   . Diabetes mellitus     NON-INSULIN   Past Surgical History  Procedure Laterality Date  . Cholecystectomy    . Appendectomy    . US echocardiography  12/06/2007    EF 55-60%  . Cardiovascular stress test  07/10/2004    EF 81%   Family History  Problem Relation Age of Onset  . Stroke Mother   . Diabetes Sister   . Heart attack Neg Hx   . Hypertension    . Hypertension Mother    Social History  Substance Use Topics  . Smoking status: Never Smoker   . Smokeless tobacco: Never Used  . Alcohol Use: No   OB History    No data available     Review of Systems  Constitutional: Negative for appetite change and fatigue.  HENT: Negative for congestion, ear discharge and sinus pressure.   Eyes: Negative for discharge.  Respiratory: Negative for cough.   Cardiovascular: Negative for chest pain.  Gastrointestinal: Positive for vomiting. Negative for abdominal pain and diarrhea.  Genitourinary: Negative for frequency and hematuria.  Musculoskeletal: Negative for back  pain.  Skin: Negative for rash.  Neurological: Negative for seizures and headaches.  Psychiatric/Behavioral: Negative for hallucinations.      Allergies  Other; Codeine; Hydrocodone; Metoprolol; Rythmol; and Sulfa antibiotics  Home Medications   Prior to Admission medications   Medication Sig Start Date End Date Taking? Authorizing Provider  fluticasone (FLONASE) 50 MCG/ACT nasal spray Place 2 sprays into the nose daily.    Yes Historical Provider, MD  glimepiride (AMARYL) 1 MG tablet 1.5 mg daily with breakfast.    Yes Historical Provider, MD  levothyroxine (SYNTHROID, LEVOTHROID) 88 MCG tablet Take 88 mcg by mouth daily.     Yes Historical Provider, MD  lisinopril (PRINIVIL,ZESTRIL) 10 MG tablet Take 10 mg by mouth daily.     Yes Historical Provider, MD  loratadine (CLARITIN) 10 MG tablet Take 10 mg by mouth daily as needed for allergies.    Yes Historical Provider, MD  Multiple Vitamins-Minerals (PRESERVISION AREDS 2 PO) Take 1 capsule by mouth 2 (two) times daily.    Yes Historical Provider, MD  Omega-3 Fatty Acids (FISH OIL) 1000 MG CAPS Take 1 capsule by mouth every morning.   Yes Historical Provider, MD  Probiotic Product (PROBIOTIC DAILY PO) Take 2 tablets by mouth as needed (DIGESTION).    Yes Historical Provider, MD  rivaroxaban (XARELTO) 20 MG TABS tablet Take 20 mg by mouth daily with supper.   Yes Historical Provider, MD  acetaminophen (TYLENOL) 500 MG tablet  Take 1,000 mg by mouth every 6 (six) hours as needed for mild pain.     Historical Provider, MD  meclizine (ANTIVERT) 25 MG tablet Take one every 6-8 hours as needed for dizziness 09/04/15   Milton Ferguson, MD  ondansetron (ZOFRAN ODT) 4 MG disintegrating tablet 4mg  ODT q4 hours prn nausea/vomit 09/04/15   Milton Ferguson, MD  Polyethyl Glycol-Propyl Glycol 0.4-0.3 % SOLN Place 1 drop into both eyes as needed (DRY EYES).     Historical Provider, MD  Rivaroxaban (XARELTO) 15 MG TABS tablet Take 15 mg (one tablet) with largest  meal 08/29/15   Thayer Headings, MD  sodium chloride (OCEAN) 0.65 % SOLN nasal spray Place 1 spray into both nostrils as needed for congestion (SIMPLY SALINE).     Historical Provider, MD   BP 143/79 mmHg  Pulse 53  Temp(Src) 98.1 F (36.7 C) (Oral)  Resp 16  Ht 5\' 2"  (1.575 m)  Wt 168 lb (76.204 kg)  BMI 30.72 kg/m2  SpO2 97% Physical Exam  Constitutional: She is oriented to person, place, and time. She appears well-developed.  HENT:  Head: Normocephalic.  Eyes: Conjunctivae and EOM are normal. No scleral icterus.  Neck: Neck supple. No thyromegaly present.  Cardiovascular: Normal rate and regular rhythm.  Exam reveals no gallop and no friction rub.   No murmur heard. Pulmonary/Chest: No stridor. She has no wheezes. She has no rales. She exhibits no tenderness.  Abdominal: She exhibits no distension. There is no tenderness. There is no rebound.  Musculoskeletal: Normal range of motion. She exhibits no edema.  Lymphadenopathy:    She has no cervical adenopathy.  Neurological: She is oriented to person, place, and time. She exhibits normal muscle tone. Coordination normal.  Skin: No rash noted. No erythema.  Psychiatric: She has a normal mood and affect. Her behavior is normal.    ED Course  Procedures (including critical care time) Labs Review Labs Reviewed  COMPREHENSIVE METABOLIC PANEL - Abnormal; Notable for the following:    Glucose, Bld 223 (*)    Calcium 8.7 (*)    Total Protein 6.4 (*)    Albumin 3.4 (*)    ALT 13 (*)    GFR calc non Af Amer 57 (*)    All other components within normal limits  CBC WITH DIFFERENTIAL/PLATELET - Abnormal; Notable for the following:    RBC 3.74 (*)    Hemoglobin 11.6 (*)    All other components within normal limits  URINALYSIS, ROUTINE W REFLEX MICROSCOPIC (NOT AT [facility]) - Abnormal; Notable for the following:    Glucose, UA 100 (*)    Ketones, ur 15 (*)    All other components within normal limits  LIPASE, BLOOD  I-STAT TROPOININ,  ED    Imaging Review Dg Abd Acute W/chest  09/04/2015  CLINICAL DATA:  Chest heaviness and epigastric pressure beginning today. Weakness. Initial encounter. EXAM: DG ABDOMEN ACUTE W/ 1V CHEST COMPARISON:  Single view of the chest 05/24/2014 and PA and lateral chest 05/17/2004. FINDINGS: Single view of the chest demonstrates cardiomegaly without edema. The lungs are clear. No pneumothorax or pleural effusion. Two views of the abdomen show no free intraperitoneal air. The bowel gas pattern is normal. No unexpected abdominal calcification or focal bony abnormality is identified. IMPRESSION: Negative exam. Electronically Signed   By: Inge Rise M.D.   On: 09/04/2015 12:55   I have personally reviewed and evaluated these images and lab results as part of my medical decision-making.  EKG Interpretation   Date/Time:  Tuesday September 04 2015 10:47:33 EDT Ventricular Rate:  65 PR Interval:    QRS Duration: 110 QT Interval:  410 QTC Calculation: 426 R Axis:   48 Text Interpretation:  Atrial fibrillation Low voltage, extremity leads  Anteroseptal infarct, old Confirmed by Ocie Tino  MD, Aijalon Demuro [phone]) on  09/04/2015 11:03:35 AM      MDM   Final diagnoses:  Vertigo    Labs unremarkable except for mildly elevated glucose. Patient's symptoms seem to be getting worse with movement of her head. She improved with Zofran and Antivert. Suspect inner ear problem. Patient will be sent home with Zofran and Antivert   Milton Ferguson, MD 09/04/15 [phone]

## 2015-09-10 DIAGNOSIS — H903 Sensorineural hearing loss, bilateral: Secondary | ICD-10-CM | POA: Diagnosis not present

## 2015-09-10 DIAGNOSIS — J310 Chronic rhinitis: Secondary | ICD-10-CM | POA: Diagnosis not present

## 2015-09-10 DIAGNOSIS — H6121 Impacted cerumen, right ear: Secondary | ICD-10-CM | POA: Diagnosis not present

## 2015-09-10 DIAGNOSIS — R42 Dizziness and giddiness: Secondary | ICD-10-CM | POA: Diagnosis not present

## 2015-09-24 DIAGNOSIS — F411 Generalized anxiety disorder: Secondary | ICD-10-CM | POA: Diagnosis not present

## 2015-09-24 DIAGNOSIS — K589 Irritable bowel syndrome without diarrhea: Secondary | ICD-10-CM | POA: Diagnosis not present

## 2015-09-24 DIAGNOSIS — R42 Dizziness and giddiness: Secondary | ICD-10-CM | POA: Diagnosis not present

## 2015-09-28 ENCOUNTER — Inpatient Hospital Stay (HOSPITAL_COMMUNITY)
Admission: EM | Admit: 2015-09-28 | Discharge: 2015-09-30 | DRG: 149 | Disposition: A | Discharge status: Home / Self Care | Payer: Medicare Other | Attending: Internal Medicine | Admitting: Internal Medicine

## 2015-09-28 ENCOUNTER — Emergency Department (HOSPITAL_COMMUNITY): Payer: Medicare Other

## 2015-09-28 ENCOUNTER — Encounter (HOSPITAL_COMMUNITY): Payer: Self-pay | Admitting: *Deleted

## 2015-09-28 DIAGNOSIS — Z9049 Acquired absence of other specified parts of digestive tract: Secondary | ICD-10-CM

## 2015-09-28 DIAGNOSIS — Z8673 Personal history of transient ischemic attack (TIA), and cerebral infarction without residual deficits: Secondary | ICD-10-CM

## 2015-09-28 DIAGNOSIS — R42 Dizziness and giddiness: Secondary | ICD-10-CM

## 2015-09-28 DIAGNOSIS — Z79899 Other long term (current) drug therapy: Secondary | ICD-10-CM

## 2015-09-28 DIAGNOSIS — I119 Hypertensive heart disease without heart failure: Secondary | ICD-10-CM | POA: Diagnosis present

## 2015-09-28 DIAGNOSIS — H8141 Vertigo of central origin, right ear: Principal | ICD-10-CM | POA: Diagnosis present

## 2015-09-28 DIAGNOSIS — D649 Anemia, unspecified: Secondary | ICD-10-CM | POA: Diagnosis not present

## 2015-09-28 DIAGNOSIS — I482 Chronic atrial fibrillation: Secondary | ICD-10-CM | POA: Diagnosis present

## 2015-09-28 DIAGNOSIS — Z833 Family history of diabetes mellitus: Secondary | ICD-10-CM

## 2015-09-28 DIAGNOSIS — Z823 Family history of stroke: Secondary | ICD-10-CM

## 2015-09-28 DIAGNOSIS — E038 Other specified hypothyroidism: Secondary | ICD-10-CM

## 2015-09-28 DIAGNOSIS — R404 Transient alteration of awareness: Secondary | ICD-10-CM | POA: Diagnosis not present

## 2015-09-28 DIAGNOSIS — Z7901 Long term (current) use of anticoagulants: Secondary | ICD-10-CM

## 2015-09-28 DIAGNOSIS — E039 Hypothyroidism, unspecified: Secondary | ICD-10-CM | POA: Diagnosis not present

## 2015-09-28 DIAGNOSIS — R531 Weakness: Secondary | ICD-10-CM | POA: Diagnosis not present

## 2015-09-28 DIAGNOSIS — Z9889 Other specified postprocedural states: Secondary | ICD-10-CM

## 2015-09-28 DIAGNOSIS — I4821 Permanent atrial fibrillation: Secondary | ICD-10-CM | POA: Diagnosis present

## 2015-09-28 DIAGNOSIS — Z888 Allergy status to other drugs, medicaments and biological substances status: Secondary | ICD-10-CM

## 2015-09-28 DIAGNOSIS — Z8249 Family history of ischemic heart disease and other diseases of the circulatory system: Secondary | ICD-10-CM

## 2015-09-28 DIAGNOSIS — Z882 Allergy status to sulfonamides status: Secondary | ICD-10-CM

## 2015-09-28 DIAGNOSIS — E119 Type 2 diabetes mellitus without complications: Secondary | ICD-10-CM | POA: Diagnosis present

## 2015-09-28 DIAGNOSIS — E1121 Type 2 diabetes mellitus with diabetic nephropathy: Secondary | ICD-10-CM

## 2015-09-28 DIAGNOSIS — IMO0001 Reserved for inherently not codable concepts without codable children: Secondary | ICD-10-CM | POA: Diagnosis present

## 2015-09-28 LAB — CBC WITH DIFFERENTIAL/PLATELET
BASOS PCT: 0 %
Basophils Absolute: 0 10*3/uL (ref 0.0–0.1)
EOS ABS: 0.1 10*3/uL (ref 0.0–0.7)
Eosinophils Relative: 1 %
HEMATOCRIT: 36.3 % (ref 36.0–46.0)
HEMOGLOBIN: 11.6 g/dL — AB (ref 12.0–15.0)
LYMPHS ABS: 1 10*3/uL (ref 0.7–4.0)
Lymphocytes Relative: 11 %
MCH: 31.2 pg (ref 26.0–34.0)
MCHC: 32 g/dL (ref 30.0–36.0)
MCV: 97.6 fL (ref 78.0–100.0)
Monocytes Absolute: 0.6 10*3/uL (ref 0.1–1.0)
Monocytes Relative: 7 %
NEUTROS ABS: 7.5 10*3/uL (ref 1.7–7.7)
NEUTROS PCT: 81 %
Platelets: 156 10*3/uL (ref 150–400)
RBC: 3.72 MIL/uL — AB (ref 3.87–5.11)
RDW: 13.6 % (ref 11.5–15.5)
WBC: 9.2 10*3/uL (ref 4.0–10.5)

## 2015-09-28 LAB — COMPREHENSIVE METABOLIC PANEL
ALBUMIN: 3.4 g/dL — AB (ref 3.5–5.0)
ALK PHOS: 57 U/L (ref 38–126)
ALT: 11 U/L — ABNORMAL LOW (ref 14–54)
ANION GAP: 11 (ref 5–15)
AST: 19 U/L (ref 15–41)
BILIRUBIN TOTAL: 1.1 mg/dL (ref 0.3–1.2)
BUN: 17 mg/dL (ref 6–20)
CO2: 23 mmol/L (ref 22–32)
Calcium: 8.7 mg/dL — ABNORMAL LOW (ref 8.9–10.3)
Chloride: 105 mmol/L (ref 101–111)
Creatinine, Ser: 1.02 mg/dL — ABNORMAL HIGH (ref 0.44–1.00)
GFR calc Af Amer: 53 mL/min — ABNORMAL LOW (ref >60)
GFR calc non Af Amer: 46 mL/min — ABNORMAL LOW (ref >60)
GLUCOSE: 218 mg/dL — AB (ref 65–99)
Potassium: 4.1 mmol/L (ref 3.5–5.1)
Sodium: 139 mmol/L (ref 135–145)
TOTAL PROTEIN: 6.2 g/dL — AB (ref 6.5–8.1)

## 2015-09-28 LAB — URINALYSIS, ROUTINE W REFLEX MICROSCOPIC
Bilirubin Urine: NEGATIVE
GLUCOSE, UA: NEGATIVE mg/dL
HGB URINE DIPSTICK: NEGATIVE
Ketones, ur: 15 mg/dL — AB
LEUKOCYTES UA: NEGATIVE
Nitrite: NEGATIVE
Protein, ur: NEGATIVE mg/dL
SPECIFIC GRAVITY, URINE: 1.015 (ref 1.005–1.030)
pH: 6.5 (ref 5.0–8.0)

## 2015-09-28 LAB — RETICULOCYTES
RBC.: 3.64 MIL/uL — ABNORMAL LOW (ref 3.87–5.11)
RETIC CT PCT: 1 % (ref 0.4–3.1)
Retic Count, Absolute: 36.4 10*3/uL (ref 19.0–186.0)

## 2015-09-28 LAB — TSH: TSH: 1.571 u[IU]/mL (ref 0.350–4.500)

## 2015-09-28 LAB — FERRITIN: Ferritin: 130 ng/mL (ref 11–307)

## 2015-09-28 LAB — IRON AND TIBC
IRON: 48 ug/dL (ref 28–170)
SATURATION RATIOS: 18 % (ref 10.4–31.8)
TIBC: 262 ug/dL (ref 250–450)
UIBC: 214 ug/dL

## 2015-09-28 LAB — VITAMIN B12: VITAMIN B 12: 264 pg/mL (ref 180–914)

## 2015-09-28 LAB — GLUCOSE, CAPILLARY: Glucose-Capillary: 148 mg/dL — ABNORMAL HIGH (ref 65–99)

## 2015-09-28 LAB — FOLATE: Folate: 19.9 ng/mL (ref >5.9)

## 2015-09-28 MED ORDER — LORATADINE 10 MG PO TABS: 10.0000 mg | ORAL_TABLET | Freq: Every day | ORAL | Status: DC | PRN

## 2015-09-28 MED ORDER — OMEGA-3-ACID ETHYL ESTERS 1 G PO CAPS
1.0000 g | ORAL_CAPSULE | Freq: Every day | ORAL | Status: DC
Start: 2015-09-28 — End: 2015-09-30
  Administered 2015-09-28 – 2015-09-30 (×3): 1 g via ORAL
  Filled 2015-09-28 (×3): qty 1

## 2015-09-28 MED ORDER — SODIUM CHLORIDE 0.9% FLUSH
3.0000 mL | Freq: Two times a day (BID) | INTRAVENOUS | Status: DC
  Administered 2015-09-29 – 2015-09-30 (×2): 3 mL via INTRAVENOUS

## 2015-09-28 MED ORDER — ACETAMINOPHEN 650 MG RE SUPP: 650.0000 mg | Freq: Four times a day (QID) | RECTAL | Status: DC | PRN

## 2015-09-28 MED ORDER — ONDANSETRON 4 MG PO TBDP: 4.0000 mg | ORAL_TABLET | ORAL | Status: DC | PRN

## 2015-09-28 MED ORDER — POLYVINYL ALCOHOL 1.4 % OP SOLN
1.0000 [drp] | Freq: Every day | OPHTHALMIC | Status: DC | PRN
  Filled 2015-09-28: qty 15

## 2015-09-28 MED ORDER — INSULIN ASPART 100 UNIT/ML ~~LOC~~ SOLN
0.0000 [IU] | Freq: Three times a day (TID) | SUBCUTANEOUS | Status: DC
  Administered 2015-09-29 – 2015-09-30 (×3): 1 [IU] via SUBCUTANEOUS

## 2015-09-28 MED ORDER — SODIUM CHLORIDE 0.9 % IV SOLN
INTRAVENOUS | Status: DC
  Administered 2015-09-28 – 2015-09-30 (×4): via INTRAVENOUS

## 2015-09-28 MED ORDER — FLUTICASONE PROPIONATE 50 MCG/ACT NA SUSP
2.0000 | Freq: Every day | NASAL | Status: DC
  Administered 2015-09-29: 2 via NASAL
  Filled 2015-09-28: qty 16

## 2015-09-28 MED ORDER — MECLIZINE HCL 12.5 MG PO TABS
12.5000 mg | ORAL_TABLET | Freq: Every day | ORAL | Status: DC
  Administered 2015-09-28 – 2015-09-29 (×2): 12.5 mg via ORAL
  Filled 2015-09-28 (×2): qty 1

## 2015-09-28 MED ORDER — MECLIZINE HCL 25 MG PO TABS
12.5000 mg | ORAL_TABLET | Freq: Once | ORAL | Status: AC
  Administered 2015-09-28: 12.5 mg via ORAL
  Filled 2015-09-28: qty 1

## 2015-09-28 MED ORDER — INSULIN ASPART 100 UNIT/ML ~~LOC~~ SOLN: 0.0000 [IU] | Freq: Every day | SUBCUTANEOUS | Status: DC

## 2015-09-28 MED ORDER — GLIMEPIRIDE 1 MG PO TABS
1.5000 mg | ORAL_TABLET | Freq: Every day | ORAL | Status: DC
  Administered 2015-09-29 – 2015-09-30 (×2): 1.5 mg via ORAL
  Filled 2015-09-28 (×2): qty 1.5

## 2015-09-28 MED ORDER — LISINOPRIL 10 MG PO TABS
10.0000 mg | ORAL_TABLET | Freq: Every day | ORAL | Status: DC
  Administered 2015-09-28 – 2015-09-30 (×3): 10 mg via ORAL
  Filled 2015-09-28 (×3): qty 1

## 2015-09-28 MED ORDER — DIAZEPAM 5 MG/ML IJ SOLN
2.5000 mg | Freq: Four times a day (QID) | INTRAMUSCULAR | Status: DC | PRN
  Administered 2015-09-28: 2.5 mg via INTRAVENOUS
  Filled 2015-09-28: qty 2

## 2015-09-28 MED ORDER — LEVOTHYROXINE SODIUM 88 MCG PO TABS
88.0000 ug | ORAL_TABLET | Freq: Every day | ORAL | Status: DC
  Administered 2015-09-29 – 2015-09-30 (×2): 88 ug via ORAL
  Filled 2015-09-28 (×2): qty 1

## 2015-09-28 MED ORDER — RIVAROXABAN 20 MG PO TABS
20.0000 mg | ORAL_TABLET | Freq: Every day | ORAL | Status: DC
  Administered 2015-09-28: 20 mg via ORAL
  Filled 2015-09-28: qty 1

## 2015-09-28 MED ORDER — ACETAMINOPHEN 325 MG PO TABS: 650.0000 mg | ORAL_TABLET | Freq: Four times a day (QID) | ORAL | Status: DC | PRN

## 2015-09-28 NOTE — ED Notes (Signed)
Back from MRI.

## 2015-09-28 NOTE — ED Notes (Signed)
Patient transported to MRI 

## 2015-09-28 NOTE — H&P (Signed)
History and Physical    Amanda [name] [id number] DOB: 1919/02/14 DOA: 09/28/2015   PCP: Mayra Neer, MD  Patient coming from/Resides with: Private residence and lives alone, continues to drive  Chief Complaint: Severe central vertigo  HPI: Amanda [name] is a 82 y.o. WF PMHx high blood pressure, A. fib on Xarelto, diabetes, history of prior CVA, hypothyroidism and low-grade anemia. Patient has a history of recurrent sinus problems acid several procedure and involving the right side. Patient has been troubled by ongoing dizziness and vertigo symptoms for 3 weeks and has been evaluated by her primary care physician as well as by ENT Dr. Lucia Gaskins who discovered right inner ear nerve damage i.e. central vertigo. She was prescribed Antivert but has been intolerant to this due to excessive sedation. She was told by ENT there was nothing to be done to "fix" the problem with the right ear nerve damage. Patient has had several recurrent spells as noted above but this morning seemed to be the worse. She was unable to keep down any medication including her Zofran or Antivert. She had multiple spells of emesis and was unable to stand upright so her family brought her to the ER for further evaluation.  ED Course:  Afebrile-BP 159/60-pulse 64-respirations 18-room air saturations 95% MRI brain without contrast: No acute finding and explain patient's current symptoms Lab data: Sodium 139, potassium 4.1, BUN 17, creatinine 1.8 to, glucose 218, WBC 9200 with neutrophils 81% and absolute atrial serum 0.5%, hemoglobin 11.6, platelets 156,000, TSH 1.571, urinalysis unremarkable except for mild ketones of 15 Antivert 12.5 mg 1   Review of Systems:  In addition to the HPI above,  No Fever-chills, myalgias or other constitutional symptoms No Headache, changes with Vision or hearing, new weakness, tingling, numbness in any extremity, No problems swallowing food or Liquids, indigestion/reflux No Chest  pain, Cough or Shortness of Breath, palpitations, orthopnea or DOE No Abdominal pain, melena or hematochezia, no dark tarry stools No dysuria, hematuria or flank pain No new skin rashes, lesions, masses or bruises, No new joints pains-aches No recent weight gain or loss No polyuria, polydypsia or polyphagia,   Past Medical History  Diagnosis Date  . Chronic atrial fibrillation ([place])   . Chronic anticoagulation   . Hypothyroidism   . Sinus congestion   . Carpal tunnel syndrome   . Diabetes mellitus     NON-INSULIN    Past Surgical History  Procedure Laterality Date  . Cholecystectomy    . Appendectomy    . US echocardiography  12/06/2007    EF 55-60%  . Cardiovascular stress test  07/10/2004    EF 81%     reports that she has never smoked. She has never used smokeless tobacco. She reports that she does not drink alcohol or use illicit drugs.  Mobility: Rolling walker Work history: Retired   Allergies  Allergen Reactions  . Other Other (See Comments)    Calcium w/D causes constipation  . Codeine Nausea Only  . Hydrocodone Nausea Only  . Metoprolol Nausea Only  . Rythmol [Propafenone Hcl] Other (See Comments)    dizzy  . Sulfa Antibiotics Nausea Only    Family History  Problem Relation Age of Onset  . Stroke Mother   . Diabetes Sister   . Heart attack Neg Hx   . Hypertension    . Hypertension Mother     Prior to Admission medications   Medication Sig Start Date End Date Taking? Authorizing Provider  [place] 20 % TINC  Apply 1 application topically daily as needed (bruises, soreness).   Yes Historical Provider, MD  diphenhydramine-acetaminophen (TYLENOL PM) 25-500 MG TABS tablet Take 1 tablet by mouth at bedtime as needed (sleep).   Yes Historical Provider, MD  fluticasone (FLONASE) 50 MCG/ACT nasal spray Place 2 sprays into the nose daily.    Yes Historical Provider, MD  glimepiride (AMARYL) 1 MG tablet Take 1.5 mg by mouth daily with breakfast.    Yes  Historical Provider, MD  levothyroxine (SYNTHROID, LEVOTHROID) 88 MCG tablet Take 88 mcg by mouth daily.     Yes Historical Provider, MD  lisinopril (PRINIVIL,ZESTRIL) 10 MG tablet Take 10 mg by mouth daily.     Yes Historical Provider, MD  loratadine (CLARITIN) 10 MG tablet Take 10 mg by mouth daily as needed for allergies.    Yes Historical Provider, MD  meclizine (ANTIVERT) 25 MG tablet Take one every 6-8 hours as needed for dizziness 09/04/15  Yes Milton Ferguson, MD  Multiple Vitamins-Minerals (PRESERVISION AREDS 2 PO) Take 1 capsule by mouth 2 (two) times daily.    Yes Historical Provider, MD  Omega-3 Fatty Acids (FISH OIL) 1000 MG CAPS Take 1 capsule by mouth every morning.   Yes Historical Provider, MD  ondansetron (ZOFRAN ODT) 4 MG disintegrating tablet 4mg  ODT q4 hours prn nausea/vomit Patient taking differently: Take 4 mg by mouth every 4 (four) hours as needed for nausea or vomiting.  09/04/15  Yes Milton Ferguson, MD  Polyethyl Glycol-Propyl Glycol 0.4-0.3 % SOLN Place 1 drop into both eyes daily as needed (DRY EYES).    Yes Historical Provider, MD  Probiotic Product (PROBIOTIC DAILY PO) Take 2 tablets by mouth daily as needed (DIGESTION).    Yes Historical Provider, MD  rivaroxaban (XARELTO) 20 MG TABS tablet Take 20 mg by mouth daily with supper.   Yes Historical Provider, MD  sodium chloride (OCEAN) 0.65 % SOLN nasal spray Place 1 spray into both nostrils 2 (two) times daily.    Yes Historical Provider, MD    Physical Exam: Filed Vitals:   09/28/15 1100 09/28/15 1200 09/28/15 1200 09/28/15 1556  BP: 159/60 147/96  153/70  Pulse: 64 60  66  Temp:   97.3 F (36.3 C)   TempSrc:      Resp: 18 25    SpO2: 95% 100%  97%      Constitutional: NAD, Anxious regarding ongoing symptoms, comfortable Eyes: PERRL, lids and conjunctivae normal; subtle horizontal nystagmus when tested ENMT: Mucous membranes are moist. Posterior pharynx clear of any exudate or lesions.Normal dentition.  Neck:  normal, supple, no masses, no thyromegaly Respiratory: clear to auscultation bilaterally, no wheezing, no crackles. Normal respiratory effort. No accessory muscle use.  Cardiovascular: Regular rate and rhythm, no murmurs / rubs / gallops. No extremity edema. 2+ pedal pulses. No carotid bruits.  Abdomen: no tenderness, no masses palpated. No hepatosplenomegaly. Bowel sounds positive.  Musculoskeletal: no clubbing / cyanosis. No joint deformity upper and lower extremities. Good ROM, no contractures. Normal muscle tone.  Skin: no rashes, lesions, ulcers. No induration Neurologic: CN 2-12 grossly intact. Sensation intact, DTR normal. Strength 5/5 x all 4 extremities.  Psychiatric: Normal judgment and insight. Alert and oriented x 3. Normal mood. Patient admits to demonstrate subtle short-term memory deficits   Labs on Admission: I have personally reviewed following labs and imaging studies  CBC:  Recent Labs Lab 09/28/15 1053  WBC 9.2  NEUTROABS 7.5  HGB 11.6*  HCT 36.3  MCV 97.6  PLT 156  Basic Metabolic Panel:  Recent Labs Lab 09/28/15 1053  NA 139  K 4.1  CL 105  CO2 23  GLUCOSE 218*  BUN 17  CREATININE 1.02*  CALCIUM 8.7*   GFR: CrCl cannot be calculated (Unknown ideal weight.). Liver Function Tests:  Recent Labs Lab 09/28/15 1053  AST 19  ALT 11*  ALKPHOS 57  BILITOT 1.1  PROT 6.2*  ALBUMIN 3.4*   No results for input(s): LIPASE, AMYLASE in the last 168 hours. No results for input(s): AMMONIA in the last 168 hours. Coagulation Profile: No results for input(s): INR, PROTIME in the last 168 hours. Cardiac Enzymes: No results for input(s): CKTOTAL, CKMB, CKMBINDEX, TROPONINI in the last 168 hours. BNP (last 3 results) No results for input(s): PROBNP in the last 8760 hours. HbA1C: No results for input(s): HGBA1C in the last 72 hours. CBG: No results for input(s): GLUCAP in the last 168 hours. Lipid Profile: No results for input(s): CHOL, HDL, LDLCALC,  TRIG, CHOLHDL, LDLDIRECT in the last 72 hours. Thyroid Function Tests:  Recent Labs  09/28/15 1053  TSH 1.571   Anemia Panel: No results for input(s): VITAMINB12, FOLATE, FERRITIN, TIBC, IRON, RETICCTPCT in the last 72 hours. Urine analysis:    Component Value Date/Time   COLORURINE YELLOW 09/28/2015 [place] 09/28/2015 1511   LABSPEC 1.015 09/28/2015 1511   PHURINE 6.5 09/28/2015 1511   GLUCOSEU NEGATIVE 09/28/2015 1511   HGBUR NEGATIVE 09/28/2015 1511   BILIRUBINUR NEGATIVE 09/28/2015 1511   KETONESUR 15* 09/28/2015 1511   PROTEINUR NEGATIVE 09/28/2015 1511   UROBILINOGEN 0.2 05/23/2014 2023   NITRITE NEGATIVE 09/28/2015 1511   LEUKOCYTESUR NEGATIVE 09/28/2015 1511   Sepsis Labs: @LABRCNTIP (procalcitonin:4,lacticidven:4) )No results found for this or any previous visit (from the past 240 hour(s)).   Radiological Exams on Admission: Mr Brain Wo Contrast  09/28/2015  CLINICAL DATA:  Acute presentation with vertigo, nausea and vomiting. EXAM: MRI HEAD WITHOUT CONTRAST TECHNIQUE: Multiplanar, multiecho pulse sequences of the brain and surrounding structures were obtained without intravenous contrast. COMPARISON:  05/24/2014 FINDINGS: Diffusion imaging does not show any acute or subacute infarction. The brainstem is normal. There is mild cerebellar atrophy without focal insult. The cerebral hemispheres show mild chronic small-vessel changes of the thalami, basal ganglia and hemispheric white matter, less than often seen in healthy individuals of this age. No evidence of cortical or large vessel territory infarction. There is a focus of hemosiderin deposition in the left external capsule region related to an old small vessel insult. No evidence of mass lesion, acute hemorrhage, hydrocephalus or extra-axial collection. No pituitary mass. No inflammatory sinus disease. No skull or skullbase lesion. Major vessels at the base of the brain show flow. IMPRESSION: No acute  finding. Mild chronic small-vessel ischemic changes affecting the cerebral hemispheres, less than often seen in healthy individuals of this age. Old focus of hemosiderin deposition in the external capsule region on the left related to an old small vessel infarction. Electronically Signed   By: Nelson Chimes M.D.   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 -Patient became very sedated on Antivert 25 mg and still felt somewhat sedated after receiving 12.5 mg in ER so have proposed a 6.25 mg dosage every night with attempts to titrate up every 1-2 weeks with hopeful end dose being 25 mg -Acutely will utilize Valium 2.5 mg IV q 6 hrs prn -Continue Zofran prn -Degree of minor volume depletion so we'll give IV fluids overnight -PT evaluation for likely home health follow-up plus PT for vestibular evaluation -Patient's primary care suggested patient obtain walker with a seat and wheel locks to utilize when she becomes dizzy with walking; patient reports increased dizziness if she stands for too long  Active Problems:   Benign hypertensive heart disease without heart failure -Current blood pressure controlled -Continue Zestril    Chronic atrial fibrillation/Chronic anticoagulation -Currently rate controlled -Continue Xarelto for anticoagulation -CHADVASc = 7    Diabetes mellitus type 2, controlled   -CBG greater than 200 -Family reports patient with frequent dietary indiscretion regarding intake of sweets and simple carbohydrates -Continue preadmission glimepiride -Follow CBGs and provide SSI    Hypothyroidism -TSH normal -Continue preadmission dose of Synthroid    History of CVA (cerebrovascular accident) -MRI today without evidence of new stroke    Anemia -Check anemia panel -Vitamin deficiencies if present could be contributing to patient's vertigo symptoms as well      DVT prophylaxis: Xarelto Code Status: Full code Family Communication: Multiple family members at bedside: Son Eugene Gavia, daughter Lamont Dowdy, daughter-in-law Vicente Masson Disposition Plan: Anticipate discharge back to preadmission home environment although patient counseled that she may require slightly more supervision and it was suggested she alternate between her home with relatives staying with her and staying with relatives until her vertigo symptoms are better controlled Consults called: None Admission status: Observation/telemetry    ELLIS,ALLISON L. ANP-BC [facility] Pager [phone]   If 7PM-7AM, please contact night-coverage [email or web address] Password [facility]  09/28/2015, 4:37 PM   Examined patient and discussed assessment and plan with ANP Erin Hearing and agree with above. Care during the described time interval was provided by Myself and ANP Ebony Hail. I have reviewed this patient's available data, including medical history, events of note, physical examination, and all test results as part of my evaluation. I have personally reviewed and interpreted all radiology studies.

## 2015-09-28 NOTE — ED Notes (Signed)
Pt in from home via [facility], pt reported to have dizziness, n/v onset today, per EMS pt had 4-6 vomiting episodes today in route, pt denies SOB, CP, denies diarrhea, A&O x4, follows commands, speaks in complete sentences, pt in A fib with hx of the same, pt seen for similar symptoms x 3 wks, pt dx with Vertigo at that time & took Meclizine, pt seen ENT for symptoms, per EMS ENT took the pt off Meclizine, pt rcvd 8mg  PO Zofran pta

## 2015-09-28 NOTE — ED Provider Notes (Signed)
CSN: [id number]     Arrival date & time 09/28/15  1018 History   First MD Initiated Contact with Patient 09/28/15 1022     Chief Complaint  Patient presents with  . Dizziness     (Consider location/radiation/quality/duration/timing/severity/associated sxs/prior Treatment) Patient is a 82 y.o. female presenting with dizziness. The history is provided by the patient.  Dizziness Quality:  Head spinning and vertigo Associated symptoms: nausea and vomiting   Patient presents with vertigo. She's had over the last 3 weeks. States it is come and gone. States she had seen the ER ENT and primary care. States ENT told her she had some damage to her right ear in the past. Unable tolerate meclizine due to sedation. States she has been worse last couple days. Has had nausea with some vomiting. No fevers or chills. No dysuria. No abdominal pain. No headaches. No confusion. States she does feel weak all over.  Past Medical History  Diagnosis Date  . Chronic atrial fibrillation ([place])   . Chronic anticoagulation   . Hypothyroidism   . Sinus congestion   . Carpal tunnel syndrome   . Diabetes mellitus     NON-INSULIN   Past Surgical History  Procedure Laterality Date  . Cholecystectomy    . Appendectomy    . US echocardiography  12/06/2007    EF 55-60%  . Cardiovascular stress test  07/10/2004    EF 81%   Family History  Problem Relation Age of Onset  . Stroke Mother   . Diabetes Sister   . Heart attack Neg Hx   . Hypertension    . Hypertension Mother    Social History  Substance Use Topics  . Smoking status: Never Smoker   . Smokeless tobacco: Never Used  . Alcohol Use: No   OB History    No data available     Review of Systems  Constitutional: Negative for appetite change.  Respiratory: Negative for chest tightness.   Gastrointestinal: Positive for nausea and vomiting. Negative for abdominal pain.  Genitourinary: Negative for dysuria and flank pain.  Musculoskeletal: Negative  for back pain.  Skin: Negative for wound.  Neurological: Positive for dizziness.      Allergies  Other; Codeine; Hydrocodone; Metoprolol; Rythmol; and Sulfa antibiotics  Home Medications   Prior to Admission medications   Medication Sig Start Date End Date Taking? Authorizing Provider  [place] 20 % TINC Apply 1 application topically daily as needed (bruises, soreness).   Yes Historical Provider, MD  diphenhydramine-acetaminophen (TYLENOL PM) 25-500 MG TABS tablet Take 1 tablet by mouth at bedtime as needed (sleep).   Yes Historical Provider, MD  fluticasone (FLONASE) 50 MCG/ACT nasal spray Place 2 sprays into the nose daily.    Yes Historical Provider, MD  glimepiride (AMARYL) 1 MG tablet Take 1.5 mg by mouth daily with breakfast.    Yes Historical Provider, MD  levothyroxine (SYNTHROID, LEVOTHROID) 88 MCG tablet Take 88 mcg by mouth daily.     Yes Historical Provider, MD  lisinopril (PRINIVIL,ZESTRIL) 10 MG tablet Take 10 mg by mouth daily.     Yes Historical Provider, MD  loratadine (CLARITIN) 10 MG tablet Take 10 mg by mouth daily as needed for allergies.    Yes Historical Provider, MD  meclizine (ANTIVERT) 25 MG tablet Take one every 6-8 hours as needed for dizziness 09/04/15  Yes Milton Ferguson, MD  Multiple Vitamins-Minerals (PRESERVISION AREDS 2 PO) Take 1 capsule by mouth 2 (two) times daily.    Yes Historical Provider,  MD  Omega-3 Fatty Acids (FISH OIL) 1000 MG CAPS Take 1 capsule by mouth every morning.   Yes Historical Provider, MD  ondansetron (ZOFRAN ODT) 4 MG disintegrating tablet 4mg  ODT q4 hours prn nausea/vomit Patient taking differently: Take 4 mg by mouth every 4 (four) hours as needed for nausea or vomiting.  09/04/15  Yes Milton Ferguson, MD  Polyethyl Glycol-Propyl Glycol 0.4-0.3 % SOLN Place 1 drop into both eyes daily as needed (DRY EYES).    Yes Historical Provider, MD  Probiotic Product (PROBIOTIC DAILY PO) Take 2 tablets by mouth daily as needed (DIGESTION).    Yes  Historical Provider, MD  rivaroxaban (XARELTO) 20 MG TABS tablet Take 20 mg by mouth daily with supper.   Yes Historical Provider, MD  sodium chloride (OCEAN) 0.65 % SOLN nasal spray Place 1 spray into both nostrils 2 (two) times daily.    Yes Historical Provider, MD   BP 147/96 mmHg  Pulse 60  Temp(Src) 97.3 F (36.3 C) (Oral)  Resp 25  SpO2 100% Physical Exam  Constitutional: She is oriented to person, place, and time. She appears well-developed.  Eyes:  Mild nystagmus  Neck: Neck supple.  Cardiovascular:  Irregular rhythm  Pulmonary/Chest: Effort normal.  Abdominal: Soft. There is tenderness.  Mild. Umbilical tenderness. May have reducible infraumbilical hernia.  Musculoskeletal: She exhibits no tenderness.  Neurological: She is alert and oriented to person, place, and time.  Finger-nose intact bilaterally. Good grip strength bilaterally. Face symmetric.  Skin: No pallor.    ED Course  Procedures (including critical care time) Labs Review Labs Reviewed  COMPREHENSIVE METABOLIC PANEL - Abnormal; Notable for the following:    Glucose, Bld 218 (*)    Creatinine, Ser 1.02 (*)    Calcium 8.7 (*)    Total Protein 6.2 (*)    Albumin 3.4 (*)    ALT 11 (*)    GFR calc non Af Amer 46 (*)    GFR calc Af Amer 53 (*)    All other components within normal limits  CBC WITH DIFFERENTIAL/PLATELET - Abnormal; Notable for the following:    RBC 3.72 (*)    Hemoglobin 11.6 (*)    All other components within normal limits  TSH  URINALYSIS, ROUTINE W REFLEX MICROSCOPIC (NOT AT [facility])    Imaging Review Mr Brain Wo Contrast  09/28/2015  CLINICAL DATA:  Acute presentation with vertigo, nausea and vomiting. EXAM: MRI HEAD WITHOUT CONTRAST TECHNIQUE: Multiplanar, multiecho pulse sequences of the brain and surrounding structures were obtained without intravenous contrast. COMPARISON:  05/24/2014 FINDINGS: Diffusion imaging does not show any acute or subacute infarction. The brainstem is  normal. There is mild cerebellar atrophy without focal insult. The cerebral hemispheres show mild chronic small-vessel changes of the thalami, basal ganglia and hemispheric white matter, less than often seen in healthy individuals of this age. No evidence of cortical or large vessel territory infarction. There is a focus of hemosiderin deposition in the left external capsule region related to an old small vessel insult. No evidence of mass lesion, acute hemorrhage, hydrocephalus or extra-axial collection. No pituitary mass. No inflammatory sinus disease. No skull or skullbase lesion. Major vessels at the base of the brain show flow. IMPRESSION: No acute finding. Mild chronic small-vessel ischemic changes affecting the cerebral hemispheres, less than often seen in healthy individuals of this age. Old focus of hemosiderin deposition in the external capsule region on the left related to an old small vessel infarction. Electronically Signed   By: Elta Guadeloupe  Shogry M.D.   On: 09/28/2015 14:05   I have personally reviewed and evaluated these images and lab results as part of my medical decision-making.   EKG Interpretation   Date/Time:  Friday Sep 28 2015 10:30:20 EDT Ventricular Rate:  60 PR Interval:    QRS Duration: 91 QT Interval:  425 QTC Calculation: 425 R Axis:   49 Text Interpretation:  Atrial fibrillation Ventricular premature complex  Low voltage, extremity leads Anteroseptal infarct, old Nonspecific T  abnormalities, lateral leads Confirmed by Alvino Chapel  MD, Igor Bishop [phone]) on  09/28/2015 10:34:29 AM      MDM   Final diagnoses:  Vertigo    Patient with dizziness/generalized weakness. Has had on and off for the last 3 weeks. Today severe cannot walk. Has had vomiting with it. Lab work overall reassuring. MRI does not show stroke. Still not able ambulate. Will admit to internal medicine.    Davonna Belling, MD 09/28/15 1505

## 2015-09-29 DIAGNOSIS — D649 Anemia, unspecified: Secondary | ICD-10-CM | POA: Diagnosis present

## 2015-09-29 DIAGNOSIS — Z882 Allergy status to sulfonamides status: Secondary | ICD-10-CM | POA: Diagnosis not present

## 2015-09-29 DIAGNOSIS — R42 Dizziness and giddiness: Secondary | ICD-10-CM | POA: Diagnosis not present

## 2015-09-29 DIAGNOSIS — E119 Type 2 diabetes mellitus without complications: Secondary | ICD-10-CM | POA: Diagnosis present

## 2015-09-29 DIAGNOSIS — Z79899 Other long term (current) drug therapy: Secondary | ICD-10-CM | POA: Diagnosis not present

## 2015-09-29 DIAGNOSIS — E039 Hypothyroidism, unspecified: Secondary | ICD-10-CM | POA: Diagnosis present

## 2015-09-29 DIAGNOSIS — H8141 Vertigo of central origin, right ear: Principal | ICD-10-CM

## 2015-09-29 DIAGNOSIS — Z8249 Family history of ischemic heart disease and other diseases of the circulatory system: Secondary | ICD-10-CM | POA: Diagnosis not present

## 2015-09-29 DIAGNOSIS — I482 Chronic atrial fibrillation: Secondary | ICD-10-CM

## 2015-09-29 DIAGNOSIS — Z823 Family history of stroke: Secondary | ICD-10-CM | POA: Diagnosis not present

## 2015-09-29 DIAGNOSIS — Z888 Allergy status to other drugs, medicaments and biological substances status: Secondary | ICD-10-CM | POA: Diagnosis not present

## 2015-09-29 DIAGNOSIS — Z833 Family history of diabetes mellitus: Secondary | ICD-10-CM | POA: Diagnosis not present

## 2015-09-29 DIAGNOSIS — Z9049 Acquired absence of other specified parts of digestive tract: Secondary | ICD-10-CM | POA: Diagnosis not present

## 2015-09-29 DIAGNOSIS — Z7901 Long term (current) use of anticoagulants: Secondary | ICD-10-CM | POA: Diagnosis not present

## 2015-09-29 DIAGNOSIS — Z8673 Personal history of transient ischemic attack (TIA), and cerebral infarction without residual deficits: Secondary | ICD-10-CM

## 2015-09-29 DIAGNOSIS — Z9889 Other specified postprocedural states: Secondary | ICD-10-CM | POA: Diagnosis not present

## 2015-09-29 DIAGNOSIS — I119 Hypertensive heart disease without heart failure: Secondary | ICD-10-CM | POA: Diagnosis present

## 2015-09-29 LAB — BASIC METABOLIC PANEL
Anion gap: 9 (ref 5–15)
BUN: 18 mg/dL (ref 6–20)
CHLORIDE: 108 mmol/L (ref 101–111)
CO2: 24 mmol/L (ref 22–32)
CREATININE: 0.96 mg/dL (ref 0.44–1.00)
Calcium: 8.4 mg/dL — ABNORMAL LOW (ref 8.9–10.3)
GFR calc Af Amer: 57 mL/min — ABNORMAL LOW (ref >60)
GFR calc non Af Amer: 49 mL/min — ABNORMAL LOW (ref >60)
GLUCOSE: 128 mg/dL — AB (ref 65–99)
Potassium: 4.1 mmol/L (ref 3.5–5.1)
Sodium: 141 mmol/L (ref 135–145)

## 2015-09-29 LAB — CBC
HEMATOCRIT: 32.2 % — AB (ref 36.0–46.0)
HEMOGLOBIN: 10.6 g/dL — AB (ref 12.0–15.0)
MCH: 32.8 pg (ref 26.0–34.0)
MCHC: 32.9 g/dL (ref 30.0–36.0)
MCV: 99.7 fL (ref 78.0–100.0)
Platelets: 147 10*3/uL — ABNORMAL LOW (ref 150–400)
RBC: 3.23 MIL/uL — ABNORMAL LOW (ref 3.87–5.11)
RDW: 13.7 % (ref 11.5–15.5)
WBC: 6.9 10*3/uL (ref 4.0–10.5)

## 2015-09-29 LAB — HEMOGLOBIN A1C
Hgb A1c MFr Bld: 7.2 % — ABNORMAL HIGH (ref 4.8–5.6)
Mean Plasma Glucose: 160 mg/dL

## 2015-09-29 LAB — GLUCOSE, CAPILLARY
GLUCOSE-CAPILLARY: 149 mg/dL — AB (ref 65–99)
GLUCOSE-CAPILLARY: 108 mg/dL — AB (ref 65–99)
Glucose-Capillary: 134 mg/dL — ABNORMAL HIGH (ref 65–99)
Glucose-Capillary: 128 mg/dL — ABNORMAL HIGH (ref 65–99)

## 2015-09-29 MED ORDER — RIVAROXABAN 15 MG PO TABS
15.0000 mg | ORAL_TABLET | Freq: Every day | ORAL | Status: DC
  Administered 2015-09-29: 15 mg via ORAL
  Filled 2015-09-29: qty 1

## 2015-09-29 NOTE — Progress Notes (Signed)
Patient ID: Amanda [name], female   DOB: [birth year], 82 y.o.   MRN: [id number]  PROGRESS NOTE    Amanda [name]  [id number] DOB: [birth year] DOA: [name]  PCP: Amanda Neer, MD   Brief Narrative:  82 y.o. with past medical history of A. fib on Xarelto, diabetes, history of prior CVA, hypothyroidism who presented to [facility] with dizziness and vertigo symptoms for 3 weeks PTA. She has been evaluated by her primary care physician as well as by ENT Dr. Lucia Gaskins who discovered right inner ear nerve damage i.e. central vertigo. She was prescribed Antivert but has been intolerant to this due to excessive sedation. She was told by ENT there was nothing to be done to "fix" the problem with the right ear nerve damage. Patient also had nausea and vomiting.  Pt was hemodynamically stable. Her MRI brain showed no acute intracranial findings.  Assessment & Plan:   Principal Problem:   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 - Will continue meclizine 12.5 mg at bedtime - Obtain PT and vestibular PT eval  Active Problems:   Hypothyroidism - Continue synthroid     History of CVA (cerebrovascular accident) - Stable     Chronic atrial fibrillation (HCC) - CHADS vasc score 4 - On AC with xarelto - HR 60, not on BB    Diabetes mellitus type 2, controlled without long term insulin use (HCC) - Continue glimepiride    Essential hypertension - Continue lisinopril    DVT prophylaxis: on AC with xarelto  Code Status: full code  Family Communication: no family at the bedside Disposition Plan: awaiting PT and vest PT eval; D/C 5/14   Consultants:   Vestibular PT  PT  Procedures:   None   Antimicrobials:   None    Subjective: Still feels vertigo but overall better since admission.   Objective: Filed Vitals:   09/28/15 1913 09/28/15 1918 09/28/15 2247 09/29/15 0500  BP:  168/60 156/77 122/53  Pulse:  64 67 60  Temp:  97.8 F (36.6 C) 98.6 F (37 C) 98.7 F (37.1 C)    TempSrc:   Oral Oral  Resp:  18 16 16   Height: 5\' 2"  (1.575 m)     Weight: 76 kg (167 lb 8.8 oz)     SpO2:  98% 99% 98%    Intake/Output Summary (Last 24 hours) at 09/29/15 1159 Last data filed at 09/29/15 1015  Gross per 24 hour  Intake   1320 ml  Output      0 ml  Net   1320 ml   Filed Weights   09/28/15 1913  Weight: 76 kg (167 lb 8.8 oz)    Examination:  General exam: Appears calm and comfortable  Respiratory system: Clear to auscultation. Respiratory effort normal. Cardiovascular system: S1 & S2 heard, RRR. No JVD, murmurs, rubs, gallops or clicks. No pedal edema. Gastrointestinal system: Abdomen is nondistended, soft and nontender. No organomegaly or masses felt. Normal bowel sounds heard. Central nervous system: Alert and oriented. No focal neurological deficits. Extremities: Symmetric 5 x 5 power. Skin: No rashes, lesions or ulcers Psychiatry: Judgement and insight appear normal. Mood & affect appropriate.   Data Reviewed: I have personally reviewed following labs and imaging studies  CBC:  Recent Labs Lab 09/28/15 1053 09/29/15 0730  WBC 9.2 6.9  NEUTROABS 7.5  --   HGB 11.6* 10.6*  HCT 36.3 32.2*  MCV 97.6 99.7  PLT 156 [id number]*   Basic Metabolic Panel:  Recent Labs Lab  09/28/15 1053 09/29/15 0730  NA 139 141  K 4.1 4.1  CL 105 108  CO2 23 24  GLUCOSE 218* 128*  BUN 17 18  CREATININE 1.02* 0.96  CALCIUM 8.7* 8.4*   GFR: Estimated Creatinine Clearance: 34.2 mL/min (by C-G formula based on Cr of 0.96). Liver Function Tests:  Recent Labs Lab 09/28/15 1053  AST 19  ALT 11*  ALKPHOS 57  BILITOT 1.1  PROT 6.2*  ALBUMIN 3.4*   No results for input(s): LIPASE, AMYLASE in the last 168 hours. No results for input(s): AMMONIA in the last 168 hours. Coagulation Profile: No results for input(s): INR, PROTIME in the last 168 hours. Cardiac Enzymes: No results for input(s): CKTOTAL, CKMB, CKMBINDEX, TROPONINI in the last 168 hours. BNP (last 3  results) No results for input(s): PROBNP in the last 8760 hours. HbA1C:  Recent Labs  09/28/15 1638  HGBA1C 7.2*   CBG:  Recent Labs Lab 09/28/15 2303 09/29/15 0738  GLUCAP 148* 134*   Lipid Profile: No results for input(s): CHOL, HDL, LDLCALC, TRIG, CHOLHDL, LDLDIRECT in the last 72 hours. Thyroid Function Tests:  Recent Labs  09/28/15 1053  TSH 1.571   Anemia Panel:  Recent Labs  09/28/15 1639 09/28/15 1640  VITAMINB12 264  --   FOLATE  --  19.9  FERRITIN 130  --   TIBC 262  --   IRON 48  --   RETICCTPCT 1.0  --    Urine analysis:    Component Value Date/Time   COLORURINE YELLOW 09/28/2015 [place] 09/28/2015 1511   LABSPEC 1.015 09/28/2015 1511   PHURINE 6.5 09/28/2015 1511   GLUCOSEU NEGATIVE 09/28/2015 1511   HGBUR NEGATIVE 09/28/2015 1511   BILIRUBINUR NEGATIVE 09/28/2015 1511   KETONESUR 15* 09/28/2015 1511   PROTEINUR NEGATIVE 09/28/2015 1511   UROBILINOGEN 0.2 05/23/2014 2023   NITRITE NEGATIVE 09/28/2015 1511   LEUKOCYTESUR NEGATIVE 09/28/2015 1511   Sepsis Labs: @LABRCNTIP (procalcitonin:4,lacticidven:4)   )No results found for this or any previous visit (from the past 240 hour(s)).    Radiology Studies: Mr Brain Wo Contrast [name] No acute finding. Mild chronic small-vessel ischemic changes affecting the cerebral hemispheres, 82 less than often seen in healthy individuals of this age. Old focus of hemosiderin deposition in the external capsule region on the left related to an old small vessel infarction. Electronically Signed   By: Nelson Chimes M.D.   On: 09/28/2015 14:05     Scheduled Meds: . fluticasone  2 spray Each Nare Daily  . glimepiride  1.5 mg Oral Q breakfast  . insulin aspart  0-5 Units Subcutaneous QHS  . insulin aspart  0-9 Units Subcutaneous TID WC  . levothyroxine  88 mcg Oral QAC breakfast  . lisinopril  10 mg Oral Daily  . meclizine  12.5 mg Oral QHS  . omega-3 acid ethyl esters  1 g Oral Daily    . rivaroxaban  20 mg Oral Q supper  . sodium chloride flush  3 mL Intravenous Q12H   Continuous Infusions: . sodium chloride 100 mL/hr at 09/29/15 0129      Time spent: 15 minutes  Greater than 50% of the time spent on counseling and coordinating the care.   [name], MD [facility] Pager [phone]  If 7PM-7AM, please contact night-coverage [email or web address] Password [email or web address] 09/29/2015, 11:59 AM

## 2015-09-29 NOTE — Progress Notes (Signed)
Amanda [name] is a 82 y.o. female patient admitted from ED awake, alert - oriented  X 4 - no acute distress noted.  VSS - Blood pressure 156/77, pulse 67, temperature 98.6 F (37 C), temperature source Oral, resp. rate 16, height 5\' 2"  (1.575 m), weight 76 kg (167 lb 8.8 oz), SpO2 99 %.    IV in place, occlusive dsg intact without redness.  Orientation to room, and floor completed with information packet given to patient/family.  Patient declined safety video at this time.  Admission INP armband ID verified with patient/family, and in place.   SR up x 2, fall assessment complete, with patient and family able to verbalize understanding of risk associated with falls, and verbalized understanding to call nsg before up out of bed.  Call light within reach, patient able to voice, and demonstrate understanding.  Skin, clean-dry- intact without evidence of bruising, or skin tears.   No evidence of skin break down noted on exam.     Will cont to eval and treat per MD orders.  Teryl Lucy, RN 09/29/2015 2:41 AM

## 2015-09-29 NOTE — Progress Notes (Addendum)
Pt had a 2.36 sec pulse per CCMD. Assess pt, pt is sleeping and asymptomatic. On call provider text paged. Will cont to monitor.   Pt's HR went down to 35 but did not sustain per CCMD. Pt's HR at 60 now and is sleeping in bed. On call provider text paged. Will cont to monitor

## 2015-09-29 NOTE — Evaluation (Signed)
Physical Therapy Evaluation Patient Details Name: JAQUELYN FERREN MRN: [id number] DOB: 25-Jun-1918 Today's Date: 09/29/2015   History of Present Illness  pt presents with recurrent episodes of "vertigo" and was told by her ENT she had R Inner Ear Nerve Damage and was prescribed Antivert, but didn't take because it made her feel sedated.  pt with hx of HTN, DM, CVA, A-fib, and Anemia.    Clinical Impression  Lengthy discussion with pt and granddaughter about pt's vertigo symptoms and safety with mobility.  Pt's symptoms are related to her R ear nerve damage and indicates there are no differences in relation to positional changes.  Discussed continued mobility and needs for further A from family, however pt is resistant to having 24hr S at home.  Pt declined ambulation at this time due to her feelings of lightheadedness, which is how she describes her vertigo.  Discussed taking her antivert and if that resolved her symptoms, however pt seems unsure due to the meds making her feel drowsy.  Would recommend that pt have f/u from vestibular therapist at home.  Will continue to follow while on acute.      Follow Up Recommendations Home health PT;Supervision/Assistance - 24 hour    Equipment Recommendations  None recommended by PT    Recommendations for Other Services       Precautions / Restrictions Precautions Precautions: Fall Restrictions Weight Bearing Restrictions: No      Mobility  Bed Mobility Overal bed mobility: Needs Assistance Bed Mobility: Sit to Supine       Sit to supine: Supervision      Transfers                    Ambulation/Gait                Stairs            Wheelchair Mobility    Modified Rankin (Stroke Patients Only)       Balance Overall balance assessment: Needs assistance Sitting-balance support: No upper extremity supported;Feet supported Sitting balance-Leahy Scale: Good                                        Pertinent Vitals/Pain Pain Assessment: No/denies pain    Home Living Family/patient expects to be discharged to:: Private residence Living Arrangements: Children Available Help at Discharge: Available PRN/intermittently Type of Home: House Home Access: Stairs to enter Entrance Stairs-Rails: Left Entrance Stairs-Number of Steps:  (4) Home Layout: Multi-level Home Equipment: Shower seat;Grab bars - tub/shower Additional Comments: pt's main level has her kitchen, but her bedroom and master bath are up the 7 stairs.    Prior Function Level of Independence: Independent               Hand Dominance   Dominant Hand: Right    Extremity/Trunk Assessment   Upper Extremity Assessment: Defer to OT evaluation           Lower Extremity Assessment: Generalized weakness      Cervical / Trunk Assessment: Kyphotic  Communication   Communication: HOH  Cognition Arousal/Alertness: Awake/alert Behavior During Therapy: WFL for tasks assessed/performed Overall Cognitive Status: Within Functional Limits for tasks assessed                      General Comments      Exercises  Assessment/Plan    PT Assessment Patient needs continued PT services  PT Diagnosis Difficulty walking;Generalized weakness   PT Problem List Decreased strength;Decreased activity tolerance;Decreased balance;Decreased mobility;Decreased coordination;Decreased knowledge of use of DME  PT Treatment Interventions DME instruction;Gait training;Stair training;Functional mobility training;Therapeutic activities;Therapeutic exercise;Balance training;Neuromuscular re-education;Patient/family education   PT Goals (Current goals can be found in the Care Plan section) Acute Rehab PT Goals Patient Stated Goal: Go home PT Goal Formulation: With patient/family Time For Goal Achievement: 10/13/15 Potential to Achieve Goals: Good    Frequency Min 3X/week   Barriers to discharge         Co-evaluation               End of Session   Activity Tolerance: Patient tolerated treatment well Patient left: in bed;with call bell/phone within reach;with family/visitor present Nurse Communication: Mobility status         Time: [id number] PT Time Calculation (min) (ACUTE ONLY): 57 min   Charges:   PT Evaluation $PT Eval Moderate Complexity: 1 Procedure PT Treatments $Therapeutic Activity: 23-37 mins   PT G CodesCatarina Hartshorn, [place] [id number] 09/29/2015, 1:22 PM

## 2015-09-29 NOTE — Evaluation (Signed)
Occupational Therapy Evaluation Patient Details Name: Amanda [name] MRN: [id number] DOB: 1918-08-09 Today's Date: 09/29/2015    History of Present Illness 82 y.o. WF PMHx high blood pressure, A. fib on Xarelto, diabetes, history of prior CVA, hypothyroidism and low-grade anemia. Patient has a history of recurrent sinus problems acid several procedure and involving the right side. Patient has been troubled by ongoing dizziness and vertigo symptoms for 3 weeks and has been evaluated by her primary care physician as well as by ENT Dr. Lucia Gaskins who discovered right inner ear nerve damage i.e. central vertigo. She was prescribed Antivert but has been intolerant to this due to excessive sedation. She was told by ENT there was nothing to be done to "fix" the problem with the right ear nerve damage. Patient has had several recurrent spells as noted above but this morning seemed to be the worse. She was unable to keep down any medication including her Zofran or Antivert. She had multiple spells of emesis and was unable to stand upright so her family brought her to the ER for further evaluation.   Clinical Impression   Pt. Was able to perform UE/LE ADLs at Mod I level with extra time required. Pt. Will take rest breaks to avoid feeling lightheaded. Pt. Was Mod I with sit to stand from chair and stood for a few seconds prior to AMB to avoid feeling dizzy. Pt. Stated she did not want walker to AMB to bathroom. Pt. Was Mod I AMB slowly to bathroom. Pt. Was Mod I with sit to stand and stand to sit from commode with grab bar. Pt. Did not have any LOB with tasks and stated she felt as well as she does at home performing tasks. Pt. Does cook and perform home management tasks and would benefit from HHOT eval tio assess for safety.     Follow Up Recommendations  Home health OT    Equipment Recommendations       Recommendations for Other Services       Precautions / Restrictions Precautions Precautions:  Fall Restrictions Weight Bearing Restrictions: No      Mobility Bed Mobility                  Transfers Overall transfer level: Modified independent                    Balance                                            ADL Overall ADL's : At baseline                                       General ADL Comments:  (Pt. uses good strategies to slow down and takes rest breaks.)     Vision     Perception     Praxis      Pertinent Vitals/Pain Pain Assessment: No/denies pain     Hand Dominance Right   Extremity/Trunk Assessment Upper Extremity Assessment Upper Extremity Assessment: Generalized weakness           Communication Communication Communication: HOH   Cognition Arousal/Alertness: Awake/alert Behavior During Therapy: WFL for tasks assessed/performed Overall Cognitive Status: Within Functional Limits for tasks assessed  General Comments       Exercises       Shoulder Instructions      Home Living Family/patient expects to be discharged to:: Private residence Living Arrangements: Children Available Help at Discharge: Available PRN/intermittently Type of Home: House Home Access: Stairs to enter [employer] of Steps:  (4) Entrance Stairs-Rails: Left Home Layout: Multi-level Alternate Level Stairs-Number of Steps:  (7) Alternate Level Stairs-Rails: Left Bathroom Shower/Tub: Occupational psychologist: Standard     Home Equipment: Shower seat;Grab bars - tub/shower          Prior Functioning/Environment Level of Independence: Independent             OT Diagnosis: Generalized weakness   OT Problem List:     OT Treatment/Interventions:      OT Goals(Current goals can be found in the care plan section) Acute Rehab OT Goals Patient Stated Goal:  (go home)  OT Frequency:     Barriers to D/C:            Co-evaluation               End of Session    Activity Tolerance: Patient tolerated treatment well Patient left: in chair;with call bell/phone within reach;with family/visitor present   Time: [id number] OT Time Calculation (min): 38 min Charges:  OT General Charges $OT Visit: 1 Procedure OT Evaluation $OT Eval Moderate Complexity: 1 Procedure OT Treatments $Self Care/Home Management : 8-22 mins G-Codes:    Kabella Cassidy 10-Oct-2015, 10:17 AM

## 2015-09-30 LAB — GLUCOSE, CAPILLARY: Glucose-Capillary: 122 mg/dL — ABNORMAL HIGH (ref 65–99)

## 2015-09-30 MED ORDER — RIVAROXABAN 15 MG PO TABS: 15.0000 mg | ORAL_TABLET | Freq: Every day | ORAL | Status: DC

## 2015-09-30 NOTE — Progress Notes (Signed)
Pt had two pulses within 6 sec stripe per CCMD. 2.5 sec and 2.08 sec. Assess pt, pt is asleep and asypmtomatic. HR at 61 now. On call provider text paged. Will cont to monitor

## 2015-09-30 NOTE — Discharge Summary (Signed)
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 with past medical history of A. fib on Xarelto, diabetes, history of prior CVA, hypothyroidism who presented to [facility] with dizziness and vertigo symptoms for 3 weeks PTA. She has been evaluated by her primary care physician as well as by ENT Dr. Lucia Gaskins who discovered right inner ear nerve damage i.e. central vertigo. She was prescribed Antivert but has been intolerant to this due to excessive sedation. She was told by ENT there was nothing to be done to "fix" the problem with the right ear nerve damage. Patient also had nausea and vomiting.   Pt was hemodynamically stable. Her MRI brain showed no acute intracranial findings.  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 - Will continue meclizine per home dose - Obtain PT and vestibular PT eval  Active Problems:  Hypothyroidism - Continue synthroid    History of CVA (cerebrovascular accident) - Stable    Chronic atrial fibrillation (HCC) - CHADS vasc score 4 - On AC with xarelto, lower dose 15 mg daily  - HR 68, not on BB   Diabetes mellitus type 2, controlled without long term insulin use (HCC) - Continue glimepiride    Essential hypertension - Continue lisinopril    DVT prophylaxis: on AC with xarelto  Code Status: full code  Family Communication: son updated over the phone today  Consultants:   Vestibular PT  PT  Procedures:   None  Antimicrobials:   None   Signed:  [name], MD  [facility] 09/30/2015, 8:58 AM  Pager #: [phone]  Time spent in minutes: more than 30 minutes   Discharge Exam: Filed Vitals:   09/29/15 2153 09/30/15 0439  BP: 155/83 160/81  Pulse: 57 68  Temp: 98 F (36.7 C) 97.8 F (36.6 C)  Resp: 18 18   Filed Vitals:   09/29/15 0500 09/29/15 1345 09/29/15 2153 09/30/15 0439  BP: 122/53 125/57 155/83 160/81  Pulse: 60 71 57 68  Temp: 98.7 F (37.1 C) 97.6 F (36.4 C) 98 F (36.7 C) 97.8 F (36.6 C)  TempSrc: Oral  Oral Oral  Resp: 16 19 18 18   Height:      Weight:      SpO2: 98% 98% 97% 99%    General: Pt is alert, follows commands appropriately, not in acute distress Cardiovascular: Regular rate and rhythm, S1/S2 + Respiratory: Clear to auscultation bilaterally, no wheezing, no crackles, no rhonchi Abdominal: Soft, non tender, non distended, bowel sounds +, no guarding Extremities: no cyanosis, pulses palpable bilaterally DP and PT Neuro: Grossly nonfocal  Discharge Instructions  Discharge Instructions    Call MD for:  difficulty breathing, headache or visual disturbances    Complete by:  As directed      Call  MD for:  persistant dizziness or light-headedness    Complete by:  As directed      Call MD for:  persistant nausea and vomiting    Complete by:  As directed      Call MD for:  severe uncontrolled pain    Complete by:  As directed      Diet - low sodium heart healthy    Complete by:  As directed      Discharge instructions    Complete by:  As directed   Please note we decreased his overall to from 20 mg to 15 mg daily per pharmacy recommendations     Increase activity slowly    Complete by:  As directed              Medication List    TAKE these medications        Arnica 20 % Tinc  Apply 1 application topically daily as needed (bruises, soreness).     diphenhydramine-acetaminophen 25-500 MG Tabs tablet  Commonly known as:  TYLENOL PM  Take 1 tablet by mouth at bedtime as needed (sleep).     Fish Oil 1000 MG Caps  Take 1 capsule by mouth every morning.     fluticasone 50 MCG/ACT nasal spray  Commonly known as:  FLONASE  Place 2 sprays into the nose daily.     glimepiride 1 MG tablet  Commonly known as:  AMARYL  Take 1.5 mg by mouth daily with breakfast.     levothyroxine 88 MCG tablet  Commonly known as:  SYNTHROID, LEVOTHROID  Take 88 mcg by mouth daily.     lisinopril 10 MG tablet  Commonly known as:  PRINIVIL,ZESTRIL  Take 10 mg by mouth daily.     loratadine 10 MG tablet  Commonly known as:  CLARITIN  Take 10 mg by mouth daily as needed for allergies.     meclizine 25 MG tablet  Commonly known as:  ANTIVERT  Take one every 6-8 hours as needed for dizziness     ondansetron 4 MG disintegrating tablet  Commonly known as:  ZOFRAN ODT  4mg  ODT q4 hours prn nausea/vomit     Polyethyl Glycol-Propyl Glycol 0.4-0.3 % Soln  Place 1 drop into both eyes daily as needed (DRY EYES).     PRESERVISION AREDS 2 PO  Take 1 capsule by mouth 2 (two) times daily.     PROBIOTIC DAILY PO  Take 2 tablets by mouth daily as needed (DIGESTION).     Rivaroxaban 15 MG Tabs tablet  Commonly known as:  XARELTO  Take 1 tablet (15 mg total) by mouth daily with supper.     sodium chloride 0.65 % Soln nasal spray  Commonly known as:  OCEAN  Place 1 spray into both nostrils 2 (two) times daily.           Follow-up Information    Follow up with SHAW,[name], MD. Schedule an appointment as soon as possible for a visit in 1 week.   Specialty:  Family Medicine   Why:  Follow up appt after recent hospitalization   Contact information:   [address] [place]  [address] [phone]        The results of significant diagnostics from this hospitalization (including imaging, microbiology, ancillary and laboratory) are listed below for reference.    Significant Diagnostic Studies: Mr Brain Wo Contrast  09/28/2015  CLINICAL DATA:  Acute presentation with vertigo, nausea and vomiting. EXAM: MRI  HEAD WITHOUT CONTRAST TECHNIQUE: Multiplanar, multiecho pulse sequences of the brain and surrounding structures were obtained without intravenous contrast. COMPARISON:  05/24/2014 FINDINGS: Diffusion imaging does not show any acute or subacute infarction. The brainstem is normal. There is mild cerebellar atrophy without focal insult. The cerebral hemispheres show mild chronic small-vessel changes of the thalami, basal ganglia and hemispheric white matter, less than often seen in healthy individuals of this age. No evidence of cortical or large vessel territory infarction. There is a focus of hemosiderin deposition in the left external capsule region related to an old small vessel insult. No evidence of mass lesion, acute hemorrhage, hydrocephalus or extra-axial collection. No pituitary mass. No inflammatory sinus disease. No skull or skullbase lesion. Major vessels at the base of the brain show flow. IMPRESSION: No acute finding. Mild chronic small-vessel ischemic changes affecting the cerebral hemispheres, less than often seen in healthy individuals of this age. Old focus of hemosiderin deposition in the external capsule region on the left related to an old small vessel infarction. Electronically Signed   By: Nelson Chimes M.D.   On: 09/28/2015 14:05   Dg Abd Acute W/chest  09/04/2015  CLINICAL DATA:  Chest heaviness and epigastric pressure beginning today. Weakness. Initial encounter. EXAM: DG ABDOMEN ACUTE W/ 1V CHEST COMPARISON:  Single view of the chest 05/24/2014 and PA and lateral chest 05/17/2004. FINDINGS: Single view of the chest demonstrates cardiomegaly without  edema. The lungs are clear. No pneumothorax or pleural effusion. Two views of the abdomen show no free intraperitoneal air. The bowel gas pattern is normal. No unexpected abdominal calcification or focal bony abnormality is identified. IMPRESSION: Negative exam. Electronically Signed   By: Inge Rise M.D.   On: 09/04/2015 12:55    Microbiology: No results found for this or any previous visit (from the past 240 hour(s)).   Labs: Basic Metabolic Panel:  Recent Labs Lab 09/28/15 1053 09/29/15 0730  NA 139 141  K 4.1 4.1  CL 105 108  CO2 23 24  GLUCOSE 218* 128*  BUN 17 18  CREATININE 1.02* 0.96  CALCIUM 8.7* 8.4*   Liver Function Tests:  Recent Labs Lab 09/28/15 1053  AST 19  ALT 11*  ALKPHOS 57  BILITOT 1.1  PROT 6.2*  ALBUMIN 3.4*   No results for input(s): LIPASE, AMYLASE in the last 168 hours. No results for input(s): AMMONIA in the last 168 hours. CBC:  Recent Labs Lab 09/28/15 1053 09/29/15 0730  WBC 9.2 6.9  NEUTROABS 7.5  --   HGB 11.6* 10.6*  HCT 36.3 32.2*  MCV 97.6 99.7  PLT 156 147*   Cardiac Enzymes: No results for input(s): CKTOTAL, CKMB, CKMBINDEX, TROPONINI in the last 168 hours. BNP: BNP (last 3 results) No results for input(s): BNP in the last 8760 hours.  ProBNP (last 3 results) No results for input(s): PROBNP in the last 8760 hours.  CBG:  Recent Labs Lab 09/29/15 0738 09/29/15 1156 09/29/15 1648 09/29/15 2149 09/30/15 0830  GLUCAP 134* 149* 108* 128* 122*

## 2015-09-30 NOTE — Discharge Instructions (Signed)
Rivaroxaban oral tablets What is this medicine? RIVAROXABAN (ri va ROX a ban) is an anticoagulant (blood thinner). It is used to treat blood clots in the lungs or in the veins. It is also used after knee or hip surgeries to prevent blood clots. It is also used to lower the chance of stroke in people with a medical condition called atrial fibrillation. This medicine may be used for other purposes; ask your health care provider or pharmacist if you have questions. What should I tell my health care provider before I take this medicine? They need to know if you have any of these conditions: -bleeding disorders -bleeding in the brain -blood in your stools (black or tarry stools) or if you have blood in your vomit -history of stomach bleeding -kidney disease -liver disease -low blood counts, like low white cell, platelet, or red cell counts -recent or planned spinal or epidural procedure -take medicines that treat or prevent blood clots -an unusual or allergic reaction to rivaroxaban, other medicines, foods, dyes, or preservatives -pregnant or trying to get pregnant -breast-feeding How should I use this medicine? Take this medicine by mouth with a glass of water. Follow the directions on the prescription label. Take your medicine at regular intervals. Do not take it more often than directed. Do not stop taking except on your doctor's advice. Stopping this medicine may increase your risk of a blood clot. Be sure to refill your prescription before you run out of medicine. If you are taking this medicine after hip or knee replacement surgery, take it with or without food. If you are taking this medicine for atrial fibrillation, take it with your evening meal. If you are taking this medicine to treat blood clots, take it with food at the same time each day. If you are unable to swallow your tablet, you may crush the tablet and mix it in applesauce. Then, immediately eat the applesauce. You should eat more  food right after you eat the applesauce containing the crushed tablet. Talk to your pediatrician regarding the use of this medicine in children. Special care may be needed. Overdosage: If you think you have taken too much of this medicine contact a poison control center or emergency room at once. NOTE: This medicine is only for you. Do not share this medicine with others. What if I miss a dose? If you take your medicine once a day and miss a dose, take the missed dose as soon as you remember. If you take your medicine twice a day and miss a dose, take the missed dose immediately. In this instance, 2 tablets may be taken at the same time. The next day you should take 1 tablet twice a day as directed. What may interact with this medicine? -aspirin and aspirin-like medicines -certain antibiotics like erythromycin, azithromycin, and clarithromycin -certain medicines for fungal infections like ketoconazole and itraconazole -certain medicines for irregular heart beat like amiodarone, quinidine, dronedarone -certain medicines for seizures like carbamazepine, phenytoin -certain medicines that treat or prevent blood clots like warfarin, enoxaparin, and dalteparin -conivaptan -diltiazem -felodipine -indinavir -lopinavir; ritonavir -NSAIDS, medicines for pain and inflammation, like ibuprofen or naproxen -ranolazine -rifampin -ritonavir -St. John's wort -verapamil This list may not describe all possible interactions. Give your health care provider a list of all the medicines, herbs, non-prescription drugs, or dietary supplements you use. Also tell them if you smoke, drink alcohol, or use illegal drugs. Some items may interact with your medicine. What should I watch for while using this   medicine? Visit your doctor or health care professional for regular checks on your progress. Your condition will be monitored carefully while you are receiving this medicine. Notify your doctor or health care  professional and seek emergency treatment if you develop breathing problems; changes in vision; chest pain; severe, sudden headache; pain, swelling, warmth in the leg; trouble speaking; sudden numbness or weakness of the face, arm, or leg. These can be signs that your condition has gotten worse. If you are going to have surgery, tell your doctor or health care professional that you are taking this medicine. Tell your health care professional that you use this medicine before you have a spinal or epidural procedure. Sometimes people who take this medicine have bleeding problems around the spine when they have a spinal or epidural procedure. This bleeding is very rare. If you have a spinal or epidural procedure while on this medicine, call your health care professional immediately if you have back pain, numbness or tingling (especially in your legs and feet), muscle weakness, paralysis, or loss of bladder or bowel control. Avoid sports and activities that might cause injury while you are using this medicine. Severe falls or injuries can cause unseen bleeding. Be careful when using sharp tools or knives. Consider using an electric razor. Take special care brushing or flossing your teeth. Report any injuries, bruising, or red spots on the skin to your doctor or health care professional. What side effects may I notice from receiving this medicine? Side effects that you should report to your doctor or health care professional as soon as possible: -allergic reactions like skin rash, itching or hives, swelling of the face, lips, or tongue -back pain -redness, blistering, peeling or loosening of the skin, including inside the mouth -signs and symptoms of bleeding such as bloody or black, tarry stools; red or dark-brown urine; spitting up blood or brown material that looks like coffee grounds; red spots on the skin; unusual bruising or bleeding from the eye, gums, or nose Side effects that usually do not require  medical attention (Report these to your doctor or health care professional if they continue or are bothersome.): -dizziness -muscle pain This list may not describe all possible side effects. Call your doctor for medical advice about side effects. You may report side effects to FDA at 1-800-FDA-1088. Where should I keep my medicine? Keep out of the reach of children. Store at room temperature between 15 and 30 degrees C (59 and 86 degrees F). Throw away any unused medicine after the expiration date. NOTE: This sheet is a summary. It may not cover all possible information. If you have questions about this medicine, talk to your doctor, pharmacist, or health care provider.    2016, Elsevier/Gold Standard. (2014-05-03 12:45:34)  

## 2015-10-10 ENCOUNTER — Encounter: Payer: Self-pay | Admitting: Cardiovascular Disease

## 2015-10-10 ENCOUNTER — Ambulatory Visit (INDEPENDENT_AMBULATORY_CARE_PROVIDER_SITE_OTHER): Payer: Medicare Other | Admitting: Cardiovascular Disease

## 2015-10-10 VITALS — BP 156/66 | HR 80 | Ht 62.0 in | Wt 166.4 lb

## 2015-10-10 DIAGNOSIS — I482 Chronic atrial fibrillation, unspecified: Secondary | ICD-10-CM

## 2015-10-10 DIAGNOSIS — I639 Cerebral infarction, unspecified: Secondary | ICD-10-CM

## 2015-10-10 MED ORDER — RIVAROXABAN 15 MG PO TABS: 15.0000 mg | ORAL_TABLET | Freq: Every day | ORAL | Status: DC

## 2015-10-10 NOTE — Progress Notes (Signed)
Cardiology Office Note   Date:  10/10/2015   ID:  Amanda [name], DOB 11/04/1918, MRN [id number]  PCP:  Mayra Neer, MD  Cardiologist: Darlin Coco MD  Chief Complaint  Patient presents with  . Follow-up    atrial fib   Problem List 1. Chronic atrial fib 2. CVA 3. Diabetes mellitus 4. Essential HTN 5. Hypothyroidism    History of Present Illness: Amanda [name] is a 82 y.o. female who presents for a scheduled follow-up visit.  This pleasant 82 year old lady is seen for a scheduled followup office visit. She has a history of established atrial fibrillation and is on long-term anticoagulation.. She does not have any history of ischemic heart disease. She had a normal nuclear stress test in 2006. She had an echocardiogram in 2009 which showed mild left atrial enlargement. The patient has a history of essential hypertension and diabetes and hypothyroidism. Since last visit she has been feeling well. She continues to live by herself in her own home. She still drives herself to her appointments and to the grocery store.  The patient had a recent hospital admission for 2 strokes which left her with initial difficulty with speaking.Her speech has subsequently returned to normal. The patient is now on Xarelto rather than warfarin. She denies any new cardiac symptoms. No chest pain. No shortness of breath. No dizziness or syncope. No awareness of rapid palpitations. She still drives and lives by herself.  She has occasional episodes of dizziness.  No syncope.  She has not been having any chest pain or increased shortness of breath.  She has not had any recurrent episodes of stroke or TIA  Oct 10, 2015:  Amanda [name] is seen for the 1st time - transfer from [place].   Seen with daughter in law - [name] .  Was hospitalized several weeks ago with vertigo  Has chronic A-fib - is on Xarelto 15 mg a day - we reduced the dose recently   Still lives alone,    Gets  help with grocery shopping .     Past Medical History  Diagnosis Date  . Chronic atrial fibrillation ([place])   . Chronic anticoagulation   . Hypothyroidism   . Sinus congestion   . Carpal tunnel syndrome   . Diabetes mellitus     NON-INSULIN    Past Surgical History  Procedure Laterality Date  . Cholecystectomy    . Appendectomy    . US echocardiography  12/06/2007    EF 55-60%  . Cardiovascular stress test  07/10/2004    EF 81%     Current Outpatient Prescriptions  Medication Sig Dispense Refill  . Arnica 20 % TINC Apply 1 application topically daily as needed (bruises, soreness).    . diphenhydramine-acetaminophen (TYLENOL PM) 25-500 MG TABS tablet Take 1 tablet by mouth at bedtime as needed (sleep).    Marland Kitchen glimepiride (AMARYL) 1 MG tablet Take 1.5 mg by mouth daily with breakfast.     . levothyroxine (SYNTHROID, LEVOTHROID) 88 MCG tablet Take 88 mcg by mouth daily.      Marland Kitchen lisinopril (PRINIVIL,ZESTRIL) 10 MG tablet Take 10 mg by mouth daily.      . meclizine (ANTIVERT) 25 MG tablet Take one every 6-8 hours as needed for dizziness 20 tablet 0  . Multiple Vitamins-Minerals (PRESERVISION AREDS 2 PO) Take 1 capsule by mouth 2 (two) times daily.     . Omega-3 Fatty Acids (FISH OIL) 1000 MG CAPS Take 1 capsule by mouth every morning.    Marland Kitchen  ondansetron (ZOFRAN ODT) 4 MG disintegrating tablet 4mg  ODT q4 hours prn nausea/vomit (Patient taking differently: Take 4 mg by mouth every 4 (four) hours as needed for nausea or vomiting. ) 12 tablet 1  . Polyethyl Glycol-Propyl Glycol 0.4-0.3 % SOLN Place 1 drop into both eyes daily as needed (DRY EYES).     . Probiotic Product (PROBIOTIC DAILY PO) Take 2 tablets by mouth daily as needed (DIGESTION).     . Rivaroxaban (XARELTO) 15 MG TABS tablet Take 1 tablet (15 mg total) by mouth daily with supper. 30 tablet 0  . sodium chloride (OCEAN) 0.65 % SOLN nasal spray Place 1 spray into both nostrils 2 (two) times daily.      No current  facility-administered medications for this visit.    Allergies:   Other; Codeine; Hydrocodone; Metoprolol; Rythmol; and Sulfa antibiotics    Social History:  The patient  reports that she has never smoked. She has never used smokeless tobacco. She reports that she does not drink alcohol or use illicit drugs.   Family History:  The patient's family history includes Diabetes in her sister; Hypertension in her mother; Stroke in her mother. There is no history of Heart attack.    ROS:  Please see the history of present illness.   Otherwise, review of systems are positive for none.   All other systems are reviewed and negative.    PHYSICAL EXAM: VS:  BP 156/66 mmHg  Pulse 80  Ht 5\' 2"  (1.575 m)  Wt 166 lb 6.4 oz (75.479 kg)  BMI 30.43 kg/m2 , BMI Body mass index is 30.43 kg/(m^2). GEN: Well nourished, well developed, in no acute distress HEENT: normal Neck: no JVD, carotid bruits, or masses Cardiac: Irregularly irregular rhythm.  There is a midsystolic click.No, rubs, or gallops,no edema  Respiratory:  clear to auscultation bilaterally, normal work of breathing GI: soft, nontender, nondistended, + BS MS: no deformity or atrophy Skin: warm and dry, no rash Neuro:  Strength and sensation are intact Psych: euthymic mood, full affect   EKG:  EKG is ordered today. The ekg ordered today demonstrates atrial fibrillation with controlled ventricular response at 74 bpm   Recent Labs: 09/28/2015: ALT 11*; TSH 1.571 09/29/2015: BUN 18; Creatinine, Ser 0.96; Hemoglobin 10.6*; Platelets 147*; Potassium 4.1; Sodium 141    Lipid Panel    Component Value Date/Time   CHOL 189 05/24/2014 0534   TRIG 147 05/24/2014 0534   HDL 47 05/24/2014 0534   CHOLHDL 4.0 05/24/2014 0534   VLDL 29 05/24/2014 0534   LDLCALC 113* 05/24/2014 0534   LDLDIRECT 137.0 01/06/2012 1211      Wt Readings from Last 3 Encounters:  10/10/15 166 lb 6.4 oz (75.479 kg)  09/28/15 167 lb 8.8 oz (76 kg)  09/04/15 168 lb  (76.204 kg)        ASSESSMENT AND PLAN:  1. Permanent atrial fibrillation- Doing well, rate is well controlled.  Continue Xarelto 15 mg a day .   Has rare nose bleeds.   2. essential hypertension without congestive heart failure Stable   3. Hypercholesterolemia  4. Hypothyroidism  5. Recent stroke felt to be embolic occurring at a time that her INR was 1.9. She is now on Xarelto instead of warfarin.  Will see her in 6 months   Current medicines are reviewed at length with the patient today.  The patient does not have concerns regarding medicines.  The following changes have been made:  no change  Labs/ tests ordered  today include:   No orders of the defined types were placed in this encounter.     Disposition: No change in meds     Mertie Moores, MD  10/10/2015 1:06 PM    [address],  [place], [place]  [address] Pager [phone] Phone: [phone]; Fax: [phone]

## 2015-10-10 NOTE — Patient Instructions (Signed)
Medication Instructions:  Your physician recommends that you continue on your current medications as directed. Please refer to the Current Medication list given to you today. A refill of your Xarelto was sent to your pharmacy.   Labwork: None Ordered   Testing/Procedures: None Ordered   Follow-Up: Your physician wants you to follow-up in: 6 months with Dr. Acie Fredrickson.  You will receive a reminder letter in the mail two months in advance. If you don't receive a letter, please call our office to schedule the follow-up appointment.   If you need a refill on your cardiac medications before your next appointment, please call your pharmacy.   Thank you for choosing [facility]! Christen Bame, RN [phone]

## 2015-10-23 DIAGNOSIS — H35373 Puckering of macula, bilateral: Secondary | ICD-10-CM | POA: Diagnosis not present

## 2015-10-23 DIAGNOSIS — H353122 Nonexudative age-related macular degeneration, left eye, intermediate dry stage: Secondary | ICD-10-CM | POA: Diagnosis not present

## 2015-10-23 DIAGNOSIS — H353112 Nonexudative age-related macular degeneration, right eye, intermediate dry stage: Secondary | ICD-10-CM | POA: Diagnosis not present

## 2015-10-23 DIAGNOSIS — H35343 Macular cyst, hole, or pseudohole, bilateral: Secondary | ICD-10-CM | POA: Diagnosis not present

## 2015-11-01 DIAGNOSIS — H903 Sensorineural hearing loss, bilateral: Secondary | ICD-10-CM | POA: Diagnosis not present

## 2015-11-01 DIAGNOSIS — R42 Dizziness and giddiness: Secondary | ICD-10-CM | POA: Diagnosis not present

## 2015-11-12 ENCOUNTER — Ambulatory Visit: Payer: Medicare Other | Admitting: Cardiovascular Disease

## 2015-12-14 ENCOUNTER — Telehealth: Payer: Self-pay | Admitting: Cardiovascular Disease

## 2015-12-14 NOTE — Telephone Encounter (Signed)
°  Patient calling the office for samples of medication:   1.  What medication and dosage are you requesting samples for? Xarelto 15mg   2.  Are you currently out of this medication? no

## 2015-12-14 NOTE — Telephone Encounter (Signed)
Called pt to inform her that I was leaving samples of Xarelto 15 mg tablet, three bottles, at the front desk for pt to pick up and if she has any other problems, questions or concerns to call the office. Pt verbalized understanding.

## 2016-01-07 ENCOUNTER — Telehealth: Payer: Self-pay | Admitting: Cardiovascular Disease

## 2016-01-07 NOTE — Telephone Encounter (Signed)
New Message  Patient calling the office for samples of medication:   1.  What medication and dosage are you requesting samples for? Xarelto 5mg   2.  Are you currently out of this medication? Per pt yes

## 2016-01-08 NOTE — Telephone Encounter (Signed)
3 bottles of Xarelto 15 mg left for patient. Have also given her an application for patient assistance from [employer].

## 2016-02-01 ENCOUNTER — Telehealth: Payer: Self-pay | Admitting: *Deleted

## 2016-02-01 NOTE — Telephone Encounter (Signed)
Calling requesting some samples of Xarelto.  States she is in donut hole.  Samples of Xarelto 15 mg left at front desk.

## 2016-02-14 ENCOUNTER — Telehealth: Payer: Self-pay | Admitting: Cardiovascular Disease

## 2016-02-14 NOTE — Telephone Encounter (Signed)
Called pt to inform her that I was leaving 3 weeks of samples of Xarelto 15 mg tablets at the front desk for pt to pick up. Xarelto 15 mg Exp: 01-20, LOT # [id number], also forms for pt to fill out to try and get some assistant with being able to get medication cheaper. I advised the pt that if she has any other problems, questions or concerns to call the office. Pt verbalized understanding.

## 2016-02-14 NOTE — Telephone Encounter (Signed)
New message ° ° ° ° ° °Patient calling the office for samples of medication: ° ° °1.  What medication and dosage are you requesting samples for?  xarelto 15mg ° °2.  Are you currently out of this medication?  Almost out ° ° °

## 2016-03-12 DIAGNOSIS — E782 Mixed hyperlipidemia: Secondary | ICD-10-CM | POA: Diagnosis not present

## 2016-03-12 DIAGNOSIS — I10 Essential (primary) hypertension: Secondary | ICD-10-CM | POA: Diagnosis not present

## 2016-03-12 DIAGNOSIS — I481 Persistent atrial fibrillation: Secondary | ICD-10-CM | POA: Diagnosis not present

## 2016-03-12 DIAGNOSIS — R42 Dizziness and giddiness: Secondary | ICD-10-CM | POA: Diagnosis not present

## 2016-03-12 DIAGNOSIS — E11319 Type 2 diabetes mellitus with unspecified diabetic retinopathy without macular edema: Secondary | ICD-10-CM | POA: Diagnosis not present

## 2016-03-12 DIAGNOSIS — E039 Hypothyroidism, unspecified: Secondary | ICD-10-CM | POA: Diagnosis not present

## 2016-03-12 DIAGNOSIS — F411 Generalized anxiety disorder: Secondary | ICD-10-CM | POA: Diagnosis not present

## 2016-04-04 ENCOUNTER — Encounter: Payer: Self-pay | Admitting: Cardiovascular Disease

## 2016-04-07 ENCOUNTER — Encounter: Payer: Self-pay | Admitting: Cardiovascular Disease

## 2016-04-07 ENCOUNTER — Ambulatory Visit (INDEPENDENT_AMBULATORY_CARE_PROVIDER_SITE_OTHER): Payer: Medicare Other | Admitting: Cardiovascular Disease

## 2016-04-07 VITALS — BP 135/70 | HR 99 | Ht 62.0 in | Wt 167.1 lb

## 2016-04-07 DIAGNOSIS — I119 Hypertensive heart disease without heart failure: Secondary | ICD-10-CM | POA: Diagnosis not present

## 2016-04-07 DIAGNOSIS — I639 Cerebral infarction, unspecified: Secondary | ICD-10-CM | POA: Diagnosis not present

## 2016-04-07 DIAGNOSIS — I482 Chronic atrial fibrillation, unspecified: Secondary | ICD-10-CM

## 2016-04-07 NOTE — Patient Instructions (Signed)

## 2016-04-07 NOTE — Progress Notes (Signed)
Cardiology Office Note   Date:  04/07/2016   ID:  Amanda [name], DOB 01-05-1919, MRN [id number]  PCP:  Mayra Neer, MD  Cardiologist: Darlin Coco MD  Chief Complaint  Patient presents with  . Atrial Fibrillation   Problem List 1. Chronic atrial fib 2. CVA 3. Diabetes mellitus 4. Essential HTN 5. Hypothyroidism     Amanda [name] is a 82 y.o. female who presents for a scheduled follow-up visit.  This pleasant 82 year old lady is seen for a scheduled followup office visit. She has a history of established atrial fibrillation and is on long-term anticoagulation.. She does not have any history of ischemic heart disease. She had a normal nuclear stress test in 2006. She had an echocardiogram in 2009 which showed mild left atrial enlargement. The patient has a history of essential hypertension and diabetes and hypothyroidism. Since last visit she has been feeling well. She continues to live by herself in her own home. She still drives herself to her appointments and to the grocery store.  The patient had a recent hospital admission for 2 strokes which left her with initial difficulty with speaking.Her speech has subsequently returned to normal. The patient is now on Xarelto rather than warfarin. She denies any new cardiac symptoms. No chest pain. No shortness of breath. No dizziness or syncope. No awareness of rapid palpitations. She still drives and lives by herself.  She has occasional episodes of dizziness.  No syncope.  She has not been having any chest pain or increased shortness of breath.  She has not had any recurrent episodes of stroke or TIA  Oct 10, 2015:  Amanda [name] is seen for the 1st time - transfer from [place].   Seen with daughter in law - [name] .  Was hospitalized several weeks ago with vertigo  Has chronic A-fib - is on Xarelto 15 mg a day - we reduced the dose recently   Still lives alone,    Gets help with grocery shopping .     Nov. 20, 2017:  Amanda [name] is seen back to day for her atrial fib. BP is a little elevated.   Has been under some stress.  Is not driving anymore.     Past Medical History:  Diagnosis Date  . Carpal tunnel syndrome   . Chronic anticoagulation   . Chronic atrial fibrillation ([place])   . Diabetes mellitus    NON-INSULIN  . Hypothyroidism   . Sinus congestion     Past Surgical History:  Procedure Laterality Date  . APPENDECTOMY    . CARDIOVASCULAR STRESS TEST  07/10/2004   EF 81%  . CHOLECYSTECTOMY    . US ECHOCARDIOGRAPHY  12/06/2007   EF 55-60%     Current Outpatient Prescriptions  Medication Sig Dispense Refill  . Arnica 20 % TINC Apply 1 application topically daily as needed (bruises, soreness).    . diphenhydramine-acetaminophen (TYLENOL PM) 25-500 MG TABS tablet Take 1 tablet by mouth at bedtime as needed (sleep).    Marland Kitchen glimepiride (AMARYL) 1 MG tablet Take 1.5 mg by mouth daily with breakfast.     . levothyroxine (SYNTHROID, LEVOTHROID) 88 MCG tablet Take 88 mcg by mouth daily.      Marland Kitchen lisinopril (PRINIVIL,ZESTRIL) 10 MG tablet Take 10 mg by mouth daily.      . meclizine (ANTIVERT) 25 MG tablet Take one every 6-8 hours as needed for dizziness 20 tablet 0  . Multiple Vitamins-Minerals (PRESERVISION AREDS 2 PO) Take 1 capsule by  mouth 2 (two) times daily.     . Omega-3 Fatty Acids (FISH OIL) 1000 MG CAPS Take 1 capsule by mouth every morning.    . ondansetron (ZOFRAN ODT) 4 MG disintegrating tablet 4mg  ODT q4 hours prn nausea/vomit (Patient taking differently: Take 4 mg by mouth every 4 (four) hours as needed for nausea or vomiting. ) 12 tablet 1  . Polyethyl Glycol-Propyl Glycol 0.4-0.3 % SOLN Place 1 drop into both eyes daily as needed (DRY EYES).     . Probiotic Product (PROBIOTIC DAILY PO) Take 2 tablets by mouth daily as needed (DIGESTION).     . Rivaroxaban (XARELTO) 15 MG TABS tablet Take 1 tablet (15 mg total) by mouth daily with supper. 30 tablet 11  . sodium  chloride (OCEAN) 0.65 % SOLN nasal spray Place 1 spray into both nostrils 2 (two) times daily.      No current facility-administered medications for this visit.     Allergies:   Other; Codeine; Hydrocodone; Metoprolol; Rythmol [propafenone hcl]; and Sulfa antibiotics    Social History:  The patient  reports that she has never smoked. She has never used smokeless tobacco. She reports that she does not drink alcohol or use drugs.   Family History:  The patient's family history includes Diabetes in her sister; Hypertension in her mother; Stroke in her mother.    ROS:  Please see the history of present illness.   Otherwise, review of systems are positive for none.   All other systems are reviewed and negative.    PHYSICAL EXAM: VS:  BP 135/70 (BP Location: Right Arm, Cuff Size: Large)   Pulse 99   Ht 5\' 2"  (1.575 m)   Wt 167 lb 1.9 oz (75.8 kg)   SpO2 94%   BMI 30.57 kg/m  , BMI Body mass index is 30.57 kg/m. GEN: Well nourished, well developed, in no acute distress  HEENT: normal  Neck: no JVD, carotid bruits, or masses Cardiac: Irregularly irregular rhythm.  There is a midsystolic click.No, rubs, or gallops,no edema  Respiratory:  clear to auscultation bilaterally, normal work of breathing GI: soft, nontender, nondistended, + BS Amanda: no deformity or atrophy  Skin: warm and dry, no rash Neuro:  Strength and sensation are intact Psych: euthymic mood, full affect   EKG:  EKG is ordered today. The ekg ordered today demonstrates atrial fibrillation with controlled ventricular response at 74 bpm   Recent Labs: 09/28/2015: ALT 11; TSH 1.571 09/29/2015: BUN 18; Creatinine, Ser 0.96; Hemoglobin 10.6; Platelets 147; Potassium 4.1; Sodium 141    Lipid Panel    Component Value Date/Time   CHOL 189 05/24/2014 0534   TRIG 147 05/24/2014 0534   HDL 47 05/24/2014 0534   CHOLHDL 4.0 05/24/2014 0534   VLDL 29 05/24/2014 0534   LDLCALC 113 (H) 05/24/2014 0534   LDLDIRECT 137.0  01/06/2012 1211      Wt Readings from Last 3 Encounters:  04/07/16 167 lb 1.9 oz (75.8 kg)  10/10/15 166 lb 6.4 oz (75.5 kg)  09/28/15 167 lb 8.8 oz (76 kg)        ASSESSMENT AND PLAN:  1. Permanent atrial fibrillation- Doing well, rate is well controlled.  Continue Xarelto 15 mg a day .   Has rare nose bleeds.   2. essential hypertension without congestive heart failure Stable   3. Hypercholesterolemia  4. Hypothyroidism  5. Hx  Of stroke felt to be embolic occurring at a time that her INR was 1.9. She is now  on Xarelto instead of warfarin.  Will see her in 6 months   Current medicines are reviewed at length with the patient today.  The patient does not have concerns regarding medicines.  The following changes have been made:  no change  Labs/ tests ordered today include:   No orders of the defined types were placed in this encounter.    Disposition: No change in meds     Mertie Moores, MD  04/07/2016 11:03 AM    [address],  [place], [place]  [address] Pager [phone] Phone: [phone]; Fax: [phone]

## 2016-04-23 DIAGNOSIS — H353121 Nonexudative age-related macular degeneration, left eye, early dry stage: Secondary | ICD-10-CM | POA: Diagnosis not present

## 2016-04-23 DIAGNOSIS — E113291 Type 2 diabetes mellitus with mild nonproliferative diabetic retinopathy without macular edema, right eye: Secondary | ICD-10-CM | POA: Diagnosis not present

## 2016-04-23 DIAGNOSIS — H353111 Nonexudative age-related macular degeneration, right eye, early dry stage: Secondary | ICD-10-CM | POA: Diagnosis not present

## 2016-04-23 DIAGNOSIS — E113292 Type 2 diabetes mellitus with mild nonproliferative diabetic retinopathy without macular edema, left eye: Secondary | ICD-10-CM | POA: Diagnosis not present

## 2016-05-02 ENCOUNTER — Telehealth: Payer: Self-pay | Admitting: *Deleted

## 2016-05-02 NOTE — Telephone Encounter (Signed)
Calling requesting Xarelto 15 mg.  Left 2 bottles of Xarelto 15 mg at front desk for her son to pick up.

## 2016-09-02 ENCOUNTER — Telehealth: Payer: Self-pay | Admitting: Cardiovascular Disease

## 2016-09-02 NOTE — Telephone Encounter (Addendum)
called back about Xarelto, she has been given PA forms & refuses to turn them back in, so i have discussed the fact that we HAVE to do one or the other as we can not contiune samples (her in co-pay is $35 dollars) she was said "I DO NOT want to pay that amount of money at all and that she depends on samples only, I do not want to get into the donut hole, so  I dont want to pay the co-pay" I explained to her that I would send a message to [place] we can look into the forms & ships etc.. She isn't to happy with this but is willing to talk to [place]. Pt in agreement with that plan.

## 2016-09-02 NOTE — Telephone Encounter (Signed)
New message ° ° ° ° ° °Patient calling the office for samples of medication: ° ° °1.  What medication and dosage are you requesting samples for?  xarelto 15mg ° °2.  Are you currently out of this medication?  Almost out ° ° °

## 2016-09-02 NOTE — Telephone Encounter (Signed)
Telephone   01/07/2016 [facility]  Thayer Headings, MD  Cardiology   Conversation  (Newest Message First)  January 08, 2016  Brett Canales, LPN      9:29 AM  Note    3 bottles of Xarelto 15 mg left for patient. Have also given her an application for patient assistance from [employer].    Me  to Brett Canales, LPN       2:44 AM  Hey LR!!  This pt just got samples back in July, can you check with her about PA & leave her some samples with a PA form up front??? Thanks!!    Telephone   02/14/2016 [place] J Nahser, MD  Cardiology   Conversation  (Newest Message First)  Derl Barrow      02/14/16 10:22 AM  Note    Called pt to inform her that I was leaving 3 weeks of samples of Xarelto 15 mg tablets at the front desk for pt to pick up. Xarelto 15 mg Exp: 01-20, LOT # [id number], also forms for pt to fill out to try and get some assistant with being able to get medication cheaper. I advised the pt that if she has any other problems, questions or concerns to call the office. Pt verbalized understanding.     Telephone   05/02/2016 [facility]  Hetty Blend, RN   Conversation  (Newest Message First)  Hetty Blend, RN      05/02/16 4:06 PM  Note    Calling requesting Xarelto 15 mg.  Left 2 bottles of Xarelto 15 mg at front desk for her son to pick up.

## 2016-09-03 NOTE — Telephone Encounter (Signed)
Spoke with patient to discuss her need for an office visit to discuss Xarelto. Patient states she must have a Wednesday morning appointment because she does not drive anymore and she has to have her children bring her. She asked for an appointment on May 2. I advised patient to bring PA forms for Xarelto with her and again explained why we are unable to give her enough samples of Xarelto every month. We discussed alternatives to Xarelto and she is agrees that Xarelto seems like the best choice for her. She verbalized understanding and agreement with plan and thanked me for the call.

## 2016-09-06 ENCOUNTER — Emergency Department (HOSPITAL_COMMUNITY): Payer: Medicare Other

## 2016-09-06 ENCOUNTER — Emergency Department (HOSPITAL_COMMUNITY)
Admission: EM | Admit: 2016-09-06 | Discharge: 2016-09-07 | Disposition: A | Discharge status: Home / Self Care | Payer: Medicare Other | Attending: Emergency Medicine | Admitting: Emergency Medicine

## 2016-09-06 ENCOUNTER — Encounter (HOSPITAL_COMMUNITY): Payer: Self-pay | Admitting: Emergency Medicine

## 2016-09-06 DIAGNOSIS — Y9389 Activity, other specified: Secondary | ICD-10-CM | POA: Insufficient documentation

## 2016-09-06 DIAGNOSIS — Z79899 Other long term (current) drug therapy: Secondary | ICD-10-CM | POA: Diagnosis not present

## 2016-09-06 DIAGNOSIS — Y999 Unspecified external cause status: Secondary | ICD-10-CM | POA: Insufficient documentation

## 2016-09-06 DIAGNOSIS — E119 Type 2 diabetes mellitus without complications: Secondary | ICD-10-CM | POA: Insufficient documentation

## 2016-09-06 DIAGNOSIS — M25561 Pain in right knee: Secondary | ICD-10-CM

## 2016-09-06 DIAGNOSIS — Z7984 Long term (current) use of oral hypoglycemic drugs: Secondary | ICD-10-CM | POA: Diagnosis not present

## 2016-09-06 DIAGNOSIS — S8002XA Contusion of left knee, initial encounter: Secondary | ICD-10-CM | POA: Insufficient documentation

## 2016-09-06 DIAGNOSIS — S8001XA Contusion of right knee, initial encounter: Secondary | ICD-10-CM | POA: Diagnosis not present

## 2016-09-06 DIAGNOSIS — M25562 Pain in left knee: Secondary | ICD-10-CM | POA: Diagnosis not present

## 2016-09-06 DIAGNOSIS — W0110XA Fall on same level from slipping, tripping and stumbling with subsequent striking against unspecified object, initial encounter: Secondary | ICD-10-CM | POA: Diagnosis not present

## 2016-09-06 DIAGNOSIS — Z7901 Long term (current) use of anticoagulants: Secondary | ICD-10-CM | POA: Diagnosis not present

## 2016-09-06 DIAGNOSIS — S8991XA Unspecified injury of right lower leg, initial encounter: Secondary | ICD-10-CM | POA: Diagnosis present

## 2016-09-06 DIAGNOSIS — E039 Hypothyroidism, unspecified: Secondary | ICD-10-CM | POA: Diagnosis not present

## 2016-09-06 DIAGNOSIS — Y9289 Other specified places as the place of occurrence of the external cause: Secondary | ICD-10-CM | POA: Diagnosis not present

## 2016-09-06 DIAGNOSIS — Z8673 Personal history of transient ischemic attack (TIA), and cerebral infarction without residual deficits: Secondary | ICD-10-CM | POA: Insufficient documentation

## 2016-09-06 DIAGNOSIS — T148XXA Other injury of unspecified body region, initial encounter: Secondary | ICD-10-CM | POA: Diagnosis not present

## 2016-09-06 DIAGNOSIS — W19XXXA Unspecified fall, initial encounter: Secondary | ICD-10-CM

## 2016-09-06 DIAGNOSIS — I119 Hypertensive heart disease without heart failure: Secondary | ICD-10-CM | POA: Insufficient documentation

## 2016-09-06 DIAGNOSIS — M7989 Other specified soft tissue disorders: Secondary | ICD-10-CM | POA: Diagnosis not present

## 2016-09-06 MED ORDER — TRAMADOL HCL 50 MG PO TABS
50.0000 mg | ORAL_TABLET | Freq: Once | ORAL | Status: AC
  Administered 2016-09-06: 50 mg via ORAL
  Filled 2016-09-06: qty 1

## 2016-09-06 MED ORDER — TRAMADOL HCL 50 MG PO TABS: 50.0000 mg | ORAL_TABLET | Freq: Four times a day (QID) | ORAL | 0 refills | Status: DC | PRN

## 2016-09-06 NOTE — ED Provider Notes (Signed)
[facility] DEPT Provider Note   CSN: [id number] Arrival date & time: 09/06/16  1923     History   Chief Complaint Chief Complaint  Patient presents with  . Fall  . Knee Pain    HPI Amanda [name] is a 83 y.o. female.  The history is provided by the patient and medical records.    83 y.o. F with hx of carpal tunnel, AFIB on xarelto, DM, hypothyroidism, presenting to the ED after a fall that occurred last night around 2330.  Patient states she was getting ready for bed and took off her vest and noticed a bug in the floor.  States she grabbed a paper towel to place over it and she stepped on the bug.  States bug continued to move so she tried to chase it but slipped and fell landing on both knees.  No head injury or LOC.  States she has applied ice to her knees, but continued to have swelling and bruising. States she is having a hard time walking right now because of this.  States left knee is worse.  Denies numbness/weakness of the legs.  She does have cane at home, however does not have to use this regularly.  Past Medical History:  Diagnosis Date  . Carpal tunnel syndrome   . Chronic anticoagulation   . Chronic atrial fibrillation ([place])   . Diabetes mellitus    NON-INSULIN  . Hypothyroidism   . Sinus congestion     Patient Active Problem List   Diagnosis Date Noted  . 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 Anemia 09/28/2015  . Vertigo   . Chronic anticoagulation 05/24/2014  . History of CVA (cerebrovascular accident) 05/23/2014  . Chronic atrial fibrillation ([place]) 05/23/2014  . Diabetes mellitus type 2, controlled ([place]) 05/23/2014  . Slurred speech   . Encounter for therapeutic drug monitoring 08/19/2013  . Benign hypertensive heart disease without heart failure 10/09/2010  . Hypothyroidism 10/09/2010    Past Surgical History:  Procedure Laterality Date  . APPENDECTOMY    . CARDIOVASCULAR STRESS TEST  07/10/2004   EF 81%  . CHOLECYSTECTOMY      . US ECHOCARDIOGRAPHY  12/06/2007   EF 55-60%    OB History    No data available       Home Medications    Prior to Admission medications   Medication Sig Start Date End Date Taking? Authorizing Provider  Arnica 20 % TINC Apply 1 application topically daily as needed (bruises, soreness).    Historical Provider, MD  diphenhydramine-acetaminophen (TYLENOL PM) 25-500 MG TABS tablet Take 1 tablet by mouth at bedtime as needed (sleep).    Historical Provider, MD  glimepiride (AMARYL) 1 MG tablet Take 1.5 mg by mouth daily with breakfast.     Historical Provider, MD  levothyroxine (SYNTHROID, LEVOTHROID) 88 MCG tablet Take 88 mcg by mouth daily.      Historical Provider, MD  lisinopril (PRINIVIL,ZESTRIL) 10 MG tablet Take 10 mg by mouth daily.      Historical Provider, MD  meclizine (ANTIVERT) 25 MG tablet Take one every 6-8 hours as needed for dizziness 09/04/15   Milton Ferguson, MD  Multiple Vitamins-Minerals (PRESERVISION AREDS 2 PO) Take 1 capsule by mouth 2 (two) times daily.     Historical Provider, MD  Omega-3 Fatty Acids (FISH OIL) 1000 MG CAPS Take 1 capsule by mouth every morning.    Historical Provider, MD  ondansetron (ZOFRAN ODT) 4 MG disintegrating tablet 4mg  ODT q4  hours prn nausea/vomit Patient taking differently: Take 4 mg by mouth every 4 (four) hours as needed for nausea or vomiting.  09/04/15   Milton Ferguson, MD  Polyethyl Glycol-Propyl Glycol 0.4-0.3 % SOLN Place 1 drop into both eyes daily as needed (DRY EYES).     Historical Provider, MD  Probiotic Product (PROBIOTIC DAILY PO) Take 2 tablets by mouth daily as needed (DIGESTION).     Historical Provider, MD  Rivaroxaban (XARELTO) 15 MG TABS tablet Take 1 tablet (15 mg total) by mouth daily with supper. 10/10/15   Thayer Headings, MD  sodium chloride (OCEAN) 0.65 % SOLN nasal spray Place 1 spray into both nostrils 2 (two) times daily.     Historical Provider, MD    Family History Family History  Problem Relation Age of  Onset  . Stroke Mother   . Hypertension Mother   . Diabetes Sister   . Hypertension    . Heart attack Neg Hx     Social History Social History  Substance Use Topics  . Smoking status: Never Smoker  . Smokeless tobacco: Never Used  . Alcohol use No     Allergies   Other; Codeine; Hydrocodone; Metoprolol; Rythmol [propafenone hcl]; and Sulfa antibiotics   Review of Systems Review of Systems  Musculoskeletal: Positive for arthralgias and joint swelling.  All other systems reviewed and are negative.    Physical Exam Updated Vital Signs BP (!) 174/77 (BP Location: Right Arm)   Pulse 63   Temp 98.1 F (36.7 C) (Oral)   Resp 18   Ht 5\' 3"  (1.6 m)   Wt 75.8 kg   SpO2 98%   BMI 29.58 kg/m   Physical Exam  Constitutional: She is oriented to person, place, and time. She appears well-developed and well-nourished.  HENT:  Head: Normocephalic and atraumatic.  Mouth/Throat: Oropharynx is clear and moist.  No visible signs of head trauma  Eyes: Conjunctivae and EOM are normal. Pupils are equal, round, and reactive to light.  Neck: Normal range of motion.  Cardiovascular: Normal rate, regular rhythm and normal heart sounds.   Pulmonary/Chest: Effort normal and breath sounds normal. No respiratory distress. She has no wheezes.  Abdominal: Soft. Bowel sounds are normal. There is no tenderness. There is no rebound.  Musculoskeletal: Normal range of motion.  Both knees are swollen and bruised; left > right; pain with palpation and ROM noted; able to range right knee almost fully, left knee is much more limited; DP pulses intact, moving feet/toes normally; normal sensation throughout; no open wounds or lacerations, no tissue crepitus, compartments are soft and compressible tib/fib area non-tender bilaterally  Neurological: She is alert and oriented to person, place, and time.  Skin: Skin is warm and dry.  Psychiatric: She has a normal mood and affect.  Nursing note and vitals  reviewed.    ED Treatments / Results  Labs (all labs ordered are listed, but only abnormal results are displayed) Labs Reviewed - No data to display  EKG  EKG Interpretation None       Radiology Ct Knee Left Wo Contrast  Result Date: 09/06/2016 CLINICAL DATA:  Injury with knee hematoma EXAM: CT OF THE left KNEE WITHOUT CONTRAST TECHNIQUE: Multidetector CT imaging of the left knee was performed according to the standard protocol. Multiplanar CT image reconstructions were also generated. COMPARISON:  Radiographs 09/06/2016 FINDINGS: Bones/Joint/Cartilage No definitive displaced fracture is visualized. Subtle subchondral sclerosis within the medial and lateral femoral condyles. Moderate narrowing of the medial joint  space compartment with prominent bony spurring. Mild marrow of the lateral compartment with bony spurring also present. Moderate patellofemoral narrowing with superior and inferior patellar spurring. Small suprapatellar joint effusion. Punctate calcifications along the anterior and posterior joint. Faint calcifications over the medial joint space. Ligaments Suboptimally assessed by CT. Muscles and Tendons Mild fatty atrophy of the muscles surrounding the knee. Soft tissues Moderate-to-marked subcutaneous edema. Moderate soft tissue thickening and edema anterior to the patella and knee. Hyperdense collection along the medial aspect of the proximal tibia, measuring approximately 8.7 cm oblique AP by 1.8 cm oblique transverse, this is contiguous with a hyperdense collection anteriorly and would be consistent with a hematoma. There are vascular calcifications in the soft tissues. IMPRESSION: 1. Hyperdense mass/fluid collection along the medial aspect of proximal tibia, contiguous with a focal hyperdense collection anteriorly; suspect that the findings are secondary to a soft tissue hematoma. 2. No definitive fracture is visualized 3. Moderate subcutaneous edema with moderate swelling anterior  to the patella and inferior knee. 4. Punctate calcifications within the anterior and posterior joint may reflect tiny loose bodies. Faint calcifications overlying the medial joint space could relate to chondrocalcinosis. Electronically Signed   By: Donavan Foil M.D.   On: 09/06/2016 23:06   Dg Knee Complete 4 Views Left  Result Date: 09/06/2016 CLINICAL DATA:  Bilateral knee pain and swelling following a fall onto the knees. EXAM: LEFT KNEE - COMPLETE 4+ VIEW COMPARISON:  None. FINDINGS: Diffuse subcutaneous edema, most pronounced anteriorly. This is more confluent and oval in shape medial to the proximal tibia, suggesting a soft tissue hematoma, measuring 4.5 x 2.0 cm in the AP projection. No fracture, dislocation or effusion seen. There is mild tricompartmental spur formation. IMPRESSION: 1. No fracture or effusion. 2. Diffuse subcutaneous edema with a probable 4.5 x 2.0 cm soft tissue hematoma medial to the proximal tibia. 3. Mild tricompartmental degenerative changes. Electronically Signed   By: Claudie Revering M.D.   On: 09/06/2016 20:37   Dg Knee Complete 4 Views Right  Result Date: 09/06/2016 CLINICAL DATA:  Bilateral knee pain following a fall onto the knees. EXAM: RIGHT KNEE - COMPLETE 4+ VIEW COMPARISON:  03/08/2008. FINDINGS: Progressive spur formation involving all 3 joint compartments. Patellofemoral joint space narrowing and mild medial and lateral joint space narrowing. Subcutaneous edema, most pronounced anteriorly. No fracture, dislocation or effusion seen. Atheromatous arterial calcifications. IMPRESSION: 1. No fracture or effusion. 2. Progressive tricompartmental degenerative changes. Electronically Signed   By: Claudie Revering M.D.   On: 09/06/2016 20:38    Procedures Procedures (including critical care time)  Medications Ordered in ED Medications - No data to display   Initial Impression / Assessment and Plan / ED Course  I have reviewed the triage vital signs and the nursing  notes.  Pertinent labs & imaging results that were available during my care of the patient were reviewed by me and considered in my medical decision making (see chart for details).  83 y.o. F here with bilateral knee pain after a fall last night.  States she fell while trying to kill a bug in her house.  Fell onto the knees.  No head injury or LOC.  Patient is AAOx3 here.  No signs of head trauma.  Both knees are swollen and bruised, left much larger than right.  Pain with palpation and attempted ROM.  Legs are neurovascularly intact.  No other injuries noted.  Initial x-rays negative.  Given the amount of swelling in the left knee, CT  scan was obtained to assess for possible tibial plateau fracture-- there is a large hematoma and questionable tiny loose bodies in the joint, however no acute fracture noted.  Will place in knee immobilizer for now.  I have offered crutches temporarily, however patient does not feel she can use them but will plan to use her cane tonight.  Her son lives with her, so will be able to assist when needed.  I have written for rolling walker, family will pick this up tomorrow.  Rx tramadol, can continue tylenol.  Elevation and ice encouraged.  Given orthopedic follow-up.  Also recommended to see PCP.  Discussed plan with patient and family, they acknowledged understanding and agreed with plan of care.  Return precautions given for new or worsening symptoms.  Case discussed with attending physician, Dr. Ellender Hose, who evaluated patient and agrees with assessment and plan of care.  Final Clinical Impressions(s) / ED Diagnoses   Final diagnoses:  Fall, initial encounter  Acute pain of both knees    New Prescriptions New Prescriptions   TRAMADOL (ULTRAM) 50 MG TABLET    Take 1 tablet (50 mg total) by mouth every 6 (six) hours as needed.     Larene Pickett, PA-C 09/06/16 2356    Duffy Bruce, MD 09/07/16 [phone]

## 2016-09-06 NOTE — ED Triage Notes (Signed)
Pt arrived EMS from home with c/o knee pain due to a fall she had last night at approx 2300. Pt states she fell on her knees while trying to kill a bug. Pt is on xarelto and presents with swelling to bilateral knees.

## 2016-09-06 NOTE — Discharge Instructions (Signed)
Take the prescribed medication as directed.  Can take this with tylenol.  Continue to ice and elevate knee at home to help with pain/swelling. Follow-up with orthopedics if you continue having worsening symptoms with the knee.  Also recommend to follow-up with your primary care doctor. Return to the ED for new or worsening symptoms.

## 2016-09-07 DIAGNOSIS — S8001XA Contusion of right knee, initial encounter: Secondary | ICD-10-CM | POA: Diagnosis not present

## 2016-09-07 NOTE — Progress Notes (Signed)
Orthopedic Tech Progress Note Patient Details:  Amanda [name] 1919/03/22 [id number]  Ortho Devices Type of Ortho Device: Knee Immobilizer Ortho Device/Splint Location: lle Ortho Device/Splint Interventions: Ordered, Application   Karolee Stamps 09/07/2016, 1:13 AM

## 2016-09-11 ENCOUNTER — Emergency Department (HOSPITAL_COMMUNITY): Payer: Medicare Other

## 2016-09-11 ENCOUNTER — Emergency Department (HOSPITAL_COMMUNITY)
Admission: EM | Admit: 2016-09-11 | Discharge: 2016-09-12 | Disposition: A | Discharge status: Home / Self Care | Payer: Medicare Other | Attending: Emergency Medicine | Admitting: Emergency Medicine

## 2016-09-11 ENCOUNTER — Encounter (HOSPITAL_COMMUNITY): Payer: Self-pay | Admitting: Nurse Practitioner

## 2016-09-11 DIAGNOSIS — E039 Hypothyroidism, unspecified: Secondary | ICD-10-CM | POA: Diagnosis not present

## 2016-09-11 DIAGNOSIS — Z7901 Long term (current) use of anticoagulants: Secondary | ICD-10-CM | POA: Insufficient documentation

## 2016-09-11 DIAGNOSIS — M25462 Effusion, left knee: Secondary | ICD-10-CM | POA: Insufficient documentation

## 2016-09-11 DIAGNOSIS — D649 Anemia, unspecified: Secondary | ICD-10-CM | POA: Diagnosis not present

## 2016-09-11 DIAGNOSIS — M25461 Effusion, right knee: Secondary | ICD-10-CM | POA: Diagnosis not present

## 2016-09-11 DIAGNOSIS — Y929 Unspecified place or not applicable: Secondary | ICD-10-CM | POA: Diagnosis not present

## 2016-09-11 DIAGNOSIS — S8002XA Contusion of left knee, initial encounter: Secondary | ICD-10-CM | POA: Diagnosis not present

## 2016-09-11 DIAGNOSIS — Y999 Unspecified external cause status: Secondary | ICD-10-CM | POA: Diagnosis not present

## 2016-09-11 DIAGNOSIS — Y939 Activity, unspecified: Secondary | ICD-10-CM | POA: Insufficient documentation

## 2016-09-11 DIAGNOSIS — S299XXA Unspecified injury of thorax, initial encounter: Secondary | ICD-10-CM | POA: Diagnosis not present

## 2016-09-11 DIAGNOSIS — S8011XA Contusion of right lower leg, initial encounter: Secondary | ICD-10-CM | POA: Insufficient documentation

## 2016-09-11 DIAGNOSIS — Z8673 Personal history of transient ischemic attack (TIA), and cerebral infarction without residual deficits: Secondary | ICD-10-CM | POA: Diagnosis not present

## 2016-09-11 DIAGNOSIS — S8012XA Contusion of left lower leg, initial encounter: Secondary | ICD-10-CM | POA: Insufficient documentation

## 2016-09-11 DIAGNOSIS — W19XXXA Unspecified fall, initial encounter: Secondary | ICD-10-CM | POA: Diagnosis not present

## 2016-09-11 DIAGNOSIS — R296 Repeated falls: Secondary | ICD-10-CM | POA: Diagnosis not present

## 2016-09-11 DIAGNOSIS — Z7984 Long term (current) use of oral hypoglycemic drugs: Secondary | ICD-10-CM | POA: Insufficient documentation

## 2016-09-11 DIAGNOSIS — M25562 Pain in left knee: Secondary | ICD-10-CM | POA: Diagnosis not present

## 2016-09-11 DIAGNOSIS — E119 Type 2 diabetes mellitus without complications: Secondary | ICD-10-CM | POA: Diagnosis not present

## 2016-09-11 DIAGNOSIS — M25062 Hemarthrosis, left knee: Secondary | ICD-10-CM | POA: Diagnosis not present

## 2016-09-11 DIAGNOSIS — I481 Persistent atrial fibrillation: Secondary | ICD-10-CM | POA: Diagnosis not present

## 2016-09-11 DIAGNOSIS — S8991XA Unspecified injury of right lower leg, initial encounter: Secondary | ICD-10-CM | POA: Diagnosis present

## 2016-09-11 LAB — CBC WITH DIFFERENTIAL/PLATELET
BASOS ABS: 0 10*3/uL (ref 0.0–0.1)
Basophils Relative: 0 %
Eosinophils Absolute: 0.1 10*3/uL (ref 0.0–0.7)
Eosinophils Relative: 1 %
HEMATOCRIT: 28 % — AB (ref 36.0–46.0)
HEMOGLOBIN: 9 g/dL — AB (ref 12.0–15.0)
LYMPHS PCT: 15 %
Lymphs Abs: 1.8 10*3/uL (ref 0.7–4.0)
MCH: 31.3 pg (ref 26.0–34.0)
MCHC: 32.1 g/dL (ref 30.0–36.0)
MCV: 97.2 fL (ref 78.0–100.0)
MONO ABS: 1 10*3/uL (ref 0.1–1.0)
Monocytes Relative: 9 %
NEUTROS ABS: 8.8 10*3/uL — AB (ref 1.7–7.7)
NEUTROS PCT: 75 %
Platelets: 183 10*3/uL (ref 150–400)
RBC: 2.88 MIL/uL — ABNORMAL LOW (ref 3.87–5.11)
RDW: 14.4 % (ref 11.5–15.5)
WBC: 11.7 10*3/uL — ABNORMAL HIGH (ref 4.0–10.5)

## 2016-09-11 LAB — URINALYSIS, ROUTINE W REFLEX MICROSCOPIC
BILIRUBIN URINE: NEGATIVE
GLUCOSE, UA: NEGATIVE mg/dL
HGB URINE DIPSTICK: NEGATIVE
KETONES UR: 20 mg/dL — AB
Leukocytes, UA: NEGATIVE
Nitrite: NEGATIVE
Protein, ur: NEGATIVE mg/dL
Specific Gravity, Urine: 1.015 (ref 1.005–1.030)
pH: 6 (ref 5.0–8.0)

## 2016-09-11 LAB — COMPREHENSIVE METABOLIC PANEL
ALBUMIN: 3.4 g/dL — AB (ref 3.5–5.0)
ALK PHOS: 82 U/L (ref 38–126)
ALT: 17 U/L (ref 14–54)
AST: 24 U/L (ref 15–41)
Anion gap: 10 (ref 5–15)
BILIRUBIN TOTAL: 1.5 mg/dL — AB (ref 0.3–1.2)
BUN: 15 mg/dL (ref 6–20)
CALCIUM: 8.9 mg/dL (ref 8.9–10.3)
CO2: 25 mmol/L (ref 22–32)
Chloride: 100 mmol/L — ABNORMAL LOW (ref 101–111)
Creatinine, Ser: 0.95 mg/dL (ref 0.44–1.00)
GFR calc Af Amer: 57 mL/min — ABNORMAL LOW (ref >60)
GFR, EST NON AFRICAN AMERICAN: 49 mL/min — AB (ref >60)
GLUCOSE: 184 mg/dL — AB (ref 65–99)
Potassium: 4.6 mmol/L (ref 3.5–5.1)
Sodium: 135 mmol/L (ref 135–145)
TOTAL PROTEIN: 6.3 g/dL — AB (ref 6.5–8.1)

## 2016-09-11 NOTE — ED Provider Notes (Signed)
[facility] DEPT Provider Note   CSN: [id number] Arrival date & time: 09/11/16  1716     History   Chief Complaint Chief Complaint  Patient presents with  . Foot Injury    HPI Amanda [name] is a 83 y.o. female.  Patient with h/o afib on Xarelto presents with c/o LLE hematoma. Patient fell 6 days ago and was seen in emergency department 5 days ago. She had a CT at that time of her left knee which demonstrated hematoma. Patient has been very unsteady and has had poor mobility since that time. Patient went to her primary care physician today who was concerned about compartment syndrome (per family). She was then referred to her orthopedic physician who referred to the emergency department for possible admission for rehabilitation placement. Patient was told that her hemoglobin was down slightly. She has had no leg numbness or syncope. Denies other medical complaints. Last dose of Xarelto was yesterday. The onset of this condition was acute. The course is constant. Aggravating factors: none. Alleviating factors: none.        Past Medical History:  Diagnosis Date  . Carpal tunnel syndrome   . Chronic anticoagulation   . Chronic atrial fibrillation ([place])   . Diabetes mellitus    NON-INSULIN  . Hypothyroidism   . Sinus congestion     Patient Active Problem List   Diagnosis Date Noted  . 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 Anemia 09/28/2015  . Vertigo   . Chronic anticoagulation 05/24/2014  . History of CVA (cerebrovascular accident) 05/23/2014  . Chronic atrial fibrillation ([place]) 05/23/2014  . Diabetes mellitus type 2, controlled ([place]) 05/23/2014  . Slurred speech   . Encounter for therapeutic drug monitoring 08/19/2013  . Benign hypertensive heart disease without heart failure 10/09/2010  . Hypothyroidism 10/09/2010    Past Surgical History:  Procedure Laterality Date  . APPENDECTOMY    . CARDIOVASCULAR STRESS TEST  07/10/2004   EF 81%  .  CHOLECYSTECTOMY    . US ECHOCARDIOGRAPHY  12/06/2007   EF 55-60%    OB History    No data available       Home Medications    Prior to Admission medications   Medication Sig Start Date End Date Taking? Authorizing Provider  glimepiride (AMARYL) 1 MG tablet Take 1.5 mg by mouth daily with breakfast.     Historical Provider, MD  levothyroxine (SYNTHROID, LEVOTHROID) 88 MCG tablet Take 88 mcg by mouth daily.      Historical Provider, MD  lisinopril (PRINIVIL,ZESTRIL) 10 MG tablet Take 10 mg by mouth daily.      Historical Provider, MD  meclizine (ANTIVERT) 25 MG tablet Take one every 6-8 hours as needed for dizziness Patient not taking: Reported on 09/06/2016 09/04/15   Milton Ferguson, MD  Multiple Vitamins-Minerals (PRESERVISION AREDS 2 PO) Take 1 capsule by mouth 2 (two) times daily.     Historical Provider, MD  Omega-3 Fatty Acids (FISH OIL) 1000 MG CAPS Take 1 capsule by mouth every morning.    Historical Provider, MD  ondansetron (ZOFRAN ODT) 4 MG disintegrating tablet 4mg  ODT q4 hours prn nausea/vomit Patient not taking: Reported on 09/06/2016 09/04/15   Milton Ferguson, MD  Probiotic Product (PROBIOTIC DAILY PO) Take 2 tablets by mouth daily as needed (DIGESTION).     Historical Provider, MD  Rivaroxaban (XARELTO) 15 MG TABS tablet Take 1 tablet (15 mg total) by mouth daily with supper. 10/10/15   Thayer Headings, MD  traMADol (ULTRAM) 50 MG tablet Take 1 tablet (50 mg total) by mouth every 6 (six) hours as needed. 09/06/16   Larene Pickett, PA-C    Family History Family History  Problem Relation Age of Onset  . Stroke Mother   . Hypertension Mother   . Diabetes Sister   . Hypertension    . Heart attack Neg Hx     Social History Social History  Substance Use Topics  . Smoking status: Never Smoker  . Smokeless tobacco: Never Used  . Alcohol use No     Allergies   Other; Codeine; Hydrocodone; Metoprolol; Rythmol [propafenone hcl]; and Sulfa antibiotics   Review of  Systems Review of Systems  Constitutional: Negative for fever.  HENT: Negative for rhinorrhea and sore throat.   Eyes: Negative for redness.  Respiratory: Negative for cough.   Cardiovascular: Negative for chest pain.  Gastrointestinal: Negative for abdominal pain, diarrhea, nausea and vomiting.  Genitourinary: Negative for dysuria.  Musculoskeletal: Positive for arthralgias, gait problem, joint swelling and myalgias.  Skin: Positive for color change. Negative for rash.  Neurological: Negative for headaches.     Physical Exam Updated Vital Signs BP (!) 164/71   Pulse 86   Temp 98.8 F (37.1 C) (Oral)   Resp 18   SpO2 90%   Physical Exam  Constitutional: She appears well-developed and well-nourished.  HENT:  Head: Normocephalic and atraumatic.  Mouth/Throat: Oropharynx is clear and moist.  Eyes: Conjunctivae are normal. Right eye exhibits no discharge. Left eye exhibits no discharge.  Neck: Normal range of motion. Neck supple.  Cardiovascular: Normal rate and normal heart sounds.  An irregularly irregular rhythm present.  No murmur heard. Pulmonary/Chest: Effort normal and breath sounds normal. No respiratory distress. She has no wheezes. She has no rales. She exhibits no tenderness.  Abdominal: Soft. There is no tenderness.  Musculoskeletal:       Left hip: Normal.       Left knee: She exhibits decreased range of motion and effusion. Tenderness found.       Left ankle: She exhibits swelling and ecchymosis.       Left upper leg: Normal.       Left lower leg: She exhibits tenderness, swelling and edema.       Legs:      Left foot: There is normal range of motion.  Neurological: She is alert.  Skin: Skin is warm and dry.  Psychiatric: She has a normal mood and affect.  Nursing note and vitals reviewed.    ED Treatments / Results  Labs (all labs ordered are listed, but only abnormal results are displayed) Labs Reviewed  CBC WITH DIFFERENTIAL/PLATELET - Abnormal;  Notable for the following:       Result Value   WBC 11.7 (*)    RBC 2.88 (*)    Hemoglobin 9.0 (*)    HCT 28.0 (*)    Neutro Abs 8.8 (*)    All other components within normal limits  COMPREHENSIVE METABOLIC PANEL - Abnormal; Notable for the following:    Chloride 100 (*)    Glucose, Bld 184 (*)    Total Protein 6.3 (*)    Albumin 3.4 (*)    Total Bilirubin 1.5 (*)    GFR calc non Af Amer 49 (*)    GFR calc Af Amer 57 (*)    All other components within normal limits  URINALYSIS, ROUTINE W REFLEX MICROSCOPIC - Abnormal; Notable for the following:    Ketones, ur  20 (*)    All other components within normal limits    Radiology Dg Chest 2 View  Result Date: 09/11/2016 CLINICAL DATA:  83 year old female with fall. EXAM: CHEST  2 VIEW COMPARISON:  Chest radiograph dated 09/04/2015 FINDINGS: The lungs are clear. There is no pleural effusion or pneumothorax. Stable mild cardiomegaly. There is atherosclerotic calcification of the aortic arch. There is osteopenia with degenerative changes of the spine and AC joints. No acute fracture. IMPRESSION: No active cardiopulmonary disease. Electronically Signed   By: Anner Crete M.D.   On: 09/11/2016 22:24    Procedures Procedures (including critical care time)   Initial Impression / Assessment and Plan / ED Course  I have reviewed the triage vital signs and the nursing notes.  Pertinent labs & imaging results that were available during my care of the patient were reviewed by me and considered in my medical decision making (see chart for details).     Patient seen and examined. Work-up initiated.   Vital signs reviewed and are as follows: BP (!) 185/73   Pulse 85   Temp 98.8 F (37.1 C) (Oral)   Resp 17   SpO2 (!) 77%   Discussed with Dr. Roderic Palau who will see. Will ask social work to see.   9:20 PM SW consult ongoing.   11:12 PM case manager has also seen. Reviewed lab and x-ray findings with patient and family. Unfortunately,  there are no acute medical indications which would necessitate admission at this point. Plan is to maximize patient's home health, and orders placed tonight with assistance of case manager.  Patient will discontinue Xarelto, until the time it is deemed safe to resume this. Patient to follow-up with her cardiologist and PCP to discuss benefits of anticoagulation versus her fall risk.  I spoke with Jerilynn Mages PA-C of [facility] to close loop and ask that Dr. Delilah Shan be notified of plan and to facilitate appropriate follow-up.   Family updated and questions answered. They are ready for discharge.   Final Clinical Impressions(s) / ED Diagnoses   Final diagnoses:  Effusion of bursa of left knee  Traumatic ecchymosis of left lower leg, initial encounter  Traumatic ecchymosis of right lower leg, initial encounter   Patients with knee effusion, suspect hemarthrosis, significant ecchymosis of bilateral lower extremities, but especially on the left. Calf is soft. Patient has easily palpable pedal pulses bilaterally. No pain, pallor, paresthesias to suggest ischemic foot. Do not suspect compartment syndrome. Patient to stop her anticoagulation. Her hemoglobin is slightly lower than previous baseline likely due to her bleeding and ecchymosis, however she is not symptomatic from this and does not require transfusion at this time. We have made arrangements to maximize home health benefits for patient. At this point we do not have a medical reason to admit. Patient to follow-up as outpatient or return with worsening symptoms.     New Prescriptions New Prescriptions   No medications on file     Carlisle Cater, Hershal Coria 09/11/16 2318    Milton Ferguson, MD 09/12/16 [phone]

## 2016-09-11 NOTE — Discharge Instructions (Signed)
Please read and follow all provided instructions.  Your diagnoses today include:  1. Effusion of bursa of left knee   2. Traumatic ecchymosis of left lower leg, initial encounter   3. Traumatic ecchymosis of right lower leg, initial encounter     Tests performed today include:  Blood counts and electrolytes  Urine test and chest x-ray  Vital signs. See below for your results today.   Medications prescribed:   None  Take any prescribed medications only as directed.  Home care instructions:  Follow any educational materials contained in this packet.  Stop taking your Xarelto until told that it is safe to do so by your primary care physician.   BE VERY CAREFUL not to take multiple medicines containing Tylenol (also called acetaminophen). Doing so can lead to an overdose which can damage your liver and cause liver failure and possibly death.   Follow-up instructions: Please follow-up with your primary care provider in the next 3 days for further evaluation of your symptoms.   Return instructions:   Please return to the Emergency Department if you experience worsening symptoms.   Return if you have lightheadedness, pass out, develop chest pain or shortness of breath.   Please return if you have any other emergent concerns.  Additional Information:  Your vital signs today were: BP 140/83 (BP Location: Left Arm)    Pulse 84    Temp 98.8 F (37.1 C) (Oral)    Resp 15    SpO2 96%  If your blood pressure (BP) was elevated above 135/85 this visit, please have this repeated by your doctor within one month. --------------

## 2016-09-11 NOTE — ED Triage Notes (Signed)
Pt presents with c/o L foot injury. she fell earlier this week injuring her LLE. She has had increasing pain, swelling, bruising to the extremity since. She was sent to ED by her ortho MD today for evaluation of compartment syndrome. Her LLE is bruised and swollen today, she has moderate pain, circulation and sensation is intact

## 2016-09-11 NOTE — Progress Notes (Signed)
CSW spoke with pt and her family re: rehab options.  Explained Medicare 3 midnight rule and SNF placement as well as private pay option for SNF.  Pt states that she is unable to private pay for SNF and does not believe she would qualify for LTC Medicaid at this time.  Discussed home with [facility] and private duty sitters.  Family frustrated about PCP/ortho advising pt to come into the ED for admission for placement.  Emotional support provided.  Discussed with RNCM who will speak with family,

## 2016-09-12 ENCOUNTER — Telehealth: Payer: Self-pay

## 2016-09-12 NOTE — Telephone Encounter (Signed)
CSW was asked to contact Patient's daughter/HCPOA by RN Case Manager Lucia Bitter regarding SNF placement. CSW spoke with Patient's daughter and discussed the process of obtaining an FL2 from the community as Patient was D/C'ed from the ED and an FL2 was not completed. CSW discussed facilities in [facility] and encouraged Patient's daughter to access www.medicare.gov for additional information, including reports from the [facility] facility-specific report cards. CSW also recommended touring the facilities of interest to determine if it is the right fit. Facilities can be contacted directly for private pay cost. CSW emailed list of all local SNF facilities to Patient's daughter.    Lorrine Kin, MSW, LCSW [facility] ED/37M Clinical Social Worker [phone]

## 2016-09-15 ENCOUNTER — Telehealth: Payer: Self-pay | Admitting: Cardiovascular Disease

## 2016-09-15 NOTE — Telephone Encounter (Signed)
Spoke with patient who states her PCP, Dr. Brigitte Pulse, advised her on 4/26 to stop Xarelto due to recent fall. I advised her that per Dr. Acie Fredrickson, he will discuss with her when she comes in for her office visit on 5/2. She verbalized understanding and agreement and thanked me for the call.

## 2016-09-15 NOTE — Telephone Encounter (Signed)
New Message  Pts daughter voiced pt had a fall and took her off blood thinner, pt is suppose to talk to MD-Nahser right away.  Pt has been off for several day.  Please f/u

## 2016-09-16 DIAGNOSIS — I482 Chronic atrial fibrillation: Secondary | ICD-10-CM | POA: Diagnosis not present

## 2016-09-16 DIAGNOSIS — I10 Essential (primary) hypertension: Secondary | ICD-10-CM | POA: Diagnosis not present

## 2016-09-16 DIAGNOSIS — M170 Bilateral primary osteoarthritis of knee: Secondary | ICD-10-CM | POA: Diagnosis not present

## 2016-09-16 DIAGNOSIS — S8002XD Contusion of left knee, subsequent encounter: Secondary | ICD-10-CM | POA: Diagnosis not present

## 2016-09-16 DIAGNOSIS — E119 Type 2 diabetes mellitus without complications: Secondary | ICD-10-CM | POA: Diagnosis not present

## 2016-09-16 DIAGNOSIS — G5600 Carpal tunnel syndrome, unspecified upper limb: Secondary | ICD-10-CM | POA: Diagnosis not present

## 2016-09-17 ENCOUNTER — Encounter (INDEPENDENT_AMBULATORY_CARE_PROVIDER_SITE_OTHER): Payer: Self-pay

## 2016-09-17 ENCOUNTER — Ambulatory Visit (INDEPENDENT_AMBULATORY_CARE_PROVIDER_SITE_OTHER): Payer: Medicare Other | Admitting: Cardiovascular Disease

## 2016-09-17 ENCOUNTER — Encounter: Payer: Self-pay | Admitting: Cardiovascular Disease

## 2016-09-17 VITALS — BP 130/70 | HR 70 | Ht 63.0 in | Wt 167.0 lb

## 2016-09-17 DIAGNOSIS — I482 Chronic atrial fibrillation, unspecified: Secondary | ICD-10-CM

## 2016-09-17 LAB — BASIC METABOLIC PANEL
BUN/Creatinine Ratio: 20 (ref 12–28)
BUN: 17 mg/dL (ref 10–36)
CALCIUM: 8.8 mg/dL (ref 8.7–10.3)
CO2: 23 mmol/L (ref 18–29)
CREATININE: 0.83 mg/dL (ref 0.57–1.00)
Chloride: 97 mmol/L (ref 96–106)
GFR calc Af Amer: 69 mL/min/{1.73_m2} (ref >59)
GFR calc non Af Amer: 60 mL/min/{1.73_m2} (ref >59)
GLUCOSE: 172 mg/dL — AB (ref 65–99)
Potassium: 4.8 mmol/L (ref 3.5–5.2)
Sodium: 136 mmol/L (ref 134–144)

## 2016-09-17 LAB — CBC
HEMOGLOBIN: 10.2 g/dL — AB (ref 11.1–15.9)
Hematocrit: 31.3 % — ABNORMAL LOW (ref 34.0–46.6)
MCH: 32.1 pg (ref 26.6–33.0)
MCHC: 32.6 g/dL (ref 31.5–35.7)
MCV: 98 fL — ABNORMAL HIGH (ref 79–97)
Platelets: 297 10*3/uL (ref 150–379)
RBC: 3.18 x10E6/uL — AB (ref 3.77–5.28)
RDW: 15 % (ref 12.3–15.4)
WBC: 8.8 10*3/uL (ref 3.4–10.8)

## 2016-09-17 NOTE — Progress Notes (Signed)
Cardiology Office Note   Date:  09/17/2016   ID:  Serita Kyle Edmiston, DOB [birth year], MRN [id number]  PCP:  Mayra Neer, MD  Cardiologist: Darlin Coco MD  Chief Complaint  Patient presents with  . Atrial Fibrillation   Problem List 1. Chronic atrial fib 2. CVA 3. Diabetes mellitus 4. Essential HTN 5. Hypothyroidism     Amanda [name] is a 83 y.o. female who presents for a scheduled follow-up visit.  This pleasant 83 year old lady is seen for a scheduled followup office visit. She has a history of established atrial fibrillation and is on long-term anticoagulation.. She does not have any history of ischemic heart disease. She had a normal nuclear stress test in 2006. She had an echocardiogram in 2009 which showed mild left atrial enlargement. The patient has a history of essential hypertension and diabetes and hypothyroidism. Since last visit she has been feeling well. She continues to live by herself in her own home. She still drives herself to her appointments and to the grocery store.  The patient had a recent hospital admission for 2 strokes which left her with initial difficulty with speaking.Her speech has subsequently returned to normal. The patient is now on Xarelto rather than warfarin. She denies any new cardiac symptoms. No chest pain. No shortness of breath. No dizziness or syncope. No awareness of rapid palpitations. She still drives and lives by herself.  She has occasional episodes of dizziness.  No syncope.  She has not been having any chest pain or increased shortness of breath.  She has not had any recurrent episodes of stroke or TIA  Oct 10, 2015:  Ms. Strothman is seen for the 1st time - transfer from [place].   Seen with daughter in law - [name] .  Was hospitalized several weeks ago with vertigo  Has chronic A-fib - is on Xarelto 15 mg a day - we reduced the dose recently   Still lives alone,    Gets help with grocery shopping .    Nov. 20, 2017:  Ms Kenealy is seen back to day for her atrial fib. BP is a little elevated.   Has been under some stress.  Is not driving anymore.  Feb. 2, 2018:  Seen with daughter in law, [name] She fell and injured her knee.   Her medical doctor has stopped her Xarelto until she was seen here today     Past Medical History:  Diagnosis Date  . Carpal tunnel syndrome   . Chronic anticoagulation   . Chronic atrial fibrillation ([place])   . Diabetes mellitus    NON-INSULIN  . Hypothyroidism   . Sinus congestion     Past Surgical History:  Procedure Laterality Date  . APPENDECTOMY    . CARDIOVASCULAR STRESS TEST  07/10/2004   EF 81%  . CHOLECYSTECTOMY    . US ECHOCARDIOGRAPHY  12/06/2007   EF 55-60%     Current Outpatient Prescriptions  Medication Sig Dispense Refill  . acetaminophen (TYLENOL) 500 MG tablet Take 1,000 mg by mouth every 6 (six) hours as needed (for pain).    Marland Kitchen glimepiride (AMARYL) 1 MG tablet Take 1.5 mg by mouth daily with breakfast.     . levothyroxine (SYNTHROID, LEVOTHROID) 88 MCG tablet Take 88 mcg by mouth daily.      Marland Kitchen lisinopril (PRINIVIL,ZESTRIL) 10 MG tablet Take 10 mg by mouth daily.      Marland Kitchen MAGNESIUM PO Take 1 capsule by mouth daily.    . meclizine (ANTIVERT)  25 MG tablet Take one every 6-8 hours as needed for dizziness 20 tablet 0  . Multiple Vitamins-Minerals (PRESERVISION AREDS 2 PO) Take 1 capsule by mouth 2 (two) times daily.     . Omega-3 Fatty Acids (FISH OIL) 1000 MG CAPS Take 1 capsule by mouth every morning.    . ondansetron (ZOFRAN ODT) 4 MG disintegrating tablet 4mg  ODT q4 hours prn nausea/vomit 12 tablet 1  . Probiotic Product (PROBIOTIC DAILY PO) Take 2 tablets by mouth daily as needed (DIGESTION).     Marland Kitchen traMADol (ULTRAM) 50 MG tablet Take 1 tablet (50 mg total) by mouth every 6 (six) hours as needed. 20 tablet 0   No current facility-administered medications for this visit.     Allergies:   Other; Tape; Codeine; Hydrocodone;  Metoprolol; Rythmol [propafenone hcl]; Shellfish allergy; and Sulfa antibiotics    Social History:  The patient  reports that she has never smoked. She has never used smokeless tobacco. She reports that she does not drink alcohol or use drugs.   Family History:  The patient's family history includes Diabetes in her sister; Hypertension in her mother; Stroke in her mother.    ROS:  Please see the history of present illness.   Otherwise, review of systems are positive for none.   All other systems are reviewed and negative.    PHYSICAL EXAM: VS:  BP 130/70   Pulse 70   Ht 5\' 3"  (1.6 m)   Wt 167 lb (75.8 kg)   SpO2 97%   BMI 29.58 kg/m  , BMI Body mass index is 29.58 kg/m. GEN: Well nourished, well developed, in no acute distress  HEENT: normal  Neck: no JVD, carotid bruits, or masses Cardiac: Irregularly irregular rhythm.  There is a midsystolic click.No, rubs, or gallops,no edema  Respiratory:  clear to auscultation bilaterally, normal work of breathing GI: soft, nontender, nondistended, + BS MS:  Bilateral bruising in her knees,  Left knee is red and swollen  Skin: warm and dry, no rash Neuro:  Strength and sensation are intact Psych: euthymic mood, full affect   EKG:  EKG is ordered today. The ekg ordered today demonstrates atrial fibrillation with controlled ventricular response at 74 bpm   Recent Labs: 09/28/2015: TSH 1.571 09/11/2016: ALT 17; BUN 15; Creatinine, Ser 0.95; Hemoglobin 9.0; Platelets 183; Potassium 4.6; Sodium 135    Lipid Panel    Component Value Date/Time   CHOL 189 05/24/2014 0534   TRIG 147 05/24/2014 0534   HDL 47 05/24/2014 0534   CHOLHDL 4.0 05/24/2014 0534   VLDL 29 05/24/2014 0534   LDLCALC 113 (H) 05/24/2014 0534   LDLDIRECT 137.0 01/06/2012 1211      Wt Readings from Last 3 Encounters:  09/17/16 167 lb (75.8 kg)  09/06/16 167 lb (75.8 kg)  04/07/16 167 lb 1.9 oz (75.8 kg)        ASSESSMENT AND PLAN:  1. Permanent atrial  fibrillation- Doing well, rate is well controlled.    Xarelto 15 mg a day but this is on hold following her knee injury. I would like for her to restart the Xarelto as soon as her orthopedic doctor gives the OK I would anticipate restarting it within the month   2. essential hypertension without congestive heart failure Stable   3. Hypercholesterolemia  4. Hypothyroidism  5. Hx  Of stroke felt to be embolic occurring at a time that her INR was 1.9. She is now on Xarelto - currently on hold following  a recent fall   Will see her in 6 months   Current medicines are reviewed at length with the patient today.  The patient does not have concerns regarding medicines.  The following changes have been made:  no change  Labs/ tests ordered today include:   No orders of the defined types were placed in this encounter.    Disposition: No change in meds     Mertie Moores, MD  09/17/2016 11:54 AM    [facility] [address] [place],  [address], [place]  [address] Pager [phone] Phone: [phone]; Fax: [phone]

## 2016-09-17 NOTE — Patient Instructions (Addendum)
Medication Instructions:  Continue to hold Xarelto until approved by orthopedic doctor   Labwork: TODAY - CBC, BMET   Testing/Procedures: None Ordered   Follow-Up: Your physician wants you to follow-up in: 6 months with Dr. Acie Fredrickson.  You will receive a reminder letter in the mail two months in advance. If you don't receive a letter, please call our office to schedule the follow-up appointment.   If you need a refill on your cardiac medications before your next appointment, please call your pharmacy.   Thank you for choosing [facility]! Christen Bame, RN [phone]

## 2016-09-18 ENCOUNTER — Telehealth: Payer: Self-pay | Admitting: Nurse Practitioner

## 2016-09-18 DIAGNOSIS — I482 Chronic atrial fibrillation: Secondary | ICD-10-CM | POA: Diagnosis not present

## 2016-09-18 DIAGNOSIS — M170 Bilateral primary osteoarthritis of knee: Secondary | ICD-10-CM | POA: Diagnosis not present

## 2016-09-18 DIAGNOSIS — I10 Essential (primary) hypertension: Secondary | ICD-10-CM | POA: Diagnosis not present

## 2016-09-18 DIAGNOSIS — G5600 Carpal tunnel syndrome, unspecified upper limb: Secondary | ICD-10-CM | POA: Diagnosis not present

## 2016-09-18 DIAGNOSIS — E119 Type 2 diabetes mellitus without complications: Secondary | ICD-10-CM | POA: Diagnosis not present

## 2016-09-18 DIAGNOSIS — S8002XD Contusion of left knee, subsequent encounter: Secondary | ICD-10-CM | POA: Diagnosis not present

## 2016-09-18 MED ORDER — RIVAROXABAN 15 MG PO TABS: 15.0000 mg | ORAL_TABLET | Freq: Every day | ORAL | 11 refills | Status: DC

## 2016-09-18 NOTE — Telephone Encounter (Signed)
-----   Message from Thayer Headings, MD sent at 09/17/2016  5:26 PM EDT ----- Labs are stable

## 2016-09-18 NOTE — Telephone Encounter (Signed)
Reviewed lab results with patient who verbalized understanding. She states she spoke to Dr. Scarlette Ar nurse, Ishmael Holter, and to Dr. Raul Del office and they both gave approval for her to restart Xarelto. I verified dose of 15 mg daily with patient and Dr. Acie Fredrickson and added it back to patient's list. She requests samples be placed at the front desk for her. She thanked me for the call.

## 2016-09-19 DIAGNOSIS — I10 Essential (primary) hypertension: Secondary | ICD-10-CM | POA: Diagnosis not present

## 2016-09-19 DIAGNOSIS — S8002XD Contusion of left knee, subsequent encounter: Secondary | ICD-10-CM | POA: Diagnosis not present

## 2016-09-19 DIAGNOSIS — E119 Type 2 diabetes mellitus without complications: Secondary | ICD-10-CM | POA: Diagnosis not present

## 2016-09-19 DIAGNOSIS — M170 Bilateral primary osteoarthritis of knee: Secondary | ICD-10-CM | POA: Diagnosis not present

## 2016-09-19 DIAGNOSIS — G5600 Carpal tunnel syndrome, unspecified upper limb: Secondary | ICD-10-CM | POA: Diagnosis not present

## 2016-09-19 DIAGNOSIS — I482 Chronic atrial fibrillation: Secondary | ICD-10-CM | POA: Diagnosis not present

## 2016-09-22 DIAGNOSIS — I10 Essential (primary) hypertension: Secondary | ICD-10-CM | POA: Diagnosis not present

## 2016-09-22 DIAGNOSIS — I482 Chronic atrial fibrillation: Secondary | ICD-10-CM | POA: Diagnosis not present

## 2016-09-22 DIAGNOSIS — E119 Type 2 diabetes mellitus without complications: Secondary | ICD-10-CM | POA: Diagnosis not present

## 2016-09-22 DIAGNOSIS — G5600 Carpal tunnel syndrome, unspecified upper limb: Secondary | ICD-10-CM | POA: Diagnosis not present

## 2016-09-22 DIAGNOSIS — S8002XD Contusion of left knee, subsequent encounter: Secondary | ICD-10-CM | POA: Diagnosis not present

## 2016-09-22 DIAGNOSIS — M170 Bilateral primary osteoarthritis of knee: Secondary | ICD-10-CM | POA: Diagnosis not present

## 2016-09-23 DIAGNOSIS — S8002XD Contusion of left knee, subsequent encounter: Secondary | ICD-10-CM | POA: Diagnosis not present

## 2016-09-23 DIAGNOSIS — M170 Bilateral primary osteoarthritis of knee: Secondary | ICD-10-CM | POA: Diagnosis not present

## 2016-09-23 DIAGNOSIS — I482 Chronic atrial fibrillation: Secondary | ICD-10-CM | POA: Diagnosis not present

## 2016-09-23 DIAGNOSIS — E119 Type 2 diabetes mellitus without complications: Secondary | ICD-10-CM | POA: Diagnosis not present

## 2016-09-23 DIAGNOSIS — I10 Essential (primary) hypertension: Secondary | ICD-10-CM | POA: Diagnosis not present

## 2016-09-23 DIAGNOSIS — G5600 Carpal tunnel syndrome, unspecified upper limb: Secondary | ICD-10-CM | POA: Diagnosis not present

## 2016-09-24 DIAGNOSIS — I482 Chronic atrial fibrillation: Secondary | ICD-10-CM | POA: Diagnosis not present

## 2016-09-24 DIAGNOSIS — E119 Type 2 diabetes mellitus without complications: Secondary | ICD-10-CM | POA: Diagnosis not present

## 2016-09-24 DIAGNOSIS — M170 Bilateral primary osteoarthritis of knee: Secondary | ICD-10-CM | POA: Diagnosis not present

## 2016-09-24 DIAGNOSIS — S8002XD Contusion of left knee, subsequent encounter: Secondary | ICD-10-CM | POA: Diagnosis not present

## 2016-09-24 DIAGNOSIS — I10 Essential (primary) hypertension: Secondary | ICD-10-CM | POA: Diagnosis not present

## 2016-09-24 DIAGNOSIS — G5600 Carpal tunnel syndrome, unspecified upper limb: Secondary | ICD-10-CM | POA: Diagnosis not present

## 2016-09-29 DIAGNOSIS — G5600 Carpal tunnel syndrome, unspecified upper limb: Secondary | ICD-10-CM | POA: Diagnosis not present

## 2016-09-29 DIAGNOSIS — S8002XD Contusion of left knee, subsequent encounter: Secondary | ICD-10-CM | POA: Diagnosis not present

## 2016-09-29 DIAGNOSIS — I482 Chronic atrial fibrillation: Secondary | ICD-10-CM | POA: Diagnosis not present

## 2016-09-29 DIAGNOSIS — E119 Type 2 diabetes mellitus without complications: Secondary | ICD-10-CM | POA: Diagnosis not present

## 2016-09-29 DIAGNOSIS — I10 Essential (primary) hypertension: Secondary | ICD-10-CM | POA: Diagnosis not present

## 2016-09-29 DIAGNOSIS — M170 Bilateral primary osteoarthritis of knee: Secondary | ICD-10-CM | POA: Diagnosis not present

## 2016-09-30 DIAGNOSIS — I10 Essential (primary) hypertension: Secondary | ICD-10-CM | POA: Diagnosis not present

## 2016-09-30 DIAGNOSIS — I482 Chronic atrial fibrillation: Secondary | ICD-10-CM | POA: Diagnosis not present

## 2016-09-30 DIAGNOSIS — G5600 Carpal tunnel syndrome, unspecified upper limb: Secondary | ICD-10-CM | POA: Diagnosis not present

## 2016-09-30 DIAGNOSIS — M170 Bilateral primary osteoarthritis of knee: Secondary | ICD-10-CM | POA: Diagnosis not present

## 2016-09-30 DIAGNOSIS — E119 Type 2 diabetes mellitus without complications: Secondary | ICD-10-CM | POA: Diagnosis not present

## 2016-09-30 DIAGNOSIS — S8002XD Contusion of left knee, subsequent encounter: Secondary | ICD-10-CM | POA: Diagnosis not present

## 2016-10-01 DIAGNOSIS — G5600 Carpal tunnel syndrome, unspecified upper limb: Secondary | ICD-10-CM | POA: Diagnosis not present

## 2016-10-01 DIAGNOSIS — E119 Type 2 diabetes mellitus without complications: Secondary | ICD-10-CM | POA: Diagnosis not present

## 2016-10-01 DIAGNOSIS — I482 Chronic atrial fibrillation: Secondary | ICD-10-CM | POA: Diagnosis not present

## 2016-10-01 DIAGNOSIS — M170 Bilateral primary osteoarthritis of knee: Secondary | ICD-10-CM | POA: Diagnosis not present

## 2016-10-01 DIAGNOSIS — S8002XD Contusion of left knee, subsequent encounter: Secondary | ICD-10-CM | POA: Diagnosis not present

## 2016-10-01 DIAGNOSIS — I10 Essential (primary) hypertension: Secondary | ICD-10-CM | POA: Diagnosis not present

## 2016-10-06 DIAGNOSIS — G5600 Carpal tunnel syndrome, unspecified upper limb: Secondary | ICD-10-CM | POA: Diagnosis not present

## 2016-10-06 DIAGNOSIS — I482 Chronic atrial fibrillation: Secondary | ICD-10-CM | POA: Diagnosis not present

## 2016-10-06 DIAGNOSIS — M170 Bilateral primary osteoarthritis of knee: Secondary | ICD-10-CM | POA: Diagnosis not present

## 2016-10-06 DIAGNOSIS — S8002XD Contusion of left knee, subsequent encounter: Secondary | ICD-10-CM | POA: Diagnosis not present

## 2016-10-06 DIAGNOSIS — I10 Essential (primary) hypertension: Secondary | ICD-10-CM | POA: Diagnosis not present

## 2016-10-06 DIAGNOSIS — E119 Type 2 diabetes mellitus without complications: Secondary | ICD-10-CM | POA: Diagnosis not present

## 2016-10-07 DIAGNOSIS — E119 Type 2 diabetes mellitus without complications: Secondary | ICD-10-CM | POA: Diagnosis not present

## 2016-10-07 DIAGNOSIS — S8002XD Contusion of left knee, subsequent encounter: Secondary | ICD-10-CM | POA: Diagnosis not present

## 2016-10-07 DIAGNOSIS — I482 Chronic atrial fibrillation: Secondary | ICD-10-CM | POA: Diagnosis not present

## 2016-10-07 DIAGNOSIS — G5600 Carpal tunnel syndrome, unspecified upper limb: Secondary | ICD-10-CM | POA: Diagnosis not present

## 2016-10-07 DIAGNOSIS — M170 Bilateral primary osteoarthritis of knee: Secondary | ICD-10-CM | POA: Diagnosis not present

## 2016-10-07 DIAGNOSIS — I10 Essential (primary) hypertension: Secondary | ICD-10-CM | POA: Diagnosis not present

## 2016-10-14 DIAGNOSIS — I10 Essential (primary) hypertension: Secondary | ICD-10-CM | POA: Diagnosis not present

## 2016-10-14 DIAGNOSIS — I482 Chronic atrial fibrillation: Secondary | ICD-10-CM | POA: Diagnosis not present

## 2016-10-14 DIAGNOSIS — E119 Type 2 diabetes mellitus without complications: Secondary | ICD-10-CM | POA: Diagnosis not present

## 2016-10-14 DIAGNOSIS — M170 Bilateral primary osteoarthritis of knee: Secondary | ICD-10-CM | POA: Diagnosis not present

## 2016-10-14 DIAGNOSIS — S8002XD Contusion of left knee, subsequent encounter: Secondary | ICD-10-CM | POA: Diagnosis not present

## 2016-10-14 DIAGNOSIS — G5600 Carpal tunnel syndrome, unspecified upper limb: Secondary | ICD-10-CM | POA: Diagnosis not present

## 2016-10-15 DIAGNOSIS — K219 Gastro-esophageal reflux disease without esophagitis: Secondary | ICD-10-CM | POA: Diagnosis not present

## 2016-10-15 DIAGNOSIS — F411 Generalized anxiety disorder: Secondary | ICD-10-CM | POA: Diagnosis not present

## 2016-10-15 DIAGNOSIS — Z0000 Encounter for general adult medical examination without abnormal findings: Secondary | ICD-10-CM | POA: Diagnosis not present

## 2016-10-15 DIAGNOSIS — I482 Chronic atrial fibrillation: Secondary | ICD-10-CM | POA: Diagnosis not present

## 2016-10-15 DIAGNOSIS — E782 Mixed hyperlipidemia: Secondary | ICD-10-CM | POA: Diagnosis not present

## 2016-10-15 DIAGNOSIS — K589 Irritable bowel syndrome without diarrhea: Secondary | ICD-10-CM | POA: Diagnosis not present

## 2016-10-15 DIAGNOSIS — D649 Anemia, unspecified: Secondary | ICD-10-CM | POA: Diagnosis not present

## 2016-10-15 DIAGNOSIS — I481 Persistent atrial fibrillation: Secondary | ICD-10-CM | POA: Diagnosis not present

## 2016-10-15 DIAGNOSIS — S8002XD Contusion of left knee, subsequent encounter: Secondary | ICD-10-CM | POA: Diagnosis not present

## 2016-10-15 DIAGNOSIS — E039 Hypothyroidism, unspecified: Secondary | ICD-10-CM | POA: Diagnosis not present

## 2016-10-15 DIAGNOSIS — E119 Type 2 diabetes mellitus without complications: Secondary | ICD-10-CM | POA: Diagnosis not present

## 2016-10-15 DIAGNOSIS — I10 Essential (primary) hypertension: Secondary | ICD-10-CM | POA: Diagnosis not present

## 2016-10-15 DIAGNOSIS — E11319 Type 2 diabetes mellitus with unspecified diabetic retinopathy without macular edema: Secondary | ICD-10-CM | POA: Diagnosis not present

## 2016-10-15 DIAGNOSIS — K5790 Diverticulosis of intestine, part unspecified, without perforation or abscess without bleeding: Secondary | ICD-10-CM | POA: Diagnosis not present

## 2016-10-15 DIAGNOSIS — M8580 Other specified disorders of bone density and structure, unspecified site: Secondary | ICD-10-CM | POA: Diagnosis not present

## 2016-10-15 DIAGNOSIS — G5600 Carpal tunnel syndrome, unspecified upper limb: Secondary | ICD-10-CM | POA: Diagnosis not present

## 2016-10-15 DIAGNOSIS — M170 Bilateral primary osteoarthritis of knee: Secondary | ICD-10-CM | POA: Diagnosis not present

## 2016-10-20 DIAGNOSIS — S8002XD Contusion of left knee, subsequent encounter: Secondary | ICD-10-CM | POA: Diagnosis not present

## 2016-10-20 DIAGNOSIS — E119 Type 2 diabetes mellitus without complications: Secondary | ICD-10-CM | POA: Diagnosis not present

## 2016-10-20 DIAGNOSIS — I10 Essential (primary) hypertension: Secondary | ICD-10-CM | POA: Diagnosis not present

## 2016-10-20 DIAGNOSIS — G5600 Carpal tunnel syndrome, unspecified upper limb: Secondary | ICD-10-CM | POA: Diagnosis not present

## 2016-10-20 DIAGNOSIS — M170 Bilateral primary osteoarthritis of knee: Secondary | ICD-10-CM | POA: Diagnosis not present

## 2016-10-20 DIAGNOSIS — I482 Chronic atrial fibrillation: Secondary | ICD-10-CM | POA: Diagnosis not present

## 2016-10-21 DIAGNOSIS — E119 Type 2 diabetes mellitus without complications: Secondary | ICD-10-CM | POA: Diagnosis not present

## 2016-10-21 DIAGNOSIS — M170 Bilateral primary osteoarthritis of knee: Secondary | ICD-10-CM | POA: Diagnosis not present

## 2016-10-21 DIAGNOSIS — G5600 Carpal tunnel syndrome, unspecified upper limb: Secondary | ICD-10-CM | POA: Diagnosis not present

## 2016-10-21 DIAGNOSIS — S8002XD Contusion of left knee, subsequent encounter: Secondary | ICD-10-CM | POA: Diagnosis not present

## 2016-10-21 DIAGNOSIS — I10 Essential (primary) hypertension: Secondary | ICD-10-CM | POA: Diagnosis not present

## 2016-10-21 DIAGNOSIS — I482 Chronic atrial fibrillation: Secondary | ICD-10-CM | POA: Diagnosis not present

## 2016-10-27 DIAGNOSIS — G5600 Carpal tunnel syndrome, unspecified upper limb: Secondary | ICD-10-CM | POA: Diagnosis not present

## 2016-10-27 DIAGNOSIS — S8002XD Contusion of left knee, subsequent encounter: Secondary | ICD-10-CM | POA: Diagnosis not present

## 2016-10-27 DIAGNOSIS — E119 Type 2 diabetes mellitus without complications: Secondary | ICD-10-CM | POA: Diagnosis not present

## 2016-10-27 DIAGNOSIS — I482 Chronic atrial fibrillation: Secondary | ICD-10-CM | POA: Diagnosis not present

## 2016-10-27 DIAGNOSIS — I10 Essential (primary) hypertension: Secondary | ICD-10-CM | POA: Diagnosis not present

## 2016-10-27 DIAGNOSIS — M170 Bilateral primary osteoarthritis of knee: Secondary | ICD-10-CM | POA: Diagnosis not present

## 2016-10-28 DIAGNOSIS — S8002XD Contusion of left knee, subsequent encounter: Secondary | ICD-10-CM | POA: Diagnosis not present

## 2016-10-28 DIAGNOSIS — G5600 Carpal tunnel syndrome, unspecified upper limb: Secondary | ICD-10-CM | POA: Diagnosis not present

## 2016-10-28 DIAGNOSIS — I10 Essential (primary) hypertension: Secondary | ICD-10-CM | POA: Diagnosis not present

## 2016-10-28 DIAGNOSIS — E119 Type 2 diabetes mellitus without complications: Secondary | ICD-10-CM | POA: Diagnosis not present

## 2016-10-28 DIAGNOSIS — I482 Chronic atrial fibrillation: Secondary | ICD-10-CM | POA: Diagnosis not present

## 2016-10-28 DIAGNOSIS — M170 Bilateral primary osteoarthritis of knee: Secondary | ICD-10-CM | POA: Diagnosis not present

## 2016-11-03 DIAGNOSIS — E119 Type 2 diabetes mellitus without complications: Secondary | ICD-10-CM | POA: Diagnosis not present

## 2016-11-03 DIAGNOSIS — I482 Chronic atrial fibrillation: Secondary | ICD-10-CM | POA: Diagnosis not present

## 2016-11-03 DIAGNOSIS — M170 Bilateral primary osteoarthritis of knee: Secondary | ICD-10-CM | POA: Diagnosis not present

## 2016-11-03 DIAGNOSIS — I10 Essential (primary) hypertension: Secondary | ICD-10-CM | POA: Diagnosis not present

## 2016-11-03 DIAGNOSIS — G5600 Carpal tunnel syndrome, unspecified upper limb: Secondary | ICD-10-CM | POA: Diagnosis not present

## 2016-11-03 DIAGNOSIS — S8002XD Contusion of left knee, subsequent encounter: Secondary | ICD-10-CM | POA: Diagnosis not present

## 2016-11-04 DIAGNOSIS — I482 Chronic atrial fibrillation: Secondary | ICD-10-CM | POA: Diagnosis not present

## 2016-11-04 DIAGNOSIS — M170 Bilateral primary osteoarthritis of knee: Secondary | ICD-10-CM | POA: Diagnosis not present

## 2016-11-04 DIAGNOSIS — S8002XD Contusion of left knee, subsequent encounter: Secondary | ICD-10-CM | POA: Diagnosis not present

## 2016-11-04 DIAGNOSIS — I10 Essential (primary) hypertension: Secondary | ICD-10-CM | POA: Diagnosis not present

## 2016-11-04 DIAGNOSIS — E119 Type 2 diabetes mellitus without complications: Secondary | ICD-10-CM | POA: Diagnosis not present

## 2016-11-04 DIAGNOSIS — G5600 Carpal tunnel syndrome, unspecified upper limb: Secondary | ICD-10-CM | POA: Diagnosis not present

## 2016-11-05 ENCOUNTER — Other Ambulatory Visit: Payer: Self-pay | Admitting: Cardiovascular Disease

## 2016-11-05 DIAGNOSIS — E113293 Type 2 diabetes mellitus with mild nonproliferative diabetic retinopathy without macular edema, bilateral: Secondary | ICD-10-CM | POA: Diagnosis not present

## 2016-11-05 DIAGNOSIS — H353132 Nonexudative age-related macular degeneration, bilateral, intermediate dry stage: Secondary | ICD-10-CM | POA: Diagnosis not present

## 2016-11-05 DIAGNOSIS — H43813 Vitreous degeneration, bilateral: Secondary | ICD-10-CM | POA: Diagnosis not present

## 2016-11-05 DIAGNOSIS — H35373 Puckering of macula, bilateral: Secondary | ICD-10-CM | POA: Diagnosis not present

## 2016-11-05 NOTE — Telephone Encounter (Signed)
Age 21yrs Wt 75.8kg (09/17/2016) Saw Dr Acie Fredrickson on 09/17/2016 09/17/2016 SrCr 0.83 09/17/2016 Hgb 10.2 HCT 31.3  CrCl  47.43 Refill for Xarelto 15mg  daily as requested

## 2016-12-09 ENCOUNTER — Telehealth: Payer: Self-pay | Admitting: Cardiovascular Disease

## 2016-12-09 NOTE — Telephone Encounter (Signed)
New message     Patient calling the office for samples of medication:   1.  What medication and dosage are you requesting samples for? Xarelto 15mg s   2.  Are you currently out of this medication? Almost out 6 pills left

## 2016-12-09 NOTE — Telephone Encounter (Signed)
I do not see where we did a PA or pt assistance to lower the cost of the pts Xarelto so I called [facility], the pts pharmacy, to find out how much Xarelto is costing the pt. I was advised that she pays $37 for a 30 day supply and that a PA is not needed.  I called the pt and asked if she can afford $37 a month for her Xarelto and she stated "no I cant".  I asked her if she has ever filled out paperwork for pts assistance and she stated "yes and I cant get it because I have Part D" (?).  There is no mention in the pts chart of [place] trying to lower the cost of her Xarelto and the office has been supplying her Xarelto off and on since she started taking it when she was a pt of Dr Sherryl Barters.  I explained to her that she can apply for pt assistance and have Part D and that will not effect the outcome. She stated "I cant pay for this medicine because its going to put me in the donut hole".  I explained to her that either she will need to apply for pts assistance, pay for her Xarelto or have Dr Acie Fredrickson change her to a medication that she can afford. She stated "I dont want to take Warfarin cause Im doing good on Xarelto and when one of my friends switched from Xarelto to Warfarin she died".  The pt insists that she does not want to take Warfarin, seems unwilling to apply for pt assistance and states that she cannot afford Xarelto.  I advised the pt that I am sending this message to Dr Acie Fredrickson and his nurse and to expect a call from his nurse tomorrow to discuss as there is nothing else I can do at this point. She verbalized understanding.

## 2016-12-10 NOTE — Telephone Encounter (Signed)
Spoke with patient to discuss the cost of Xarelto. I advised that she will need to continue the medication for her chronic afib. She does not want to pay $37 per month for the Xarelto. She is concerned about reaching the donut hole. I discussed the alternatives and patient states she is not interested in changing medications She has been given assistance paperwork and she states she knows she will be denied. I advised her that if she gets to the point where her medication is costing her more out of pocket, to call back to let us know for possible help with more samples and/or other forms of assistance. The patient states her friend gets Xarelto for free. I advised that that could be due to a different insurance plan. The patient verbalized understanding with plan and thanked me for the call.

## 2017-01-28 DIAGNOSIS — M869 Osteomyelitis, unspecified: Secondary | ICD-10-CM | POA: Diagnosis not present

## 2017-01-28 DIAGNOSIS — L821 Other seborrheic keratosis: Secondary | ICD-10-CM | POA: Diagnosis not present

## 2017-01-28 DIAGNOSIS — G5600 Carpal tunnel syndrome, unspecified upper limb: Secondary | ICD-10-CM | POA: Diagnosis not present

## 2017-01-30 DIAGNOSIS — I481 Persistent atrial fibrillation: Secondary | ICD-10-CM | POA: Diagnosis not present

## 2017-01-30 DIAGNOSIS — M150 Primary generalized (osteo)arthritis: Secondary | ICD-10-CM | POA: Diagnosis not present

## 2017-01-30 DIAGNOSIS — E11319 Type 2 diabetes mellitus with unspecified diabetic retinopathy without macular edema: Secondary | ICD-10-CM | POA: Diagnosis not present

## 2017-01-30 DIAGNOSIS — I10 Essential (primary) hypertension: Secondary | ICD-10-CM | POA: Diagnosis not present

## 2017-01-30 DIAGNOSIS — G5600 Carpal tunnel syndrome, unspecified upper limb: Secondary | ICD-10-CM | POA: Diagnosis not present

## 2017-01-30 DIAGNOSIS — H8110 Benign paroxysmal vertigo, unspecified ear: Secondary | ICD-10-CM | POA: Diagnosis not present

## 2017-02-04 DIAGNOSIS — G8929 Other chronic pain: Secondary | ICD-10-CM | POA: Diagnosis not present

## 2017-02-04 DIAGNOSIS — M25562 Pain in left knee: Secondary | ICD-10-CM | POA: Diagnosis not present

## 2017-02-05 DIAGNOSIS — I10 Essential (primary) hypertension: Secondary | ICD-10-CM | POA: Diagnosis not present

## 2017-02-05 DIAGNOSIS — G5600 Carpal tunnel syndrome, unspecified upper limb: Secondary | ICD-10-CM | POA: Diagnosis not present

## 2017-02-05 DIAGNOSIS — H8110 Benign paroxysmal vertigo, unspecified ear: Secondary | ICD-10-CM | POA: Diagnosis not present

## 2017-02-05 DIAGNOSIS — I481 Persistent atrial fibrillation: Secondary | ICD-10-CM | POA: Diagnosis not present

## 2017-02-05 DIAGNOSIS — M150 Primary generalized (osteo)arthritis: Secondary | ICD-10-CM | POA: Diagnosis not present

## 2017-02-05 DIAGNOSIS — E11319 Type 2 diabetes mellitus with unspecified diabetic retinopathy without macular edema: Secondary | ICD-10-CM | POA: Diagnosis not present

## 2017-02-09 DIAGNOSIS — E11319 Type 2 diabetes mellitus with unspecified diabetic retinopathy without macular edema: Secondary | ICD-10-CM | POA: Diagnosis not present

## 2017-02-09 DIAGNOSIS — E113293 Type 2 diabetes mellitus with mild nonproliferative diabetic retinopathy without macular edema, bilateral: Secondary | ICD-10-CM | POA: Diagnosis not present

## 2017-02-09 DIAGNOSIS — H8110 Benign paroxysmal vertigo, unspecified ear: Secondary | ICD-10-CM | POA: Diagnosis not present

## 2017-02-09 DIAGNOSIS — Z7984 Long term (current) use of oral hypoglycemic drugs: Secondary | ICD-10-CM | POA: Diagnosis not present

## 2017-02-09 DIAGNOSIS — E039 Hypothyroidism, unspecified: Secondary | ICD-10-CM | POA: Diagnosis not present

## 2017-02-09 DIAGNOSIS — E782 Mixed hyperlipidemia: Secondary | ICD-10-CM | POA: Diagnosis not present

## 2017-02-09 DIAGNOSIS — I10 Essential (primary) hypertension: Secondary | ICD-10-CM | POA: Diagnosis not present

## 2017-02-09 DIAGNOSIS — G5600 Carpal tunnel syndrome, unspecified upper limb: Secondary | ICD-10-CM | POA: Diagnosis not present

## 2017-02-09 DIAGNOSIS — I481 Persistent atrial fibrillation: Secondary | ICD-10-CM | POA: Diagnosis not present

## 2017-02-09 DIAGNOSIS — M150 Primary generalized (osteo)arthritis: Secondary | ICD-10-CM | POA: Diagnosis not present

## 2017-02-09 DIAGNOSIS — Z23 Encounter for immunization: Secondary | ICD-10-CM | POA: Diagnosis not present

## 2017-02-10 DIAGNOSIS — I10 Essential (primary) hypertension: Secondary | ICD-10-CM | POA: Diagnosis not present

## 2017-02-10 DIAGNOSIS — H8110 Benign paroxysmal vertigo, unspecified ear: Secondary | ICD-10-CM | POA: Diagnosis not present

## 2017-02-10 DIAGNOSIS — E11319 Type 2 diabetes mellitus with unspecified diabetic retinopathy without macular edema: Secondary | ICD-10-CM | POA: Diagnosis not present

## 2017-02-10 DIAGNOSIS — M150 Primary generalized (osteo)arthritis: Secondary | ICD-10-CM | POA: Diagnosis not present

## 2017-02-10 DIAGNOSIS — G5600 Carpal tunnel syndrome, unspecified upper limb: Secondary | ICD-10-CM | POA: Diagnosis not present

## 2017-02-10 DIAGNOSIS — I481 Persistent atrial fibrillation: Secondary | ICD-10-CM | POA: Diagnosis not present

## 2017-02-13 DIAGNOSIS — G5600 Carpal tunnel syndrome, unspecified upper limb: Secondary | ICD-10-CM | POA: Diagnosis not present

## 2017-02-13 DIAGNOSIS — H8110 Benign paroxysmal vertigo, unspecified ear: Secondary | ICD-10-CM | POA: Diagnosis not present

## 2017-02-13 DIAGNOSIS — M150 Primary generalized (osteo)arthritis: Secondary | ICD-10-CM | POA: Diagnosis not present

## 2017-02-13 DIAGNOSIS — I10 Essential (primary) hypertension: Secondary | ICD-10-CM | POA: Diagnosis not present

## 2017-02-13 DIAGNOSIS — I481 Persistent atrial fibrillation: Secondary | ICD-10-CM | POA: Diagnosis not present

## 2017-02-13 DIAGNOSIS — E11319 Type 2 diabetes mellitus with unspecified diabetic retinopathy without macular edema: Secondary | ICD-10-CM | POA: Diagnosis not present

## 2017-02-16 DIAGNOSIS — I10 Essential (primary) hypertension: Secondary | ICD-10-CM | POA: Diagnosis not present

## 2017-02-16 DIAGNOSIS — E11319 Type 2 diabetes mellitus with unspecified diabetic retinopathy without macular edema: Secondary | ICD-10-CM | POA: Diagnosis not present

## 2017-02-16 DIAGNOSIS — M150 Primary generalized (osteo)arthritis: Secondary | ICD-10-CM | POA: Diagnosis not present

## 2017-02-16 DIAGNOSIS — I481 Persistent atrial fibrillation: Secondary | ICD-10-CM | POA: Diagnosis not present

## 2017-02-16 DIAGNOSIS — G5600 Carpal tunnel syndrome, unspecified upper limb: Secondary | ICD-10-CM | POA: Diagnosis not present

## 2017-02-16 DIAGNOSIS — H8110 Benign paroxysmal vertigo, unspecified ear: Secondary | ICD-10-CM | POA: Diagnosis not present

## 2017-02-17 DIAGNOSIS — H8110 Benign paroxysmal vertigo, unspecified ear: Secondary | ICD-10-CM | POA: Diagnosis not present

## 2017-02-17 DIAGNOSIS — I481 Persistent atrial fibrillation: Secondary | ICD-10-CM | POA: Diagnosis not present

## 2017-02-17 DIAGNOSIS — I10 Essential (primary) hypertension: Secondary | ICD-10-CM | POA: Diagnosis not present

## 2017-02-17 DIAGNOSIS — G5600 Carpal tunnel syndrome, unspecified upper limb: Secondary | ICD-10-CM | POA: Diagnosis not present

## 2017-02-17 DIAGNOSIS — E11319 Type 2 diabetes mellitus with unspecified diabetic retinopathy without macular edema: Secondary | ICD-10-CM | POA: Diagnosis not present

## 2017-02-17 DIAGNOSIS — M150 Primary generalized (osteo)arthritis: Secondary | ICD-10-CM | POA: Diagnosis not present

## 2017-02-20 DIAGNOSIS — I10 Essential (primary) hypertension: Secondary | ICD-10-CM | POA: Diagnosis not present

## 2017-02-20 DIAGNOSIS — G5600 Carpal tunnel syndrome, unspecified upper limb: Secondary | ICD-10-CM | POA: Diagnosis not present

## 2017-02-20 DIAGNOSIS — E11319 Type 2 diabetes mellitus with unspecified diabetic retinopathy without macular edema: Secondary | ICD-10-CM | POA: Diagnosis not present

## 2017-02-20 DIAGNOSIS — H8110 Benign paroxysmal vertigo, unspecified ear: Secondary | ICD-10-CM | POA: Diagnosis not present

## 2017-02-20 DIAGNOSIS — I481 Persistent atrial fibrillation: Secondary | ICD-10-CM | POA: Diagnosis not present

## 2017-02-20 DIAGNOSIS — M150 Primary generalized (osteo)arthritis: Secondary | ICD-10-CM | POA: Diagnosis not present

## 2017-02-23 DIAGNOSIS — H8110 Benign paroxysmal vertigo, unspecified ear: Secondary | ICD-10-CM | POA: Diagnosis not present

## 2017-02-23 DIAGNOSIS — E11319 Type 2 diabetes mellitus with unspecified diabetic retinopathy without macular edema: Secondary | ICD-10-CM | POA: Diagnosis not present

## 2017-02-23 DIAGNOSIS — M150 Primary generalized (osteo)arthritis: Secondary | ICD-10-CM | POA: Diagnosis not present

## 2017-02-23 DIAGNOSIS — I481 Persistent atrial fibrillation: Secondary | ICD-10-CM | POA: Diagnosis not present

## 2017-02-23 DIAGNOSIS — I10 Essential (primary) hypertension: Secondary | ICD-10-CM | POA: Diagnosis not present

## 2017-02-23 DIAGNOSIS — G5600 Carpal tunnel syndrome, unspecified upper limb: Secondary | ICD-10-CM | POA: Diagnosis not present

## 2017-02-27 DIAGNOSIS — H8110 Benign paroxysmal vertigo, unspecified ear: Secondary | ICD-10-CM | POA: Diagnosis not present

## 2017-02-27 DIAGNOSIS — M150 Primary generalized (osteo)arthritis: Secondary | ICD-10-CM | POA: Diagnosis not present

## 2017-02-27 DIAGNOSIS — G5600 Carpal tunnel syndrome, unspecified upper limb: Secondary | ICD-10-CM | POA: Diagnosis not present

## 2017-02-27 DIAGNOSIS — I10 Essential (primary) hypertension: Secondary | ICD-10-CM | POA: Diagnosis not present

## 2017-02-27 DIAGNOSIS — E11319 Type 2 diabetes mellitus with unspecified diabetic retinopathy without macular edema: Secondary | ICD-10-CM | POA: Diagnosis not present

## 2017-02-27 DIAGNOSIS — I481 Persistent atrial fibrillation: Secondary | ICD-10-CM | POA: Diagnosis not present

## 2017-03-04 ENCOUNTER — Encounter: Payer: Self-pay | Admitting: Physician Assistant

## 2017-03-17 ENCOUNTER — Encounter: Payer: Self-pay | Admitting: Physician Assistant

## 2017-03-17 ENCOUNTER — Other Ambulatory Visit: Payer: Self-pay

## 2017-03-17 ENCOUNTER — Ambulatory Visit (INDEPENDENT_AMBULATORY_CARE_PROVIDER_SITE_OTHER): Payer: Medicare Other | Admitting: Physician Assistant

## 2017-03-17 ENCOUNTER — Telehealth: Payer: Self-pay

## 2017-03-17 VITALS — BP 142/56 | HR 73 | Ht 63.0 in | Wt 167.1 lb

## 2017-03-17 DIAGNOSIS — I119 Hypertensive heart disease without heart failure: Secondary | ICD-10-CM | POA: Diagnosis not present

## 2017-03-17 DIAGNOSIS — I482 Chronic atrial fibrillation: Secondary | ICD-10-CM

## 2017-03-17 DIAGNOSIS — H8110 Benign paroxysmal vertigo, unspecified ear: Secondary | ICD-10-CM | POA: Diagnosis not present

## 2017-03-17 DIAGNOSIS — I4821 Permanent atrial fibrillation: Secondary | ICD-10-CM

## 2017-03-17 MED ORDER — RIVAROXABAN 15 MG PO TABS: 15.0000 mg | ORAL_TABLET | Freq: Every day | ORAL | 3 refills | Status: DC

## 2017-03-17 NOTE — Progress Notes (Signed)
Cardiology Office Note:    Date:  03/17/2017   ID:  Amanda [name], DOB March 17, 1919, MRN [id number]  PCP:  Mayra Neer, MD  Cardiologist:  Dr. Liam Rogers    Referring MD: Mayra Neer, MD   Chief Complaint  Patient presents with  . Atrial Fibrillation    Follow-up    History of Present Illness:    Amanda [name] is a 83 y.o. female with a hx of permanent atrial fibrillation on long-term anticoagulation, prior stroke, normal nuclear stress test in 2006, hypertension, diabetes, hypothyroidism.  She was previously followed by Dr. Mare Ferrari.  She was previously on warfarin and is now on Xarelto (INR subtherapeutic at the time of her stroke).  Last seen by Dr. Acie Fredrickson 5/18.  Her Xarelto had been placed on hold due to a knee injury.  It was recommended that this be resumed as soon as her orthopedist gave the all clear.  Amanda [name] returns for follow-up.  She is here with her friend.  She has not had any chest discomfort, syncope, paroxysmal nocturnal dyspnea, lower extremity edema.  She denies significant shortness of breath.  She denies any bleeding issues.  She is having significant difficulty with the cost of Xarelto.  She is currently in the "donut hole".  She has a history of vertigo and is currently experiencing dizziness today.  Prior CV studies:   The following studies were reviewed today:  Echocardiogram 05/24/14 EF 55-60, normal wall motion, mild AI, MAC, moderate TR, PASP 39  Nuclear stress test 06/2004 EF 81, no ischemia  Past Medical History:  Diagnosis Date  . Carpal tunnel syndrome   . Chronic anticoagulation   . Chronic atrial fibrillation ([place])   . Diabetes mellitus    NON-INSULIN  . Hypothyroidism   . Sinus congestion     Past Surgical History:  Procedure Laterality Date  . APPENDECTOMY    . CARDIOVASCULAR STRESS TEST  07/10/2004   EF 81%  . CHOLECYSTECTOMY    . US ECHOCARDIOGRAPHY  12/06/2007   EF 55-60%    Current Medications: Current  Meds  Medication Sig  . acetaminophen (TYLENOL) 500 MG tablet Take 1,000 mg by mouth every 6 (six) hours as needed (for pain).  . Arnica 20 % TINC Apply 1 application topically as directed.  Marland Kitchen glimepiride (AMARYL) 1 MG tablet Take 1.5 mg by mouth daily with breakfast.   . levothyroxine (SYNTHROID, LEVOTHROID) 88 MCG tablet Take 88 mcg by mouth daily.    Marland Kitchen lisinopril (PRINIVIL,ZESTRIL) 10 MG tablet Take 10 mg by mouth daily.    Marland Kitchen MAGNESIUM PO Take 1 capsule by mouth daily.  . meclizine (ANTIVERT) 25 MG tablet Take one every 6-8 hours as needed for dizziness  . Multiple Vitamins-Minerals (PRESERVISION AREDS 2 PO) Take 1 capsule by mouth 2 (two) times daily.   . Omega-3 Fatty Acids (FISH OIL) 1000 MG CAPS Take 1 capsule by mouth every morning.  . ondansetron (ZOFRAN-ODT) 4 MG disintegrating tablet Take 4 mg by mouth every 4 (four) hours as needed for nausea or vomiting.  . Probiotic Product (PROBIOTIC DAILY PO) Take 2 tablets by mouth daily as needed (DIGESTION).   . Rivaroxaban (XARELTO) 15 MG TABS tablet Take 1 tablet (15 mg total) by mouth daily with supper.  . traMADol (ULTRAM) 50 MG tablet Take by mouth every 6 (six) hours as needed (pain).     Allergies:   Other; Tape; Codeine; Hydrocodone; Metoprolol; Rythmol [propafenone hcl]; Shellfish allergy; and Sulfa antibiotics  Social History  Substance Use Topics  . Smoking status: Never Smoker  . Smokeless tobacco: Never Used  . Alcohol use No     Family Hx: The patient's family history includes Diabetes in her sister; Hypertension in her mother and unknown relative; Stroke in her mother. There is no history of Heart attack.  ROS:   Please see the history of present illness.    Review of Systems  HENT: Positive for hearing loss.   Eyes: Positive for visual disturbance.  Hematologic/Lymphatic: Bruises/bleeds easily.  Neurological: Positive for dizziness.   All other systems reviewed and are negative.   EKGs/Labs/Other Test  Reviewed:    EKG:  EKG is  ordered today.  The ekg ordered today demonstrates atrial fibrillation, heart rate 73  Recent Labs: 09/11/2016: ALT 17 09/17/2016: BUN 17; Creatinine, Ser 0.83; Hemoglobin 10.2; Platelets 297; Potassium 4.8; Sodium 136   Creatinine Clearance (Cockcroft-Gault Equation) from [email or web address]  on 03/17/2017 ** All calculations should be rechecked by clinician prior to use **  RESULT SUMMARY: 47 mL/min Creatinine clearance, original Cockcroft-Gault  INPUTS: Sex -> 1 = Female Age -> 89 years Weight -> 167 lbs Creatinine -> 0.83 mg/dL   Recent Lipid Panel Lab Results  Component Value Date/Time   CHOL 189 05/24/2014 05:34 AM   TRIG 147 05/24/2014 05:34 AM   HDL 47 05/24/2014 05:34 AM   CHOLHDL 4.0 05/24/2014 05:34 AM   LDLCALC 113 (H) 05/24/2014 05:34 AM   LDLDIRECT 137.0 01/06/2012 12:11 PM    Physical Exam:    VS:  BP (!) 142/56   Pulse 73   Ht 5' 3"  (1.6 m)   Wt 167 lb 1.9 oz (75.8 kg)   BMI 29.60 kg/m     Wt Readings from Last 3 Encounters:  03/17/17 167 lb 1.9 oz (75.8 kg)  09/17/16 167 lb (75.8 kg)  09/06/16 167 lb (75.8 kg)     Physical Exam  Constitutional: She is oriented to person, place, and time. She appears well-developed and well-nourished. No distress.  HENT:  Head: Normocephalic and atraumatic.  Neck: No JVD present.  Cardiovascular: Normal rate.  An irregularly irregular rhythm present.  No murmur heard. Pulmonary/Chest: Effort normal. She has no rales.  Abdominal: Soft.  Musculoskeletal: She exhibits no edema.  Neurological: She is alert and oriented to person, place, and time.  Skin: Skin is warm and dry.    ASSESSMENT:    1. Permanent atrial fibrillation ([place])   2. Benign hypertensive heart disease without heart failure   3. Benign paroxysmal positional vertigo, unspecified laterality    PLAN:    In order of problems listed above:  1.  Permanent atrial fibrillation (HCC) Creatinine clearance is 47 mL/min.  She  should continue Xarelto 15 mg daily.  She denies bleeding issues.  Her heart rate is currently controlled.  2.  Benign hypertensive heart disease without heart failure Fair control.  Continue current therapy.  3.  Benign paroxysmal positional vertigo, unspecified laterality I briefly explained to her how vestibular rehabilitation can help with control of vertigo.  She has discussed this in the past with her primary care physician.   Dispo:  Return in about 6 months (around 09/15/2017) for Routine Follow Up, w/ Dr. Acie Fredrickson.   Medication Adjustments/Labs and Tests Ordered: Current medicines are reviewed at length with the patient today.  Concerns regarding medicines are outlined above.  Tests Ordered: Orders Placed This Encounter  Procedures  . EKG 12-Lead   Medication Changes: No orders of the  defined types were placed in this encounter.   Signed, Richardson Dopp, PA-C  03/17/2017 1:02 PM    [facility] [place], [place], [place]  [address] Phone: [phone]; Fax: [phone]

## 2017-03-17 NOTE — Telephone Encounter (Addendum)
**Note De-Identified Kyrillos Adams Obfuscation** The pt was given samples of Xarelto  and a Wynetta Emery and [employer] pt assistance application for Xarelto while she was here for an OV with Richardson Dopp, PA-c this am.  I have completed the provider part of application, printed a Xarelto RX and placed both in Dr Lanny Hurst mail bin awaiting his signature.

## 2017-03-17 NOTE — Patient Instructions (Signed)
Medication Instructions:  1. Your physician recommends that you continue on your current medications as directed. Please refer to the Current Medication list given to you today.   Labwork: NONE ORDERED TODAY  Testing/Procedures: NONE ORDERED TODAY  Follow-Up: Your physician wants you to follow-up in: 6 MONTHS WITH DR. NAHSER You will receive a reminder letter in the mail two months in advance. If you don't receive a letter, please call our office to schedule the follow-up appointment.   Any Other Special Instructions Will Be Listed Below (If Applicable).     If you need a refill on your cardiac medications before your next appointment, please call your pharmacy.   

## 2017-03-18 NOTE — Telephone Encounter (Signed)
Dr Acie Fredrickson has signed the application and RX. I have placed in yellow folder in the PA Dept. Awaiting the pts part of the application.

## 2017-04-06 ENCOUNTER — Telehealth: Payer: Self-pay | Admitting: Cardiovascular Disease

## 2017-04-06 NOTE — Telephone Encounter (Signed)
**Note De-Identified Amanda [name] Obfuscation** The pt called needing help filling out her Amanda [name] and Amanda [name] pt assistance application. I have answered all of her questions concerning questions on her application. She verbalized understanding and states that she will return her application when she comes to the office to pick up a couple bottles of Xarelto samples that I have left for her to pick up.

## 2017-04-06 NOTE — Telephone Encounter (Signed)
New message    Patient calling the office for samples of medication:   1.  What medication and dosage are you requesting samples for?Rivaroxaban (XARELTO) 15 MG TABS tablet  2.  Are you currently out of this medication? 1 week supply remaining

## 2017-04-13 NOTE — Telephone Encounter (Signed)
**Note De-Identified Amanda [name] Obfuscation** The pt returned her part of the pt assistance application. I have faxed it along with the provider part and Xarelto RX to [facility].

## 2017-04-14 NOTE — Telephone Encounter (Signed)
I called Wynetta Emery and [employer]etta Emery and s/w Dolores Lory concerning the pts application for pt assistance. Per Dolores Lory they did not receive the pts entire application and that they only need the first page of the pts 1040 not the entire report.  I have re-faxed the pts entire application to [employer].

## 2017-05-05 DIAGNOSIS — H43813 Vitreous degeneration, bilateral: Secondary | ICD-10-CM | POA: Diagnosis not present

## 2017-05-05 DIAGNOSIS — H35373 Puckering of macula, bilateral: Secondary | ICD-10-CM | POA: Diagnosis not present

## 2017-05-05 DIAGNOSIS — H353131 Nonexudative age-related macular degeneration, bilateral, early dry stage: Secondary | ICD-10-CM | POA: Diagnosis not present

## 2017-05-06 DIAGNOSIS — G8929 Other chronic pain: Secondary | ICD-10-CM | POA: Diagnosis not present

## 2017-05-06 DIAGNOSIS — M25562 Pain in left knee: Secondary | ICD-10-CM | POA: Diagnosis not present

## 2017-06-12 DIAGNOSIS — E11319 Type 2 diabetes mellitus with unspecified diabetic retinopathy without macular edema: Secondary | ICD-10-CM | POA: Diagnosis not present

## 2017-06-12 DIAGNOSIS — Z7984 Long term (current) use of oral hypoglycemic drugs: Secondary | ICD-10-CM | POA: Diagnosis not present

## 2017-06-12 DIAGNOSIS — I481 Persistent atrial fibrillation: Secondary | ICD-10-CM | POA: Diagnosis not present

## 2017-06-12 DIAGNOSIS — I10 Essential (primary) hypertension: Secondary | ICD-10-CM | POA: Diagnosis not present

## 2017-06-12 DIAGNOSIS — E039 Hypothyroidism, unspecified: Secondary | ICD-10-CM | POA: Diagnosis not present

## 2017-06-12 DIAGNOSIS — E782 Mixed hyperlipidemia: Secondary | ICD-10-CM | POA: Diagnosis not present

## 2017-07-03 DIAGNOSIS — M25511 Pain in right shoulder: Secondary | ICD-10-CM | POA: Diagnosis not present

## 2017-07-08 ENCOUNTER — Telehealth: Payer: Self-pay | Admitting: Physician Assistant

## 2017-07-08 NOTE — Telephone Encounter (Signed)
Patient calling,   States that she has some info to share in regards to her Xarelto PA form

## 2017-07-08 NOTE — Telephone Encounter (Signed)
The pt states that her Lorane Gell copay has gone up again and is now $40 for a 30 day supply. She wants to know what we can do to lower the cost.  She was denied pt assistance on Xarelto through [place] because she has insurance coverage.  She is advised that we can ask to have her Xarelto changed to a medication that she can better afford but she refused stating that she does well on Xarelto and does not want to switch to another medication.  She is concerned about "going into the doughnut hole" and not being able to afford her medications.  I have advised her to contact me when she goes into the doughnut hole and that I will help her apply for pt assistance at that time and that in the mean time I will mail her some prescription drug savings cards that hopefully will save her money on all of her meds.  She verbalized understanding and thanked me for my help.

## 2017-09-02 ENCOUNTER — Encounter: Payer: Self-pay | Admitting: Physician Assistant

## 2017-09-11 ENCOUNTER — Encounter: Payer: Self-pay | Admitting: Physician Assistant

## 2017-09-11 ENCOUNTER — Ambulatory Visit (INDEPENDENT_AMBULATORY_CARE_PROVIDER_SITE_OTHER): Payer: Medicare Other | Admitting: Physician Assistant

## 2017-09-11 VITALS — BP 126/74 | HR 64 | Ht 63.0 in | Wt 168.0 lb

## 2017-09-11 DIAGNOSIS — I119 Hypertensive heart disease without heart failure: Secondary | ICD-10-CM

## 2017-09-11 DIAGNOSIS — I4821 Permanent atrial fibrillation: Secondary | ICD-10-CM

## 2017-09-11 DIAGNOSIS — I482 Chronic atrial fibrillation: Secondary | ICD-10-CM

## 2017-09-11 LAB — BASIC METABOLIC PANEL
BUN/Creatinine Ratio: 20 (ref 12–28)
BUN: 18 mg/dL (ref 10–36)
CALCIUM: 9 mg/dL (ref 8.7–10.3)
CHLORIDE: 101 mmol/L (ref 96–106)
CO2: 24 mmol/L (ref 20–29)
Creatinine, Ser: 0.91 mg/dL (ref 0.57–1.00)
GFR calc Af Amer: 62 mL/min/{1.73_m2} (ref >59)
GFR, EST NON AFRICAN AMERICAN: 53 mL/min/{1.73_m2} — AB (ref >59)
Glucose: 181 mg/dL — ABNORMAL HIGH (ref 65–99)
POTASSIUM: 4.5 mmol/L (ref 3.5–5.2)
Sodium: 140 mmol/L (ref 134–144)

## 2017-09-11 LAB — CBC
HEMOGLOBIN: 12.2 g/dL (ref 11.1–15.9)
Hematocrit: 37.6 % (ref 34.0–46.6)
MCH: 31.9 pg (ref 26.6–33.0)
MCHC: 32.4 g/dL (ref 31.5–35.7)
MCV: 98 fL — ABNORMAL HIGH (ref 79–97)
Platelets: 211 10*3/uL (ref 150–379)
RBC: 3.83 x10E6/uL (ref 3.77–5.28)
RDW: 14.1 % (ref 12.3–15.4)
WBC: 6.4 10*3/uL (ref 3.4–10.8)

## 2017-09-11 NOTE — Progress Notes (Signed)
Cardiology Office Note:    Date:  09/11/2017   ID:  Amanda [name], DOB 05/23/1918, MRN [id number]  PCP:  Mayra Neer, MD  Cardiologist:  Mertie Moores, MD   Referring MD: Mayra Neer, MD   Chief Complaint  Patient presents with  . Follow-up    AFib    History of Present Illness:    Amanda [name] is a 84 y.o. female with permanent atrial fibrillation on long-term anticoagulation, prior stroke, normal nuclear stress test in 2006, hypertension, diabetes, hypothyroidism.  She was previously on warfarin and was changed to Xarelto (INR subtherapeutic at the time of her stroke).  She was last seen October 2018.  Amanda [name] returns for follow up.  She is here with a friend.  She has been doing fairly well since last seen.  She denies chest pain, syncope, paroxysmal nocturnal dyspnea, significant edema.  She does get short of breath with walking long distances.  She fell in April 2018 and uses a quad cane now.    Prior CV studies:   The following studies were reviewed today:  Echocardiogram 05/24/14 EF 55-60, normal wall motion, mild AI, MAC, moderate TR, PASP 39  Nuclear stress test 06/2004 EF 81, no ischemia  Past Medical History:  Diagnosis Date  . Carpal tunnel syndrome   . Chronic anticoagulation   . Chronic atrial fibrillation ([place])   . Diabetes mellitus    NON-INSULIN  . Hypothyroidism   . Sinus congestion    Surgical Hx: The patient  has a past surgical history that includes Cholecystectomy; Appendectomy; US ECHOCARDIOGRAPHY (12/06/2007); and Cardiovascular stress test (07/10/2004).   Current Medications: Current Meds  Medication Sig  . acetaminophen (TYLENOL) 500 MG tablet Take 1,000 mg by mouth every 6 (six) hours as needed (for pain).  . Arnica 20 % TINC Apply 1 application topically as directed.  Marland Kitchen glimepiride (AMARYL) 1 MG tablet Take 1.5 mg by mouth daily with breakfast.   . levothyroxine (SYNTHROID, LEVOTHROID) 88 MCG tablet Take 88 mcg by mouth  daily.    Marland Kitchen lisinopril (PRINIVIL,ZESTRIL) 10 MG tablet Take 10 mg by mouth daily.    . meclizine (ANTIVERT) 25 MG tablet Take one every 6-8 hours as needed for dizziness  . Multiple Vitamins-Minerals (PRESERVISION AREDS 2 PO) Take 1 capsule by mouth 2 (two) times daily.   . Omega-3 Fatty Acids (FISH OIL) 1000 MG CAPS Take 1 capsule by mouth every morning.  . ondansetron (ZOFRAN-ODT) 4 MG disintegrating tablet Take 4 mg by mouth every 4 (four) hours as needed for nausea or vomiting.  . Probiotic Product (PROBIOTIC DAILY PO) Take 2 tablets by mouth daily as needed (DIGESTION).   . Rivaroxaban (XARELTO) 15 MG TABS tablet Take 1 tablet (15 mg total) by mouth daily with supper.     Allergies:   Other; Tape; Codeine; Hydrocodone; Metoprolol; Rythmol [propafenone hcl]; Shellfish allergy; and Sulfa antibiotics   Social History   Tobacco Use  . Smoking status: Never Smoker  . Smokeless tobacco: Never Used  Substance Use Topics  . Alcohol use: No    Alcohol/week: 0.0 oz  . Drug use: No     Family Hx: The patient's family history includes Diabetes in her sister; Hypertension in her mother and unknown relative; Stroke in her mother. There is no history of Heart attack.  ROS:   Please see the history of present illness.    Review of Systems  Constitution: Positive for malaise/fatigue.  HENT: Positive for hearing loss.  Eyes: Positive for visual disturbance.  Hematologic/Lymphatic: Bruises/bleeds easily.  Neurological: Positive for dizziness and loss of balance.   All other systems reviewed and are negative.   EKGs/Labs/Other Test Reviewed:    EKG:  EKG is  ordered today.  The ekg ordered today demonstrates atrial fibrillation, heart rate 64  Recent Labs: 09/11/2016: ALT 17 09/17/2016: BUN 17; Creatinine, Ser 0.83; Hemoglobin 10.2; Platelets 297; Potassium 4.8; Sodium 136   Recent Lipid Panel Lab Results  Component Value Date/Time   CHOL 189 05/24/2014 05:34 AM   TRIG 147 05/24/2014  05:34 AM   HDL 47 05/24/2014 05:34 AM   CHOLHDL 4.0 05/24/2014 05:34 AM   LDLCALC 113 (H) 05/24/2014 05:34 AM   LDLDIRECT 137.0 01/06/2012 12:11 PM    Physical Exam:    VS:  BP 126/74   Pulse 64   Ht _0  (1.6 m)   Wt 168 lb (76.2 kg)   BMI 29.76 kg/m     Wt Readings from Last 3 Encounters:  09/11/17 168 lb (76.2 kg)  03/17/17 167 lb 1.9 oz (75.8 kg)  09/17/16 167 lb (75.8 kg)     Physical Exam  Constitutional: She is oriented to person, place, and time. She appears well-developed and well-nourished. No distress.  HENT:  Head: Normocephalic and atraumatic.  Neck: Neck supple. No JVD present.  Cardiovascular: Normal rate, S1 normal, S2 normal and normal heart sounds. An irregularly irregular rhythm present.  No murmur heard. Pulmonary/Chest: Effort normal. She has no rales.  Abdominal: Soft.  Musculoskeletal: She exhibits no edema.  Neurological: She is alert and oriented to person, place, and time.  Skin: Skin is warm and dry.    ASSESSMENT & PLAN:    #1.  Permanent atrial fibrillation (HCC) Rate is controlled.  She is tolerating Rivaroxaban.  She has issues with affording Rivaroxaban.  She tells me that her cost is $500 a month once she gets into the "donut hole."  She was denied for assistance last year.  I have given her the name of Eliquis (Apixaban) so that she can see if this is covered any better for her.  I will also give her samples of Rivaroxaban today.  Obtain follow up BMET, CBC.  Follow up in 6 months.   #2.  Benign hypertensive heart disease without heart failure The patient's blood pressure is controlled on her current regimen.  Continue current therapy.     Dispo:  Return in about 6 months (around 03/13/2018) for Routine Follow Up, w/ Dr. Acie Fredrickson.   Medication Adjustments/Labs and Tests Ordered: Current medicines are reviewed at length with the patient today.  Concerns regarding medicines are outlined above.  Tests Ordered: Orders Placed This  Encounter  Procedures  . Basic Metabolic Panel (BMET)  . CBC  . EKG 12-Lead   Medication Changes: No orders of the defined types were placed in this encounter.   Signed, Richardson Dopp, PA-C  09/11/2017 11:32 AM    [facility] [address] [place], [place], [place]  [address] Phone: [phone]; Fax: [phone]

## 2017-09-11 NOTE — Patient Instructions (Signed)
Medication Instructions: Your physician recommends that you continue on your current medications as directed. Please refer to the Current Medication list given to you today.   Labwork: TODAY: BMET & CBC  Procedures/Testing: None Ordered  Follow-Up: Your physician recommends that you schedule a follow-up appointment in: 6 months with Dr.Nahser   Any Additional Special Instructions Will Be Listed Below (If Applicable).     If you need a refill on your cardiac medications before your next appointment, please call your pharmacy.

## 2017-09-22 DIAGNOSIS — Z974 Presence of external hearing-aid: Secondary | ICD-10-CM | POA: Diagnosis not present

## 2017-09-22 DIAGNOSIS — H903 Sensorineural hearing loss, bilateral: Secondary | ICD-10-CM | POA: Diagnosis not present

## 2017-09-22 DIAGNOSIS — H6123 Impacted cerumen, bilateral: Secondary | ICD-10-CM | POA: Diagnosis not present

## 2017-10-21 ENCOUNTER — Other Ambulatory Visit: Payer: Self-pay | Admitting: Cardiovascular Disease

## 2017-10-21 NOTE — Telephone Encounter (Signed)
Xarelto 15mg  refill request received; pt is 84 yrs old, wt-76.2kg, Crea-0.91 on 09/11/17, last seen by Richardson Dopp on 09/11/17, CrCl-42.85ml/min; will send in refill to requested pharmacy.

## 2017-10-22 DIAGNOSIS — H903 Sensorineural hearing loss, bilateral: Secondary | ICD-10-CM | POA: Diagnosis not present

## 2017-10-27 DIAGNOSIS — J301 Allergic rhinitis due to pollen: Secondary | ICD-10-CM | POA: Diagnosis not present

## 2017-10-27 DIAGNOSIS — M8580 Other specified disorders of bone density and structure, unspecified site: Secondary | ICD-10-CM | POA: Diagnosis not present

## 2017-10-27 DIAGNOSIS — I10 Essential (primary) hypertension: Secondary | ICD-10-CM | POA: Diagnosis not present

## 2017-10-27 DIAGNOSIS — E039 Hypothyroidism, unspecified: Secondary | ICD-10-CM | POA: Diagnosis not present

## 2017-10-27 DIAGNOSIS — E782 Mixed hyperlipidemia: Secondary | ICD-10-CM | POA: Diagnosis not present

## 2017-10-27 DIAGNOSIS — I481 Persistent atrial fibrillation: Secondary | ICD-10-CM | POA: Diagnosis not present

## 2017-10-27 DIAGNOSIS — F411 Generalized anxiety disorder: Secondary | ICD-10-CM | POA: Diagnosis not present

## 2017-10-27 DIAGNOSIS — E11319 Type 2 diabetes mellitus with unspecified diabetic retinopathy without macular edema: Secondary | ICD-10-CM | POA: Diagnosis not present

## 2017-10-27 DIAGNOSIS — K589 Irritable bowel syndrome without diarrhea: Secondary | ICD-10-CM | POA: Diagnosis not present

## 2017-10-27 DIAGNOSIS — Z0000 Encounter for general adult medical examination without abnormal findings: Secondary | ICD-10-CM | POA: Diagnosis not present

## 2017-10-27 DIAGNOSIS — M150 Primary generalized (osteo)arthritis: Secondary | ICD-10-CM | POA: Diagnosis not present

## 2017-10-27 DIAGNOSIS — K219 Gastro-esophageal reflux disease without esophagitis: Secondary | ICD-10-CM | POA: Diagnosis not present

## 2017-11-17 ENCOUNTER — Telehealth: Payer: Self-pay | Admitting: Physician Assistant

## 2017-11-17 NOTE — Telephone Encounter (Signed)
New Message ° ° °Patient calling the office for samples of medication: ° ° °1.  What medication and dosage are you requesting samples for? Rivaroxaban (XARELTO) 15 MG TABS tablet ° °2.  Are you currently out of this medication? yes ° ° °

## 2017-11-17 NOTE — Telephone Encounter (Signed)
**Note De-Identified Amanda [name] Obfuscation** The pt is requesting samples of Xarelto as she states that she is trying to prevent going into the donut hole which will make all of her medications more expensive.  She is not interested in pt asst as she states that she has applied in the past and was denied.  She states that she has not requested any samples at all this year and would appreciate if we can help her out from time to time.  I advised the pt that I have left her some Xarelto samples in the front office and that she may pick up anytime between 8 am and 5 pm M-F.  She verbalized understanding and thanked me for my help.

## 2017-11-25 DIAGNOSIS — H353132 Nonexudative age-related macular degeneration, bilateral, intermediate dry stage: Secondary | ICD-10-CM | POA: Diagnosis not present

## 2017-11-25 DIAGNOSIS — E113293 Type 2 diabetes mellitus with mild nonproliferative diabetic retinopathy without macular edema, bilateral: Secondary | ICD-10-CM | POA: Diagnosis not present

## 2017-11-25 DIAGNOSIS — H43813 Vitreous degeneration, bilateral: Secondary | ICD-10-CM | POA: Diagnosis not present

## 2017-12-22 DIAGNOSIS — L02821 Furuncle of head [any part, except face]: Secondary | ICD-10-CM | POA: Diagnosis not present

## 2017-12-22 DIAGNOSIS — H6122 Impacted cerumen, left ear: Secondary | ICD-10-CM | POA: Diagnosis not present

## 2017-12-22 DIAGNOSIS — H938X2 Other specified disorders of left ear: Secondary | ICD-10-CM | POA: Diagnosis not present

## 2017-12-22 DIAGNOSIS — Z974 Presence of external hearing-aid: Secondary | ICD-10-CM | POA: Diagnosis not present

## 2018-01-06 ENCOUNTER — Telehealth: Payer: Self-pay | Admitting: Physician Assistant

## 2018-01-06 NOTE — Telephone Encounter (Signed)
New Message:   Patient calling the office for samples of medication:   1.  What medication and dosage are you requesting samples for?XARELTO 15 MG TABS tablet  2.  Are you currently out of this medication? No

## 2018-01-06 NOTE — Telephone Encounter (Signed)
Called patient to let her know that I could place some samples at the front desk. I also made her aware that we would not be able to supply her with samples for the remainder of the year. She verbalized her understanding and appreciation.

## 2018-01-07 DIAGNOSIS — R300 Dysuria: Secondary | ICD-10-CM | POA: Diagnosis not present

## 2018-01-07 DIAGNOSIS — N760 Acute vaginitis: Secondary | ICD-10-CM | POA: Diagnosis not present

## 2018-02-01 ENCOUNTER — Telehealth: Payer: Self-pay | Admitting: Cardiovascular Disease

## 2018-02-01 NOTE — Telephone Encounter (Signed)
New Message   Patient is requesting a call back from nurse. She would not provide me with the nature of the call. She only indicated that its personal

## 2018-02-24 DIAGNOSIS — I4819 Other persistent atrial fibrillation: Secondary | ICD-10-CM | POA: Diagnosis not present

## 2018-02-24 DIAGNOSIS — I10 Essential (primary) hypertension: Secondary | ICD-10-CM | POA: Diagnosis not present

## 2018-02-24 DIAGNOSIS — Z23 Encounter for immunization: Secondary | ICD-10-CM | POA: Diagnosis not present

## 2018-02-24 DIAGNOSIS — Z7984 Long term (current) use of oral hypoglycemic drugs: Secondary | ICD-10-CM | POA: Diagnosis not present

## 2018-02-24 DIAGNOSIS — F411 Generalized anxiety disorder: Secondary | ICD-10-CM | POA: Diagnosis not present

## 2018-02-24 DIAGNOSIS — G5600 Carpal tunnel syndrome, unspecified upper limb: Secondary | ICD-10-CM | POA: Diagnosis not present

## 2018-02-24 DIAGNOSIS — E039 Hypothyroidism, unspecified: Secondary | ICD-10-CM | POA: Diagnosis not present

## 2018-02-24 DIAGNOSIS — E113213 Type 2 diabetes mellitus with mild nonproliferative diabetic retinopathy with macular edema, bilateral: Secondary | ICD-10-CM | POA: Diagnosis not present

## 2018-02-26 ENCOUNTER — Encounter: Payer: Self-pay | Admitting: Cardiovascular Disease

## 2018-03-01 ENCOUNTER — Telehealth: Payer: Self-pay | Admitting: *Deleted

## 2018-03-01 NOTE — Telephone Encounter (Signed)
-----   Message from Liliane Shi, [place] sent at 03/01/2018 12:04 PM EDT ----- Labs from PCP reviewed.  Renal function, potassium, LFTs normal.   Recommendations:  - Continue current medications and follow up as planned.  Richardson Dopp, PA-C    03/01/2018 12:03 PM

## 2018-03-01 NOTE — Telephone Encounter (Signed)
Pt has been notified of lab results by phone with verbal understanding. See phone note: Pt asking about samples. Pt states to me that she has tried a few times to reach me though was told she could not talk to me. Pt states she then told the operator she was calling to to see if we had samples of Xarelto. She said she was told that because she was not a new pt she could not have any samples. Pt was given samples 11/17/17 and again 01/06/18, see phone notes. I will advise pt to try and apply for the assistance program again, that even though she was denied before she may qualify now. I s/w pt and d/w her to try applying again for the Assistance Prgm. Pt said no. I let her know we could not give her any samples at this time. Pt thanked me for the call.

## 2018-03-17 ENCOUNTER — Ambulatory Visit (INDEPENDENT_AMBULATORY_CARE_PROVIDER_SITE_OTHER): Payer: Medicare Other | Admitting: Cardiovascular Disease

## 2018-03-17 ENCOUNTER — Encounter: Payer: Self-pay | Admitting: Cardiovascular Disease

## 2018-03-17 VITALS — BP 144/62 | HR 73 | Ht 63.0 in | Wt 165.0 lb

## 2018-03-17 DIAGNOSIS — I4821 Permanent atrial fibrillation: Secondary | ICD-10-CM | POA: Diagnosis not present

## 2018-03-17 NOTE — Patient Instructions (Signed)

## 2018-03-17 NOTE — Progress Notes (Signed)
Cardiology Office Note   Date:  03/17/2018   ID:  Amanda [name], DOB Mar 12, 1919, MRN [id number]  PCP:  Amanda Neer, MD  Cardiologist: Amanda Coco MD  Chief Complaint  Patient presents with  . Atrial Fibrillation   Problem List 1. Chronic atrial fib 2. CVA 3. Diabetes mellitus 4. Essential HTN 5. Hypothyroidism     Amanda [name] is a 84 y.o. female who presents for a scheduled follow-up visit.  This pleasant 84 year old lady is seen for a scheduled followup office visit. She has a history of established atrial fibrillation and is on long-term anticoagulation.. She does not have any history of ischemic heart disease. She had a normal nuclear stress test in 2006. She had an echocardiogram in 2009 which showed mild left atrial enlargement. The patient has a history of essential hypertension and diabetes and hypothyroidism. Since last visit she has been feeling well. She continues to live by herself in her own home. She still drives herself to her appointments and to the grocery store.  The patient had a recent hospital admission for 2 strokes which left her with initial difficulty with speaking.Her speech has subsequently returned to normal. The patient is now on Xarelto rather than warfarin. She denies any new cardiac symptoms. No chest pain. No shortness of breath. No dizziness or syncope. No awareness of rapid palpitations. She still drives and lives by herself.  She has occasional episodes of dizziness.  No syncope.  She has not been having any chest pain or increased shortness of breath.  She has not had any recurrent episodes of stroke or TIA  Oct 10, 2015:  [name] is seen for the 1st time - transfer from [place].   Seen with daughter in law - Amanda [name] .  Was hospitalized several weeks ago with vertigo  Has chronic A-fib - is on Xarelto 15 mg a day - we reduced the dose recently   Still lives alone,    Gets help with grocery shopping .     Nov. 20, 2017:  [name] is seen back to day for her atrial fib. BP is a little elevated.   Has been under some stress.  Is not driving anymore.  Feb. 2, 2018:  Seen with daughter in law, Amanda [name] She fell and injured her knee.   Her medical doctor has stopped her Xarelto until she was seen here today   March 17, 2018: Amanda [name] is seen for follow-up visit with a friend from [place].     She has atrial fibrillation. On Xarelto 15 mg a day - her creatinine cl. Is 53.  Doing well from a cardiac standpoint    Past Medical History:  Diagnosis Date  . Carpal tunnel syndrome   . Chronic anticoagulation   . Chronic atrial fibrillation   . Diabetes mellitus    NON-INSULIN  . Hypothyroidism   . Sinus congestion     Past Surgical History:  Procedure Laterality Date  . APPENDECTOMY    . CARDIOVASCULAR STRESS TEST  07/10/2004   EF 81%  . CHOLECYSTECTOMY    . US ECHOCARDIOGRAPHY  12/06/2007   EF 55-60%     Current Outpatient Medications  Medication Sig Dispense Refill  . acetaminophen (TYLENOL) 500 MG tablet Take 1,000 mg by mouth every 6 (six) hours as needed (for pain).    . Arnica 20 % TINC Apply 1 application topically as directed.    Marland Kitchen glimepiride (AMARYL) 1 MG tablet Take 1.5 mg  by mouth daily with breakfast.     . levothyroxine (SYNTHROID, LEVOTHROID) 88 MCG tablet Take 88 mcg by mouth daily.      Marland Kitchen lisinopril (PRINIVIL,ZESTRIL) 10 MG tablet Take 10 mg by mouth daily.      . meclizine (ANTIVERT) 25 MG tablet Take one every 6-8 hours as needed for dizziness 20 tablet 0  . Multiple Vitamins-Minerals (PRESERVISION AREDS 2 PO) Take 1 capsule by mouth 2 (two) times daily.     . Omega-3 Fatty Acids (FISH OIL) 1000 MG CAPS Take 1 capsule by mouth every morning.    . ondansetron (ZOFRAN-ODT) 4 MG disintegrating tablet Take 4 mg by mouth every 4 (four) hours as needed for nausea or vomiting.    . Probiotic Product (PROBIOTIC DAILY PO) Take 2 tablets by mouth daily as  needed (DIGESTION).     Marland Kitchen XARELTO 15 MG TABS tablet TAKE 1 TABLET(15 MG) BY MOUTH DAILY WITH SUPPER 30 tablet 5   No current facility-administered medications for this visit.     Allergies:   Other; Tape; Codeine; Hydrocodone; Metoprolol; Rythmol [propafenone hcl]; Shellfish allergy; and Sulfa antibiotics    Social History:  The patient  reports that she has never smoked. She has never used smokeless tobacco. She reports that she does not drink alcohol or use drugs.   Family History:  The patient's family history includes Diabetes in her sister; Hypertension in her mother and unknown relative; Stroke in her mother.    ROS:  Please see the history of present illness.   Otherwise, review of systems are positive for none.   All other systems are reviewed and negative.   Physical Exam: Blood pressure (!) 144/62, pulse 73, height 5\' 3"  (1.6 m), weight 165 lb (74.8 kg), SpO2 96 %.  GEN:  Well nourished, well developed in no acute distress HEENT: Normal NECK: No JVD; No carotid bruits LYMPHATICS: No lymphadenopathy CARDIAC: Irreg. Irreg. , no murmurs, rubs, gallops RESPIRATORY:  Clear to auscultation without rales, wheezing or rhonchi  ABDOMEN: Soft, non-tender, non-distended MUSCULOSKELETAL:  No edema; No deformity  SKIN: Warm and dry NEUROLOGIC:  Alert and oriented x 3   EKG:     Recent Labs: 09/11/2017: BUN 18; Creatinine, Ser 0.91; Hemoglobin 12.2; Platelets 211; Potassium 4.5; Sodium 140    Lipid Panel    Component Value Date/Time   CHOL 189 05/24/2014 0534   TRIG 147 05/24/2014 0534   HDL 47 05/24/2014 0534   CHOLHDL 4.0 05/24/2014 0534   VLDL 29 05/24/2014 0534   LDLCALC 113 (H) 05/24/2014 0534   LDLDIRECT 137.0 01/06/2012 1211      Wt Readings from Last 3 Encounters:  03/17/18 165 lb (74.8 kg)  09/11/17 168 lb (76.2 kg)  03/17/17 167 lb 1.9 oz (75.8 kg)        ASSESSMENT AND PLAN:  1. Permanent atrial fibrillation- In on Xarelto 15 mg - Cr. Cl is 42 so  this is the correct dose.  Will give her some samples of xarelto  2. essential hypertension without congestive heart failure BP is well controlled.     5. Hx  Of stroke felt to be embolic occurring at a time that her INR was 1.9.    Current medicines are reviewed at length with the patient today.  The patient does not have concerns regarding medicines.  The following changes have been made:  no change  Labs/ tests ordered today include:   No orders of the defined types were placed in this  encounter.    Disposition: No change in meds     Mertie Moores, MD  03/17/2018 10:40 AM    [address],  [address] [place], [place]  [address] Pager [phone] Phone: [phone]; Fax: [phone]

## 2018-05-24 ENCOUNTER — Encounter (INDEPENDENT_AMBULATORY_CARE_PROVIDER_SITE_OTHER): Payer: Medicare Other | Admitting: Ophthalmology

## 2018-05-24 DIAGNOSIS — H35033 Hypertensive retinopathy, bilateral: Secondary | ICD-10-CM | POA: Diagnosis not present

## 2018-05-24 DIAGNOSIS — I10 Essential (primary) hypertension: Secondary | ICD-10-CM

## 2018-05-24 DIAGNOSIS — H4423 Degenerative myopia, bilateral: Secondary | ICD-10-CM | POA: Diagnosis not present

## 2018-05-24 DIAGNOSIS — H43813 Vitreous degeneration, bilateral: Secondary | ICD-10-CM | POA: Diagnosis not present

## 2018-06-30 DIAGNOSIS — E782 Mixed hyperlipidemia: Secondary | ICD-10-CM | POA: Diagnosis not present

## 2018-06-30 DIAGNOSIS — E11319 Type 2 diabetes mellitus with unspecified diabetic retinopathy without macular edema: Secondary | ICD-10-CM | POA: Diagnosis not present

## 2018-06-30 DIAGNOSIS — L989 Disorder of the skin and subcutaneous tissue, unspecified: Secondary | ICD-10-CM | POA: Diagnosis not present

## 2018-06-30 DIAGNOSIS — I10 Essential (primary) hypertension: Secondary | ICD-10-CM | POA: Diagnosis not present

## 2018-06-30 DIAGNOSIS — Z7984 Long term (current) use of oral hypoglycemic drugs: Secondary | ICD-10-CM | POA: Diagnosis not present

## 2018-06-30 DIAGNOSIS — E039 Hypothyroidism, unspecified: Secondary | ICD-10-CM | POA: Diagnosis not present

## 2018-07-12 ENCOUNTER — Telehealth: Payer: Self-pay | Admitting: Cardiovascular Disease

## 2018-07-12 NOTE — Telephone Encounter (Signed)
Spoke with patient and advised her that she should only take Xarelto 15 mg as prescribed. I advised that her dose is determined by her age, weight, and kidney function and that she should not take the higher dose. She requests samples of Xarelto 15 mg. I advised her that we are placing 2 sample bottles at the front desk for her. She verbalized understanding and thanked me for the call.  Patient states her PCP gave her samples of Xarelto 20 mg and told her it was permissible for her to take that dose. I advised her not to take the 20 mg, only the 15 mg. She verbalized understanding and thanked me for the call.

## 2018-07-12 NOTE — Telephone Encounter (Signed)
New Message:     Pt says she is on 15 mg of Xarelto. She wants  To know if she have samples of 20 mg of Xarelto, can she take the 20 mg?

## 2018-07-28 DIAGNOSIS — L820 Inflamed seborrheic keratosis: Secondary | ICD-10-CM | POA: Diagnosis not present

## 2018-07-28 DIAGNOSIS — D229 Melanocytic nevi, unspecified: Secondary | ICD-10-CM | POA: Diagnosis not present

## 2018-07-28 DIAGNOSIS — D485 Neoplasm of uncertain behavior of skin: Secondary | ICD-10-CM | POA: Diagnosis not present

## 2018-07-28 DIAGNOSIS — R202 Paresthesia of skin: Secondary | ICD-10-CM | POA: Diagnosis not present

## 2018-09-22 ENCOUNTER — Other Ambulatory Visit: Payer: Self-pay | Admitting: Cardiovascular Disease

## 2018-09-22 NOTE — Telephone Encounter (Signed)
Pt last saw Dr Acie Fredrickson 03/17/18, last labs 06/30/18 Creat 0.86, age 85, weight 74.8kg, CrCl 44.15, based on CrCl pt is on appropriate dosage of Xarelto 15mg  QD.  Will refill rx.

## 2018-10-19 DIAGNOSIS — Z7189 Other specified counseling: Secondary | ICD-10-CM | POA: Diagnosis not present

## 2018-10-19 DIAGNOSIS — M79604 Pain in right leg: Secondary | ICD-10-CM | POA: Diagnosis not present

## 2018-10-21 DIAGNOSIS — M545 Low back pain: Secondary | ICD-10-CM | POA: Diagnosis not present

## 2018-10-21 DIAGNOSIS — M47817 Spondylosis without myelopathy or radiculopathy, lumbosacral region: Secondary | ICD-10-CM | POA: Diagnosis not present

## 2018-11-24 ENCOUNTER — Encounter (INDEPENDENT_AMBULATORY_CARE_PROVIDER_SITE_OTHER): Payer: Medicare Other | Admitting: Ophthalmology

## 2018-11-24 ENCOUNTER — Other Ambulatory Visit: Payer: Self-pay

## 2018-11-24 DIAGNOSIS — I10 Essential (primary) hypertension: Secondary | ICD-10-CM | POA: Diagnosis not present

## 2018-11-24 DIAGNOSIS — H35033 Hypertensive retinopathy, bilateral: Secondary | ICD-10-CM

## 2018-11-24 DIAGNOSIS — H35343 Macular cyst, hole, or pseudohole, bilateral: Secondary | ICD-10-CM

## 2018-11-24 DIAGNOSIS — H43813 Vitreous degeneration, bilateral: Secondary | ICD-10-CM

## 2018-11-24 DIAGNOSIS — H4423 Degenerative myopia, bilateral: Secondary | ICD-10-CM | POA: Diagnosis not present

## 2018-12-14 DIAGNOSIS — E113293 Type 2 diabetes mellitus with mild nonproliferative diabetic retinopathy without macular edema, bilateral: Secondary | ICD-10-CM | POA: Diagnosis not present

## 2018-12-14 DIAGNOSIS — H5213 Myopia, bilateral: Secondary | ICD-10-CM | POA: Diagnosis not present

## 2018-12-14 DIAGNOSIS — H31003 Unspecified chorioretinal scars, bilateral: Secondary | ICD-10-CM | POA: Diagnosis not present

## 2018-12-14 DIAGNOSIS — H353131 Nonexudative age-related macular degeneration, bilateral, early dry stage: Secondary | ICD-10-CM | POA: Diagnosis not present

## 2018-12-31 ENCOUNTER — Inpatient Hospital Stay (HOSPITAL_COMMUNITY)
Admission: EM | Admit: 2018-12-31 | Discharge: 2019-01-03 | DRG: 092 | Disposition: A | Discharge status: Home / Self Care | Payer: Medicare Other | Attending: Internal Medicine | Admitting: Internal Medicine

## 2018-12-31 ENCOUNTER — Other Ambulatory Visit: Payer: Self-pay

## 2018-12-31 ENCOUNTER — Emergency Department (HOSPITAL_COMMUNITY): Payer: Medicare Other

## 2018-12-31 DIAGNOSIS — Z20828 Contact with and (suspected) exposure to other viral communicable diseases: Secondary | ICD-10-CM | POA: Diagnosis not present

## 2018-12-31 DIAGNOSIS — R29810 Facial weakness: Secondary | ICD-10-CM | POA: Diagnosis not present

## 2018-12-31 DIAGNOSIS — Z882 Allergy status to sulfonamides status: Secondary | ICD-10-CM

## 2018-12-31 DIAGNOSIS — I69351 Hemiplegia and hemiparesis following cerebral infarction affecting right dominant side: Secondary | ICD-10-CM | POA: Diagnosis not present

## 2018-12-31 DIAGNOSIS — R4781 Slurred speech: Secondary | ICD-10-CM | POA: Diagnosis not present

## 2018-12-31 DIAGNOSIS — Z833 Family history of diabetes mellitus: Secondary | ICD-10-CM

## 2018-12-31 DIAGNOSIS — I4821 Permanent atrial fibrillation: Secondary | ICD-10-CM | POA: Diagnosis not present

## 2018-12-31 DIAGNOSIS — I10 Essential (primary) hypertension: Secondary | ICD-10-CM | POA: Diagnosis present

## 2018-12-31 DIAGNOSIS — R471 Dysarthria and anarthria: Secondary | ICD-10-CM | POA: Diagnosis present

## 2018-12-31 DIAGNOSIS — E538 Deficiency of other specified B group vitamins: Secondary | ICD-10-CM | POA: Diagnosis not present

## 2018-12-31 DIAGNOSIS — Z885 Allergy status to narcotic agent status: Secondary | ICD-10-CM

## 2018-12-31 DIAGNOSIS — Z823 Family history of stroke: Secondary | ICD-10-CM

## 2018-12-31 DIAGNOSIS — R29818 Other symptoms and signs involving the nervous system: Secondary | ICD-10-CM | POA: Diagnosis not present

## 2018-12-31 DIAGNOSIS — I639 Cerebral infarction, unspecified: Secondary | ICD-10-CM

## 2018-12-31 DIAGNOSIS — Z66 Do not resuscitate: Secondary | ICD-10-CM | POA: Diagnosis present

## 2018-12-31 DIAGNOSIS — R531 Weakness: Secondary | ICD-10-CM | POA: Diagnosis not present

## 2018-12-31 DIAGNOSIS — E119 Type 2 diabetes mellitus without complications: Secondary | ICD-10-CM | POA: Diagnosis present

## 2018-12-31 DIAGNOSIS — E785 Hyperlipidemia, unspecified: Secondary | ICD-10-CM

## 2018-12-31 DIAGNOSIS — Z91013 Allergy to seafood: Secondary | ICD-10-CM

## 2018-12-31 DIAGNOSIS — R4701 Aphasia: Secondary | ICD-10-CM | POA: Diagnosis not present

## 2018-12-31 DIAGNOSIS — Z79899 Other long term (current) drug therapy: Secondary | ICD-10-CM

## 2018-12-31 DIAGNOSIS — Z03818 Encounter for observation for suspected exposure to other biological agents ruled out: Secondary | ICD-10-CM | POA: Diagnosis not present

## 2018-12-31 DIAGNOSIS — Z79890 Hormone replacement therapy (postmenopausal): Secondary | ICD-10-CM

## 2018-12-31 DIAGNOSIS — Z8249 Family history of ischemic heart disease and other diseases of the circulatory system: Secondary | ICD-10-CM

## 2018-12-31 DIAGNOSIS — Z7901 Long term (current) use of anticoagulants: Secondary | ICD-10-CM

## 2018-12-31 DIAGNOSIS — E039 Hypothyroidism, unspecified: Secondary | ICD-10-CM | POA: Diagnosis present

## 2018-12-31 DIAGNOSIS — I4891 Unspecified atrial fibrillation: Secondary | ICD-10-CM

## 2018-12-31 LAB — CBC WITH DIFFERENTIAL/PLATELET
Abs Immature Granulocytes: 0.04 10*3/uL (ref 0.00–0.07)
Basophils Absolute: 0.1 10*3/uL (ref 0.0–0.1)
Basophils Relative: 1 %
Eosinophils Absolute: 0.1 10*3/uL (ref 0.0–0.5)
Eosinophils Relative: 2 %
HCT: 39.6 % (ref 36.0–46.0)
Hemoglobin: 12.7 g/dL (ref 12.0–15.0)
Immature Granulocytes: 1 %
Lymphocytes Relative: 20 %
Lymphs Abs: 1.4 10*3/uL (ref 0.7–4.0)
MCH: 32.5 pg (ref 26.0–34.0)
MCHC: 32.1 g/dL (ref 30.0–36.0)
MCV: 101.3 fL — ABNORMAL HIGH (ref 80.0–100.0)
Monocytes Absolute: 0.8 10*3/uL (ref 0.1–1.0)
Monocytes Relative: 11 %
Neutro Abs: 4.7 10*3/uL (ref 1.7–7.7)
Neutrophils Relative %: 65 %
Platelets: 182 10*3/uL (ref 150–400)
RBC: 3.91 MIL/uL (ref 3.87–5.11)
RDW: 13.8 % (ref 11.5–15.5)
WBC: 7.1 10*3/uL (ref 4.0–10.5)
nRBC: 0 % (ref 0.0–0.2)

## 2018-12-31 LAB — COMPREHENSIVE METABOLIC PANEL
ALT: 15 U/L (ref 0–44)
AST: 22 U/L (ref 15–41)
Albumin: 3.6 g/dL (ref 3.5–5.0)
Alkaline Phosphatase: 96 U/L (ref 38–126)
Anion gap: 10 (ref 5–15)
BUN: 14 mg/dL (ref 8–23)
CO2: 24 mmol/L (ref 22–32)
Calcium: 9.2 mg/dL (ref 8.9–10.3)
Chloride: 103 mmol/L (ref 98–111)
Creatinine, Ser: 0.76 mg/dL (ref 0.44–1.00)
GFR calc Af Amer: >60 mL/min (ref >60)
GFR calc non Af Amer: >60 mL/min (ref >60)
Glucose, Bld: 165 mg/dL — ABNORMAL HIGH (ref 70–99)
Potassium: 4.6 mmol/L (ref 3.5–5.1)
Sodium: 137 mmol/L (ref 135–145)
Total Bilirubin: 0.6 mg/dL (ref 0.3–1.2)
Total Protein: 6.7 g/dL (ref 6.5–8.1)

## 2018-12-31 LAB — PROTIME-INR
INR: 1.2 (ref 0.8–1.2)
Prothrombin Time: 15.1 seconds (ref 11.4–15.2)

## 2018-12-31 LAB — TROPONIN I (HIGH SENSITIVITY): Troponin I (High Sensitivity): 14 ng/L (ref <18)

## 2018-12-31 NOTE — Consult Note (Signed)
Referring Physician: Dr. Stark Jock    Chief Complaint: New onset of expressive and receptive dysphasia.   HPI: Amanda [name] is an 85 y.o. female with chronic atrial fibrillation on Xarelto who was brought in by EMS for new onset speech deficit. LKN was 1845 on Thursday when she spoke with family over the telephone. First noted to have a deficit today by neighbor when slurred speech was noted after she walked over to the neighbor's house. Of note, she did not use her cane that she normally uses, so there was a question of confusion. She has a history of CVA about 1 year ago with similar symptoms.   LSN: [id number] on Thursday tPA Given: No: On Eliquis  Past Medical History:  Diagnosis Date  . Carpal tunnel syndrome   . Chronic anticoagulation   . Chronic atrial fibrillation   . Diabetes mellitus    NON-INSULIN  . Hypothyroidism   . Sinus congestion     Past Surgical History:  Procedure Laterality Date  . APPENDECTOMY    . CARDIOVASCULAR STRESS TEST  07/10/2004   EF 81%  . CHOLECYSTECTOMY    . US ECHOCARDIOGRAPHY  12/06/2007   EF 55-60%    Family History  Problem Relation Age of Onset  . Stroke Mother   . Hypertension Mother   . Diabetes Sister   . Hypertension Unknown   . Heart attack Neg Hx    Social History:  reports that she has never smoked. She has never used smokeless tobacco. She reports that she does not drink alcohol or use drugs.  Allergies:  Allergies  Allergen Reactions  . Other Other (See Comments)    Calcium w/D causes constipation  . Tape Other (See Comments)    SKIN IS VERY THIN AND WILL TEAR AND BRUISE EASILY!! PLEASE USE COBAN WRAP  . Codeine Nausea Only  . Hydrocodone Nausea Only  . Metoprolol Nausea Only  . Rythmol [Propafenone Hcl] Other (See Comments)    Dizziness   . Shellfish Allergy Itching and Rash    MUSSELS (only)  . Sulfa Antibiotics Nausea Only    Home Medications:    ROS: Unable to obtain detailed ROS due to cognitive/communication  deficit.   Physical Examination: Blood pressure (!) 159/141, pulse 75, temperature 98 F (36.7 C), temperature source Oral, resp. rate 20, height 5\' 3"  (1.6 m), weight 74.8 kg, SpO2 96 %.  HEENT: [place]/AT Lungs: Respirations unlabored Ext: Warm and well perfused  Neurologic Examination: Mental Status: Awake and alert. Speech with decreased comprehension, only able to partially repeat a phrase, word-finding deficit intermittently, occasional dysarthria and occasional phonemic paraphasias. . Cranial Nerves: II:  Visual fields with increased tendency to gaze towards peripheral visual stimuli on her left rather than her right. Not able to cooperate with detailed visual field testing. Pupils equal. III,IV, VI: EOMI horizontally and vertically without nystagmus. No ptosis.  V,VII: Smile symmetric, facial temp sensation equal bilaterally VIII: hearing intact to voice IX,X: no hypophonia XI: Symmetric XII: midline tongue extension  Motor: Right : Upper extremity   5/5    Left:     Upper extremity   5/5  Lower extremity   5/5     Lower extremity   5/5 No pronator drift Sensory: Temp and light touch intact throughout, bilaterally. Unreliable responses to attempts to assess for DSS.  Deep Tendon Reflexes:  2+ bilateral brachioradialis and biceps.  Will not relax BLE sufficiently to assess patellars.  Plantars: No pathological Babinski response bilaterally.  Cerebellar:  No ataxia with FNF bilaterally  Gait: Deferred  No results found for this or any previous visit (from the past 48 hour(s)). No results found.  Assessment: 85 y.o. female presenting with expressive and receptive dysphasia 1. DDx primarily left temporal lobe ischemia/infarction versus cognitive fluctuation due to toxic/metabolic etiology or an undiagnosed incipient dementia 2. Out of the endovascular time window (> 24 hours since LKN) 3. Not a tPA candidate due to anticoagulation.  4. Stroke Risk Factors - Atrial fibrillation,  DM and prior stroke  Plan: 1. MRI and MRA of the brain without contrast 2. May need TTE and carotid ultrasound pending results of MRI.  3. Continue Xarelto 4. Telemetry monitoring 5. Frequent neuro checks 6. Modified permissive HTN protocol in the setting of advanced age. Correct BP if SBP is > 180  @Electronically  signed: Dr. Kerney Elbe 12/31/2018, 9:29 PM

## 2018-12-31 NOTE — ED Notes (Signed)
ACTIVATED CODE STOKE--Delainie Chavana

## 2018-12-31 NOTE — ED Triage Notes (Signed)
Brought in by [facility]. Pt walked to neighbors house stating she didn't feel right. Neighbor noticed pt had slurred speech and had walked over without her cane that she normally uses. Has a history of stroke about one year ago with similar symptoms. Pt lives home alone and last known normal was yesterday at 75, when she spoke to a family member on the phone

## 2018-12-31 NOTE — ED Provider Notes (Signed)
[facility] EMERGENCY DEPARTMENT Provider Note   CSN: [id number] Arrival date & time: 12/31/18  2050     History   Chief Complaint Chief Complaint  Patient presents with  . Altered Mental Status    HPI Amanda [name] is a 85 y.o. female.     Patient is a 85 year old female with past medical history of prior stroke, diabetes, hypertension, chronic atrial fibrillation on Xarelto.  She presents today for evaluation of strokelike symptoms.  Patient tells me that she was fine earlier today, then started feeling badly this afternoon.  She went to her neighbor's house and was reported to have difficulty speaking and forming her words.  911 was called and the patient was then transported here.  She denies to me she is experiencing any headache or weakness of her arms or legs.  She is having some difficulty expressing herself and speech is somewhat slurred.  She denies any fevers or chills.  She denies any other complaints.  She does tell me that this is similar to what she experienced with a stroke several years ago.  The history is provided by the patient.  Altered Mental Status Severity:  Moderate Most recent episode:  Today Episode history:  Single Timing:  Constant Progression:  Unchanged Chronicity:  New Context: not dementia     Past Medical History:  Diagnosis Date  . Carpal tunnel syndrome   . Chronic anticoagulation   . Chronic atrial fibrillation   . Diabetes mellitus    NON-INSULIN  . Hypothyroidism   . Sinus congestion     Patient Active Problem List   Diagnosis Date Noted  . 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 Anemia 09/28/2015  . Vertigo   . Chronic anticoagulation 05/24/2014  . History of CVA (cerebrovascular accident) 05/23/2014  . Permanent atrial fibrillation 05/23/2014  . Diabetes mellitus type 2, controlled ([facility]) 05/23/2014  . Slurred speech   . Encounter for therapeutic drug monitoring 08/19/2013  . Benign  hypertensive heart disease without heart failure 10/09/2010  . Hypothyroidism 10/09/2010    Past Surgical History:  Procedure Laterality Date  . APPENDECTOMY    . CARDIOVASCULAR STRESS TEST  07/10/2004   EF 81%  . CHOLECYSTECTOMY    . US ECHOCARDIOGRAPHY  12/06/2007   EF 55-60%     OB History   No obstetric history on file.      Home Medications    Prior to Admission medications   Medication Sig Start Date End Date Taking? Authorizing Provider  acetaminophen (TYLENOL) 500 MG tablet Take 1,000 mg by mouth every 6 (six) hours as needed (for pain).    [provider]  Arnica 20 % TINC Apply 1 application topically as directed.    [provider]  glimepiride (AMARYL) 1 MG tablet Take 1.5 mg by mouth daily with breakfast.     [provider]  levothyroxine (SYNTHROID, LEVOTHROID) 88 MCG tablet Take 88 mcg by mouth daily.      [provider]  lisinopril (PRINIVIL,ZESTRIL) 10 MG tablet Take 10 mg by mouth daily.      [provider]  meclizine (ANTIVERT) 25 MG tablet Take one every 6-8 hours as needed for dizziness 09/04/15   Milton Ferguson, MD  Multiple Vitamins-Minerals (PRESERVISION AREDS 2 PO) Take 1 capsule by mouth 2 (two) times daily.     [provider]  Omega-3 Fatty Acids (FISH OIL) 1000 MG CAPS Take 1 capsule by mouth every morning.  [provider]  ondansetron (ZOFRAN-ODT) 4 MG disintegrating tablet Take 4 mg by mouth every 4 (four) hours as needed for nausea or vomiting.    [provider]  Probiotic Product (PROBIOTIC DAILY PO) Take 2 tablets by mouth daily as needed (DIGESTION).     [provider]  XARELTO 15 MG TABS tablet TAKE 1 TABLET(15 MG) BY MOUTH DAILY WITH SUPPER 09/22/18   Nahser, Wonda Cheng, MD    Family History Family History  Problem Relation Age of Onset  . Stroke Mother   . Hypertension Mother   . Diabetes Sister   . Hypertension Unknown   . Heart attack Neg Hx      Social History Social History   Tobacco Use  . Smoking status: Never Smoker  . Smokeless tobacco: Never Used  Substance Use Topics  . Alcohol use: No    Alcohol/week: 0.0 standard drinks  . Drug use: No     Allergies   Other, Tape, Codeine, Hydrocodone, Metoprolol, Rythmol [propafenone hcl], Shellfish allergy, and Sulfa antibiotics   Review of Systems Review of Systems  All other systems reviewed and are negative.    Physical Exam Updated Vital Signs BP (!) 159/141 (BP Location: Left Arm)   Pulse 75   Temp 98 F (36.7 C) (Oral)   Resp 20   Ht 5\' 3"  (1.6 m)   Wt 74.8 kg   SpO2 96%   BMI 29.21 kg/m   Physical Exam Vitals signs and nursing note reviewed.  Constitutional:      General: She is not in acute distress.    Appearance: She is well-developed. She is not diaphoretic.  HENT:     Head: Normocephalic and atraumatic.  Neck:     Musculoskeletal: Normal range of motion and neck supple.  Cardiovascular:     Rate and Rhythm: Normal rate and regular rhythm.     Heart sounds: No murmur. No friction rub. No gallop.   Pulmonary:     Effort: Pulmonary effort is normal. No respiratory distress.     Breath sounds: Normal breath sounds. No wheezing.  Abdominal:     General: Bowel sounds are normal. There is no distension.     Palpations: Abdomen is soft.     Tenderness: There is no abdominal tenderness.  Musculoskeletal: Normal range of motion.  Skin:    General: Skin is warm and dry.  Neurological:     Mental Status: She is alert and oriented to person, place, and time.     Cranial Nerves: No cranial nerve deficit.     Sensory: No sensory deficit.     Motor: No weakness.     Coordination: Coordination normal.     Comments: Patient is noted to have slurred speech and some expressive a aphasia.  She is following commands appropriately and moving all extremities.  She is somewhat slow to respond, however strength and coordination seem normal.      ED  Treatments / Results  Labs (all labs ordered are listed, but only abnormal results are displayed) Labs Reviewed  COMPREHENSIVE METABOLIC PANEL  CBC WITH DIFFERENTIAL/PLATELET  PROTIME-INR  TROPONIN I (HIGH SENSITIVITY)    EKG None  Radiology No results found.  Procedures Procedures (including critical care time)  Medications Ordered in ED Medications - No data to display   Initial Impression / Assessment and Plan / ED Course  I have reviewed the triage vital signs and the nursing notes.  Pertinent labs & imaging results that were available  during my care of the patient were reviewed by me and considered in my medical decision making (see chart for details).  Patient is a 85 year old female brought by EMS for evaluation of slurred speech and possible CVA.  Patient has history of similar presentation 3 years ago and was diagnosed with a stroke.  Patient was last spoken to yesterday evening and that was the last time she was in her normal state of health.  Patient seen immediately upon presentation to the ER.  Care was discussed shortly afterward with Dr. Cheral Marker who has also evaluated the patient.  She has had a head CT showing no acute intracranial abnormality.  Patient will undergo MRI to further evaluate for a stroke.  Due to her last seen normal time of yesterday evening, patient is not a candidate for thrombolytics.  Disposition will be pending the results of the MRI.  Care signed out to Dr. Leonides Schanz at shift change.  She will obtain these results, discuss the findings with neurology, then determine the final disposition.  Plan of care discussed with patient and patient's daughter who is present at bedside.  CRITICAL CARE Performed by: Veryl Speak Total critical care time: 35 minutes Critical care time was exclusive of separately billable procedures and treating other patients. Critical care was necessary to treat or prevent imminent or life-threatening deterioration.  Critical care was time spent personally by me on the following activities: development of treatment plan with patient and/or surrogate as well as nursing, discussions with consultants, evaluation of patient's response to treatment, examination of patient, obtaining history from patient or surrogate, ordering and performing treatments and interventions, ordering and review of laboratory studies, ordering and review of radiographic studies, pulse oximetry and re-evaluation of patient's condition.   Final Clinical Impressions(s) / ED Diagnoses   Final diagnoses:  None    ED Discharge Orders    None       Veryl Speak, MD 12/31/18 2337

## 2018-12-31 NOTE — ED Notes (Signed)
Family [phone] has pt's medications and would like to come back when available.

## 2019-01-01 ENCOUNTER — Observation Stay (HOSPITAL_COMMUNITY): Payer: Medicare Other

## 2019-01-01 ENCOUNTER — Emergency Department (HOSPITAL_COMMUNITY): Payer: Medicare Other

## 2019-01-01 DIAGNOSIS — I4819 Other persistent atrial fibrillation: Secondary | ICD-10-CM | POA: Diagnosis not present

## 2019-01-01 DIAGNOSIS — I10 Essential (primary) hypertension: Secondary | ICD-10-CM

## 2019-01-01 DIAGNOSIS — R4701 Aphasia: Secondary | ICD-10-CM | POA: Diagnosis not present

## 2019-01-01 DIAGNOSIS — F801 Expressive language disorder: Secondary | ICD-10-CM | POA: Diagnosis not present

## 2019-01-01 DIAGNOSIS — I639 Cerebral infarction, unspecified: Secondary | ICD-10-CM | POA: Diagnosis not present

## 2019-01-01 DIAGNOSIS — R4781 Slurred speech: Secondary | ICD-10-CM | POA: Diagnosis not present

## 2019-01-01 LAB — LIPID PANEL
Cholesterol: 187 mg/dL (ref 0–200)
HDL: 57 mg/dL (ref >40)
LDL Cholesterol: 119 mg/dL — ABNORMAL HIGH (ref 0–99)
Total CHOL/HDL Ratio: 3.3 RATIO
Triglycerides: 55 mg/dL (ref <150)
VLDL: 11 mg/dL (ref 0–40)

## 2019-01-01 LAB — GLUCOSE, CAPILLARY
Glucose-Capillary: 201 mg/dL — ABNORMAL HIGH (ref 70–99)
Glucose-Capillary: 230 mg/dL — ABNORMAL HIGH (ref 70–99)
Glucose-Capillary: 269 mg/dL — ABNORMAL HIGH (ref 70–99)
Glucose-Capillary: 213 mg/dL — ABNORMAL HIGH (ref 70–99)

## 2019-01-01 LAB — URINALYSIS, ROUTINE W REFLEX MICROSCOPIC
Bilirubin Urine: NEGATIVE
Glucose, UA: 50 mg/dL — AB
Hgb urine dipstick: NEGATIVE
Ketones, ur: 5 mg/dL — AB
Leukocytes,Ua: NEGATIVE
Nitrite: NEGATIVE
Protein, ur: NEGATIVE mg/dL
Specific Gravity, Urine: 1.015 (ref 1.005–1.030)
pH: 7 (ref 5.0–8.0)

## 2019-01-01 LAB — SARS CORONAVIRUS 2 (TAT 6-24 HRS): SARS Coronavirus 2: NEGATIVE

## 2019-01-01 LAB — TROPONIN I (HIGH SENSITIVITY)
Troponin I (High Sensitivity): 23 ng/L — ABNORMAL HIGH (ref <18)
Troponin I (High Sensitivity): 26 ng/L — ABNORMAL HIGH (ref <18)

## 2019-01-01 LAB — HEMOGLOBIN A1C
Hgb A1c MFr Bld: 7.2 % — ABNORMAL HIGH (ref 4.8–5.6)
Mean Plasma Glucose: 159.94 mg/dL

## 2019-01-01 LAB — CBG MONITORING, ED: Glucose-Capillary: 115 mg/dL — ABNORMAL HIGH (ref 70–99)

## 2019-01-01 LAB — VITAMIN B12: Vitamin B-12: 140 pg/mL — ABNORMAL LOW (ref 180–914)

## 2019-01-01 LAB — TSH: TSH: 0.716 u[IU]/mL (ref 0.350–4.500)

## 2019-01-01 MED ORDER — HYDRALAZINE HCL 20 MG/ML IJ SOLN: 5.0000 mg | INTRAMUSCULAR | Status: DC | PRN

## 2019-01-01 MED ORDER — ACETAMINOPHEN 650 MG RE SUPP: 650.0000 mg | RECTAL | Status: DC | PRN

## 2019-01-01 MED ORDER — LORAZEPAM 2 MG/ML IJ SOLN
1.0000 mg | Freq: Once | INTRAMUSCULAR | Status: AC
  Administered 2019-01-01: 01:00:00 1 mg via INTRAVENOUS
  Filled 2019-01-01: qty 1

## 2019-01-01 MED ORDER — ACETAMINOPHEN 325 MG PO TABS: 650.0000 mg | ORAL_TABLET | ORAL | Status: DC | PRN

## 2019-01-01 MED ORDER — ENOXAPARIN SODIUM 40 MG/0.4ML ~~LOC~~ SOLN: 40.0000 mg | SUBCUTANEOUS | Status: DC

## 2019-01-01 MED ORDER — LORAZEPAM 2 MG/ML IJ SOLN
0.5000 mg | Freq: Once | INTRAMUSCULAR | Status: AC
  Administered 2019-01-01: 0.5 mg via INTRAVENOUS
  Filled 2019-01-01: qty 1

## 2019-01-01 MED ORDER — ACETAMINOPHEN 160 MG/5ML PO SOLN: 650.0000 mg | ORAL | Status: DC | PRN

## 2019-01-01 MED ORDER — STROKE: EARLY STAGES OF RECOVERY BOOK: Freq: Once | Status: DC

## 2019-01-01 MED ORDER — INSULIN ASPART 100 UNIT/ML ~~LOC~~ SOLN
0.0000 [IU] | Freq: Three times a day (TID) | SUBCUTANEOUS | Status: DC
  Administered 2019-01-02 (×2): 2 [IU] via SUBCUTANEOUS
  Administered 2019-01-02: 1 [IU] via SUBCUTANEOUS
  Administered 2019-01-03: 18:00:00 2 [IU] via SUBCUTANEOUS
  Administered 2019-01-03: 3 [IU] via SUBCUTANEOUS

## 2019-01-01 MED ORDER — RIVAROXABAN 15 MG PO TABS
15.0000 mg | ORAL_TABLET | Freq: Every day | ORAL | Status: DC
  Administered 2019-01-01 – 2019-01-03 (×3): 15 mg via ORAL
  Filled 2019-01-01 (×4): qty 1

## 2019-01-01 NOTE — ED Notes (Signed)
Pt transported to MRI 

## 2019-01-01 NOTE — Progress Notes (Signed)
Patient has arrived to 3W16; there is not an ER handoff note documented for this chart; report received via telephone. MD paged with Troponin elevations; awaiting call back.

## 2019-01-01 NOTE — ED Provider Notes (Signed)
12:45 AM  Assumed care in sign out.  Patient is a 85 year old female with history of previous CVA who lives alone who presents to the emergency department with slurred speech and expressive aphasia.  Last seen normal last night.  Dr. Cheral Marker with neurology has seen patient and recommends MRI and MRA of the brain.  He recommends admission to the hospital for further work-up.  Patient and family are aware of this plan.  Will discuss with hospitalist.  PCP is with [facility] physicians.  1:39 AM Discussed patient's case with hospitalist, Dr. Marlowe Sax.  I have recommended admission and patient (and family if present) agree with this plan. Admitting physician will place admission orders.   I reviewed all nursing notes, vitals, pertinent previous records, EKGs, lab and urine results, imaging (as available).     Ward, Delice Bison, DO 01/01/19 0139

## 2019-01-01 NOTE — Progress Notes (Signed)
PROGRESS NOTE    Amanda [name]  EKC:[id number] DOB: 07-11-1918 DOA: 12/31/2018 PCP: Mayra Neer, MD   Brief Narrative: 85 year old female with history of atrial fibrillation on Xarelto, non-insulin-dependent diabetes mellitus, hypothyroidism, previous stroke with slurred speech, right hemiplegia was no normal until 12/30/2018 1845.  Patient at baseline is independent of ADL IADL.  Patient went to neighbor's house, found to be confused with a difficulty speaking with expressive aphasia.  Did not have any other focal weakness.  Work-up in the emergency department no obvious signs of infection are noted.  CT head without contrast did not show any acute abnormality.  Patient underwent MRI, MRA of the brain showed poor quality to motion, however did not show any acute stroke.  Evaluated by neurology, recommends EEG to rule out any underlying seizures.  ##Expressive aphasia sudden onset -MRI showed negative for acute stroke -Neurology is following with the patient -Plan to do EEG -Carotid Dopplers-pending -Echocardiogram-pending  Atrial fibrillation permanent -Not on any rate controlling medications -Continue with Xarelto  Diabetes mellitus -Follow-up on sliding scale insulin  Hypertension -Controlled    Principal Problem:   Aphasia Active Problems:   Atrial fibrillation (HCC)   Diabetes mellitus type 2, controlled (HCC)   HTN (hypertension)     DVT prophylaxis: Xarelto Code Status: Full code Family Communication: Daughter  disposition Plan: Based on physical therapy elevation   Consultants:   Neurology  Procedures: Echocardiogram    Subjective: Per daughter patient has improvement with dysarthria.  Objective: Vitals:   01/01/19 0416 01/01/19 0444 01/01/19 0457 01/01/19 0810  BP:  (!) 164/80 138/86 (!) 131/59  Pulse: 72  80 (!) 50  Resp: 18  18 (!) 23  Temp:      TempSrc:      SpO2: 97%  96% 98%  Weight:      Height:       No intake or output data  in the 24 hours ending 01/01/19 1445 Filed Weights   12/31/18 2059  Weight: 74.8 kg    Examination:  General exam: Appears calm and comfortable  Respiratory system: Clear to auscultation. Respiratory effort normal. Cardiovascular system: S1 & S2 heard, RRR. No JVD, murmurs, rubs, gallops or clicks. No pedal edema. Gastrointestinal system: Abdomen is nondistended, soft and nontender. No organomegaly or masses felt. Normal bowel sounds heard. Central nervous system: Alert and oriented. No focal neurological deficits.  Mild dysarthria Extremities: Symmetric 5 x 5 power. Skin: No rashes, lesions or ulcers Psychiatry: Judgement and insight appear normal. Mood & affect appropriate.     Data Reviewed: I have personally reviewed following labs and imaging studies  CBC: Recent Labs  Lab 12/31/18 2103  WBC 7.1  NEUTROABS 4.7  HGB 12.7  HCT 39.6  MCV 101.3*  PLT 505   Basic Metabolic Panel: Recent Labs  Lab 12/31/18 2103  NA 137  K 4.6  CL 103  CO2 24  GLUCOSE 165*  BUN 14  CREATININE 0.76  CALCIUM 9.2   GFR: Estimated Creatinine Clearance: 38.1 mL/min (by C-G formula based on SCr of 0.76 mg/dL). Liver Function Tests: Recent Labs  Lab 12/31/18 2103  AST 22  ALT 15  ALKPHOS 96  BILITOT 0.6  PROT 6.7  ALBUMIN 3.6   No results for input(s): LIPASE, AMYLASE in the last 168 hours. No results for input(s): AMMONIA in the last 168 hours. Coagulation Profile: Recent Labs  Lab 12/31/18 2103  INR 1.2   Cardiac Enzymes: No results for input(s): CKTOTAL, CKMB, CKMBINDEX, TROPONINI  in the last 168 hours. BNP (last 3 results) No results for input(s): PROBNP in the last 8760 hours. HbA1C: Recent Labs    01/01/19 0615  HGBA1C 7.2*   CBG: Recent Labs  Lab 01/01/19 1138  GLUCAP 115*   Lipid Profile: Recent Labs    01/01/19 0615  CHOL 187  HDL 57  LDLCALC 119*  TRIG 55  CHOLHDL 3.3   Thyroid Function Tests: Recent Labs    01/01/19 0815  TSH 0.716    Anemia Panel: Recent Labs    01/01/19 0815  VITAMINB12 140*   Sepsis Labs: No results for input(s): PROCALCITON, LATICACIDVEN in the last 168 hours.  Recent Results (from the past 240 hour(s))  SARS CORONAVIRUS 2 Nasal Swab Aptima Multi Swab     Status: None   Collection Time: 01/01/19  4:00 AM   Specimen: Aptima Multi Swab; Nasal Swab  Result Value Ref Range Status   SARS Coronavirus 2 NEGATIVE NEGATIVE Final    Comment: (NOTE) SARS-CoV-2 target nucleic acids are NOT DETECTED. The SARS-CoV-2 RNA is generally detectable in upper and lower respiratory specimens during the acute phase of infection. Negative results do not preclude SARS-CoV-2 infection, do not rule out co-infections with other pathogens, and should not be used as the sole basis for treatment or other patient management decisions. Negative results must be combined with clinical observations, patient history, and epidemiological information. The expected result is Negative. Fact Sheet for Patients: SugarRoll.be Fact Sheet for Healthcare Providers: https://www.woods-mathews.com/ This test is not yet approved or cleared by the [place] FDA and  has been authorized for detection and/or diagnosis of SARS-CoV-2 by FDA under an Emergency Use Authorization (EUA). This EUA will remain  in effect (meaning this test can be used) for the duration of the COVID-19 declaration under Section 56 4(b)(1) of the Act, 21 U.S.C. section 360bbb-3(b)(1), unless the authorization is terminated or revoked sooner. Performed at [facility], [place] [address] [place], [place] [address]          Radiology Studies: Ct Head Wo Contrast  Result Date: 12/31/2018 CLINICAL DATA:  Focal neuro deficit EXAM: CT HEAD WITHOUT CONTRAST TECHNIQUE: Contiguous axial images were obtained from the base of the skull through the vertex without intravenous contrast. COMPARISON:  May 24, 2014 CT,  MRI Sep 28, 2015 FINDINGS: Brain: No evidence of acute territorial infarction, hemorrhage, hydrocephalus,extra-axial collection or mass lesion/mass effect. There is dilatation the ventricles and sulci consistent with age-related atrophy. Low-attenuation changes in the deep white matter consistent with small vessel ischemia. There is a lacunar infarct involving the right corona radiata adjacent to the caudate head. Vascular: No hyperdense vessel or unexpected calcification. Skull: The skull is intact. No fracture or focal lesion identified. Sinuses/Orbits: The visualized paranasal sinuses and mastoid air cells are clear. The orbits and globes intact. Other: None IMPRESSION: No acute intracranial abnormality. Findings consistent with age related atrophy and chronic small vessel ischemia Right lacunar infarct involving the corona radiata Electronically Signed   By: Prudencio Pair M.D.   On: 12/31/2018 23:05   Mr Angio Head Wo Contrast  Result Date: 01/01/2019 CLINICAL DATA:  Slurred speech and expressive aphasia. EXAM: MRI HEAD WITHOUT CONTRAST MRA HEAD WITHOUT CONTRAST TECHNIQUE: Multiplanar, multiecho pulse sequences of the brain and surrounding structures were obtained without intravenous contrast. Angiographic images of the head were obtained using MRA technique without contrast. COMPARISON:  Brain MRI 09/28/2015 FINDINGS: MRI HEAD FINDINGS The examination is markedly degraded by motion. Diffusion-weighted imaging shows  no evidence of acute ischemia. There is generalized atrophy. No midline shift or other mass effect. No FLAIR sequence was obtained. No axial T1-weighted image was obtained. Midline structures are unremarkable. MRA HEAD FINDINGS Severely motion degraded. Within that limitation, there is no proximal large vessel occlusion. Both middle cerebral arteries are patent to the level of the M1 bifurcation. The basilar artery is of normal caliber. The visualized intracranial internal carotid arteries are  normal. IMPRESSION: Truncated and severely motion degraded examination without acute ischemic infarct or proximal large vessel occlusion. Electronically Signed   By: Ulyses Jarred M.D.   On: 01/01/2019 03:16   Mr Brain Wo Contrast  Result Date: 01/01/2019 CLINICAL DATA:  Slurred speech and expressive aphasia. EXAM: MRI HEAD WITHOUT CONTRAST MRA HEAD WITHOUT CONTRAST TECHNIQUE: Multiplanar, multiecho pulse sequences of the brain and surrounding structures were obtained without intravenous contrast. Angiographic images of the head were obtained using MRA technique without contrast. COMPARISON:  Brain MRI 09/28/2015 FINDINGS: MRI HEAD FINDINGS The examination is markedly degraded by motion. Diffusion-weighted imaging shows no evidence of acute ischemia. There is generalized atrophy. No midline shift or other mass effect. No FLAIR sequence was obtained. No axial T1-weighted image was obtained. Midline structures are unremarkable. MRA HEAD FINDINGS Severely motion degraded. Within that limitation, there is no proximal large vessel occlusion. Both middle cerebral arteries are patent to the level of the M1 bifurcation. The basilar artery is of normal caliber. The visualized intracranial internal carotid arteries are normal. IMPRESSION: Truncated and severely motion degraded examination without acute ischemic infarct or proximal large vessel occlusion. Electronically Signed   By: Ulyses Jarred M.D.   On: 01/01/2019 03:16   Vas US Carotid  Result Date: 01/01/2019 Carotid Arterial Duplex Study Indications:       Speech disturbance. Risk Factors:      Diabetes, prior CVA. Other Factors:     Atrial fibrillation, on Xarelto. Limitations:       Bradycardia Comparison Study:  No prior study on file for comparison Performing Technologist: Sharion Dove RVS  Examination Guidelines: A complete evaluation includes B-mode imaging, spectral Doppler, color Doppler, and power Doppler as needed of all accessible portions of each  vessel. Bilateral testing is considered an integral part of a complete examination. Limited examinations for reoccurring indications may be performed as noted.  Right Carotid Findings: +----------+--------+--------+--------+--------+------------------+             PSV cm/s EDV cm/s Stenosis Describe Comments            +----------+--------+--------+--------+--------+------------------+  CCA Prox   73       7                          intimal thickening  +----------+--------+--------+--------+--------+------------------+  CCA Distal 67       11                         intimal thickening  +----------+--------+--------+--------+--------+------------------+  ICA Prox   69       13                calcific                     +----------+--------+--------+--------+--------+------------------+  ICA Distal 73       12                                             +----------+--------+--------+--------+--------+------------------+  ECA        117      6                                              +----------+--------+--------+--------+--------+------------------+ +----------+--------+-------+--------+-------------------+             PSV cm/s EDV cms Describe Arm Pressure (mmHG)  +----------+--------+-------+--------+-------------------+  Subclavian 80                                             +----------+--------+-------+--------+-------------------+ +---------+--------+--+--------+--+  Vertebral PSV cm/s 42 EDV cm/s 12  +---------+--------+--+--------+--+  Left Carotid Findings: +----------+--------+--------+--------+------------+------------------+             PSV cm/s EDV cm/s Stenosis Describe     Comments            +----------+--------+--------+--------+------------+------------------+  CCA Prox   114      16                             intimal thickening  +----------+--------+--------+--------+------------+------------------+  CCA Distal 109      11                             intimal thickening   +----------+--------+--------+--------+------------+------------------+  ICA Prox   60       12                heterogenous                     +----------+--------+--------+--------+------------+------------------+  ICA Distal 45       11                                                 +----------+--------+--------+--------+------------+------------------+  ECA        92       6                                                  +----------+--------+--------+--------+------------+------------------+ +----------+--------+--------+--------+-------------------+  Subclavian PSV cm/s EDV cm/s Describe Arm Pressure (mmHG)  +----------+--------+--------+--------+-------------------+             119                                             +----------+--------+--------+--------+-------------------+ +---------+--------+--+--------+-+  Vertebral PSV cm/s 35 EDV cm/s 8  +---------+--------+--+--------+-+  Summary: Right Carotid: The extracranial vessels were near-normal with only minimal wall                thickening or plaque. Left Carotid: The extracranial vessels were near-normal with only minimal wall               thickening or plaque. Vertebrals:  Bilateral vertebral arteries demonstrate antegrade flow. Subclavians: Normal flow hemodynamics  were seen in bilateral subclavian              arteries. *See table(s) above for measurements and observations.  Electronically signed by Antony Contras MD on 01/01/2019 at 12:46:36 PM.    Final         Scheduled Meds:   stroke: mapping our early stages of recovery book   Does not apply Once   insulin aspart  0-9 Units Subcutaneous TID WC   Rivaroxaban  15 mg Oral QAC supper   Continuous Infusions:   LOS: 0 days    Time spent: 37 minutes   Benecio Kluger, MD [facility] Pager [phone]  If 7PM-7AM, please contact night-coverage [email or web address] Password [facility] 01/01/2019, 2:46 PM

## 2019-01-01 NOTE — ED Notes (Addendum)
Patient unable to complete scan due to moving around in scanner. Hospitalitis paged and aware

## 2019-01-01 NOTE — Progress Notes (Signed)
Patient arrived to 3W16 A&O x4. Denies pain except for when moving lower extremities. Some slurred spech. Daughter-in-law at bediside. POC care provided to patient and relative. Nurse will continue to monitor.

## 2019-01-01 NOTE — Progress Notes (Signed)
STROKE TEAM PROGRESS NOTE   HISTORY OF PRESENT ILLNESS (per record) Amanda [name] is an 85 y.o. female with chronic atrial fibrillation on Xarelto who was brought in by EMS for new onset speech deficit. LKN was 1845 on Thursday when she spoke with family over the telephone. First noted to have a deficit today by neighbor when slurred speech was noted after she walked over to the neighbor's house. Of note, she did not use her cane that she normally uses, so there was a question of confusion. She has a history of CVA about 1 year ago with similar symptoms.   LSN: [phone] on Thursday tPA Given: No: On Eliquis   INTERVAL HISTORY  I have personally reviewed history of presenting illness in detail with the patient.  She states she had episode of disorientation and speech difficulties which lasted an hour or so and now appears to have improved.  She states she had somewhat similar episode about a year ago for which she had evaluation at [facility] as well.  She feels she is almost back to her baseline and has no complaints today.    OBJECTIVE Vitals:   01/01/19 0416 01/01/19 0444 01/01/19 0457 01/01/19 0810  BP:  (!) 164/80 138/86 (!) 131/59  Pulse: 72  80 (!) 50  Resp: 18  18 (!) 23  Temp:      TempSrc:      SpO2: 97%  96% 98%  Weight:      Height:        CBC:  Recent Labs  Lab 12/31/18 2103  WBC 7.1  NEUTROABS 4.7  HGB 12.7  HCT 39.6  MCV 101.3*  PLT 034    Basic Metabolic Panel:  Recent Labs  Lab 12/31/18 2103  NA 137  K 4.6  CL 103  CO2 24  GLUCOSE 165*  BUN 14  CREATININE 0.76  CALCIUM 9.2    Lipid Panel:     Component Value Date/Time   CHOL 187 01/01/2019 0615   TRIG 55 01/01/2019 0615   HDL 57 01/01/2019 0615   CHOLHDL 3.3 01/01/2019 0615   VLDL 11 01/01/2019 0615   LDLCALC 119 (H) 01/01/2019 0615   HgbA1c:  Lab Results  Component Value Date   HGBA1C 7.2 (H) 01/01/2019   Urine Drug Screen: No results found for: LABOPIA, COCAINSCRNUR,  LABBENZ, AMPHETMU, THCU, LABBARB  Alcohol Level No results found for: ETH  IMAGING  Ct Head Wo Contrast 12/31/2018 IMPRESSION:  No acute intracranial abnormality. Findings consistent with age related atrophy and chronic small vessel ischemia Right lacunar infarct involving the corona radiata   Mr Brain 21 Contrast Mr Angio Head Wo Contrast 01/01/2019 IMPRESSION: Truncated and severely motion degraded examination without acute ischemic infarct or proximal large vessel occlusion.   Transthoracic Echocardiogram  00/00/2020 Pending No results found for this or any previous visit (from the past 43800 hour(s)).  Bilateral Carotid Dopplers  00/00/2020 Pending   ECG - pending   PHYSICAL EXAM Blood pressure (!) 131/59, pulse (!) 50, temperature 98 F (36.7 C), temperature source Oral, resp. rate (!) 23, height 5\' 3"  (1.6 m), weight 74.8 kg, SpO2 98 %. Frail pleasant elderly Caucasian lady not in distress.  She is hard of hearing. . Afebrile. Head is nontraumatic. Neck is supple without bruit.    Cardiac exam no murmur or gallop. Lungs are clear to auscultation. Distal pulses are well felt.  Neurological Exam ;  Awake  Alert oriented x 3. Normal speech and  language except for slight hesitancy and disfluency.  No paraphasic errors.  Able to name and repeat well..eye movements full without nystagmus.fundi were not visualized. Vision acuity and fields appear normal. Hearing is diminished bilaterally.. Palatal movements are normal. Face symmetric. Tongue midline. Normal strength, tone, reflexes and coordination. Normal sensation. Gait deferred.      ASSESSMENT/PLAN Ms. Flara Storti Morgan is a 85 y.o. female with history of atrial fibrillation on Xarelto, DM, hypothyroidism, and a previous stroke, who was brought in by EMS for new onset speech deficit and possible confusion. She did not receive IV t-PA due to anticoagulation.  Possible TIA:  atrial fibrillation- embolic  Resultant no  deficits  CT head - No acute intracranial abnormality. Findings consistent with age related atrophy and chronic small vessel ischemia Right lacunar infarct involving the corona radiata   MRI head - Truncated and severely motion degraded examination without acute ischemic infarct or proximal large vessel occlusion.   MRA head - Truncated and severely motion degraded examination without acute ischemic infarct or proximal large vessel occlusion.   CTA H&N - not ordered  CT Perfusion - not ordered  Carotid Doppler -no significant extracranial stenosis.  2D Echo - pending  Lacey Jensen Virus 2 - negative  LDL - 119  HgbA1c - 7.2  UDS - not ordered  VTE prophylaxis - Xarelto Diet  Diet Order            Diet NPO time specified  Diet effective now              Xarelto (rivaroxaban) daily prior to admission, now on Xarelto (rivaroxaban) daily  Patient counseled to be compliant with her antithrombotic medications  Ongoing aggressive stroke risk factor management  Therapy recommendations:  pending  Disposition:  Pending  Hypertension  Home BP meds: Lisinopril 10 mg daily  Current BP meds: none  Stable . Permissive hypertension (OK if < 220/120) but gradually normalize in 5-7 days. . Long-term BP goal normotensive  Hyperlipidemia  Home Lipid lowering medication:  none  LDL 119, goal < 70  Current lipid lowering medication: None / NPO - Consider Lipitor 40 mg daily  Continue statin at discharge  Diabetes  Home diabetic meds: Amaryl 1.5 mg daily  Current diabetic meds: SSI  HgbA1c 7.2, goal < 7.0 No results for input(s): GLUCAP in the last 72 hours.  Other Stroke Risk Factors  Advanced age  Obesity, Body mass index is 29.21 kg/m., recommend weight loss, diet and exercise as appropriate   Hx stroke/TIA  Family hx stroke (mother)  Atrial fibrillation   Other Active Problems  B12 140 (low)  Elevated troponin levels - 14->23->26   Hospital  day # 0 I have personally obtained history,examined this patient, reviewed notes, independently viewed imaging studies, participated in medical decision making and plan of care.ROS completed by me personally and pertinent positives fully documented  I have made any additions or clarifications directly to the above note.  She presented with transient episode of disorientation, confusion and expressive speech language difficulties which appear to have improved.  She states she has similar episode in the past.  Differential includes left hemispheric TIA versus seizure.  Recommend check ongoing stroke continue work-up and EEG.  Continue Xarelto for her A. fib and maintain aggressive risk factor modification.  Discussed with patient and daughter and answered questions.  Discussed with Dr. Lunette Stands.  Greater than 50% time during this 35-minute visit was spent on counseling and coordination of care about TIA versus  seizure and discussion about evaluation and treatment plan and answering questions. Antony Contras, MD Medical Director [facility] Pager: [phone] 01/01/2019 12:58 PM   To contact Stroke Continuity provider, please refer to [email or web address] After hours, contact General Neurology

## 2019-01-01 NOTE — Progress Notes (Signed)
Pt refusing insulin. Educated patient about importance of insulin. But still refusing. States "I want my glimipiride". MD notified. Nurse will continue to follow up.

## 2019-01-01 NOTE — Progress Notes (Signed)
MD Vassiredy notified about pt troponin 26. Nurse will continue to monitor.

## 2019-01-01 NOTE — H&P (Signed)
History and Physical    Amanda [name] ZOX:[id number] DOB: [birth year] DOA: 12/31/2018  PCP: Mayra Neer, MD Patient coming from: Home  Chief Complaint: Slurred speech  HPI: Amanda [name] is a 85 y.o. female with medical history significant of atrial fibrillation on Xarelto, non-insulin-dependent diabetes mellitus, hypothyroidism, prior stroke presenting to the hospital for evaluation of slurred speech.  Last known normal 1845 on 8/13.  Daughter states that patient is very independent and lives on her own.  States yesterday evening the patient ran out of her house to the neighbor's house which is very unusual.  The neighbor noticed that patient was having difficulty speaking.  Daughter states she was not there at that time.  She does not have any other information.  States when she saw her mother at the hospital she was having difficulty speaking and expressing herself.  Daughter did not notice any focal weakness.  Patient is currently very somnolent after receiving Ativan for MRI.  No history could be obtained from her.  ED Course: Afebrile, blood pressure elevated.  White count 7.1, hemoglobin 12.7, platelet count 182,000.  Metabolic panel without significant electrolyte abnormalities.  INR 1.2.  High-sensitivity troponin 14 >23.  UA pending. Patient was seen by Dr. Cheral Marker from neurology.  Not a candidate for TPA due to anticoagulation.  Out of endovascular time window.  Head CT showing no acute intracranial abnormality.  Neurology recommended MRI and MRA of the brain without contrast.  Review of Systems:  All systems reviewed and apart from history of presenting illness, are negative.  Past Medical History:  Diagnosis Date   Carpal tunnel syndrome    Chronic anticoagulation    Chronic atrial fibrillation    Diabetes mellitus    NON-INSULIN   Hypothyroidism    Sinus congestion     Past Surgical History:  Procedure Laterality Date   APPENDECTOMY     CARDIOVASCULAR  STRESS TEST  07/10/2004   EF 81%   CHOLECYSTECTOMY     US ECHOCARDIOGRAPHY  12/06/2007   EF 55-60%     reports that she has never smoked. She has never used smokeless tobacco. She reports that she does not drink alcohol or use drugs.  Allergies  Allergen Reactions   Other Other (See Comments)    Calcium w/D causes constipation   Tape Other (See Comments)    SKIN IS VERY THIN AND WILL TEAR AND BRUISE EASILY!! PLEASE USE COBAN WRAP   Codeine Nausea Only   Hydrocodone Nausea Only   Metoprolol Nausea Only   Rythmol [Propafenone Hcl] Other (See Comments)    Dizziness    Shellfish Allergy Itching and Rash    MUSSELS (only)   Sulfa Antibiotics Nausea Only    Family History  Problem Relation Age of Onset   Stroke Mother    Hypertension Mother    Diabetes Sister    Hypertension Unknown    Heart attack Neg Hx     Prior to Admission medications   Medication Sig Start Date End Date Taking? Authorizing Provider  acetaminophen (TYLENOL) 500 MG tablet Take 1,000 mg by mouth every 6 (six) hours as needed (for pain).    [provider]  Arnica 20 % TINC Apply 1 application topically as directed.    [provider]  glimepiride (AMARYL) 1 MG tablet Take 1.5 mg by mouth daily with breakfast.     [provider]  levothyroxine (SYNTHROID, LEVOTHROID) 88 MCG tablet Take 88 mcg by mouth daily.  [provider]  lisinopril (PRINIVIL,ZESTRIL) 10 MG tablet Take 10 mg by mouth daily.      [provider]  meclizine (ANTIVERT) 25 MG tablet Take one every 6-8 hours as needed for dizziness 09/04/15   Milton Ferguson, MD  Multiple Vitamins-Minerals (PRESERVISION AREDS 2 PO) Take 1 capsule by mouth 2 (two) times daily.     [provider]  Omega-3 Fatty Acids (FISH OIL) 1000 MG CAPS Take 1 capsule by mouth every morning.    [provider]  ondansetron (ZOFRAN-ODT) 4 MG disintegrating tablet Take 4 mg by mouth every 4 (four)  hours as needed for nausea or vomiting.    [provider]  Probiotic Product (PROBIOTIC DAILY PO) Take 2 tablets by mouth daily as needed (DIGESTION).     [provider]  XARELTO 15 MG TABS tablet TAKE 1 TABLET(15 MG) BY MOUTH DAILY WITH SUPPER Patient taking differently: Take 15 mg by mouth daily.  09/22/18   Nahser, Wonda Cheng, MD    Physical Exam: Vitals:   01/01/19 0416 01/01/19 0444 01/01/19 0457 01/01/19 0810  BP:  (!) 164/80 138/86 (!) 131/59  Pulse: 72  80 (!) 50  Resp: 18  18 (!) 23  Temp:      TempSrc:      SpO2: 97%  96% 98%  Weight:      Height:        Physical Exam  Constitutional: No distress.  HENT:  Head: Normocephalic.  Eyes: Right eye exhibits no discharge. Left eye exhibits no discharge.  Unable to assess pupillary reaction due to lack of patient cooperation  Neck: Neck supple.  Cardiovascular: Normal rate, regular rhythm and intact distal pulses.  Pulmonary/Chest: Effort normal. No respiratory distress. She has no wheezes. She has no rales.  Abdominal: Soft. Bowel sounds are normal. She exhibits no distension. There is no abdominal tenderness. There is no guarding.  Musculoskeletal:        General: No edema.  Neurological:  Very somnolent, not answering questions or following commands Unable to do a neurologic exam at this time.  Skin: Skin is warm and dry. She is not diaphoretic.     Labs on Admission: I have personally reviewed following labs and imaging studies  CBC: Recent Labs  Lab 12/31/18 2103  WBC 7.1  NEUTROABS 4.7  HGB 12.7  HCT 39.6  MCV 101.3*  PLT 378   Basic Metabolic Panel: Recent Labs  Lab 12/31/18 2103  NA 137  K 4.6  CL 103  CO2 24  GLUCOSE 165*  BUN 14  CREATININE 0.76  CALCIUM 9.2   GFR: Estimated Creatinine Clearance: 38.1 mL/min (by C-G formula based on SCr of 0.76 mg/dL). Liver Function Tests: Recent Labs  Lab 12/31/18 2103  AST 22  ALT 15  ALKPHOS 96  BILITOT 0.6  PROT 6.7  ALBUMIN  3.6   No results for input(s): LIPASE, AMYLASE in the last 168 hours. No results for input(s): AMMONIA in the last 168 hours. Coagulation Profile: Recent Labs  Lab 12/31/18 2103  INR 1.2   Cardiac Enzymes: No results for input(s): CKTOTAL, CKMB, CKMBINDEX, TROPONINI in the last 168 hours. BNP (last 3 results) No results for input(s): PROBNP in the last 8760 hours. HbA1C: Recent Labs    01/01/19 0615  HGBA1C 7.2*   CBG: No results for input(s): GLUCAP in the last 168 hours. Lipid Profile: Recent Labs    01/01/19 0615  CHOL 187  HDL 57  LDLCALC 119*  TRIG 55  CHOLHDL 3.3   Thyroid Function Tests: No results for input(s): TSH, T4TOTAL, FREET4, T3FREE, THYROIDAB in the last 72 hours. Anemia Panel: No results for input(s): VITAMINB12, FOLATE, FERRITIN, TIBC, IRON, RETICCTPCT in the last 72 hours. Urine analysis:    Component Value Date/Time   COLORURINE YELLOW 09/11/2016 2144   APPEARANCEUR CLEAR 09/11/2016 2144   LABSPEC 1.015 09/11/2016 2144   PHURINE 6.0 09/11/2016 2144   GLUCOSEU NEGATIVE 09/11/2016 2144   HGBUR NEGATIVE 09/11/2016 2144   BILIRUBINUR NEGATIVE 09/11/2016 2144   KETONESUR 20 (A) 09/11/2016 2144   PROTEINUR NEGATIVE 09/11/2016 2144   UROBILINOGEN 0.2 05/23/2014 2023   NITRITE NEGATIVE 09/11/2016 2144   LEUKOCYTESUR NEGATIVE 09/11/2016 2144    Radiological Exams on Admission: Ct Head Wo Contrast  Result Date: 12/31/2018 CLINICAL DATA:  Focal neuro deficit EXAM: CT HEAD WITHOUT CONTRAST TECHNIQUE: Contiguous axial images were obtained from the base of the skull through the vertex without intravenous contrast. COMPARISON:  May 24, 2014 CT, MRI Sep 28, 2015 FINDINGS: Brain: No evidence of acute territorial infarction, hemorrhage, hydrocephalus,extra-axial collection or mass lesion/mass effect. There is dilatation the ventricles and sulci consistent with age-related atrophy. Low-attenuation changes in the deep white matter consistent with small  vessel ischemia. There is a lacunar infarct involving the right corona radiata adjacent to the caudate head. Vascular: No hyperdense vessel or unexpected calcification. Skull: The skull is intact. No fracture or focal lesion identified. Sinuses/Orbits: The visualized paranasal sinuses and mastoid air cells are clear. The orbits and globes intact. Other: None IMPRESSION: No acute intracranial abnormality. Findings consistent with age related atrophy and chronic small vessel ischemia Right lacunar infarct involving the corona radiata Electronically Signed   By: Prudencio Pair M.D.   On: 12/31/2018 23:05   Mr Angio Head Wo Contrast  Result Date: 01/01/2019 CLINICAL DATA:  Slurred speech and expressive aphasia. EXAM: MRI HEAD WITHOUT CONTRAST MRA HEAD WITHOUT CONTRAST TECHNIQUE: Multiplanar, multiecho pulse sequences of the brain and surrounding structures were obtained without intravenous contrast. Angiographic images of the head were obtained using MRA technique without contrast. COMPARISON:  Brain MRI 09/28/2015 FINDINGS: MRI HEAD FINDINGS The examination is markedly degraded by motion. Diffusion-weighted imaging shows no evidence of acute ischemia. There is generalized atrophy. No midline shift or other mass effect. No FLAIR sequence was obtained. No axial T1-weighted image was obtained. Midline structures are unremarkable. MRA HEAD FINDINGS Severely motion degraded. Within that limitation, there is no proximal large vessel occlusion. Both middle cerebral arteries are patent to the level of the M1 bifurcation. The basilar artery is of normal caliber. The visualized intracranial internal carotid arteries are normal. IMPRESSION: Truncated and severely motion degraded examination without acute ischemic infarct or proximal large vessel occlusion. Electronically Signed   By: Ulyses Jarred M.D.   On: 01/01/2019 03:16   Mr Brain Wo Contrast  Result Date: 01/01/2019 CLINICAL DATA:  Slurred speech and expressive  aphasia. EXAM: MRI HEAD WITHOUT CONTRAST MRA HEAD WITHOUT CONTRAST TECHNIQUE: Multiplanar, multiecho pulse sequences of the brain and surrounding structures were obtained without intravenous contrast. Angiographic images of the head were obtained using MRA technique without contrast. COMPARISON:  Brain MRI 09/28/2015 FINDINGS: MRI HEAD FINDINGS The examination is markedly degraded by motion. Diffusion-weighted imaging shows no evidence of acute ischemia. There is generalized atrophy. No midline shift or other mass effect. No FLAIR sequence was obtained. No axial T1-weighted image was obtained. Midline structures are unremarkable. MRA HEAD FINDINGS Severely motion degraded. Within that limitation, there is  no proximal large vessel occlusion. Both middle cerebral arteries are patent to the level of the M1 bifurcation. The basilar artery is of normal caliber. The visualized intracranial internal carotid arteries are normal. IMPRESSION: Truncated and severely motion degraded examination without acute ischemic infarct or proximal large vessel occlusion. Electronically Signed   By: Ulyses Jarred M.D.   On: 01/01/2019 03:16    EKG: Pending at this time.  Assessment/Plan Principal Problem:   Aphasia Active Problems:   Atrial fibrillation (HCC)   Diabetes mellitus type 2, controlled ([place])   HTN (hypertension)   Expressive and receptive aphasia Differential diagnosis includes stroke/TIA and toxic/metabolic encephalopathy. Stroke risk factors include A. fib, diabetes, and prior stroke.  Head CT negative.  Brain MRI/head MRA without acute ischemic infarct or proximal LVO, however, study truncated and severely motion degraded despite patient receiving Ativan. -Neurology following -Telemetry monitoring -Echo with bubble study -Modified permissive hypertension in the setting of advanced age.  Treat if SBP greater than 180. -Hemoglobin A1c, fasting lipid panel -Continue Xarelto -Frequent neurochecks -PT, OT,  speech therapy. -N.p.o. until cleared by bedside swallow evaluation or formal speech evaluation -UA pending -Check TSH, B12 levels  Hypertension -Allow permissive hypertension.  Treat if SBP greater than 180.  Atrial fibrillation -Currently rate controlled.  Not on any rate control agent at home.  Continue Xarelto for anticoagulation.  Non-insulin-dependent diabetes mellitus -Check A1c.  Sliding scale insulin sensitive and CBG checks.  Mildly elevated troponin -No history could be obtained as patient is currently somnolent.  High-sensitivity troponin 14 >23 >26.  EKG pending.  DVT prophylaxis: Lovenox Code Status: DNR.  Discussed at length with daughter at bedside. Family Communication: Daughter updated at bedside. Disposition Plan: Anticipate discharge after clinical improvement. Consults called: Neurology Admission status: It is my clinical opinion that referral for OBSERVATION is reasonable and necessary in this patient based on the above information provided. The aforementioned taken together are felt to place the patient at high risk for further clinical deterioration. However it is anticipated that the patient may be medically stable for discharge from the hospital within 24 to 48 hours.  The medical decision making on this patient was of high complexity and the patient is at high risk for clinical deterioration, therefore this is a level 3 visit.  Shela Leff MD [facility] Pager [phone]  If 7PM-7AM, please contact night-coverage [email or web address] Password [email or web address]  01/01/2019, 8:14 AM

## 2019-01-01 NOTE — Evaluation (Signed)
Physical Therapy Evaluation Patient Details Name: Amanda [name] MRN: [id number] DOB: 1918/12/23 Today's Date: 01/01/2019   History of Present Illness   Amanda [name] is a 85 y.o. female with medical history significant of atrial fibrillation on Xarelto, non-insulin-dependent diabetes mellitus, hypothyroidism, prior stroke presenting to the hospital for evaluation of slurred speech.    Clinical Impression  Pt admitted with above. Pt oriented however with delayed processing. Pt received ativan for CT/MRI which could be contributing to delayed responses. Pt was indep and living alone PTA. Spoke with daughter who is going to speak with family to see what support they can provide. If 24/7 assist can not be provided pt will need ST-SNF to achieve safe mod I level of function as she is requiring assist for all transfers and ambulation at this time. Pt also with noted bilat LE edema and erythema. RN made aware. Acute PT to cont to follow.    Follow Up Recommendations Home health PT;Supervision/Assistance - 24 hour    Equipment Recommendations  Rolling walker with 5" wheels    Recommendations for Other Services       Precautions / Restrictions Precautions Precautions: Fall Restrictions Weight Bearing Restrictions: No      Mobility  Bed Mobility Overal bed mobility: Needs Assistance Bed Mobility: Supine to Sit;Sit to Supine     Supine to sit: Min assist Sit to supine: Mod assist   General bed mobility comments: assist for bilat LE and trunk elevation, max directional verbal cues  Transfers Overall transfer level: Needs assistance Equipment used: 2 person hand held assist Transfers: Sit to/from Stand Sit to Stand: Min assist         General transfer comment: verbal cues, pt guarded and cautious  Ambulation/Gait Ambulation/Gait assistance: Min assist;+2 physical assistance Gait Distance (Feet): 40 Feet Assistive device: 2 person hand held assist Gait  Pattern/deviations: Step-through pattern;Decreased stride length;Shuffle Gait velocity: slow Gait velocity interpretation: <1.8 ft/sec, indicate of risk for recurrent falls General Gait Details: pt slow and guarded, required max encouragement to ambulate further. Pt initially wouldn't clear either foot but then progressed to low foot clearance  Stairs            Wheelchair Mobility    Modified Rankin (Stroke Patients Only) Modified Rankin (Stroke Patients Only) Pre-Morbid Rankin Score: No significant disability Modified Rankin: Moderate disability     Balance Overall balance assessment: Needs assistance Sitting-balance support: Feet unsupported;Bilateral upper extremity supported Sitting balance-Leahy Scale: Fair Sitting balance - Comments: ER cot was to high for patients feet to touch   Standing balance support: Bilateral upper extremity supported Standing balance-Leahy Scale: Fair Standing balance comment: dependent on physical assist                             Pertinent Vitals/Pain Pain Assessment: No/denies pain    Home Living Family/patient expects to be discharged to:: Private residence Living Arrangements: Alone Available Help at Discharge: Family;Available PRN/intermittently(family discussing if they can provide 24/7 assist) Type of Home: House Home Access: Stairs to enter Entrance Stairs-Rails: Left Entrance Stairs-Number of Steps: 3 Home Layout: Multi-level Home Equipment: Shower seat;Grab bars - toilet;Cane - single point      Prior Function Level of Independence: Independent         Comments: pt hasn't driven in 2 years, since [place] have been delivered, and then recently family has been taking her out, pt cooks for herself. Someone does supervise her bathing  Hand Dominance   Dominant Hand: Right    Extremity/Trunk Assessment   Upper Extremity Assessment Upper Extremity Assessment: LUE deficits/detail LUE Deficits /  Details: appears weaker than the R when asked to raise simultanesouly but MMT equal to the R    Lower Extremity Assessment Lower Extremity Assessment: Generalized weakness(noted bilat LE edeam)    Cervical / Trunk Assessment Cervical / Trunk Assessment: Kyphotic  Communication   Communication: HOH  Cognition Arousal/Alertness: Awake/alert Behavior During Therapy: WFL for tasks assessed/performed Overall Cognitive Status: Impaired/Different from baseline Area of Impairment: Problem solving                             Problem Solving: Slow processing;Requires verbal cues;Requires tactile cues General Comments: pt with delayed processing however received ativan for imaging procedures since she couldn't stay still      General Comments General comments (skin integrity, edema, etc.): pt with noted bilat LE edema R worse than the L with noted red errythemia. Pitting around the ankles. Pt with pain to touch when pressure applied to ankles. RN made aware    Exercises     Assessment/Plan    PT Assessment Patient needs continued PT services  PT Problem List Decreased strength;Decreased range of motion;Decreased activity tolerance;Decreased balance;Decreased mobility;Decreased coordination;Decreased cognition;Decreased knowledge of use of DME;Decreased safety awareness;Decreased knowledge of precautions       PT Treatment Interventions DME instruction;Gait training;Stair training;Functional mobility training;Therapeutic activities;Therapeutic exercise;Balance training;Neuromuscular re-education;Cognitive remediation;Patient/family education    PT Goals (Current goals can be found in the Care Plan section)  Acute Rehab PT Goals Patient Stated Goal: didn't state PT Goal Formulation: With patient/family Time For Goal Achievement: 01/15/19 Potential to Achieve Goals: Good    Frequency Min 4X/week   Barriers to discharge Decreased caregiver support pt lives alone, unsure if  family can provide 24/7 assist    Co-evaluation               AM-PAC PT "6 Clicks" Mobility  Outcome Measure Help needed turning from your back to your side while in a flat bed without using bedrails?: A Little Help needed moving from lying on your back to sitting on the side of a flat bed without using bedrails?: A Little Help needed moving to and from a bed to a chair (including a wheelchair)?: A Little Help needed standing up from a chair using your arms (e.g., wheelchair or bedside chair)?: A Little Help needed to walk in hospital room?: A Little Help needed climbing 3-5 steps with a railing? : A Lot 6 Click Score: 17    End of Session Equipment Utilized During Treatment: Gait belt Activity Tolerance: Patient tolerated treatment well Patient left: in bed;with call bell/phone within reach;with nursing/sitter in room;with family/visitor present Nurse Communication: Mobility status PT Visit Diagnosis: Unsteadiness on feet (R26.81);Muscle weakness (generalized) (M62.81);Difficulty in walking, not elsewhere classified (R26.2)    Time: 4196-2229 PT Time Calculation (min) (ACUTE ONLY): 30 min   Charges:   PT Evaluation $PT Eval Moderate Complexity: 1 Mod PT Treatments $Gait Training: 8-22 mins        Kittie Plater, PT, DPT Acute Rehabilitation Services Pager #: [phone] Office #: [phone]   Berline Lopes 01/01/2019, 2:54 PM

## 2019-01-01 NOTE — Progress Notes (Signed)
VASCULAR LAB PRELIMINARY  PRELIMINARY  PRELIMINARY  PRELIMINARY  Carotid duplex completed.    Preliminary report:  See CV proc for preliminary results.  Verania Salberg, RVT 01/01/2019, 10:50 AM

## 2019-01-01 NOTE — ED Notes (Signed)
Called by MRI because pt was continuously moving in the scanner. Hospitalist gave verbal orders to give another .5 of Ativan

## 2019-01-01 NOTE — ED Notes (Signed)
Nurse will draw labs. 

## 2019-01-02 ENCOUNTER — Observation Stay (HOSPITAL_BASED_OUTPATIENT_CLINIC_OR_DEPARTMENT_OTHER): Payer: Medicare Other

## 2019-01-02 ENCOUNTER — Encounter (HOSPITAL_COMMUNITY): Payer: Self-pay | Admitting: *Deleted

## 2019-01-02 DIAGNOSIS — Z66 Do not resuscitate: Secondary | ICD-10-CM | POA: Diagnosis present

## 2019-01-02 DIAGNOSIS — R471 Dysarthria and anarthria: Secondary | ICD-10-CM | POA: Diagnosis present

## 2019-01-02 DIAGNOSIS — I340 Nonrheumatic mitral (valve) insufficiency: Secondary | ICD-10-CM

## 2019-01-02 DIAGNOSIS — Z20828 Contact with and (suspected) exposure to other viral communicable diseases: Secondary | ICD-10-CM | POA: Diagnosis present

## 2019-01-02 DIAGNOSIS — I4819 Other persistent atrial fibrillation: Secondary | ICD-10-CM | POA: Diagnosis not present

## 2019-01-02 DIAGNOSIS — Z79899 Other long term (current) drug therapy: Secondary | ICD-10-CM | POA: Diagnosis not present

## 2019-01-02 DIAGNOSIS — I4821 Permanent atrial fibrillation: Secondary | ICD-10-CM | POA: Diagnosis present

## 2019-01-02 DIAGNOSIS — Z882 Allergy status to sulfonamides status: Secondary | ICD-10-CM | POA: Diagnosis not present

## 2019-01-02 DIAGNOSIS — R4701 Aphasia: Secondary | ICD-10-CM | POA: Diagnosis present

## 2019-01-02 DIAGNOSIS — Z7901 Long term (current) use of anticoagulants: Secondary | ICD-10-CM | POA: Diagnosis not present

## 2019-01-02 DIAGNOSIS — I361 Nonrheumatic tricuspid (valve) insufficiency: Secondary | ICD-10-CM | POA: Diagnosis not present

## 2019-01-02 DIAGNOSIS — E039 Hypothyroidism, unspecified: Secondary | ICD-10-CM | POA: Diagnosis present

## 2019-01-02 DIAGNOSIS — Z885 Allergy status to narcotic agent status: Secondary | ICD-10-CM | POA: Diagnosis not present

## 2019-01-02 DIAGNOSIS — I69351 Hemiplegia and hemiparesis following cerebral infarction affecting right dominant side: Secondary | ICD-10-CM | POA: Diagnosis not present

## 2019-01-02 DIAGNOSIS — E119 Type 2 diabetes mellitus without complications: Secondary | ICD-10-CM | POA: Diagnosis present

## 2019-01-02 DIAGNOSIS — Z91013 Allergy to seafood: Secondary | ICD-10-CM | POA: Diagnosis not present

## 2019-01-02 DIAGNOSIS — E1121 Type 2 diabetes mellitus with diabetic nephropathy: Secondary | ICD-10-CM | POA: Diagnosis not present

## 2019-01-02 DIAGNOSIS — Z833 Family history of diabetes mellitus: Secondary | ICD-10-CM | POA: Diagnosis not present

## 2019-01-02 DIAGNOSIS — E538 Deficiency of other specified B group vitamins: Secondary | ICD-10-CM | POA: Diagnosis present

## 2019-01-02 DIAGNOSIS — I480 Paroxysmal atrial fibrillation: Secondary | ICD-10-CM | POA: Diagnosis not present

## 2019-01-02 DIAGNOSIS — I10 Essential (primary) hypertension: Secondary | ICD-10-CM | POA: Diagnosis present

## 2019-01-02 DIAGNOSIS — Z823 Family history of stroke: Secondary | ICD-10-CM | POA: Diagnosis not present

## 2019-01-02 DIAGNOSIS — Z79890 Hormone replacement therapy (postmenopausal): Secondary | ICD-10-CM | POA: Diagnosis not present

## 2019-01-02 DIAGNOSIS — Z8249 Family history of ischemic heart disease and other diseases of the circulatory system: Secondary | ICD-10-CM | POA: Diagnosis not present

## 2019-01-02 LAB — ECHOCARDIOGRAM COMPLETE
Height: 63 in
Weight: 2638.47 oz

## 2019-01-02 LAB — GLUCOSE, CAPILLARY
Glucose-Capillary: 144 mg/dL — ABNORMAL HIGH (ref 70–99)
Glucose-Capillary: 175 mg/dL — ABNORMAL HIGH (ref 70–99)
Glucose-Capillary: 197 mg/dL — ABNORMAL HIGH (ref 70–99)
Glucose-Capillary: 180 mg/dL — ABNORMAL HIGH (ref 70–99)

## 2019-01-02 MED ORDER — ATORVASTATIN CALCIUM 10 MG PO TABS
20.0000 mg | ORAL_TABLET | Freq: Every day | ORAL | Status: DC
  Filled 2019-01-02 (×2): qty 2

## 2019-01-02 NOTE — Progress Notes (Signed)
Physical Therapy Treatment Patient Details Name: Amanda [name] MRN: [id number] DOB: 1918/10/17 Today's Date: 01/02/2019    History of Present Illness  Amanda [name] is a 85 y.o. female with medical history significant of atrial fibrillation on Xarelto, non-insulin-dependent diabetes mellitus, hypothyroidism, prior stroke presenting to the hospital for evaluation of slurred speech.      PT Comments    Pt ambulated 115 feet HH/min assist of 1. She reports family able to provide 24-hour assist at home. DME recs updated as pt reports she has a RW at home. PT to continue to mobilize in acute care.   Follow Up Recommendations  Home health PT;Supervision/Assistance - 24 hour     Equipment Recommendations  None recommended by PT    Recommendations for Other Services       Precautions / Restrictions Precautions Precautions: Fall    Mobility  Bed Mobility Overal bed mobility: Needs Assistance       Supine to sit: Min assist     General bed mobility comments: +rail, increased time  Transfers Overall transfer level: Needs assistance Equipment used: 1 person hand held assist Transfers: Sit to/from Stand Sit to Stand: Min assist         General transfer comment: assist to power up  Ambulation/Gait Ambulation/Gait assistance: Min assist Gait Distance (Feet): 115 Feet Assistive device: 1 person hand held assist Gait Pattern/deviations: Step-through pattern;Decreased stride length Gait velocity: decreased Gait velocity interpretation: <1.31 ft/sec, indicative of household ambulator General Gait Details: slow guarded gait   Stairs             Wheelchair Mobility    Modified Rankin (Stroke Patients Only) Modified Rankin (Stroke Patients Only) Pre-Morbid Rankin Score: No significant disability Modified Rankin: Moderate disability     Balance Overall balance assessment: Needs assistance Sitting-balance support: Feet supported;No upper extremity  supported Sitting balance-Leahy Scale: Fair     Standing balance support: Single extremity supported;During functional activity Standing balance-Leahy Scale: Fair                              Cognition Arousal/Alertness: Awake/alert Behavior During Therapy: WFL for tasks assessed/performed Overall Cognitive Status: Impaired/Different from baseline Area of Impairment: Problem solving                             Problem Solving: Slow processing;Requires verbal cues;Requires tactile cues        Exercises      General Comments        Pertinent Vitals/Pain Pain Assessment: No/denies pain    Home Living                      Prior Function            PT Goals (current goals can now be found in the care plan section) Acute Rehab PT Goals Patient Stated Goal: home PT Goal Formulation: With patient/family Time For Goal Achievement: 01/15/19 Potential to Achieve Goals: Good Progress towards PT goals: Progressing toward goals    Frequency    Min 4X/week      PT Plan Equipment recommendations need to be updated    Co-evaluation              AM-PAC PT "6 Clicks" Mobility   Outcome Measure  Help needed turning from your back to your side while in a flat bed without using bedrails?:  A Little Help needed moving from lying on your back to sitting on the side of a flat bed without using bedrails?: A Little Help needed moving to and from a bed to a chair (including a wheelchair)?: A Little Help needed standing up from a chair using your arms (e.g., wheelchair or bedside chair)?: A Little Help needed to walk in hospital room?: A Little Help needed climbing 3-5 steps with a railing? : A Lot 6 Click Score: 17    End of Session Equipment Utilized During Treatment: Gait belt Activity Tolerance: Patient tolerated treatment well Patient left: in chair;with call bell/phone within reach Nurse Communication: Mobility status PT Visit  Diagnosis: Unsteadiness on feet (R26.81);Muscle weakness (generalized) (M62.81);Difficulty in walking, not elsewhere classified (R26.2)     Time: 7517-0017 PT Time Calculation (min) (ACUTE ONLY): 25 min  Charges:  $Gait Training: 23-37 mins                     Lorrin Goodell, [place]  Office # [phone] Pager [phone]    Lorriane Shire 01/02/2019, 9:34 AM

## 2019-01-02 NOTE — Evaluation (Signed)
Occupational Therapy Evaluation Patient Details Name: Amanda [name] MRN: [id number] DOB: 22-Mar-1919 Today's Date: 01/02/2019    History of Present Illness  Amanda [name] is a 85 y.o. female with medical history significant of atrial fibrillation on Xarelto, non-insulin-dependent diabetes mellitus, hypothyroidism, prior stroke presenting to the hospital for evaluation of slurred speech.     Clinical Impression   PTA, pt was living alone and had assistance for showering as well as cleaning. Her family assists with driving and she goes shopping with them and out to restaurants. She utilized a cane for mobility. Pt currently limited in activity tolerance as well as overall strength. She required min guard assist for toilet and shower transfers today and demonstrates instability during functional mobility. She would benefit from continued OT services while admitted and recommend home health OT services post-acute D/C as well as 24 hour supervision initially from family.     Follow Up Recommendations  Home health OT;Supervision/Assistance - 24 hour    Equipment Recommendations  None recommended by OT    Recommendations for Other Services       Precautions / Restrictions Precautions Precautions: Fall Restrictions Weight Bearing Restrictions: No      Mobility Bed Mobility Overal bed mobility: Needs Assistance       Supine to sit: Min assist     General bed mobility comments: OOB in recliner on my arrival  Transfers Overall transfer level: Needs assistance Equipment used: 1 person hand held assist Transfers: Sit to/from Stand Sit to Stand: Min guard         General transfer comment: Guarding assist for safety. Attempting to power up twice before successful.     Balance Overall balance assessment: Needs assistance Sitting-balance support: Feet supported;No upper extremity supported Sitting balance-Leahy Scale: Fair     Standing balance support: No upper  extremity supported;During functional activity;Single extremity supported;Bilateral upper extremity supported Standing balance-Leahy Scale: Fair Standing balance comment: Using furniture for support during dynamic tasks.                            ADL either performed or assessed with clinical judgement   ADL Overall ADL's : Needs assistance/impaired Eating/Feeding: Set up;Sitting   Grooming: Supervision/safety;Standing   Upper Body Bathing: Set up;Sitting   Lower Body Bathing: Supervison/ safety;Sit to/from stand   Upper Body Dressing : Set up;Sitting   Lower Body Dressing: Supervision/safety;Sit to/from stand   Toilet Transfer: Min guard;Ambulation;Grab bars Toilet Transfer Details (indicate cue type and reason): pt unsteady Toileting- Clothing Manipulation and Hygiene: Sit to/from stand;Supervision/safety Toileting - Clothing Manipulation Details (indicate cue type and reason): using grab bars Tub/ Shower Transfer: Min guard;Ambulation;Grab bars Tub/Shower Transfer Details (indicate cue type and reason): Pt requiring close hands on guarding assist for safety.  Functional mobility during ADLs: Min guard General ADL Comments: Pt unsteady on her feet and with decreased mobility overall. Reports feeling "woozy."     Vision Patient Visual Report: No change from baseline Vision Assessment?: No apparent visual deficits     Perception     Praxis      Pertinent Vitals/Pain Pain Assessment: No/denies pain     Hand Dominance Right   Extremity/Trunk Assessment Upper Extremity Assessment Upper Extremity Assessment: Generalized weakness   Lower Extremity Assessment Lower Extremity Assessment: Generalized weakness       Communication Communication Communication: HOH   Cognition Arousal/Alertness: Awake/alert Behavior During Therapy: WFL for tasks assessed/performed Overall Cognitive Status: Impaired/Different from baseline  Area of Impairment: Problem  solving                             Problem Solving: Slow processing;Requires verbal cues;Requires tactile cues General Comments: Pt with some slow processing.    General Comments  Pt's daughter present and engaged in session.     Exercises     Shoulder Instructions      Home Living Family/patient expects to be discharged to:: Private residence Living Arrangements: Alone Available Help at Discharge: Family;Available PRN/intermittently Type of Home: House Home Access: Stairs to enter [employer] of Steps: 3 Entrance Stairs-Rails: Left Home Layout: Multi-level(split level) Alternate Level Stairs-Number of Steps: 6   Bathroom Shower/Tub: Occupational psychologist: Standard     Home Equipment: Shower seat;Grab bars - toilet;Cane - single point      Lives With: Alone    Prior Functioning/Environment Level of Independence: Independent with assistive device(s);Needs assistance  Gait / Transfers Assistance Needed: Uses cane intermittently ADL's / Homemaking Assistance Needed: Daughter assisting with showers; has hired help for cleaning   Comments: Goes shopping and out to eat with family. Decline in mobility since COVID and decreased opportunities for outings.         OT Problem List: Decreased strength;Decreased activity tolerance;Impaired balance (sitting and/or standing);Decreased safety awareness;Decreased knowledge of use of DME or AE;Decreased knowledge of precautions      OT Treatment/Interventions: Self-care/ADL training;Therapeutic exercise;Energy conservation;DME and/or AE instruction;Therapeutic activities;Patient/family education;Balance training    OT Goals(Current goals can be found in the care plan section) Acute Rehab OT Goals Patient Stated Goal: home OT Goal Formulation: With patient/family Time For Goal Achievement: 01/16/19 Potential to Achieve Goals: Good ADL Goals Pt Will Perform Grooming: Independently;standing Pt  Will Perform Upper Body Dressing: Independently;sitting Pt Will Perform Lower Body Dressing: Independently;sit to/from stand Pt Will Transfer to Toilet: Independently;ambulating(handicapped height/raised seat) Pt Will Perform Toileting - Clothing Manipulation and hygiene: Independently;sit to/from stand Pt Will Perform Tub/Shower Transfer: with supervision;ambulating;Shower transfer;grab bars;shower seat  OT Frequency: Min 2X/week   Barriers to D/C:            Co-evaluation              AM-PAC OT "6 Clicks" Daily Activity     Outcome Measure Help from another person eating meals?: None Help from another person taking care of personal grooming?: A Little Help from another person toileting, which includes using toliet, bedpan, or urinal?: A Little Help from another person bathing (including washing, rinsing, drying)?: A Little Help from another person to put on and taking off regular upper body clothing?: None Help from another person to put on and taking off regular lower body clothing?: A Little 6 Click Score: 20   End of Session    Activity Tolerance: Patient tolerated treatment well Patient left: in chair;with call bell/phone within reach(with SLP as well as MD present)  OT Visit Diagnosis: Other abnormalities of gait and mobility (R26.89)                Time: 1004-1040 OT Time Calculation (min): 36 min Charges:  OT General Charges $OT Visit: 1 Visit OT Evaluation $OT Eval Moderate Complexity: 1 Mod OT Treatments $Self Care/Home Management : 8-22 mins  [place] A Tahliyah Anagnos 01/02/2019, 10:52 AM

## 2019-01-02 NOTE — TOC Initial Note (Signed)
Transition of Care [facility]) - Initial/Assessment Note    Patient Details  Name: Amanda [name] MRN: [id number] Date of Birth: [birth year]  [facility] CM/SW Contact:    Carles Collet, RN Phone Number: 01/02/2019, 9:30 AM  Clinical Narrative:              Spoke w patient and PT at bedside. She is declined HH. She will have support of her family at DC who will rotate staying with her. CM will continue to follow     Expected Discharge Plan: Home/Self Care Barriers to Discharge: Continued Medical Work up   Patient Goals and CMS Choice Patient states their goals for this hospitalization and ongoing recovery are:: to return home      Expected Discharge Plan and Services Expected Discharge Plan: Home/Self Care                                   HH Arranged: Patient Refused HH          Prior Living Arrangements/Services   Lives with:: Self                   Activities of Daily Living Home Assistive Devices/Equipment: Cane (specify quad or straight), Grab bars in shower ADL Screening (condition at time of admission) Patient's cognitive ability adequate to safely complete daily activities?: Yes Is the patient deaf or have difficulty hearing?: Yes Does the patient have difficulty seeing, even when wearing glasses/contacts?: No Does the patient have difficulty concentrating, remembering, or making decisions?: No Patient able to express need for assistance with ADLs?: Yes Does the patient have difficulty dressing or bathing?: Yes Independently performs ADLs?: Yes (appropriate for developmental age) Does the patient have difficulty walking or climbing stairs?: No Weakness of Legs: Both Weakness of Arms/Hands: None  Permission Sought/Granted                  Emotional Assessment              Admission diagnosis:  Expressive aphasia [R47.01] Patient Active Problem List   Diagnosis Date Noted  . Aphasia 01/01/2019  . HTN (hypertension)  01/01/2019  . 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 Anemia 09/28/2015  . Vertigo   . Chronic anticoagulation 05/24/2014  . History of CVA (cerebrovascular accident) 05/23/2014  . Permanent atrial fibrillation 05/23/2014  . Diabetes mellitus type 2, controlled ([place]) 05/23/2014  . Slurred speech   . Encounter for therapeutic drug monitoring 08/19/2013  . Benign hypertensive heart disease without heart failure 10/09/2010  . Hypothyroidism 10/09/2010  . Atrial fibrillation ([place]) 09/03/2010   PCP:  Mayra Neer, MD Pharmacy:   [facility] [place], [address] [place] [address] Phone: [phone] Fax: [phone]     Social Determinants of Health (SDOH) Interventions    Readmission Risk Interventions No flowsheet data found.

## 2019-01-02 NOTE — Care Management Obs Status (Signed)
[place] NOTIFICATION   Patient Details  Name: Amanda [name] MRN: [id number] Date of Birth: [birth year]   Medicare Observation Status Notification Given:  Yes    Carles Collet, RN 01/02/2019, 9:29 AM

## 2019-01-02 NOTE — Progress Notes (Addendum)
PROGRESS NOTE    Amanda [name]  XNT:[id number] DOB: 05-Jun-1918 DOA: 12/31/2018 PCP: Mayra Neer, MD   Brief Narrative: 85 year old female with history of atrial fibrillation on Xarelto, non-insulin-dependent diabetes mellitus, hypothyroidism, previous stroke with slurred speech, right hemiplegia was no normal until 12/30/2018 1845.  Patient at baseline is independent of ADL IADL.  Patient went to neighbor's house, found to be confused with a difficulty speaking with expressive aphasia.  Did not have any other focal weakness.  Work-up in the emergency department no obvious signs of infection are noted.  CT head without contrast did not show any acute abnormality.  Patient underwent MRI, MRA of the brain showed poor quality to motion, however did not show any acute stroke.  Evaluated by neurology, recommends EEG to rule out the possibility of seizures causing her significant confusion, neuro deficits.  ##Expressive aphasia sudden onset -MRI showed negative for acute stroke -However neurology is concerned about possible seizures  -Ordered EEG-pending -Carotid Dopplers-no stenosis. -No arrhythmias are noted on telemetry -Evaluated by physical therapy, recommended home health with rehab.  ##Atrial fibrillation permanent -Not on any rate controlling medications -Continue with Xarelto  ##Diabetes mellitus -Follow-up on sliding scale insulin -Holding glimepiride while in the hospital  ##Hypertension -Controlled    Principal Problem:   Aphasia Active Problems:   Atrial fibrillation (HCC)   Diabetes mellitus type 2, controlled ([place])   HTN (hypertension)     DVT prophylaxis: Xarelto Code Status: Full code Family Communication: Daughter  disposition Plan: Based on physical therapy elevation   Consultants:   Neurology  Procedures: Carotid Dopplers    Subjective: Patient is sitting in the chair.  He denies having any complaints.  Family states improved dysarthria from  the time of the admission.  Objective: Vitals:   01/01/19 1919 01/02/19 0023 01/02/19 0421 01/02/19 0822  BP: (!) 148/55 (!) 161/58 (!) 168/90   Pulse: (!) 52 (!) 59 68 94  Resp: 18 17 16 20   Temp: 98 F (36.7 C) 98.1 F (36.7 C) 98 F (36.7 C) 97.7 F (36.5 C)  TempSrc: Oral Oral  Oral  SpO2: 96% 98% 96% 93%  Weight:      Height:        Intake/Output Summary (Last 24 hours) at 01/02/2019 1229 Last data filed at 01/02/2019 0421 Gross per 24 hour  Intake --  Output 750 ml  Net -750 ml   Filed Weights   12/31/18 2059  Weight: 74.8 kg    Examination:  General exam: Appears calm and comfortable  Respiratory system: Clear to auscultation. Respiratory effort normal. Cardiovascular system: S1 & S2 heard, RRR. No JVD, murmurs, rubs, gallops or clicks. No pedal edema. Gastrointestinal system: Abdomen is nondistended, soft and nontender. No organomegaly or masses felt. Normal bowel sounds heard. Central nervous system: Alert and oriented. No focal neurological deficits.  Mild dysarthria Extremities: Symmetric 5 x 5 power. Skin: No rashes, lesions or ulcers Psychiatry: Judgement and insight appear normal. Mood & affect appropriate.     Data Reviewed: I have personally reviewed following labs and imaging studies  CBC: Recent Labs  Lab 12/31/18 2103  WBC 7.1  NEUTROABS 4.7  HGB 12.7  HCT 39.6  MCV 101.3*  PLT 967   Basic Metabolic Panel: Recent Labs  Lab 12/31/18 2103  NA 137  K 4.6  CL 103  CO2 24  GLUCOSE 165*  BUN 14  CREATININE 0.76  CALCIUM 9.2   GFR: Estimated Creatinine Clearance: 38.1 mL/min (by C-G formula based  on SCr of 0.76 mg/dL). Liver Function Tests: Recent Labs  Lab 12/31/18 2103  AST 22  ALT 15  ALKPHOS 96  BILITOT 0.6  PROT 6.7  ALBUMIN 3.6   No results for input(s): LIPASE, AMYLASE in the last 168 hours. No results for input(s): AMMONIA in the last 168 hours. Coagulation Profile: Recent Labs  Lab 12/31/18 2103  INR 1.2    Cardiac Enzymes: No results for input(s): CKTOTAL, CKMB, CKMBINDEX, TROPONINI in the last 168 hours. BNP (last 3 results) No results for input(s): PROBNP in the last 8760 hours. HbA1C: Recent Labs    01/01/19 0615  HGBA1C 7.2*   CBG: Recent Labs  Lab 01/01/19 1707 01/01/19 1921 01/01/19 2127 01/02/19 0610 01/02/19 1147  GLUCAP 230* 269* 213* 144* 175*   Lipid Profile: Recent Labs    01/01/19 0615  CHOL 187  HDL 57  LDLCALC 119*  TRIG 55  CHOLHDL 3.3   Thyroid Function Tests: Recent Labs    01/01/19 0815  TSH 0.716   Anemia Panel: Recent Labs    01/01/19 0815  VITAMINB12 140*   Sepsis Labs: No results for input(s): PROCALCITON, LATICACIDVEN in the last 168 hours.  Recent Results (from the past 240 hour(s))  SARS CORONAVIRUS 2 Nasal Swab Aptima Multi Swab     Status: None   Collection Time: 01/01/19  4:00 AM   Specimen: Aptima Multi Swab; Nasal Swab  Result Value Ref Range Status   SARS Coronavirus 2 NEGATIVE NEGATIVE Final    Comment: (NOTE) SARS-CoV-2 target nucleic acids are NOT DETECTED. The SARS-CoV-2 RNA is generally detectable in upper and lower respiratory specimens during the acute phase of infection. Negative results do not preclude SARS-CoV-2 infection, do not rule out co-infections with other pathogens, and should not be used as the sole basis for treatment or other patient management decisions. Negative results must be combined with clinical observations, patient history, and epidemiological information. The expected result is Negative. Fact Sheet for Patients: SugarRoll.be Fact Sheet for Healthcare Providers: https://www.woods-mathews.com/ This test is not yet approved or cleared by the [place] FDA and  has been authorized for detection and/or diagnosis of SARS-CoV-2 by FDA under an Emergency Use Authorization (EUA). This EUA will remain  in effect (meaning this test can be used) for the  duration of the COVID-19 declaration under Section 56 4(b)(1) of the Act, 21 U.S.C. section 360bbb-3(b)(1), unless the authorization is terminated or revoked sooner. Performed at [facility], [place] [address], [place], [place] [address]          Radiology Studies: Ct Head Wo Contrast  Result Date: 12/31/2018 CLINICAL DATA:  Focal neuro deficit EXAM: CT HEAD WITHOUT CONTRAST TECHNIQUE: Contiguous axial images were obtained from the base of the skull through the vertex without intravenous contrast. COMPARISON:  May 24, 2014 CT, MRI Sep 28, 2015 FINDINGS: Brain: No evidence of acute territorial infarction, hemorrhage, hydrocephalus,extra-axial collection or mass lesion/mass effect. There is dilatation the ventricles and sulci consistent with age-related atrophy. Low-attenuation changes in the deep white matter consistent with small vessel ischemia. There is a lacunar infarct involving the right corona radiata adjacent to the caudate head. Vascular: No hyperdense vessel or unexpected calcification. Skull: The skull is intact. No fracture or focal lesion identified. Sinuses/Orbits: The visualized paranasal sinuses and mastoid air cells are clear. The orbits and globes intact. Other: None IMPRESSION: No acute intracranial abnormality. Findings consistent with age related atrophy and chronic small vessel ischemia Right lacunar infarct involving the corona  radiata Electronically Signed   By: Prudencio Pair M.D.   On: 12/31/2018 23:05   Mr Angio Head Wo Contrast  Result Date: 01/01/2019 CLINICAL DATA:  Slurred speech and expressive aphasia. EXAM: MRI HEAD WITHOUT CONTRAST MRA HEAD WITHOUT CONTRAST TECHNIQUE: Multiplanar, multiecho pulse sequences of the brain and surrounding structures were obtained without intravenous contrast. Angiographic images of the head were obtained using MRA technique without contrast. COMPARISON:  Brain MRI 09/28/2015 FINDINGS: MRI HEAD FINDINGS The examination is  markedly degraded by motion. Diffusion-weighted imaging shows no evidence of acute ischemia. There is generalized atrophy. No midline shift or other mass effect. No FLAIR sequence was obtained. No axial T1-weighted image was obtained. Midline structures are unremarkable. MRA HEAD FINDINGS Severely motion degraded. Within that limitation, there is no proximal large vessel occlusion. Both middle cerebral arteries are patent to the level of the M1 bifurcation. The basilar artery is of normal caliber. The visualized intracranial internal carotid arteries are normal. IMPRESSION: Truncated and severely motion degraded examination without acute ischemic infarct or proximal large vessel occlusion. Electronically Signed   By: Ulyses Jarred M.D.   On: 01/01/2019 03:16   Mr Brain Wo Contrast  Result Date: 01/01/2019 CLINICAL DATA:  Slurred speech and expressive aphasia. EXAM: MRI HEAD WITHOUT CONTRAST MRA HEAD WITHOUT CONTRAST TECHNIQUE: Multiplanar, multiecho pulse sequences of the brain and surrounding structures were obtained without intravenous contrast. Angiographic images of the head were obtained using MRA technique without contrast. COMPARISON:  Brain MRI 09/28/2015 FINDINGS: MRI HEAD FINDINGS The examination is markedly degraded by motion. Diffusion-weighted imaging shows no evidence of acute ischemia. There is generalized atrophy. No midline shift or other mass effect. No FLAIR sequence was obtained. No axial T1-weighted image was obtained. Midline structures are unremarkable. MRA HEAD FINDINGS Severely motion degraded. Within that limitation, there is no proximal large vessel occlusion. Both middle cerebral arteries are patent to the level of the M1 bifurcation. The basilar artery is of normal caliber. The visualized intracranial internal carotid arteries are normal. IMPRESSION: Truncated and severely motion degraded examination without acute ischemic infarct or proximal large vessel occlusion. Electronically  Signed   By: Ulyses Jarred M.D.   On: 01/01/2019 03:16   Vas US Carotid  Result Date: 01/01/2019 Carotid Arterial Duplex Study Indications:       Speech disturbance. Risk Factors:      Diabetes, prior CVA. Other Factors:     Atrial fibrillation, on Xarelto. Limitations:       Bradycardia Comparison Study:  No prior study on file for comparison Performing Technologist: Sharion Dove RVS  Examination Guidelines: A complete evaluation includes B-mode imaging, spectral Doppler, color Doppler, and power Doppler as needed of all accessible portions of each vessel. Bilateral testing is considered an integral part of a complete examination. Limited examinations for reoccurring indications may be performed as noted.  Right Carotid Findings: +----------+--------+--------+--------+--------+------------------+             PSV cm/s EDV cm/s Stenosis Describe Comments            +----------+--------+--------+--------+--------+------------------+  CCA Prox   73       7                          intimal thickening  +----------+--------+--------+--------+--------+------------------+  CCA Distal 67       11  intimal thickening  +----------+--------+--------+--------+--------+------------------+  ICA Prox   69       13                calcific                     +----------+--------+--------+--------+--------+------------------+  ICA Distal 73       12                                             +----------+--------+--------+--------+--------+------------------+  ECA        117      6                                              +----------+--------+--------+--------+--------+------------------+ +----------+--------+-------+--------+-------------------+             PSV cm/s EDV cms Describe Arm Pressure (mmHG)  +----------+--------+-------+--------+-------------------+  Subclavian 80                                             +----------+--------+-------+--------+-------------------+  +---------+--------+--+--------+--+  Vertebral PSV cm/s 42 EDV cm/s 12  +---------+--------+--+--------+--+  Left Carotid Findings: +----------+--------+--------+--------+------------+------------------+             PSV cm/s EDV cm/s Stenosis Describe     Comments            +----------+--------+--------+--------+------------+------------------+  CCA Prox   114      16                             intimal thickening  +----------+--------+--------+--------+------------+------------------+  CCA Distal 109      11                             intimal thickening  +----------+--------+--------+--------+------------+------------------+  ICA Prox   60       12                heterogenous                     +----------+--------+--------+--------+------------+------------------+  ICA Distal 45       11                                                 +----------+--------+--------+--------+------------+------------------+  ECA        92       6                                                  +----------+--------+--------+--------+------------+------------------+ +----------+--------+--------+--------+-------------------+  Subclavian PSV cm/s EDV cm/s Describe Arm Pressure (mmHG)  +----------+--------+--------+--------+-------------------+             119                                             +----------+--------+--------+--------+-------------------+ +---------+--------+--+--------+-+  Vertebral PSV cm/s 35 EDV cm/s 8  +---------+--------+--+--------+-+  Summary: Right Carotid: The extracranial vessels were near-normal with only minimal wall                thickening or plaque. Left Carotid: The extracranial vessels were near-normal with only minimal wall               thickening or plaque. Vertebrals:  Bilateral vertebral arteries demonstrate antegrade flow. Subclavians: Normal flow hemodynamics were seen in bilateral subclavian              arteries. *See table(s) above for measurements and observations.   Electronically signed by Antony Contras MD on 01/01/2019 at 12:46:36 PM.    Final         Scheduled Meds:   stroke: mapping our early stages of recovery book   Does not apply Once   atorvastatin  20 mg Oral q1800   insulin aspart  0-9 Units Subcutaneous TID WC   Rivaroxaban  15 mg Oral QAC supper   Continuous Infusions:   LOS: 0 days    Time spent: 10 minutes   Amanda Weisgerber, MD [facility] Pager [phone]  If 7PM-7AM, please contact night-coverage [email or web address] Password [facility] 01/02/2019, 12:29 PM

## 2019-01-02 NOTE — Evaluation (Signed)
Speech Language Pathology Evaluation Patient Details Name: Amanda [name] MRN: [id number] DOB: [birth year] Today's Date: 01/02/2019 Time: 6213-0865 SLP Time Calculation (min) (ACUTE ONLY): 27 min  Problem List:  Patient Active Problem List   Diagnosis Date Noted  . Aphasia 01/01/2019  . HTN (hypertension) 01/01/2019  . 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 Anemia 09/28/2015  . Vertigo   . Chronic anticoagulation 05/24/2014  . History of CVA (cerebrovascular accident) 05/23/2014  . Permanent atrial fibrillation 05/23/2014  . Diabetes mellitus type 2, controlled ([place]) 05/23/2014  . Slurred speech   . Encounter for therapeutic drug monitoring 08/19/2013  . Benign hypertensive heart disease without heart failure 10/09/2010  . Hypothyroidism 10/09/2010  . Atrial fibrillation ([place]) 09/03/2010   Past Medical History:  Past Medical History:  Diagnosis Date  . Carpal tunnel syndrome   . Chronic anticoagulation   . Chronic atrial fibrillation   . Diabetes mellitus    NON-INSULIN  . Hypothyroidism   . Sinus congestion    Past Surgical History:  Past Surgical History:  Procedure Laterality Date  . APPENDECTOMY    . CARDIOVASCULAR STRESS TEST  07/10/2004   EF 81%  . CHOLECYSTECTOMY    . US ECHOCARDIOGRAPHY  12/06/2007   EF 55-60%   HPI:  Pt is a 85 y.o. female with medical history significant of atrial fibrillation, non-insulin-dependent diabetes mellitus, hypothyroidism, prior stroke who presented to the hospital for evaluation of slurred speech and difficulty expressing herself. Per medical record, patient is very independent and lives on her own. MRI of the brain was negative for acute ischemic infarct or proximal large vessel occlusion.   Assessment / Plan / Recommendation Clinical Impression  Pt participated in speech/language evaluation with her daughter present. Both parties denied the pt having any acute deficits in speech or language. Pt's daughter  reported that the pt had been living independently prior to admission without any speech/language deficits. She also denied any baseline cognitive-linguistic deficits but reported the the pt's processing speed has always been slow and that she sometimes has difficulty processing complex information.   Her speech and language skills are currently within functional limits; however, mild articulatory imprecision, and difficulty with auditory comprehension of 3-step commands as well as paragraph-level material was noted. Mild difficulty with attention and memory was demonstrated. However, pt's daughter indicated that the pt's overall performance during the evaluation was representation of her baseline. Further acute skilled SLP services are therefore not clinically indicated at this time. Pt, nursing, and her daughter were educated regarding this and all parties verbalized understanding as well as agreement with plan of care.    SLP Assessment  SLP Recommendation/Assessment: Patient does not need any further Speech Lanaguage Pathology Services SLP Visit Diagnosis: Cognitive communication deficit (R41.841);Aphasia (R47.01)    Follow Up Recommendations  None    Frequency and Duration           SLP Evaluation Cognition  Overall Cognitive Status: History of cognitive impairments - at baseline Arousal/Alertness: Awake/alert Orientation Level: Oriented X4 Attention: Focused;Sustained Focused Attention: Appears intact Sustained Attention: Appears intact Memory: Impaired Memory Impairment: Retrieval deficit;Storage deficit;Decreased recall of new information(Immediate: 3/3; Delayed: 1/3; with cues: 2/2) Problem Solving: Appears intact(For situational problems related to safety)       Comprehension  Auditory Comprehension Overall Auditory Comprehension: Appears within functional limits for tasks assessed Yes/No Questions: Within Functional Limits Basic Biographical Questions: (5/5) Complex  Questions: (5/5) Paragraph Comprehension (via yes/no questions): (1/4) Commands: Impaired Two Step Basic  Commands: (4/4) Multistep Basic Commands: (0/3; Pt's daughter reported this is normal for pt) Conversation: Complex    Expression Expression Primary Mode of Expression: Verbal Verbal Expression Overall Verbal Expression: Appears within functional limits for tasks assessed Initiation: No impairment Automatic Speech: Month of year(WNL) Level of Generative/Spontaneous Verbalization: Conversation Repetition: No impairment Naming: No impairment Responsive: (5/5) Confrontation: Within functional limits(10/10) Pragmatics: No impairment Written Expression Dominant Hand: Right   Oral / Motor  Oral Motor/Sensory Function Overall Oral Motor/Sensory Function: Within functional limits Motor Speech Overall Motor Speech: Appears within functional limits for tasks assessed Respiration: Within functional limits Phonation: Normal Resonance: Within functional limits Articulation: Impaired Level of Impairment: Conversation Intelligibility: Intelligible Motor Planning: Witnin functional limits Motor Speech Errors: Not applicable   Kaytlyn Din I. Hardin Negus, [place], [place] Office number [phone] Pager [phone]                    Horton Marshall 01/02/2019, 11:21 AM

## 2019-01-02 NOTE — Progress Notes (Signed)
  Echocardiogram 2D Echocardiogram has been performed.  Amanda [name] 01/02/2019, 1:48 PM

## 2019-01-02 NOTE — Progress Notes (Signed)
RN educated pt on the importance of taking Lipitor. Pt refused to take medication and states she wants to talk with her PCP first before taking medication. RN will continue to educate on the importance of taking medication.

## 2019-01-02 NOTE — Progress Notes (Signed)
STROKE TEAM PROGRESS NOTE      INTERVAL HISTORY Patient is sitting up in a bedside chair resting comfortably.  No new changes.  Echo and EEG are pending.  Carotid ultrasound showed no significant extracranial stenosis    OBJECTIVE Vitals:   01/01/19 1919 01/02/19 0023 01/02/19 0421 01/02/19 0822  BP: (!) 148/55 (!) 161/58 (!) 168/90   Pulse: (!) 52 (!) 59 68 94  Resp: 18 17 16 20   Temp: 98 F (36.7 C) 98.1 F (36.7 C) 98 F (36.7 C) 97.7 F (36.5 C)  TempSrc: Oral Oral  Oral  SpO2: 96% 98% 96% 93%  Weight:      Height:        CBC:  Recent Labs  Lab 12/31/18 2103  WBC 7.1  NEUTROABS 4.7  HGB 12.7  HCT 39.6  MCV 101.3*  PLT 932    Basic Metabolic Panel:  Recent Labs  Lab 12/31/18 2103  NA 137  K 4.6  CL 103  CO2 24  GLUCOSE 165*  BUN 14  CREATININE 0.76  CALCIUM 9.[name]    Lipid Panel:     Component Value Date/Time   CHOL 187 01/01/2019 0615   TRIG 55 01/01/2019 0615   HDL 57 01/01/2019 0615   CHOLHDL 3.3 01/01/2019 0615   VLDL 11 01/01/2019 0615   LDLCALC 119 (H) 01/01/2019 0615   HgbA1c:  Lab Results  Component Value Date   HGBA1C 7.[name] (H) 01/01/2019   Urine Drug Screen: No results found for: LABOPIA, COCAINSCRNUR, LABBENZ, AMPHETMU, THCU, LABBARB  Alcohol Level No results found for: ETH  IMAGING  Ct Head Wo Contrast 12/31/2018 IMPRESSION:  No acute intracranial abnormality. Findings consistent with age related atrophy and chronic small vessel ischemia Right lacunar infarct involving the corona radiata   Mr Brain 90 Contrast Mr Angio Head Wo Contrast 01/01/2019 IMPRESSION: Truncated and severely motion degraded examination without acute ischemic infarct or proximal large vessel occlusion.   Transthoracic Echocardiogram  00/00/2020 Pending No results found for this or any previous visit (from the past 43800 hour(s)).  Bilateral Carotid Dopplers  01/01/2019 Summary: Right Carotid: The extracranial vessels were near-normal with only  minimal wall thickening or plaque. Left Carotid: The extracranial vessels were near-normal with only minimal wall thickening or plaque. Vertebrals:  Bilateral vertebral arteries demonstrate antegrade flow. Subclavians: Normal flow hemodynamics were seen in bilateral subclavian arteries.   ECG - pending  EEG - pending    PHYSICAL EXAM Blood pressure (!) 168/90, pulse 94, temperature 97.7 F (36.5 C), temperature source Oral, resp. rate 20, height 5\' 3"  (1.6 m), weight 74.8 kg, SpO2 93 %. Frail pleasant elderly Caucasian lady not in distress.  She is hard of hearing. . Afebrile. Head is nontraumatic. Neck is supple without bruit.    Cardiac exam no murmur or gallop. Lungs are clear to auscultation. Distal pulses are well felt.  Neurological Exam ;  Awake  Alert oriented x 3. Normal speech and language except for slight hesitancy and disfluency.  No paraphasic errors.  Able to name and repeat well..eye movements full without nystagmus.fundi were not visualized. Vision acuity and fields appear normal. Hearing is diminished bilaterally.. Palatal movements are normal. Face symmetric. Tongue midline. Normal strength, tone, reflexes and coordination. Normal sensation. Gait deferred.    ASSESSMENT/PLAN [name] is a 85 y.o. female with history of atrial fibrillation on Xarelto, DM, hypothyroidism, and a previous stroke, who was brought in by EMS for new onset speech deficit and possible confusion.  She did not receive IV t-PA due to anticoagulation.  Possible TIA:  atrial fibrillation- embolic  Resultant no deficits  CT head - No acute intracranial abnormality. Findings consistent with age related atrophy and chronic small vessel ischemia Right lacunar infarct involving the corona radiata   MRI head - Truncated and severely motion degraded examination without acute ischemic infarct or proximal large vessel occlusion.   MRA head - Truncated and severely motion degraded  examination without acute ischemic infarct or proximal large vessel occlusion.   CTA H&N - not ordered  CT Perfusion - not ordered  EEG - pending  Carotid Doppler - no significant extracranial stenosis.  2D Echo - pending  Amanda [name] - negative  LDL - 119  HgbA1c - 7.[name]  UDS - not ordered  VTE prophylaxis - Xarelto Diet  Diet Order            DIET SOFT Room service appropriate? Yes; Fluid consistency: Thin  Diet effective now              Xarelto (rivaroxaban) daily prior to admission, now on Xarelto (rivaroxaban) daily  Patient counseled to be compliant with her antithrombotic medications  Ongoing aggressive stroke risk factor management  Therapy recommendations:  HH PT and OT recommended  Disposition:  Pending  Hypertension  Home BP meds: Lisinopril 10 mg daily  Current BP meds: none  Stable . Permissive hypertension (OK if < 220/120) but gradually normalize in 5-7 days. . Long-term BP goal normotensive  Hyperlipidemia  Home Lipid lowering medication:  none  LDL 119, goal < 70  Current lipid lowering medication: Lipitor 20 mg daily added  Continue statin at discharge  Diabetes  Home diabetic meds: Amaryl 1.5 mg daily  Current diabetic meds: SSI  HgbA1c 7.[name], goal < 7.0 Recent Labs    01/01/19 2127 01/02/19 0610 01/02/19 1147  GLUCAP 213* 144* 175*    Other Stroke Risk Factors  Advanced age  Obesity, Body mass index is 29.21 kg/m., recommend weight loss, diet and exercise as appropriate   Hx stroke/TIA  Family hx stroke (mother)  Atrial fibrillation   Other Active Problems  B12 140 (low)  Elevated troponin levels - 14->23->26   Hospital day # 0    She presented with transient episode of disorientation, confusion and expressive speech language difficulties which appear to have improved.  She states she has similar episode in the past.  Differential includes left hemispheric TIA versus seizure.  Recommend check  echocardiogram and EEG.  Continue Xarelto for her A. fib and maintain aggressive risk factor modification.  Discussed with patient  and answered questions.  Discussed with Dr. Lunette Stands.  Stroke team will sign off.  Follow-up as an outpatient with stroke clinic in 6 weeks. Antony Contras, MD  To contact Stroke Continuity provider, please refer to [email or web address] After hours, contact General Neurology

## 2019-01-03 ENCOUNTER — Inpatient Hospital Stay (HOSPITAL_COMMUNITY): Payer: Medicare Other

## 2019-01-03 DIAGNOSIS — I480 Paroxysmal atrial fibrillation: Secondary | ICD-10-CM

## 2019-01-03 DIAGNOSIS — E1121 Type 2 diabetes mellitus with diabetic nephropathy: Secondary | ICD-10-CM

## 2019-01-03 DIAGNOSIS — I10 Essential (primary) hypertension: Secondary | ICD-10-CM

## 2019-01-03 LAB — GLUCOSE, CAPILLARY
Glucose-Capillary: 140 mg/dL — ABNORMAL HIGH (ref 70–99)
Glucose-Capillary: 209 mg/dL — ABNORMAL HIGH (ref 70–99)
Glucose-Capillary: 250 mg/dL — ABNORMAL HIGH (ref 70–99)
Glucose-Capillary: 177 mg/dL — ABNORMAL HIGH (ref 70–99)

## 2019-01-03 MED ORDER — CYANOCOBALAMIN 1000 MCG/ML IJ SOLN
1000.0000 ug | Freq: Once | INTRAMUSCULAR | Status: AC
  Administered 2019-01-03: 12:00:00 1000 ug via INTRAMUSCULAR
  Filled 2019-01-03: qty 1

## 2019-01-03 MED ORDER — CYANOCOBALAMIN 1000 MCG/ML IJ SOLN: 1000.0000 ug | Freq: Once | INTRAMUSCULAR | Status: DC

## 2019-01-03 MED ORDER — ATORVASTATIN CALCIUM 20 MG PO TABS: 20.0000 mg | ORAL_TABLET | Freq: Every day | ORAL | 0 refills | Status: DC

## 2019-01-03 MED ORDER — LISINOPRIL 10 MG PO TABS
10.0000 mg | ORAL_TABLET | Freq: Every day | ORAL | Status: DC
  Administered 2019-01-03 (×2): 10 mg via ORAL
  Filled 2019-01-03 (×2): qty 1

## 2019-01-03 NOTE — Progress Notes (Signed)
PROGRESS NOTE    Amanda [name]  JQB:[id number] DOB: [birth year] DOA: 12/31/2018 PCP: Mayra Neer, MD   Brief Narrative: 85 year old female with history of atrial fibrillation on Xarelto, non-insulin-dependent diabetes mellitus, hypothyroidism, previous stroke with slurred speech, right hemiplegia was no normal until 12/30/2018 1845.  Patient at baseline is independent of ADL IADL.  Patient went to neighbor's house, found to be confused with a difficulty speaking with expressive aphasia.  Did not have any other focal weakness.  Work-up in the emergency department no obvious signs of infection are noted.  CT head without contrast did not show any acute abnormality.  Patient underwent MRI, MRA of the brain showed poor quality to motion, however did not show any acute stroke.  Evaluated by neurology, recommends EEG to rule out the possibility of seizures causing her significant confusion, neuro deficits.  ##Expressive aphasia sudden onset -MRI showed negative for acute stroke -However neurology is concerned about possible seizures  -Ordered EEG-pending -Carotid Dopplers-no stenosis. -No arrhythmias are noted on telemetry  - echocardiogram-EF of 60 to 65%, no valvular abnormalities -Evaluated by physical therapy, recommended home health with rehab.  ##Atrial fibrillation permanent -Not on any rate controlling medications -Continue with Xarelto  Vitamin B12 deficiency -Admitted with B12 levels of 140 -B12 thousand micrograms IM daily  ##Diabetes mellitus -Follow-up on sliding scale insulin -Holding glimepiride while in the hospital  ##Hypertension -Controlled    Principal Problem:   Aphasia Active Problems:   Atrial fibrillation (HCC)   Diabetes mellitus type 2, controlled ([place])   HTN (hypertension)   Dysarthria     DVT prophylaxis: Xarelto Code Status: Full code Family Communication: Son at bedside  disposition Plan: Home with home health  Consultants:    Neurology  Procedures: Carotid Dopplers Echocardiogram EEG-pending    Subjective: Patient is sitting in the chair.  He denies having any complaints.  Family states improved dysarthria from the time of the admission.  Objective: Vitals:   01/03/19 0023 01/03/19 0351 01/03/19 0825 01/03/19 1158  BP: (!) 175/80 (!) 167/67 (!) 157/122 (!) 188/80  Pulse:  80 77 (!) 55  Resp:  18 20 20   Temp:  97.7 F (36.5 C) 98 F (36.7 C) 98.2 F (36.8 C)  TempSrc:  Oral Oral Oral  SpO2:  97% 97% 100%  Weight:      Height:       No intake or output data in the 24 hours ending 01/03/19 1307 Filed Weights   12/31/18 2059  Weight: 74.8 kg    Examination:  General exam: Appears calm and comfortable, sitting in the chair Respiratory system: Clear to auscultation. Respiratory effort normal. Cardiovascular system: S1 & S2 heard, RRR. No JVD, murmurs, rubs, gallops or clicks. No pedal edema. Gastrointestinal system: Abdomen is nondistended, soft and nontender. No organomegaly or masses felt. Normal bowel sounds heard. Central nervous system: Alert and oriented. No focal neurological deficits.  Proved to dysarthria  extremities: Symmetric 5 x 5 power. Skin: No rashes, lesions or ulcers Psychiatry: Judgement and insight appear normal. Mood & affect appropriate.     Data Reviewed: I have personally reviewed following labs and imaging studies  CBC: Recent Labs  Lab 12/31/18 2103  WBC 7.1  NEUTROABS 4.7  HGB 12.7  HCT 39.6  MCV 101.3*  PLT 409   Basic Metabolic Panel: Recent Labs  Lab 12/31/18 2103  NA 137  K 4.6  CL 103  CO2 24  GLUCOSE 165*  BUN 14  CREATININE 0.76  CALCIUM 9.2  GFR: Estimated Creatinine Clearance: 38.1 mL/min (by C-G formula based on SCr of 0.76 mg/dL). Liver Function Tests: Recent Labs  Lab 12/31/18 2103  AST 22  ALT 15  ALKPHOS 96  BILITOT 0.6  PROT 6.7  ALBUMIN 3.6   No results for input(s): LIPASE, AMYLASE in the last 168 hours. No results  for input(s): AMMONIA in the last 168 hours. Coagulation Profile: Recent Labs  Lab 12/31/18 2103  INR 1.2   Cardiac Enzymes: No results for input(s): CKTOTAL, CKMB, CKMBINDEX, TROPONINI in the last 168 hours. BNP (last 3 results) No results for input(s): PROBNP in the last 8760 hours. HbA1C: Recent Labs    01/01/19 0615  HGBA1C 7.2*   CBG: Recent Labs  Lab 01/02/19 1147 01/02/19 1552 01/02/19 2118 01/03/19 0607 01/03/19 1151  GLUCAP 175* 197* 180* 140* 209*   Lipid Profile: Recent Labs    01/01/19 0615  CHOL 187  HDL 57  LDLCALC 119*  TRIG 55  CHOLHDL 3.3   Thyroid Function Tests: Recent Labs    01/01/19 0815  TSH 0.716   Anemia Panel: Recent Labs    01/01/19 0815  VITAMINB12 140*   Sepsis Labs: No results for input(s): PROCALCITON, LATICACIDVEN in the last 168 hours.  Recent Results (from the past 240 hour(s))  SARS CORONAVIRUS 2 Nasal Swab Aptima Multi Swab     Status: None   Collection Time: 01/01/19  4:00 AM   Specimen: Aptima Multi Swab; Nasal Swab  Result Value Ref Range Status   SARS Coronavirus 2 NEGATIVE NEGATIVE Final    Comment: (NOTE) SARS-CoV-2 target nucleic acids are NOT DETECTED. The SARS-CoV-2 RNA is generally detectable in upper and lower respiratory specimens during the acute phase of infection. Negative results do not preclude SARS-CoV-2 infection, do not rule out co-infections with other pathogens, and should not be used as the sole basis for treatment or other patient management decisions. Negative results must be combined with clinical observations, patient history, and epidemiological information. The expected result is Negative. Fact Sheet for Patients: SugarRoll.be Fact Sheet for Healthcare Providers: https://www.woods-mathews.com/ This test is not yet approved or cleared by the [place] FDA and  has been authorized for detection and/or diagnosis of SARS-CoV-2 by FDA under  an Emergency Use Authorization (EUA). This EUA will remain  in effect (meaning this test can be used) for the duration of the COVID-19 declaration under Section 56 4(b)(1) of the Act, 21 U.S.C. section 360bbb-3(b)(1), unless the authorization is terminated or revoked sooner. Performed at [facility], [place] [address] [place], [place] [address]          Radiology Studies: No results found.      Scheduled Meds: .  stroke: mapping our early stages of recovery book   Does not apply Once  . atorvastatin  20 mg Oral q1800  . insulin aspart  0-9 Units Subcutaneous TID WC  . lisinopril  10 mg Oral Daily  . Rivaroxaban  15 mg Oral QAC supper   Continuous Infusions:   LOS: 1 day    Time spent: 35 minutes   Mikeal Winstanley, MD [facility] Pager [phone]  If 7PM-7AM, please contact night-coverage [email or web address] Password [email or web address] 01/03/2019, 1:07 PM

## 2019-01-03 NOTE — Progress Notes (Signed)
Occupational Therapy Treatment + Discharge Summary Patient Details Name: MACLAINE AHOLA MRN: [id number] DOB: [birth year] Today's Date: 01/03/2019    History of present illness  VIANCA BRACHER is a 85 y.o. female with medical history significant of atrial fibrillation on Xarelto, non-insulin-dependent diabetes mellitus, hypothyroidism, prior stroke presenting to the hospital for evaluation of slurred speech.  MRI and CT are negative for stroke.  EEG is pending.    OT comments  Pt steady on her feet, using RW. Pt performing own LB dressing. Pt performing own grooming at 10 mins of standing at sink and performing her own toilet hygiene with [place]. Pt using RW for mobility, but son reports that she will not use one at home. Pt's son in room and reports pt looks as though she is at her functional baseline. Family is very attentive and pt's daughter is present for weekly shower as pt bathes at sink daily. Tub transfer simulation and pt able to step over tub height with minguardA. Pt does not require additional OT as pt has reached her highest rehab potential. Goals met. OT signing of.    Follow Up Recommendations  No OT follow up;Supervision - Intermittent    Equipment Recommendations  None recommended by OT    Recommendations for Other Services      Precautions / Restrictions Precautions Precautions: Fall Precaution Comments: h/o falls Restrictions Weight Bearing Restrictions: No       Mobility Bed Mobility               General bed mobility comments: In recliner  Transfers Overall transfer level: Needs assistance Equipment used: Rolling walker (2 wheeled) Transfers: Sit to/from Stand Sit to Stand: Min guard         General transfer comment: Min guard assist for safety.  Cues for hand placement to sit/stand from low commode and recliner chair.     Balance Overall balance assessment: Needs assistance Sitting-balance support: Feet supported;No upper  extremity supported Sitting balance-Leahy Scale: Fair     Standing balance support: Bilateral upper extremity supported;Single extremity supported Standing balance-Leahy Scale: Fair                             ADL either performed or assessed with clinical judgement   ADL Overall ADL's : At baseline     Grooming: Supervision/safety;Standing Grooming Details (indicate cue type and reason): Pt stood x10 mins with supervisionA for grooming tasks with no physical assist provided.                             Functional mobility during ADLs: Supervision/safety;Rolling walker General ADL Comments: Pt steady on her feet, using RW. Pt performing own LB dressing. Pt's son in room and reports pt looks as though she is at her functional baseline. Family is very attentive and pt's daughter is present for weekly shower as pt bathes at sink daily. Pt performing own toilet hygiene with [place].      Vision   Vision Assessment?: No apparent visual deficits   Perception     Praxis      Cognition Arousal/Alertness: Awake/alert Behavior During Therapy: WFL for tasks assessed/performed Overall Cognitive Status: History of cognitive impairments - at baseline Area of Impairment: Awareness;Safety/judgement                         Safety/Judgement: Decreased awareness of safety;Decreased  awareness of deficits Awareness: Emergent   General Comments: Pt is very HOH which may affect her processing abilities (I don't think she is hearing everything even with hearing aids in).         Exercises     Shoulder Instructions       General Comments Pt declined my offer for her to use a RW, stating she uses a cane at home at baseline.     Pertinent Vitals/ Pain       Pain Assessment: Faces Faces Pain Scale: Hurts a little bit Pain Location: chronic left knee pain Pain Descriptors / Indicators: Grimacing;Guarding Pain Intervention(s): Monitored during  session  Home Living                                          Prior Functioning/Environment              Frequency           Progress Toward Goals  OT Goals(current goals can now be found in the care plan section)  Progress towards OT goals: Progressing toward goals  Acute Rehab OT Goals Patient Stated Goal: home OT Goal Formulation: With patient/family Time For Goal Achievement: 01/16/19 Potential to Achieve Goals: Good ADL Goals Pt Will Perform Grooming: Independently;standing Pt Will Perform Upper Body Dressing: Independently;sitting Pt Will Perform Lower Body Dressing: Independently;sit to/from stand Pt Will Transfer to Toilet: Independently;ambulating Pt Will Perform Toileting - Clothing Manipulation and hygiene: Independently;sit to/from stand Pt Will Perform Tub/Shower Transfer: with supervision;ambulating;Shower transfer;grab bars;shower seat  Plan Discharge plan needs to be updated;All goals met and education completed, patient discharged from OT services    Co-evaluation                 AM-PAC OT "6 Clicks" Daily Activity     Outcome Measure   Help from another person eating meals?: None Help from another person taking care of personal grooming?: A Little Help from another person toileting, which includes using toliet, bedpan, or urinal?: A Little Help from another person bathing (including washing, rinsing, drying)?: A Little Help from another person to put on and taking off regular upper body clothing?: None Help from another person to put on and taking off regular lower body clothing?: A Little 6 Click Score: 20    End of Session Equipment Utilized During Treatment: Gait belt;Rolling walker  OT Visit Diagnosis: Other abnormalities of gait and mobility (R26.89)   Activity Tolerance Patient tolerated treatment well   Patient Left Other (comment)(up with PT)   Nurse Communication Mobility status        Time:  1856-3149 OT Time Calculation (min): 14 min  Charges: OT General Charges $OT Visit: 1 Visit OT Treatments $Self Care/Home Management : 8-22 mins  Darryl Nestle) Marsa Aris OTR/L Acute Rehabilitation Services Pager: [phone] Office: [place] 01/03/2019, 3:30 PM

## 2019-01-03 NOTE — Progress Notes (Signed)
Physical Therapy Treatment Patient Details Name: Amanda [name] MRN: [id number] DOB: 1918/12/26 Today's Date: 01/03/2019    History of Present Illness  Amanda [name] is a 85 y.o. female with medical history significant of atrial fibrillation on Xarelto, non-insulin-dependent diabetes mellitus, hypothyroidism, prior stroke presenting to the hospital for evaluation of slurred speech.  MRI and CT are negative for stroke.  EEG is pending.     PT Comments    Step son was present for our session, reporting that she is close to baseline and that she is fiercely independent.  She was able to walk with min hand held assist a good distance into the hallway, reporting that she uses a cane and politely refusing to try my RW. PT will continue to follow acutely for safe mobility progression with plans to try cane and stairs next session if she does not d/c home today.    Follow Up Recommendations  Home health PT;Supervision/Assistance - 24 hour     Equipment Recommendations  None recommended by PT    Recommendations for Other Services   NA     Precautions / Restrictions Precautions Precautions: Fall Precaution Comments: h/o falls    Mobility  Bed Mobility               General bed mobility comments: Pt was OOB standing at the sink with the OT.    Transfers Overall transfer level: Needs assistance Equipment used: 1 person hand held assist Transfers: Sit to/from Stand Sit to Stand: Min guard         General transfer comment: Min guard assist for safety.  Cues for hand placement to sit/stand from low commode and recliner chair.   Ambulation/Gait Ambulation/Gait assistance: Min assist Gait Distance (Feet): 100 Feet Assistive device: 1 person hand held assist Gait Pattern/deviations: Step-through pattern;Antalgic;Staggering right;Staggering left     General Gait Details: Pt with mildly staggering gait pattern, cues for safety.  Pt reports injury to left knee from a fall  that never got "quite right".        Modified Rankin (Stroke Patients Only) Modified Rankin (Stroke Patients Only) Pre-Morbid Rankin Score: No symptoms Modified Rankin: Moderately severe disability     Balance Overall balance assessment: Needs assistance Sitting-balance support: Feet supported;No upper extremity supported Sitting balance-Leahy Scale: Fair     Standing balance support: Bilateral upper extremity supported;Single extremity supported Standing balance-Leahy Scale: Fair                              Cognition Arousal/Alertness: Awake/alert Behavior During Therapy: WFL for tasks assessed/performed Overall Cognitive Status: History of cognitive impairments - at baseline Area of Impairment: Awareness;Problem solving;Safety/judgement                         Safety/Judgement: Decreased awareness of safety;Decreased awareness of deficits Awareness: Emergent   General Comments: Pt is very HOH which may affect her processing abilities (I don't think she is hearing everything even with hearing aids in).          General Comments General comments (skin integrity, edema, etc.): Pt declined my offer for her to use a RW, stating she uses a cane at home at baseline.       Pertinent Vitals/Pain Pain Assessment: Faces Faces Pain Scale: Hurts little more Pain Location: chronic left knee pain Pain Descriptors / Indicators: Grimacing;Guarding Pain Intervention(s): Limited activity within patient's tolerance;Monitored during session;Repositioned  PT Goals (current goals can now be found in the care plan section) Acute Rehab PT Goals Patient Stated Goal: home Progress towards PT goals: Progressing toward goals    Frequency    Min 3X/week      PT Plan Frequency needs to be updated       AM-PAC PT "6 Clicks" Mobility   Outcome Measure  Help needed turning from your back to your side while in a flat bed without using bedrails?: A  Little Help needed moving from lying on your back to sitting on the side of a flat bed without using bedrails?: A Little Help needed moving to and from a bed to a chair (including a wheelchair)?: A Little Help needed standing up from a chair using your arms (e.g., wheelchair or bedside chair)?: A Little Help needed to walk in hospital room?: A Little Help needed climbing 3-5 steps with a railing? : A Little 6 Click Score: 18    End of Session Equipment Utilized During Treatment: Gait belt Activity Tolerance: Patient tolerated treatment well Patient left: in chair;with call bell/phone within reach;with chair alarm set;with family/visitor present   PT Visit Diagnosis: Unsteadiness on feet (R26.81);Muscle weakness (generalized) (M62.81);Difficulty in walking, not elsewhere classified (R26.2)     Time: 1119-1140 PT Time Calculation (min) (ACUTE ONLY): 21 min  Charges:  $Gait Training: 8-22 mins            Amadeus Oyama B. Jarom Govan, PT, DPT  Acute Rehabilitation [phone] pager [phone] office  @ [place]: [phone]            01/03/2019, 11:52 AM

## 2019-01-03 NOTE — Progress Notes (Signed)
Pt discharge education and instructions completed with pt and son at bedside; both voices understanding and denies any questions. Pt telemetry removed; pt discharge home with son to transport to disposition. Pt transported off unit via wheelchair with belongings and son to the side. Delia Heady RN

## 2019-01-03 NOTE — Progress Notes (Signed)
   01/03/19 0027  Provider Notification  Provider Name/Title NP Dewaine Oats  Date Provider Notified 01/03/19  Time Provider Notified 0028  Notification Type Page  Notification Reason Other (Comment) (Pt's BP read 204/79, repeat manually 175/80)  Response See new orders  Date of Provider Response 01/03/19  Time of Provider Response [phone]

## 2019-01-03 NOTE — Procedures (Signed)
Patient Name: Amanda [name]  MRN: [id number]  Epilepsy Attending: Lora Havens  Referring Physician/Provider: Dr Monica Becton Date: 01/03/2019 Duration: 24.08 mins  Patient history: 85 year old female with speech disturbance.  EEG to evaluate for seizure.  Level of alertness: Awake, sleep  Technical aspects: This EEG study was done with scalp electrodes positioned according to the 10-20 International system of electrode placement. Electrical activity was acquired at a sampling rate of 500Hz  and reviewed with a high frequency filter of 70Hz  and a low frequency filter of 1Hz . EEG data were recorded continuously and digitally stored.   Description: The posterior dominant rhythm consists of 8-9 Hz activity of moderate voltage (25-35 uV) seen predominantly in posterior head regions, symmetric and reactive to eye opening and eye closing.  Sleep was characterized by vertex waves, sleep spindles (12 to 14 Hz), maximal frontocentral region.  There was also intermittent 3 to 5 Hz theta-delta slowing maximal bitemporal region  Left>right Hyperventilation and photic stimulation were not performed.  IMPRESSION: This study is suggestive of cortical dysfunction in left more than right temporal region. No seizures or epileptiform discharges were seen throughout the recording.   Vollie Brunty Barbra Sarks

## 2019-01-03 NOTE — Progress Notes (Signed)
EEG complete - results pending 

## 2019-01-03 NOTE — Discharge Instructions (Signed)

## 2019-01-03 NOTE — Discharge Summary (Signed)
Physician Discharge Summary  Amanda [name] LPF:[id number] DOB: 20-Nov-1918 DOA: 12/31/2018  PCP: Mayra Neer, MD  Admit date: 12/31/2018 Discharge date: 01/03/2019  Time spent: 35 minutes  Recommendations for Outpatient Follow-up:  1. F/u with PCP in 1 week 2. DC home with [facility]  Discharge Diagnoses:  Principal Problem:   Aphasia Active Problems:   Atrial fibrillation ([place])   Diabetes mellitus type 2, controlled ([place])   HTN (hypertension)   Dysarthria    Discharge Condition: stable   Diet recommendation: Diabetic  Filed Weights   12/31/18 2059  Weight: 74.8 kg    History of present illness  and Hospital Course:  85 year old female with history of atrial fibrillation on Xarelto, non-insulin-dependent diabetes mellitus, hypothyroidism, previous stroke with slurred speech, right hemiplegia was no normal until 12/30/2018 1845.  Patient at baseline is independent of ADL IADL.  Patient went to neighbor's house, found to be confused with a difficulty speaking with expressive aphasia.  Did not have any other focal weakness.  Work-up in the emergency department no obvious signs of infection are noted.  CT head without contrast did not show any acute abnormality.  Patient underwent MRI, MRA of the brain showed poor quality to motion, however did not show any acute stroke.  Evaluated by neurology, recommends EEG to rule out the possibility of seizures causing her significant confusion, neuro deficits.  ##Expressive aphasia sudden onset -MRI showed negative for acute stroke -However neurology is concerned about possible seizures  -Ordered EEG-neg for seizure activity -Carotid Dopplers-no stenosis. -No arrhythmias are noted on telemetry  - echocardiogram-EF of 60 to 65%, no valvular abnormalities -Evaluated by physical therapy, recommended home health with rehab.  ##Atrial fibrillation permanent -Not on any rate controlling medications -Continue with Xarelto  Vitamin B12  deficiency -Admitted with B12 levels of 140 -B12 thousand micrograms IM daily  ##Diabetes mellitus -Follow-up on sliding scale insulin -Holding glimepiride while in the hospital  ##Hypertension -Controlled    Principal Problem:   Aphasia Active Problems:   Atrial fibrillation ([facility])   Diabetes mellitus type 2, controlled ([place])   HTN (hypertension)   Dysarthria  Procedures: eEG  Consultations:  neurology  Discharge Exam: Vitals:   01/03/19 1158 01/03/19 1515  BP: (!) 188/80 (!) 177/65  Pulse: (!) 55 62  Resp: 20 20  Temp: 98.2 F (36.8 C) 97.6 F (36.4 C)  SpO2: 100% 100%    GGeneral exam: Appears calm and comfortable, sitting in the chair Respiratory system: Clear to auscultation. Respiratory effort normal. Cardiovascular system: S1 & S2 heard, RRR. No JVD, murmurs, rubs, gallops or clicks. No pedal edema. Gastrointestinal system: Abdomen is nondistended, soft and nontender. No organomegaly or masses felt. Normal bowel sounds heard. Central nervous system: Alert and oriented. No focal neurological deficits.  Proved to dysarthria  extremities: Symmetric 5 x 5 power. Skin: No rashes, lesions or ulcers Psychiatry: Judgement and insight appear normal. Mood & affect appropriate.    Discharge Instructions    Allergies as of 01/03/2019      Reactions   Other Other (See Comments)   Calcium w/D causes constipation   Tape Other (See Comments)   SKIN IS VERY THIN AND WILL TEAR AND BRUISE EASILY!! PLEASE USE COBAN WRAP   Codeine Nausea Only   Hydrocodone Nausea Only   Metoprolol Nausea Only   Rythmol [propafenone Hcl] Other (See Comments)   Dizziness    Shellfish Allergy Itching, Rash   MUSSELS (only)   Sulfa Antibiotics Nausea Only  Medication List    TAKE these medications   acetaminophen 500 MG tablet Commonly known as: TYLENOL Take 1,000 mg by mouth every 6 (six) hours as needed (for pain).   Arnica 20 % Tinc Apply 1 application topically  daily as needed (for pain).   atorvastatin 20 MG tablet Commonly known as: LIPITOR Take 1 tablet (20 mg total) by mouth daily at 6 PM.   Fish Oil 1000 MG Caps Take 1 capsule by mouth every morning.   glimepiride 1 MG tablet Commonly known as: AMARYL Take 1.5 mg by mouth daily with breakfast.   levothyroxine 88 MCG tablet Commonly known as: SYNTHROID Take 88 mcg by mouth daily.   lisinopril 10 MG tablet Commonly known as: ZESTRIL Take 10 mg by mouth daily.   meclizine 25 MG tablet Commonly known as: ANTIVERT Take one every 6-8 hours as needed for dizziness   ondansetron 4 MG disintegrating tablet Commonly known as: ZOFRAN-ODT Take 4 mg by mouth every 4 (four) hours as needed for nausea or vomiting.   PRESERVISION AREDS 2 PO Take 1 capsule by mouth 2 (two) times daily.   PROBIOTIC DAILY PO Take 2 tablets by mouth daily as needed (DIGESTION).   Xarelto 15 MG Tabs tablet Generic drug: Rivaroxaban TAKE 1 TABLET(15 MG) BY MOUTH DAILY WITH SUPPER What changed: See the new instructions.      Allergies  Allergen Reactions  . Other Other (See Comments)    Calcium w/D causes constipation  . Tape Other (See Comments)    SKIN IS VERY THIN AND WILL TEAR AND BRUISE EASILY!! PLEASE USE COBAN WRAP  . Codeine Nausea Only  . Hydrocodone Nausea Only  . Metoprolol Nausea Only  . Rythmol [Propafenone Hcl] Other (See Comments)    Dizziness   . Shellfish Allergy Itching and Rash    MUSSELS (only)  . Sulfa Antibiotics Nausea Only      The results of significant diagnostics from this hospitalization (including imaging, microbiology, ancillary and laboratory) are listed below for reference.    Significant Diagnostic Studies: Ct Head Wo Contrast  Result Date: 12/31/2018 CLINICAL DATA:  Focal neuro deficit EXAM: CT HEAD WITHOUT CONTRAST TECHNIQUE: Contiguous axial images were obtained from the base of the skull through the vertex without intravenous contrast. COMPARISON:  May 24, 2014 CT, MRI Sep 28, 2015 FINDINGS: Brain: No evidence of acute territorial infarction, hemorrhage, hydrocephalus,extra-axial collection or mass lesion/mass effect. There is dilatation the ventricles and sulci consistent with age-related atrophy. Low-attenuation changes in the deep white matter consistent with small vessel ischemia. There is a lacunar infarct involving the right corona radiata adjacent to the caudate head. Vascular: No hyperdense vessel or unexpected calcification. Skull: The skull is intact. No fracture or focal lesion identified. Sinuses/Orbits: The visualized paranasal sinuses and mastoid air cells are clear. The orbits and globes intact. Other: None IMPRESSION: No acute intracranial abnormality. Findings consistent with age related atrophy and chronic small vessel ischemia Right lacunar infarct involving the corona radiata Electronically Signed   By: Prudencio Pair M.D.   On: 12/31/2018 23:05   Mr Angio Head Wo Contrast  Result Date: 01/01/2019 CLINICAL DATA:  Slurred speech and expressive aphasia. EXAM: MRI HEAD WITHOUT CONTRAST MRA HEAD WITHOUT CONTRAST TECHNIQUE: Multiplanar, multiecho pulse sequences of the brain and surrounding structures were obtained without intravenous contrast. Angiographic images of the head were obtained using MRA technique without contrast. COMPARISON:  Brain MRI 09/28/2015 FINDINGS: MRI HEAD FINDINGS The examination is markedly degraded by motion. Diffusion-weighted  imaging shows no evidence of acute ischemia. There is generalized atrophy. No midline shift or other mass effect. No FLAIR sequence was obtained. No axial T1-weighted image was obtained. Midline structures are unremarkable. MRA HEAD FINDINGS Severely motion degraded. Within that limitation, there is no proximal large vessel occlusion. Both middle cerebral arteries are patent to the level of the M1 bifurcation. The basilar artery is of normal caliber. The visualized intracranial internal carotid  arteries are normal. IMPRESSION: Truncated and severely motion degraded examination without acute ischemic infarct or proximal large vessel occlusion. Electronically Signed   By: Ulyses Jarred M.D.   On: 01/01/2019 03:16   Mr Brain Wo Contrast  Result Date: 01/01/2019 CLINICAL DATA:  Slurred speech and expressive aphasia. EXAM: MRI HEAD WITHOUT CONTRAST MRA HEAD WITHOUT CONTRAST TECHNIQUE: Multiplanar, multiecho pulse sequences of the brain and surrounding structures were obtained without intravenous contrast. Angiographic images of the head were obtained using MRA technique without contrast. COMPARISON:  Brain MRI 09/28/2015 FINDINGS: MRI HEAD FINDINGS The examination is markedly degraded by motion. Diffusion-weighted imaging shows no evidence of acute ischemia. There is generalized atrophy. No midline shift or other mass effect. No FLAIR sequence was obtained. No axial T1-weighted image was obtained. Midline structures are unremarkable. MRA HEAD FINDINGS Severely motion degraded. Within that limitation, there is no proximal large vessel occlusion. Both middle cerebral arteries are patent to the level of the M1 bifurcation. The basilar artery is of normal caliber. The visualized intracranial internal carotid arteries are normal. IMPRESSION: Truncated and severely motion degraded examination without acute ischemic infarct or proximal large vessel occlusion. Electronically Signed   By: Ulyses Jarred M.D.   On: 01/01/2019 03:16   Vas US Carotid  Result Date: 01/01/2019 Carotid Arterial Duplex Study Indications:       Speech disturbance. Risk Factors:      Diabetes, prior CVA. Other Factors:     Atrial fibrillation, on Xarelto. Limitations:       Bradycardia Comparison Study:  No prior study on file for comparison Performing Technologist: Sharion Dove RVS  Examination Guidelines: A complete evaluation includes B-mode imaging, spectral Doppler, color Doppler, and power Doppler as needed of all accessible  portions of each vessel. Bilateral testing is considered an integral part of a complete examination. Limited examinations for reoccurring indications may be performed as noted.  Right Carotid Findings: +----------+--------+--------+--------+--------+------------------+           PSV cm/sEDV cm/sStenosisDescribeComments           +----------+--------+--------+--------+--------+------------------+ CCA Prox  73      7                       intimal thickening +----------+--------+--------+--------+--------+------------------+ CCA Distal67      11                      intimal thickening +----------+--------+--------+--------+--------+------------------+ ICA Prox  69      13              calcific                   +----------+--------+--------+--------+--------+------------------+ ICA Distal73      12                                         +----------+--------+--------+--------+--------+------------------+ ECA       117     6                                          +----------+--------+--------+--------+--------+------------------+ +----------+--------+-------+--------+-------------------+  PSV cm/sEDV cmsDescribeArm Pressure (mmHG) +----------+--------+-------+--------+-------------------+ Subclavian80                                         +----------+--------+-------+--------+-------------------+ +---------+--------+--+--------+--+ VertebralPSV cm/s42EDV cm/s12 +---------+--------+--+--------+--+  Left Carotid Findings: +----------+--------+--------+--------+------------+------------------+           PSV cm/sEDV cm/sStenosisDescribe    Comments           +----------+--------+--------+--------+------------+------------------+ CCA Prox  114     16                          intimal thickening +----------+--------+--------+--------+------------+------------------+ CCA Distal109     11                          intimal thickening  +----------+--------+--------+--------+------------+------------------+ ICA Prox  60      12              heterogenous                   +----------+--------+--------+--------+------------+------------------+ ICA Distal45      11                                             +----------+--------+--------+--------+------------+------------------+ ECA       92      6                                              +----------+--------+--------+--------+------------+------------------+ +----------+--------+--------+--------+-------------------+ SubclavianPSV cm/sEDV cm/sDescribeArm Pressure (mmHG) +----------+--------+--------+--------+-------------------+           119                                         +----------+--------+--------+--------+-------------------+ +---------+--------+--+--------+-+ VertebralPSV cm/s35EDV cm/s8 +---------+--------+--+--------+-+  Summary: Right Carotid: The extracranial vessels were near-normal with only minimal wall                thickening or plaque. Left Carotid: The extracranial vessels were near-normal with only minimal wall               thickening or plaque. Vertebrals:  Bilateral vertebral arteries demonstrate antegrade flow. Subclavians: Normal flow hemodynamics were seen in bilateral subclavian              arteries. *See table(s) above for measurements and observations.  Electronically signed by Antony Contras MD on 01/01/2019 at 12:46:36 PM.    Final     Microbiology: Recent Results (from the past 240 hour(s))  SARS CORONAVIRUS 2 Nasal Swab Aptima Multi Swab     Status: None   Collection Time: 01/01/19  4:00 AM   Specimen: Aptima Multi Swab; Nasal Swab  Result Value Ref Range Status   SARS Coronavirus 2 NEGATIVE NEGATIVE Final    Comment: (NOTE) SARS-CoV-2 target nucleic acids are NOT DETECTED. The SARS-CoV-2 RNA is generally detectable in upper and lower respiratory specimens during the acute phase of infection.  Negative results do not preclude SARS-CoV-2 infection, do not rule out co-infections with other pathogens, and should not be used  as the sole basis for treatment or other patient management decisions. Negative results must be combined with clinical observations, patient history, and epidemiological information. The expected result is Negative. Fact Sheet for Patients: SugarRoll.be Fact Sheet for Healthcare Providers: https://www.woods-mathews.com/ This test is not yet approved or cleared by the [place] FDA and  has been authorized for detection and/or diagnosis of SARS-CoV-2 by FDA under an Emergency Use Authorization (EUA). This EUA will remain  in effect (meaning this test can be used) for the duration of the COVID-19 declaration under Section 56 4(b)(1) of the Act, 21 U.S.C. section 360bbb-3(b)(1), unless the authorization is terminated or revoked sooner. Performed at [facility], [name] [address] [place], [place] [address]      Labs: Basic Metabolic Panel: Recent Labs  Lab 12/31/18 2103  NA 137  K 4.6  CL 103  CO2 24  GLUCOSE 165*  BUN 14  CREATININE 0.76  CALCIUM 9.2   Liver Function Tests: Recent Labs  Lab 12/31/18 2103  AST 22  ALT 15  ALKPHOS 96  BILITOT 0.6  PROT 6.7  ALBUMIN 3.6   No results for input(s): LIPASE, AMYLASE in the last 168 hours. No results for input(s): AMMONIA in the last 168 hours. CBC: Recent Labs  Lab 12/31/18 2103  WBC 7.1  NEUTROABS 4.7  HGB 12.7  HCT 39.6  MCV 101.3*  PLT 182   Cardiac Enzymes: No results for input(s): CKTOTAL, CKMB, CKMBINDEX, TROPONINI in the last 168 hours. BNP: BNP (last 3 results) No results for input(s): BNP in the last 8760 hours.  ProBNP (last 3 results) No results for input(s): PROBNP in the last 8760 hours.  CBG: Recent Labs  Lab 01/02/19 2118 01/03/19 0607 01/03/19 1151 01/03/19 1512 01/03/19 1739  GLUCAP 180* 140* 209* 250*  177*       Signed:  Kharon Hixon MD.  [facility] 01/03/2019, 11:53 PM

## 2019-01-05 ENCOUNTER — Other Ambulatory Visit: Payer: Self-pay

## 2019-01-05 ENCOUNTER — Encounter: Payer: Self-pay | Admitting: Cardiovascular Disease

## 2019-01-05 ENCOUNTER — Ambulatory Visit (INDEPENDENT_AMBULATORY_CARE_PROVIDER_SITE_OTHER): Payer: Medicare Other | Admitting: Cardiovascular Disease

## 2019-01-05 VITALS — BP 118/62 | HR 91 | Ht 63.0 in | Wt 158.8 lb

## 2019-01-05 DIAGNOSIS — I482 Chronic atrial fibrillation, unspecified: Secondary | ICD-10-CM

## 2019-01-05 DIAGNOSIS — I10 Essential (primary) hypertension: Secondary | ICD-10-CM

## 2019-01-05 DIAGNOSIS — I4821 Permanent atrial fibrillation: Secondary | ICD-10-CM | POA: Diagnosis not present

## 2019-01-05 NOTE — Patient Instructions (Signed)
Medication Instructions:  Your physician recommends that you continue on your current medications as directed. Please refer to the Current Medication list given to you today.  If you need a refill on your cardiac medications before your next appointment, please call your pharmacy.   Lab work: None ordered  If you have labs (blood work) drawn today and your tests are completely normal, you will receive your results only by: . MyChart Message (if you have MyChart) OR . A paper copy in the mail If you have any lab test that is abnormal or we need to change your treatment, we will call you to review the results.  Testing/Procedures: None ordered   Follow-Up: At [facility], you and your health needs are our priority.  As part of our continuing mission to provide you with exceptional heart care, we have created designated Provider Care Teams.  These Care Teams include your primary Cardiologist (physician) and Advanced Practice Providers (APPs -  Physician Assistants and Nurse Practitioners) who all work together to provide you with the care you need, when you need it. You will need a follow up appointment in:  12 months.  Please call our office 2 months in advance to schedule this appointment.  You may see Philip Nahser, MD or one of the following Advanced Practice Providers on your designated Care Team: Scott Weaver, PA-C Vin Bhagat, PA-C . Janine Hammond, NP  Any Other Special Instructions Will Be Listed Below (If Applicable).    

## 2019-01-05 NOTE — Progress Notes (Signed)
Cardiology Office Note   Date:  01/05/2019   ID:  Amanda [name], DOB June 24, 1918, MRN [id number]  PCP:  Mayra Neer, MD  Cardiologist: Darlin Coco MD - now [place]   Chief Complaint  Patient presents with  . Atrial Fibrillation   Problem List 1. Chronic atrial fib 2. CVA 3. Diabetes mellitus 4. Essential HTN 5. Hypothyroidism     Amanda [name] is a 85 y.o. female who presents for a scheduled follow-up visit.  This pleasant 85 year old lady is seen for a scheduled followup office visit. She has a history of established atrial fibrillation and is on long-term anticoagulation.. She does not have any history of ischemic heart disease. She had a normal nuclear stress test in 2006. She had an echocardiogram in 2009 which showed mild left atrial enlargement. The patient has a history of essential hypertension and diabetes and hypothyroidism. Since last visit she has been feeling well. She continues to live by herself in her own home. She still drives herself to her appointments and to the grocery store.  The patient had a recent hospital admission for 2 strokes which left her with initial difficulty with speaking.Her speech has subsequently returned to normal. The patient is now on Xarelto rather than warfarin. She denies any new cardiac symptoms. No chest pain. No shortness of breath. No dizziness or syncope. No awareness of rapid palpitations. She still drives and lives by herself.  She has occasional episodes of dizziness.  No syncope.  She has not been having any chest pain or increased shortness of breath.  She has not had any recurrent episodes of stroke or TIA  Oct 10, 2015:  Amanda [name] is seen for the 1st time - transfer from [place].   Seen with daughter in law - Amanda [name] .  Was hospitalized several weeks ago with vertigo  Has chronic A-fib - is on Xarelto 15 mg a day - we reduced the dose recently   Still lives alone,    Gets help with  grocery shopping .   Nov. 20, 2017:  Amanda [name] is seen back to day for her atrial fib. BP is a little elevated.   Has been under some stress.  Is not driving anymore.  Feb. 2, 2018:  Seen with daughter in law, Amanda [name] She fell and injured her knee.   Her medical doctor has stopped her Xarelto until she was seen here today   March 17, 2018: Amanda [name] is seen for follow-up visit with a friend from [place].     She has atrial fibrillation. On Xarelto 15 mg a day - her creatinine cl. Is 105.  Doing well from a cardiac standpoint   Aug. 19, 2020   Amanda [name] is seen today .  She was recently hospitalized with aphasia.  She has chronic AF.   Is on xarelto 15 mg a day   MRI did not reveal any acute stroke ,  There was concern about seizures.  She thinks her episode was due to low glucose . - but glucose levels in the hospital were high / normal       Past Medical History:  Diagnosis Date  . Carpal tunnel syndrome   . Chronic anticoagulation   . Chronic atrial fibrillation   . Diabetes mellitus    NON-INSULIN  . Hypothyroidism   . Sinus congestion     Past Surgical History:  Procedure Laterality Date  . APPENDECTOMY    . CARDIOVASCULAR STRESS TEST  07/10/2004  EF 81%  . CHOLECYSTECTOMY    . US ECHOCARDIOGRAPHY  12/06/2007   EF 55-60%     Current Outpatient Medications  Medication Sig Dispense Refill  . acetaminophen (TYLENOL) 500 MG tablet Take 1,000 mg by mouth every 6 (six) hours as needed (for pain).    . Arnica 20 % TINC Apply 1 application topically daily as needed (for pain).     Marland Kitchen glimepiride (AMARYL) 1 MG tablet Take 1.5 mg by mouth daily with breakfast.     . levothyroxine (SYNTHROID, LEVOTHROID) 88 MCG tablet Take 88 mcg by mouth daily.      Marland Kitchen lisinopril (PRINIVIL,ZESTRIL) 10 MG tablet Take 10 mg by mouth daily.      . meclizine (ANTIVERT) 25 MG tablet Take one every 6-8 hours as needed for dizziness 20 tablet 0  . Multiple Vitamins-Minerals (PRESERVISION  AREDS 2 PO) Take 1 capsule by mouth 2 (two) times daily.     . Omega-3 Fatty Acids (FISH OIL) 1000 MG CAPS Take 1 capsule by mouth every morning.    . ondansetron (ZOFRAN-ODT) 4 MG disintegrating tablet Take 4 mg by mouth every 4 (four) hours as needed for nausea or vomiting.    . Probiotic Product (PROBIOTIC DAILY PO) Take 2 tablets by mouth daily as needed (DIGESTION).     Marland Kitchen XARELTO 15 MG TABS tablet TAKE 1 TABLET(15 MG) BY MOUTH DAILY WITH SUPPER 30 tablet 5   No current facility-administered medications for this visit.     Allergies:   Other, Tape, Codeine, Hydrocodone, Metoprolol, Rythmol [propafenone hcl], Shellfish allergy, and Sulfa antibiotics    Social History:  The patient  reports that she has never smoked. She has never used smokeless tobacco. She reports that she does not drink alcohol or use drugs.   Family History:  The patient's family history includes Diabetes in her sister; Hypertension in her mother and another family member; Stroke in her mother.    ROS:  Please see the history of present illness.   Otherwise, review of systems are positive for none.   All other systems are reviewed and negative.   Physical Exam: Blood pressure 118/62, pulse 91, height 5\' 3"  (1.6 m), weight 158 lb 12.8 oz (72 kg), SpO2 99 %.  GEN:  Elderly female,  Frail  HEENT: Normal NECK: No JVD; No carotid bruits LYMPHATICS: No lymphadenopathy CARDIAC: Irreg. Irreg. , no murmurs, rubs, gallops RESPIRATORY:  Clear to auscultation without rales, wheezing or rhonchi  ABDOMEN: Soft, non-tender, non-distended MUSCULOSKELETAL:  No edema; No deformity  SKIN: Warm and dry NEUROLOGIC:  Alert and oriented x 3   EKG:     Recent Labs: 12/31/2018: ALT 15; BUN 14; Creatinine, Ser 0.76; Hemoglobin 12.7; Platelets 182; Potassium 4.6; Sodium 137 01/01/2019: TSH 0.716    Lipid Panel    Component Value Date/Time   CHOL 187 01/01/2019 0615   TRIG 55 01/01/2019 0615   HDL 57 01/01/2019 0615   CHOLHDL  3.3 01/01/2019 0615   VLDL 11 01/01/2019 0615   LDLCALC 119 (H) 01/01/2019 0615   LDLDIRECT 137.0 01/06/2012 1211      Wt Readings from Last 3 Encounters:  01/05/19 158 lb 12.8 oz (72 kg)  12/31/18 164 lb 14.5 oz (74.8 kg)  03/17/18 165 lb (74.8 kg)        ASSESSMENT AND PLAN:  1. Permanent atrial fibrillation- In on Xarelto 15 mg - Cr. Cl is 42 so this is the correct dose.   no evidence of bleeding  2. essential hypertension without congestive heart failure BP is well controlled.     5. Hx  Of stroke felt to be embolic occurring at a time that her INR was 1.9.  Recent episode of aphasia.   No evidence of new CVA. .  This episode was not related to a cardiac etiology  Will return to see her primary md. Will see her in 1 year .   Current medicines are reviewed at length with the patient today.  The patient does not have concerns regarding medicines.  The following changes have been made:  no change  Labs/ tests ordered today include:   No orders of the defined types were placed in this encounter.    Disposition: No change in meds     Mertie Moores, MD  01/05/2019 1:44 PM    [address],  [name], [place]  [address] Pager [phone] Phone: [phone]; Fax: [phone]

## 2019-01-13 DIAGNOSIS — Z7189 Other specified counseling: Secondary | ICD-10-CM | POA: Diagnosis not present

## 2019-01-13 DIAGNOSIS — R4701 Aphasia: Secondary | ICD-10-CM | POA: Diagnosis not present

## 2019-01-13 DIAGNOSIS — E538 Deficiency of other specified B group vitamins: Secondary | ICD-10-CM | POA: Diagnosis not present

## 2019-02-22 DIAGNOSIS — H903 Sensorineural hearing loss, bilateral: Secondary | ICD-10-CM | POA: Diagnosis not present

## 2019-02-22 DIAGNOSIS — H6123 Impacted cerumen, bilateral: Secondary | ICD-10-CM | POA: Diagnosis not present

## 2019-02-22 DIAGNOSIS — Z974 Presence of external hearing-aid: Secondary | ICD-10-CM | POA: Diagnosis not present

## 2019-02-22 DIAGNOSIS — J302 Other seasonal allergic rhinitis: Secondary | ICD-10-CM | POA: Diagnosis not present

## 2019-03-23 DIAGNOSIS — Z794 Long term (current) use of insulin: Secondary | ICD-10-CM | POA: Diagnosis not present

## 2019-03-23 DIAGNOSIS — I4819 Other persistent atrial fibrillation: Secondary | ICD-10-CM | POA: Diagnosis not present

## 2019-03-23 DIAGNOSIS — E039 Hypothyroidism, unspecified: Secondary | ICD-10-CM | POA: Diagnosis not present

## 2019-03-23 DIAGNOSIS — K5790 Diverticulosis of intestine, part unspecified, without perforation or abscess without bleeding: Secondary | ICD-10-CM | POA: Diagnosis not present

## 2019-03-23 DIAGNOSIS — Z0000 Encounter for general adult medical examination without abnormal findings: Secondary | ICD-10-CM | POA: Diagnosis not present

## 2019-03-23 DIAGNOSIS — K589 Irritable bowel syndrome without diarrhea: Secondary | ICD-10-CM | POA: Diagnosis not present

## 2019-03-23 DIAGNOSIS — F411 Generalized anxiety disorder: Secondary | ICD-10-CM | POA: Diagnosis not present

## 2019-03-23 DIAGNOSIS — I10 Essential (primary) hypertension: Secondary | ICD-10-CM | POA: Diagnosis not present

## 2019-03-23 DIAGNOSIS — J301 Allergic rhinitis due to pollen: Secondary | ICD-10-CM | POA: Diagnosis not present

## 2019-03-23 DIAGNOSIS — E11319 Type 2 diabetes mellitus with unspecified diabetic retinopathy without macular edema: Secondary | ICD-10-CM | POA: Diagnosis not present

## 2019-03-23 DIAGNOSIS — K219 Gastro-esophageal reflux disease without esophagitis: Secondary | ICD-10-CM | POA: Diagnosis not present

## 2019-03-23 DIAGNOSIS — M8580 Other specified disorders of bone density and structure, unspecified site: Secondary | ICD-10-CM | POA: Diagnosis not present

## 2019-03-23 DIAGNOSIS — E782 Mixed hyperlipidemia: Secondary | ICD-10-CM | POA: Diagnosis not present

## 2019-05-03 ENCOUNTER — Telehealth: Payer: Self-pay | Admitting: Cardiovascular Disease

## 2019-05-03 NOTE — Telephone Encounter (Signed)
I spoke to the patient's daughter Izora Gala) who called because the patient had a nose bleed last night, starting in the middle of the night, and lasting intermittently until 3:15 pm today 12/15, when the son and daughter recommended she "pinch her nose".  It has not restarted, and they are holding her evening dose of Xarelto 15 mg until further advisement.

## 2019-05-03 NOTE — Telephone Encounter (Signed)
New Message  Pt c/o medication issue:  1. Name of Medication: XARELTO 15 MG TABS tablet  2. How are you currently taking this medication (dosage and times per day)?  TAKE 1 TABLET(15 MG) BY MOUTH DAILY WITH SUPPER    3. Are you having a reaction (difficulty breathing--STAT)? Nose bleed  4. What is your medication issue? Patient has been bleeding over the night through 3 pm. Patient would like to know should se take her blood thinner tonight. Please call and advise.

## 2019-05-03 NOTE — Telephone Encounter (Signed)
Holding tonights dose of Xarelto is fine. If the bleeding has stopped and does not recur, she may restart the Xarelto tomorrow night. If the bleeding recurs, she should be referred to ENT to see if there is something that can be cauterized to prevent further nose bleeds.    If she has more bleeding tomorrow, she may hold xarelto for 1 additional day.

## 2019-05-04 NOTE — Telephone Encounter (Signed)
Spoke with patient who states she held Xarelto last night and has not had any additional bleeding. I advised her that if bleeding reoccurs today, that she may hold her Xarelto tonight (1 more dose) and should contact ENT. She states her ENT is Dr. Gardiner Rhyme and she will contact him if bleeding reoccurs. She thanked me for the call.

## 2019-07-22 ENCOUNTER — Other Ambulatory Visit: Payer: Self-pay | Admitting: Cardiovascular Disease

## 2019-07-22 NOTE — Telephone Encounter (Signed)
New message     *STAT* If patient is at the pharmacy, call can be transferred to refill team.   1. Which medications need to be refilled? (please list name of each medication and dose if known) XARELTO 15 MG TABS tablet  2. Which pharmacy/location (including street and city if local pharmacy) is medication to be sent to?[facility] [place], [place] AT [address]  3. Do they need a 30 day or 90 day supply? [place]

## 2019-07-25 MED ORDER — RIVAROXABAN 15 MG PO TABS: ORAL_TABLET | ORAL | 5 refills | Status: DC

## 2019-07-25 NOTE — Telephone Encounter (Signed)
Xarelto 15mg  refill request received. Pt is 86 years old, weight-72 kg, Crea-0.76 on 12/31/2018, last seen by Dr. Acie Fredrickson on 01/05/2019, Diagnosis-Afb, CrCl-46.4ml/min; Dose is appropriate based on dosing criteria. Will send in refill to requested pharmacy.

## 2019-07-27 DIAGNOSIS — Z23 Encounter for immunization: Secondary | ICD-10-CM | POA: Diagnosis not present

## 2019-08-02 DIAGNOSIS — H90A31 Mixed conductive and sensorineural hearing loss, unilateral, right ear with restricted hearing on the contralateral side: Secondary | ICD-10-CM | POA: Diagnosis not present

## 2019-08-02 DIAGNOSIS — H90A22 Sensorineural hearing loss, unilateral, left ear, with restricted hearing on the contralateral side: Secondary | ICD-10-CM | POA: Diagnosis not present

## 2019-08-22 DIAGNOSIS — Z23 Encounter for immunization: Secondary | ICD-10-CM | POA: Diagnosis not present

## 2019-09-21 DIAGNOSIS — E11319 Type 2 diabetes mellitus with unspecified diabetic retinopathy without macular edema: Secondary | ICD-10-CM | POA: Diagnosis not present

## 2019-09-21 DIAGNOSIS — J301 Allergic rhinitis due to pollen: Secondary | ICD-10-CM | POA: Diagnosis not present

## 2019-09-21 DIAGNOSIS — E039 Hypothyroidism, unspecified: Secondary | ICD-10-CM | POA: Diagnosis not present

## 2019-09-21 DIAGNOSIS — I10 Essential (primary) hypertension: Secondary | ICD-10-CM | POA: Diagnosis not present

## 2019-09-21 DIAGNOSIS — I4819 Other persistent atrial fibrillation: Secondary | ICD-10-CM | POA: Diagnosis not present

## 2019-11-30 ENCOUNTER — Encounter (INDEPENDENT_AMBULATORY_CARE_PROVIDER_SITE_OTHER): Payer: Medicare Other | Admitting: Ophthalmology

## 2019-11-30 ENCOUNTER — Other Ambulatory Visit: Payer: Self-pay

## 2019-11-30 DIAGNOSIS — H35342 Macular cyst, hole, or pseudohole, left eye: Secondary | ICD-10-CM | POA: Diagnosis not present

## 2019-11-30 DIAGNOSIS — I10 Essential (primary) hypertension: Secondary | ICD-10-CM

## 2019-11-30 DIAGNOSIS — H4423 Degenerative myopia, bilateral: Secondary | ICD-10-CM | POA: Diagnosis not present

## 2019-11-30 DIAGNOSIS — H35033 Hypertensive retinopathy, bilateral: Secondary | ICD-10-CM

## 2019-11-30 DIAGNOSIS — H43813 Vitreous degeneration, bilateral: Secondary | ICD-10-CM | POA: Diagnosis not present

## 2020-01-09 ENCOUNTER — Encounter: Payer: Self-pay | Admitting: Cardiovascular Disease

## 2020-01-09 NOTE — Progress Notes (Signed)
Cardiology Office Note   Date:  01/10/2020   ID:  Hildred Laser, DOB 13-Nov-1918, MRN [id number]  PCP:  Mayra Neer, MD  Cardiologist: Darlin Coco MD - now [place]   Chief Complaint  Patient presents with  . Atrial Fibrillation   Problem List 1. Chronic atrial fib 2. CVA 3. Diabetes mellitus 4. Essential HTN 5. Hypothyroidism     Amanda [name] is a 86 y.o. female who presents for a scheduled follow-up visit.  This pleasant 86 year old lady is seen for a scheduled followup office visit. She has a history of established atrial fibrillation and is on long-term anticoagulation.. She does not have any history of ischemic heart disease. She had a normal nuclear stress test in 2006. She had an echocardiogram in 2009 which showed mild left atrial enlargement. The patient has a history of essential hypertension and diabetes and hypothyroidism. Since last visit she has been feeling well. She continues to live by herself in her own home. She still drives herself to her appointments and to the grocery store.  The patient had a recent hospital admission for 2 strokes which left her with initial difficulty with speaking.Her speech has subsequently returned to normal. The patient is now on Xarelto rather than warfarin. She denies any new cardiac symptoms. No chest pain. No shortness of breath. No dizziness or syncope. No awareness of rapid palpitations. She still drives and lives by herself.  She has occasional episodes of dizziness.  No syncope.  She has not been having any chest pain or increased shortness of breath.  She has not had any recurrent episodes of stroke or TIA  Oct 10, 2015:  Amanda [name] is seen for the 1st time - transfer from [place].   Seen with daughter in law - [name] .  Was hospitalized several weeks ago with vertigo  Has chronic A-fib - is on Xarelto 15 mg a day - we reduced the dose recently   Still lives alone,    Gets help with  grocery shopping .   Nov. 20, 2017:  Amanda [name] is seen back to day for her atrial fib. BP is a little elevated.   Has been under some stress.  Is not driving anymore.  Feb. 2, 2018:  Seen with daughter in law, [name] She fell and injured her knee.   Her medical doctor has stopped her Xarelto until she was seen here today   March 17, 2018: Amanda [name] is seen for follow-up visit with a friend from [place].     She has atrial fibrillation. On Xarelto 15 mg a day - her creatinine cl. Is 37.  Doing well from a cardiac standpoint   Aug. 19, 2020   Amanda [name] is seen today .  She was recently hospitalized with aphasia.  She has chronic AF.   Is on xarelto 15 mg a day   MRI did not reveal any acute stroke ,  There was concern about seizures.  She thinks her episode was due to low glucose . - but glucose levels in the hospital were high / normal     Aug. 24, 2021:  Amanda [name] is seen today .   She has chronic AFib.   She is 86 years old.  Doing well Will be 86 years old next year Her kids are already planning her 89th birthday  Has chronic Afib No syncope or presyncope  No cp or dyspnea  On Xarelto , no blood in stool  Past Medical History:  Diagnosis Date  . Carpal tunnel syndrome   . Chronic anticoagulation   . Chronic atrial fibrillation ([place])   . Diabetes mellitus    NON-INSULIN  . Hypothyroidism   . Sinus congestion     Past Surgical History:  Procedure Laterality Date  . APPENDECTOMY    . CARDIOVASCULAR STRESS TEST  07/10/2004   EF 81%  . CHOLECYSTECTOMY    . US ECHOCARDIOGRAPHY  12/06/2007   EF 55-60%     Current Outpatient Medications  Medication Sig Dispense Refill  . acetaminophen (TYLENOL) 500 MG tablet Take 1,000 mg by mouth every 6 (six) hours as needed (for pain).    . Arnica 20 % TINC Apply 1 application topically daily as needed (for pain).     . Blood Glucose Monitoring Suppl (ONE TOUCH ULTRA 2) w/Device KIT 1 Device by Other route daily.      Marland Kitchen glimepiride (AMARYL) 1 MG tablet Take 1.5 mg by mouth daily with breakfast.     . Lancets (ONETOUCH ULTRASOFT) lancets 1 each by Other route daily.    Marland Kitchen levothyroxine (SYNTHROID, LEVOTHROID) 88 MCG tablet Take 88 mcg by mouth daily.      Marland Kitchen lisinopril (PRINIVIL,ZESTRIL) 10 MG tablet Take 10 mg by mouth daily.      . Magnesium 300 MG CAPS Take 1 capsule by mouth daily.    . meclizine (ANTIVERT) 25 MG tablet Take one every 6-8 hours as needed for dizziness 20 tablet 0  . Multiple Vitamins-Minerals (PRESERVISION AREDS 2 PO) Take 1 capsule by mouth 2 (two) times daily.     . Omega-3 Fatty Acids (FISH OIL) 1000 MG CAPS Take 1 capsule by mouth every morning.    . ondansetron (ZOFRAN-ODT) 4 MG disintegrating tablet Take 4 mg by mouth every 4 (four) hours as needed for nausea or vomiting.    Glory Rosebush ULTRA test strip 1 each by Other route daily.    . Probiotic Product (PROBIOTIC DAILY PO) Take 2 tablets by mouth daily as needed (DIGESTION).     . Rivaroxaban (XARELTO) 15 MG TABS tablet TAKE 1 TABLET(15 MG) BY MOUTH DAILY WITH SUPPER 30 tablet 5  . sodium chloride (OCEAN) 0.65 % nasal spray Place 1 spray into the nose daily as needed.    . Wheat Dextrin (BENEFIBER DRINK MIX PO) Take by mouth daily.     No current facility-administered medications for this visit.    Allergies:   Other, Tape, Codeine, Hydrocodone, Metoprolol, Rythmol [propafenone hcl], Shellfish allergy, and Sulfa antibiotics    Social History:  The patient  reports that she has never smoked. She has never used smokeless tobacco. She reports that she does not drink alcohol and does not use drugs.   Family History:  The patient's family history includes Diabetes in her sister; Hypertension in her mother and another family member; Stroke in her mother.    ROS:  Please see the history of present illness.   Otherwise, review of systems are positive for none.   All other systems are reviewed and negative.   Physical Exam: Blood  pressure 128/74, pulse (!) 59, height '5\' 3"'  (1.6 m), weight 158 lb (71.7 kg), SpO2 97 %.  GEN:  Elderly female .  nad  HEENT: Normal NECK: No JVD; No carotid bruits LYMPHATICS: No lymphadenopathy CARDIAC: irreg. Irreg.   RESPIRATORY:  Clear to auscultation without rales, wheezing or rhonchi  ABDOMEN: Soft, non-tender, non-distended MUSCULOSKELETAL:  No edema; No deformity  SKIN: Warm and  dry NEUROLOGIC:  Alert and oriented x 3    EKG: January 10, 2020: Atrial fibrillation with a heart rate of 59.  Occasional premature ventricular contraction.  Nonspecific ST and T wave changes.   Recent Labs: No results found for requested labs within last 8760 hours.    Lipid Panel    Component Value Date/Time   CHOL 187 01/01/2019 0615   TRIG 55 01/01/2019 0615   HDL 57 01/01/2019 0615   CHOLHDL 3.3 01/01/2019 0615   VLDL 11 01/01/2019 0615   LDLCALC 119 (H) 01/01/2019 0615   LDLDIRECT 137.0 01/06/2012 1211      Wt Readings from Last 3 Encounters:  01/10/20 158 lb (71.7 kg)  01/05/19 158 lb 12.8 oz (72 kg)  12/31/18 164 lb 14.5 oz (74.8 kg)        ASSESSMENT AND PLAN:  1. Permanent atrial fibrillation-  Cont xarelto.  HR is a bit slow but she is not on any HR slowing agents.      2. essential hypertension without congestive heart failure . BP is well controlled.     5. Hx  Of stroke felt to be embolic - was changed from coumadin to xarelto    Current medicines are reviewed at length with the patient today.  The patient does not have concerns regarding medicines.  The following changes have been made:  no change  Labs/ tests ordered today include:   Orders Placed This Encounter  Procedures  . EKG 12-Lead     Disposition: No change in meds. Will see her in 1 year.       Mertie Moores, MD  01/10/2020 11:51 AM    [address],  [place], [place]  [address] Pager [phone] Phone: [phone]; Fax: [phone]

## 2020-01-10 ENCOUNTER — Ambulatory Visit (INDEPENDENT_AMBULATORY_CARE_PROVIDER_SITE_OTHER): Payer: Medicare Other | Admitting: Cardiovascular Disease

## 2020-01-10 ENCOUNTER — Encounter: Payer: Self-pay | Admitting: Cardiovascular Disease

## 2020-01-10 ENCOUNTER — Other Ambulatory Visit: Payer: Self-pay

## 2020-01-10 VITALS — BP 128/74 | HR 59 | Ht 63.0 in | Wt 158.0 lb

## 2020-01-10 DIAGNOSIS — I10 Essential (primary) hypertension: Secondary | ICD-10-CM | POA: Diagnosis not present

## 2020-01-10 DIAGNOSIS — I482 Chronic atrial fibrillation, unspecified: Secondary | ICD-10-CM | POA: Diagnosis not present

## 2020-01-10 NOTE — Patient Instructions (Signed)
Medication Instructions:  Your physician recommends that you continue on your current medications as directed. Please refer to the Current Medication list given to you today.  **Please call [facility] to see if you qualify for help with the cost of Xarelto. [phone]  *If you need a refill on your cardiac medications before your next appointment, please call your pharmacy*   Lab Work: None Ordered If you have labs (blood work) drawn today and your tests are completely normal, you will receive your results only by: Marland Kitchen MyChart Message (if you have MyChart) OR . A paper copy in the mail If you have any lab test that is abnormal or we need to change your treatment, we will call you to review the results.    Testing/Procedures: None Ordered    Follow-Up: At [facility], you and your health needs are our priority.  As part of our continuing mission to provide you with exceptional heart care, we have created designated Provider Care Teams.  These Care Teams include your primary Cardiologist (physician) and Advanced Practice Providers (APPs -  Physician Assistants and Nurse Practitioners) who all work together to provide you with the care you need, when you need it.  We recommend signing up for the patient portal called "MyChart".  Sign up information is provided on this After Visit Summary.  MyChart is used to connect with patients for Virtual Visits (Telemedicine).  Patients are able to view lab/test results, encounter notes, upcoming appointments, etc.  Non-urgent messages can be sent to your provider as well.   To learn more about what you can do with MyChart, go to [email or web address].    Your next appointment:   1 year(s)  The format for your next appointment:   In Person  Provider:   You may see Mertie Moores, MD or one of the following Advanced Practice Providers on your designated Care Team:    Richardson Dopp, PA-C  [place], [place]

## 2020-04-03 DIAGNOSIS — R1310 Dysphagia, unspecified: Secondary | ICD-10-CM | POA: Diagnosis not present

## 2020-04-03 DIAGNOSIS — Z974 Presence of external hearing-aid: Secondary | ICD-10-CM | POA: Diagnosis not present

## 2020-04-03 DIAGNOSIS — H6123 Impacted cerumen, bilateral: Secondary | ICD-10-CM | POA: Diagnosis not present

## 2020-04-03 DIAGNOSIS — J310 Chronic rhinitis: Secondary | ICD-10-CM | POA: Diagnosis not present

## 2020-04-03 DIAGNOSIS — H903 Sensorineural hearing loss, bilateral: Secondary | ICD-10-CM | POA: Diagnosis not present

## 2020-04-10 DIAGNOSIS — I10 Essential (primary) hypertension: Secondary | ICD-10-CM | POA: Diagnosis not present

## 2020-04-10 DIAGNOSIS — Z23 Encounter for immunization: Secondary | ICD-10-CM | POA: Diagnosis not present

## 2020-04-10 DIAGNOSIS — E782 Mixed hyperlipidemia: Secondary | ICD-10-CM | POA: Diagnosis not present

## 2020-04-10 DIAGNOSIS — G4700 Insomnia, unspecified: Secondary | ICD-10-CM | POA: Diagnosis not present

## 2020-04-10 DIAGNOSIS — Z0000 Encounter for general adult medical examination without abnormal findings: Secondary | ICD-10-CM | POA: Diagnosis not present

## 2020-04-10 DIAGNOSIS — E039 Hypothyroidism, unspecified: Secondary | ICD-10-CM | POA: Diagnosis not present

## 2020-04-10 DIAGNOSIS — E11319 Type 2 diabetes mellitus with unspecified diabetic retinopathy without macular edema: Secondary | ICD-10-CM | POA: Diagnosis not present

## 2020-04-10 DIAGNOSIS — K5790 Diverticulosis of intestine, part unspecified, without perforation or abscess without bleeding: Secondary | ICD-10-CM | POA: Diagnosis not present

## 2020-04-10 DIAGNOSIS — K219 Gastro-esophageal reflux disease without esophagitis: Secondary | ICD-10-CM | POA: Diagnosis not present

## 2020-04-10 DIAGNOSIS — K589 Irritable bowel syndrome without diarrhea: Secondary | ICD-10-CM | POA: Diagnosis not present

## 2020-04-10 DIAGNOSIS — I4819 Other persistent atrial fibrillation: Secondary | ICD-10-CM | POA: Diagnosis not present

## 2020-04-10 DIAGNOSIS — F411 Generalized anxiety disorder: Secondary | ICD-10-CM | POA: Diagnosis not present

## 2020-05-02 ENCOUNTER — Other Ambulatory Visit: Payer: Self-pay | Admitting: Cardiovascular Disease

## 2020-05-02 NOTE — Telephone Encounter (Signed)
Prescription refill request for Xarelto received.   Indication: afib Last office visit: nahser, 01/10/2020 Weight: 71.7 kg Age: 87 yo Scr: 0.90, 09/21/2019 CrCl: 38 ml/min   Prescription refill sent.

## 2020-05-10 DIAGNOSIS — Z23 Encounter for immunization: Secondary | ICD-10-CM | POA: Diagnosis not present

## 2020-05-16 ENCOUNTER — Emergency Department (HOSPITAL_COMMUNITY)
Admission: EM | Admit: 2020-05-16 | Discharge: 2020-05-16 | Disposition: A | Discharge status: Home / Self Care | Payer: Medicare Other | Attending: Emergency Medicine | Admitting: Emergency Medicine

## 2020-05-16 ENCOUNTER — Emergency Department (HOSPITAL_COMMUNITY): Payer: Medicare Other

## 2020-05-16 DIAGNOSIS — E039 Hypothyroidism, unspecified: Secondary | ICD-10-CM | POA: Insufficient documentation

## 2020-05-16 DIAGNOSIS — Z7901 Long term (current) use of anticoagulants: Secondary | ICD-10-CM | POA: Diagnosis not present

## 2020-05-16 DIAGNOSIS — Z7984 Long term (current) use of oral hypoglycemic drugs: Secondary | ICD-10-CM | POA: Insufficient documentation

## 2020-05-16 DIAGNOSIS — I4891 Unspecified atrial fibrillation: Secondary | ICD-10-CM | POA: Insufficient documentation

## 2020-05-16 DIAGNOSIS — H538 Other visual disturbances: Secondary | ICD-10-CM | POA: Diagnosis not present

## 2020-05-16 DIAGNOSIS — R5383 Other fatigue: Secondary | ICD-10-CM | POA: Insufficient documentation

## 2020-05-16 DIAGNOSIS — I10 Essential (primary) hypertension: Secondary | ICD-10-CM | POA: Diagnosis not present

## 2020-05-16 DIAGNOSIS — R29810 Facial weakness: Secondary | ICD-10-CM | POA: Diagnosis not present

## 2020-05-16 DIAGNOSIS — E119 Type 2 diabetes mellitus without complications: Secondary | ICD-10-CM | POA: Insufficient documentation

## 2020-05-16 DIAGNOSIS — R531 Weakness: Secondary | ICD-10-CM | POA: Diagnosis not present

## 2020-05-16 DIAGNOSIS — R4781 Slurred speech: Secondary | ICD-10-CM | POA: Diagnosis not present

## 2020-05-16 DIAGNOSIS — Z79899 Other long term (current) drug therapy: Secondary | ICD-10-CM | POA: Diagnosis not present

## 2020-05-16 LAB — URINALYSIS, ROUTINE W REFLEX MICROSCOPIC
Bilirubin Urine: NEGATIVE
Glucose, UA: NEGATIVE mg/dL
Hgb urine dipstick: NEGATIVE
Ketones, ur: NEGATIVE mg/dL
Leukocytes,Ua: NEGATIVE
Nitrite: NEGATIVE
Protein, ur: NEGATIVE mg/dL
Specific Gravity, Urine: 1.013 (ref 1.005–1.030)
pH: 7 (ref 5.0–8.0)

## 2020-05-16 LAB — PROTIME-INR
INR: 1.4 — ABNORMAL HIGH (ref 0.8–1.2)
Prothrombin Time: 16.2 seconds — ABNORMAL HIGH (ref 11.4–15.2)

## 2020-05-16 LAB — I-STAT CHEM 8, ED
BUN: 19 mg/dL (ref 8–23)
Calcium, Ion: 1.12 mmol/L — ABNORMAL LOW (ref 1.15–1.40)
Chloride: 104 mmol/L (ref 98–111)
Creatinine, Ser: 0.8 mg/dL (ref 0.44–1.00)
Glucose, Bld: 108 mg/dL — ABNORMAL HIGH (ref 70–99)
HCT: 40 % (ref 36.0–46.0)
Hemoglobin: 13.6 g/dL (ref 12.0–15.0)
Potassium: 4.8 mmol/L (ref 3.5–5.1)
Sodium: 138 mmol/L (ref 135–145)
TCO2: 26 mmol/L (ref 22–32)

## 2020-05-16 LAB — BASIC METABOLIC PANEL
Anion gap: 10 (ref 5–15)
BUN: 16 mg/dL (ref 8–23)
CO2: 24 mmol/L (ref 22–32)
Calcium: 9.2 mg/dL (ref 8.9–10.3)
Chloride: 102 mmol/L (ref 98–111)
Creatinine, Ser: 0.82 mg/dL (ref 0.44–1.00)
GFR, Estimated: >60 mL/min (ref >60)
Glucose, Bld: 109 mg/dL — ABNORMAL HIGH (ref 70–99)
Potassium: 4.6 mmol/L (ref 3.5–5.1)
Sodium: 136 mmol/L (ref 135–145)

## 2020-05-16 LAB — APTT: aPTT: 38 seconds — ABNORMAL HIGH (ref 24–36)

## 2020-05-16 LAB — CBG MONITORING, ED: Glucose-Capillary: 97 mg/dL (ref 70–99)

## 2020-05-16 MED ORDER — SODIUM CHLORIDE 0.9% FLUSH
3.0000 mL | Freq: Once | INTRAVENOUS | Status: DC
Start: 2020-05-16 — End: 2020-05-17

## 2020-05-16 MED ORDER — AMLODIPINE BESYLATE 5 MG PO TABS: 5.0000 mg | ORAL_TABLET | Freq: Every day | ORAL | 0 refills | Status: DC

## 2020-05-16 NOTE — Discharge Instructions (Addendum)
Overall I recommend you starting amlodipine daily. However, if you would like to check your blood pressure at home twice a day for the next several days to see if you truly have high blood pressure do so. Start blood pressure medicine (amlodipine) if you are consistently over 150/90. Otherwise if your blood pressure on average is less than 150/90 you can continue your current regimen. I also encourage you to follow-up with your primary care doctor to make further decisions on your blood pressure medicine as well. Please return to the ED if symptoms worsen as discussed.

## 2020-05-16 NOTE — ED Notes (Signed)
E-signature pad unavailable at time of pt discharge. This RN discussed discharge materials with pt and answered all pt questions. Pt stated understanding of discharge material. ? ?

## 2020-05-16 NOTE — ED Triage Notes (Signed)
Pt arrives via EMS from home with stroke like symptoms, slurred speech, unable to read words around 9am resolved by 10. Pt states doesn't feel right now. Neg droop, drift, slurred speech, VAN neg. No distress noted. VSS. 172/96. Hr 60. 98%RA, cbg 215.

## 2020-05-16 NOTE — ED Provider Notes (Signed)
[place] EMERGENCY DEPARTMENT Provider Note   CSN: [id number] Arrival date & time: 05/16/20  1607     History Chief Complaint  Patient presents with  . Fatigue    Amanda [name] is a 86 y.o. female.  Patient with history of A. fib on Xarelto who presents the ED after episode of lightheadedness, difficulty with vision lasted several minutes but has now resolved.  This is about 12 hours ago.  Patient had no numbness, no weakness.  Questionable she is having any speech issues.  But overall symptoms resolved quickly.  She thought maybe it had something to do with her glasses.  Overall she has been asymptomatic in the waiting room.  The history is provided by the patient.  Illness Location:  General Severity:  Mild Onset quality:  Sudden Progression:  Resolved Chronicity:  New Relieved by:  Nothing Worsened by:  Nothing Associated symptoms: no abdominal pain, no chest pain, no congestion, no cough, no diarrhea, no ear pain, no fatigue, no fever, no headaches, no loss of consciousness, no myalgias, no nausea, no rash, no rhinorrhea, no shortness of breath, no sore throat, no vomiting and no wheezing        Past Medical History:  Diagnosis Date  . Carpal tunnel syndrome   . Chronic anticoagulation   . Chronic atrial fibrillation ([place])   . Diabetes mellitus    NON-INSULIN  . Hypothyroidism   . Sinus congestion     Patient Active Problem List   Diagnosis Date Noted  . Chronic a-fib ([place]) 01/05/2019  . Dysarthria 01/02/2019  . Aphasia 01/01/2019  . HTN (hypertension) 01/01/2019  . 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 Anemia 09/28/2015  . Vertigo   . Chronic anticoagulation 05/24/2014  . History of CVA (cerebrovascular accident) 05/23/2014  . Permanent atrial fibrillation ([place]) 05/23/2014  . Diabetes mellitus type 2, controlled ([place]) 05/23/2014  . Slurred speech   . Encounter for therapeutic drug monitoring 08/19/2013  . Benign  hypertensive heart disease without heart failure 10/09/2010  . Hypothyroidism 10/09/2010  . Atrial fibrillation ([place]) 09/03/2010    Past Surgical History:  Procedure Laterality Date  . APPENDECTOMY    . CARDIOVASCULAR STRESS TEST  07/10/2004   EF 81%  . CHOLECYSTECTOMY    . US ECHOCARDIOGRAPHY  12/06/2007   EF 55-60%     OB History   No obstetric history on file.     Family History  Problem Relation Age of Onset  . Stroke Mother   . Hypertension Mother   . Diabetes Sister   . Hypertension Other   . Heart attack Neg Hx     Social History   Tobacco Use  . Smoking status: Never Smoker  . Smokeless tobacco: Never Used  Vaping Use  . Vaping Use: Never used  Substance Use Topics  . Alcohol use: No    Alcohol/week: 0.0 standard drinks  . Drug use: No    Home Medications Prior to Admission medications   Medication Sig Start Date End Date Taking? Authorizing Provider  amLODipine (NORVASC) 5 MG tablet Take 1 tablet (5 mg total) by mouth daily. 05/16/20 06/15/20 Yes Tedford Berg, DO  acetaminophen (TYLENOL) 500 MG tablet Take 1,000 mg by mouth every 6 (six) hours as needed (for pain).    [provider]  Arnica 20 % TINC Apply 1 application topically daily as needed (for pain).     [provider]  Blood Glucose Monitoring Suppl (ONE TOUCH  ULTRA 2) w/Device KIT 1 Device by Other route daily. 08/02/19   [provider]  glimepiride (AMARYL) 1 MG tablet Take 1.5 mg by mouth daily with breakfast.     [provider]  Lancets (ONETOUCH ULTRASOFT) lancets 1 each by Other route daily. 10/04/19   [provider]  levothyroxine (SYNTHROID, LEVOTHROID) 88 MCG tablet Take 88 mcg by mouth daily.      [provider]  lisinopril (PRINIVIL,ZESTRIL) 10 MG tablet Take 10 mg by mouth daily.      [provider]  Magnesium 300 MG CAPS Take 1 capsule by mouth daily.    [provider]  meclizine (ANTIVERT) 25 MG tablet Take  one every 6-8 hours as needed for dizziness 09/04/15   Milton Ferguson, MD  Multiple Vitamins-Minerals (PRESERVISION AREDS 2 PO) Take 1 capsule by mouth 2 (two) times daily.     [provider]  Omega-3 Fatty Acids (FISH OIL) 1000 MG CAPS Take 1 capsule by mouth every morning.    [provider]  ondansetron (ZOFRAN-ODT) 4 MG disintegrating tablet Take 4 mg by mouth every 4 (four) hours as needed for nausea or vomiting.    [provider]  [facility] ULTRA test strip 1 each by Other route daily. 10/04/19   [provider]  Probiotic Product (PROBIOTIC DAILY PO) Take 2 tablets by mouth daily as needed (DIGESTION).     [provider]  sodium chloride (OCEAN) 0.65 % nasal spray Place 1 spray into the nose daily as needed.    [provider]  Wheat Dextrin (BENEFIBER DRINK MIX PO) Take by mouth daily.    [provider]  XARELTO 15 MG TABS tablet TAKE 1 TABLET(15 MG) BY MOUTH DAILY WITH SUPPER 05/02/20   Nahser, Wonda Cheng, MD    Allergies    Other, Tape, Codeine, Hydrocodone, Metoprolol, Rythmol [propafenone hcl], Shellfish allergy, and Sulfa antibiotics  Review of Systems   Review of Systems  Constitutional: Negative for chills, fatigue and fever.  HENT: Negative for congestion, ear pain, rhinorrhea and sore throat.   Eyes: Positive for visual disturbance (blurred vision). Negative for pain.  Respiratory: Negative for cough, shortness of breath and wheezing.   Cardiovascular: Negative for chest pain and palpitations.  Gastrointestinal: Negative for abdominal pain, diarrhea, nausea and vomiting.  Genitourinary: Negative for dysuria and hematuria.  Musculoskeletal: Negative for arthralgias, back pain and myalgias.  Skin: Negative for color change and rash.  Neurological: Positive for light-headedness. Negative for dizziness, tremors, seizures, loss of consciousness, syncope, facial asymmetry, speech difficulty, weakness, numbness and  headaches.  All other systems reviewed and are negative.   Physical Exam Updated Vital Signs  ED Triage Vitals  Enc Vitals Group     BP 05/16/20 1738 (!) 179/87     Pulse Rate 05/16/20 1738 65     Resp 05/16/20 1738 16     Temp 05/16/20 1738 98.4 F (36.9 C)     Temp Source 05/16/20 1738 Oral     SpO2 05/16/20 1738 99 %     Weight --      Height --      Head Circumference --      Peak Flow --      Pain Score 05/16/20 1525 0     Pain Loc --      Pain Edu? --      Excl. in [place]? --     Physical Exam Vitals and nursing note reviewed.  Constitutional:  General: She is not in acute distress.    Appearance: She is well-developed and well-nourished. She is not ill-appearing.  HENT:     Head: Normocephalic and atraumatic.     Nose: Nose normal.     Mouth/Throat:     Mouth: Mucous membranes are moist.  Eyes:     Extraocular Movements: Extraocular movements intact.     Conjunctiva/sclera: Conjunctivae normal.     Pupils: Pupils are equal, round, and reactive to light.  Cardiovascular:     Rate and Rhythm: Normal rate and regular rhythm.     Pulses: Normal pulses.     Heart sounds: Normal heart sounds. No murmur heard.   Pulmonary:     Effort: Pulmonary effort is normal. No respiratory distress.     Breath sounds: Normal breath sounds.  Abdominal:     Palpations: Abdomen is soft.     Tenderness: There is no abdominal tenderness.  Musculoskeletal:        General: No edema.     Cervical back: Normal range of motion and neck supple.  Skin:    General: Skin is warm and dry.     Capillary Refill: Capillary refill takes less than 2 seconds.  Neurological:     General: No focal deficit present.     Mental Status: She is alert and oriented to person, place, and time.     Cranial Nerves: No cranial nerve deficit.     Sensory: No sensory deficit.     Motor: No weakness.     Coordination: Coordination normal.     Gait: Gait normal.     Comments: 5+ out of 5 strength  throughout, normal sensation, no drift, normal finger-nose-finger, normal speech  Psychiatric:        Mood and Affect: Mood and affect and mood normal.     ED Results / Procedures / Treatments   Labs (all labs ordered are listed, but only abnormal results are displayed) Labs Reviewed  BASIC METABOLIC PANEL - Abnormal; Notable for the following components:      Result Value   Glucose, Bld 109 (*)    All other components within normal limits  PROTIME-INR - Abnormal; Notable for the following components:   Prothrombin Time 16.2 (*)    INR 1.4 (*)    All other components within normal limits  APTT - Abnormal; Notable for the following components:   aPTT 38 (*)    All other components within normal limits  I-STAT CHEM 8, ED - Abnormal; Notable for the following components:   Glucose, Bld 108 (*)    Calcium, Ion 1.12 (*)    All other components within normal limits  URINALYSIS, ROUTINE W REFLEX MICROSCOPIC  CBG MONITORING, ED    EKG EKG Interpretation  Date/Time:  Wednesday May 16 2020 22:16:37 EST Ventricular Rate:  65 PR Interval:    QRS Duration: 90 QT Interval:  439 QTC Calculation: 457 R Axis:   101 Text Interpretation: Atrial fibrillation Right axis deviation Anteroseptal infarct, old Borderline repolarization abnormality Confirmed by Lennice Sites [id number]) on 05/16/2020 10:31:42 PM   Radiology CT HEAD WO CONTRAST  Result Date: 05/16/2020 CLINICAL DATA:  Slurred speech EXAM: CT HEAD WITHOUT CONTRAST TECHNIQUE: Contiguous axial images were obtained from the base of the skull through the vertex without intravenous contrast. COMPARISON:  MRI 01/01/2019, CT brain 12/31/2018 FINDINGS: Brain: No acute territorial infarction, hemorrhage, or intracranial mass. Mild atrophy. Mild hypodensity in the white matter consistent with chronic small vessel ischemic change.  Small chronic infarct within the deep white matter adjacent to the right frontal horn. Stable ventricle size.  Vascular: No hyperdense vessels.  Carotid vascular calcification. Skull: Normal. Negative for fracture or focal lesion. Sinuses/Orbits: No acute finding. Other: None IMPRESSION: 1. No CT evidence for acute intracranial abnormality. 2. Atrophy and mild chronic small vessel ischemic changes of the white matter. Electronically Signed   By: Donavan Foil M.D.   On: 05/16/2020 18:36    Procedures Procedures (including critical care time)  Medications Ordered in ED Medications  sodium chloride flush (NS) 0.9 % injection 3 mL (has no administration in time range)    ED Course  I have reviewed the triage vital signs and the nursing notes.  Pertinent labs & imaging results that were available during my care of the patient were reviewed by me and considered in my medical decision making (see chart for details).    MDM Rules/Calculators/A&P                          LOUELLA MEDAGLIA is a 86 year old female with history of atrial fibrillation on Xarelto who presents to the ED with fatigue, episode of blurred vision.  Patient with mildly elevated blood pressure in the 528 systolic but otherwise normal vitals.  She had several minutes of some difficulty with focusing and blurred vision.  Maybe she had slurred speech but denied any weakness or numbness.  Patient has been good about taking her medications.  She is neurologically intact on exam.  She has already had lab work including head CT and EKG.  There is no head bleed on head CT.  She has rate controlled atrial fibrillation on EKG.  She has no significant anemia, electrolyte abnormality, kidney injury.  No leukocytosis.  She has no infectious symptoms.  Negative for UTI.  Overall had a discussion with her and her son about whether or not this was a TIA.  She had a stroke work-up recently in 2020 with MRI that was overall unremarkable.  Possible that this was a TIA but could also have been stress related or vasovagal related.  She has no chest pain or  shortness of breath.  Had discussion with them about a possible admission for TIA work-up or MRI or other further stroke work-up but overall shared decision was made to start her on new blood pressure medicine/have her check her blood pressure medicine at home and if she is consistently over 150/90 to start taking amlodipine/follow-up with her primary care doctor.  Overall they did not want a pursue much more of a work-up given her age and that she was doing well clinically at this moment.  I think that this is reasonable as I have a low suspicion that this was a TIA but she is already fairly medically managed for stroke prevention as she is on Xarelto and blood pressure medications.  She did have a recent stroke work-up as well.  They understand the risks and benefits of staying versus leaving and overall shared decision was made to have her follow-up with her primary care doctor.  They understand return precautions and patient was discharged from ED in good condition.  This chart was dictated using voice recognition software.  Despite best efforts to proofread,  errors can occur which can change the documentation meaning.    Final Clinical Impression(s) / ED Diagnoses Final diagnoses:  Fatigue, unspecified type  Blurred vision    Rx / DC Orders ED  Discharge Orders         Ordered    amLODipine (NORVASC) 5 MG tablet  Daily        05/16/20 2233           Lennice Sites, DO 05/16/20 2254

## 2020-05-23 DIAGNOSIS — R519 Headache, unspecified: Secondary | ICD-10-CM | POA: Diagnosis not present

## 2020-05-23 DIAGNOSIS — I10 Essential (primary) hypertension: Secondary | ICD-10-CM | POA: Diagnosis not present

## 2020-05-23 DIAGNOSIS — Z20828 Contact with and (suspected) exposure to other viral communicable diseases: Secondary | ICD-10-CM | POA: Diagnosis not present

## 2020-05-23 DIAGNOSIS — I4819 Other persistent atrial fibrillation: Secondary | ICD-10-CM | POA: Diagnosis not present

## 2020-05-28 DIAGNOSIS — I10 Essential (primary) hypertension: Secondary | ICD-10-CM | POA: Diagnosis not present

## 2020-10-12 DIAGNOSIS — E11319 Type 2 diabetes mellitus with unspecified diabetic retinopathy without macular edema: Secondary | ICD-10-CM | POA: Diagnosis not present

## 2020-10-12 DIAGNOSIS — E039 Hypothyroidism, unspecified: Secondary | ICD-10-CM | POA: Diagnosis not present

## 2020-10-12 DIAGNOSIS — E782 Mixed hyperlipidemia: Secondary | ICD-10-CM | POA: Diagnosis not present

## 2020-10-12 DIAGNOSIS — I10 Essential (primary) hypertension: Secondary | ICD-10-CM | POA: Diagnosis not present

## 2020-10-12 DIAGNOSIS — I4819 Other persistent atrial fibrillation: Secondary | ICD-10-CM | POA: Diagnosis not present

## 2020-10-23 DIAGNOSIS — H6123 Impacted cerumen, bilateral: Secondary | ICD-10-CM | POA: Diagnosis not present

## 2020-10-23 DIAGNOSIS — H903 Sensorineural hearing loss, bilateral: Secondary | ICD-10-CM | POA: Diagnosis not present

## 2020-11-28 ENCOUNTER — Other Ambulatory Visit: Payer: Self-pay

## 2020-11-28 ENCOUNTER — Encounter (INDEPENDENT_AMBULATORY_CARE_PROVIDER_SITE_OTHER): Payer: Medicare Other | Admitting: Ophthalmology

## 2020-11-28 DIAGNOSIS — H35033 Hypertensive retinopathy, bilateral: Secondary | ICD-10-CM | POA: Diagnosis not present

## 2020-11-28 DIAGNOSIS — E113392 Type 2 diabetes mellitus with moderate nonproliferative diabetic retinopathy without macular edema, left eye: Secondary | ICD-10-CM | POA: Diagnosis not present

## 2020-11-28 DIAGNOSIS — H35343 Macular cyst, hole, or pseudohole, bilateral: Secondary | ICD-10-CM | POA: Diagnosis not present

## 2020-11-28 DIAGNOSIS — H4423 Degenerative myopia, bilateral: Secondary | ICD-10-CM

## 2020-11-28 DIAGNOSIS — I10 Essential (primary) hypertension: Secondary | ICD-10-CM | POA: Diagnosis not present

## 2020-11-28 DIAGNOSIS — H43813 Vitreous degeneration, bilateral: Secondary | ICD-10-CM | POA: Diagnosis not present

## 2021-01-21 ENCOUNTER — Encounter: Payer: Self-pay | Admitting: Cardiovascular Disease

## 2021-01-21 NOTE — Progress Notes (Signed)
Cardiology Office Note   Date:  01/23/2021   ID:  Amanda [name], DOB May 15, 1919, MRN [id number]  PCP:  Mayra Neer, MD  Cardiologist: Darlin Coco MD - now Jamarii Banks   Chief Complaint  Patient presents with   Atrial Fibrillation   Problem List 1. Chronic atrial fib 2. CVA 3. Diabetes mellitus 4. Essential HTN 5. Hypothyroidism     Amanda [name] is a 87 y.o. female who presents for a scheduled follow-up visit.  This pleasant 87 year old lady is seen for a scheduled followup office visit. She has a history of established atrial fibrillation and is on long-term anticoagulation.. She does not have any history of ischemic heart disease. She had a normal nuclear stress test in 2006. She had an echocardiogram in 2009 which showed mild left atrial enlargement. The patient has a history of essential hypertension and diabetes and hypothyroidism. Since last visit she has been feeling well. She continues to live by herself in her own home. She still drives herself to her appointments and to the grocery store.   The patient had a recent hospital admission for 2 strokes which left her with initial difficulty with speaking. Her speech has subsequently returned to normal.  The patient is now on Xarelto rather than warfarin.  She denies any new cardiac symptoms.  No chest pain.  No shortness of breath.  No dizziness or syncope.  No awareness of rapid palpitations.  She still drives and lives by herself.    She has occasional episodes of dizziness.  No syncope.  She has not been having any chest pain or increased shortness of breath.  She has not had any recurrent episodes of stroke or TIA   Oct 10, 2015:  Amanda [name] is seen for the 1st time - transfer from [place].   Seen with daughter in law - [name] .  Was hospitalized several weeks ago with vertigo  Has chronic A-fib - is on Xarelto 15 mg a day - we reduced the dose recently   Still lives alone,    Gets help with grocery  shopping .   Nov. 20, 2017:  Amanda [name] is seen back to day for her atrial fib. BP is a little elevated.   Has been under some stress.  Is not driving anymore.  Feb. 2, 2018:  Seen with daughter in law, [name] She fell and injured her knee.   Her medical doctor has stopped her Xarelto until she was seen here today   March 17, 2018: Amanda [name] is seen for follow-up visit with a friend from [place].     She has atrial fibrillation. On Xarelto 15 mg a day - her creatinine cl. Is 22.  Doing well from a cardiac standpoint   Aug. 19, 2020   Amanda [name] is seen today .  She was recently hospitalized with aphasia.  She has chronic AF.   Is on xarelto 15 mg a day   MRI did not reveal any acute stroke ,  There was concern about seizures.  She thinks her episode was due to low glucose . - but glucose levels in the hospital were high / normal     Aug. 24, 2021:  Amanda [name] is seen today .   She has chronic AFib.   She is 87 years old.  Doing well Will be 87 years old next year Her kids are already planning her 89th birthday  Has chronic Afib No syncope or presyncope  No cp or  dyspnea  On Xarelto , no blood in stool  Sept. 7, 2022: Amanda [name] is seen today for her chronic afib. Is 87 yo  Seen with nephew , Al She denies any CP, excessive dysnea . Cannot tell that her HR is irreg   Past Medical History:  Diagnosis Date   Carpal tunnel syndrome    Chronic anticoagulation    Chronic atrial fibrillation ([facility])    Diabetes mellitus    NON-INSULIN   Hypothyroidism    Sinus congestion     Past Surgical History:  Procedure Laterality Date   APPENDECTOMY     CARDIOVASCULAR STRESS TEST  07/10/2004   EF 81%   CHOLECYSTECTOMY     US ECHOCARDIOGRAPHY  12/06/2007   EF 55-60%     Current Outpatient Medications  Medication Sig Dispense Refill   acetaminophen (TYLENOL) 500 MG tablet Take 1,000 mg by mouth every 6 (six) hours as needed (for pain).     Arnica 20 % TINC Apply 1  application topically daily as needed (for pain).      Blood Glucose Monitoring Suppl (ONE TOUCH ULTRA 2) w/Device KIT 1 Device by Other route daily.     glimepiride (AMARYL) 1 MG tablet Take 1.5 mg by mouth daily with breakfast.      Lancets (ONETOUCH ULTRASOFT) lancets 1 each by Other route daily.     levothyroxine (SYNTHROID, LEVOTHROID) 88 MCG tablet Take 88 mcg by mouth daily.       lisinopril (ZESTRIL) 20 MG tablet Take 20 mg by mouth daily.     Magnesium 300 MG CAPS Take 1 capsule by mouth daily.     meclizine (ANTIVERT) 25 MG tablet Take one every 6-8 hours as needed for dizziness 20 tablet 0   Multiple Vitamins-Minerals (PRESERVISION AREDS 2 PO) Take 1 capsule by mouth 2 (two) times daily.      Niacin (VITAMIN B-3 PO) Take 1 tablet by mouth daily.     Omega-3 Fatty Acids (FISH OIL) 1000 MG CAPS Take 1 capsule by mouth every morning.     ondansetron (ZOFRAN-ODT) 4 MG disintegrating tablet Take 4 mg by mouth every 4 (four) hours as needed for nausea or vomiting.     ONETOUCH ULTRA test strip 1 each by Other route daily.     OVER THE COUNTER MEDICATION Take 1 tablet by mouth as needed. Allerclear allergy tab     Probiotic Product (PROBIOTIC DAILY PO) Take 2 tablets by mouth daily as needed (DIGESTION).      sodium chloride (OCEAN) 0.65 % nasal spray Place 1 spray into the nose daily as needed.     vitamin B-12 (CYANOCOBALAMIN) 100 MCG tablet Take 100 mcg by mouth daily.     Wheat Dextrin (BENEFIBER DRINK MIX PO) Take by mouth daily.     XARELTO 15 MG TABS tablet TAKE 1 TABLET(15 MG) BY MOUTH DAILY WITH SUPPER 30 tablet 5   amLODipine (NORVASC) 5 MG tablet Take 1 tablet (5 mg total) by mouth daily. 30 tablet 0   No current facility-administered medications for this visit.    Allergies:   Other, Tape, Codeine, Hydrocodone, Metoprolol, Rythmol [propafenone hcl], Shellfish allergy, and Sulfa antibiotics    Social History:  The patient  reports that she has never smoked. She has never  used smokeless tobacco. She reports that she does not drink alcohol and does not use drugs.   Family History:  The patient's family history includes Diabetes in her sister; Hypertension in her mother and another family  member; Stroke in her mother.    ROS:  Please see the history of present illness.   Otherwise, review of systems are positive for none.   All other systems are reviewed and negative.   Physical Exam: Blood pressure (!) 148/90, pulse 69, weight 157 lb (71.2 kg).  GEN:  Well nourished, well developed in no acute distress HEENT: Normal NECK: No JVD; No carotid bruits LYMPHATICS: No lymphadenopathy CARDIAC:  irreg. Irreg. , no murmurs, rubs, gallops RESPIRATORY:  Clear to auscultation without rales, wheezing or rhonchi  ABDOMEN: Soft, non-tender, non-distended MUSCULOSKELETAL:  No edema; No deformity  SKIN: Warm and dry NEUROLOGIC:  Alert and oriented x 3    EKG:    Sept. 7,  2022    - Atrial fib with 69.  Occaional PVCs  .   Recent Labs: 05/16/2020: BUN 19; Creatinine, Ser 0.80; Hemoglobin 13.6; Potassium 4.8; Sodium 138    Lipid Panel    Component Value Date/Time   CHOL 187 01/01/2019 0615   TRIG 55 01/01/2019 0615   HDL 57 01/01/2019 0615   CHOLHDL 3.3 01/01/2019 0615   VLDL 11 01/01/2019 0615   LDLCALC 119 (H) 01/01/2019 0615   LDLDIRECT 137.0 01/06/2012 1211      Wt Readings from Last 3 Encounters:  01/23/21 157 lb (71.2 kg)  01/10/20 158 lb (71.7 kg)  01/05/19 158 lb 12.8 oz (72 kg)        ASSESSMENT AND PLAN:  1.  Permanent atrial fibrillation-   Cont xarelto 15 mg a day  HR is well controlled.      2. essential hypertension without congestive heart failure  Cont amlodipine and lisinopril      5.  Hx  Of stroke felt to be embolic - was changed from coumadin to xarelto    Current medicines are reviewed at length with the patient today.  The patient does not have concerns regarding medicines.  The following changes have been made:   no change  Labs/ tests ordered today include:   Orders Placed This Encounter  Procedures   EKG 12-Lead      Disposition: No change in meds. Will see her in 1 year.       Mertie Moores, MD  01/23/2021 2:36 PM    [address] [place],  [place], [place]  [address] Pager [phone] Phone: [phone]; Fax: [phone]

## 2021-01-23 ENCOUNTER — Other Ambulatory Visit: Payer: Self-pay

## 2021-01-23 ENCOUNTER — Ambulatory Visit (INDEPENDENT_AMBULATORY_CARE_PROVIDER_SITE_OTHER): Payer: Medicare Other | Admitting: Cardiovascular Disease

## 2021-01-23 VITALS — BP 148/90 | HR 69 | Wt 157.0 lb

## 2021-01-23 DIAGNOSIS — I482 Chronic atrial fibrillation, unspecified: Secondary | ICD-10-CM | POA: Diagnosis not present

## 2021-01-23 DIAGNOSIS — I10 Essential (primary) hypertension: Secondary | ICD-10-CM

## 2021-01-23 NOTE — Patient Instructions (Signed)

## 2021-02-19 DIAGNOSIS — H6122 Impacted cerumen, left ear: Secondary | ICD-10-CM | POA: Diagnosis not present

## 2021-03-23 DIAGNOSIS — Z23 Encounter for immunization: Secondary | ICD-10-CM | POA: Diagnosis not present

## 2021-04-08 DIAGNOSIS — R040 Epistaxis: Secondary | ICD-10-CM | POA: Diagnosis not present

## 2021-04-08 DIAGNOSIS — J069 Acute upper respiratory infection, unspecified: Secondary | ICD-10-CM | POA: Diagnosis not present

## 2021-04-10 ENCOUNTER — Telehealth: Payer: Self-pay | Admitting: Cardiovascular Disease

## 2021-04-10 NOTE — Telephone Encounter (Signed)
Spoke with the patient who reports that she has been having nose bleeds for about a week now. She states that there is some bleeding now but its just when she wipes her nose, not a constant bleed. Patient did see her ENT provider yesterday. Patient has had a cold and they advised her to use cough medications, mucinex, and nasal saline spray. Patient would like to know if he can hold Xarelto for a bit to see if bleeding resolves.Advised that I would send a message to Dr. Acie Fredrickson. Also encouraged patient on applying pressure and cold pack to her nose to help with bleeding. Patient verbalized understanding.

## 2021-04-10 NOTE — Telephone Encounter (Signed)
Spoke with the patient and advised her that per Dr. Acie Fredrickson she can hold her xarelto for 5 days. Patient verbalized understanding.

## 2021-04-10 NOTE — Telephone Encounter (Signed)
Pt c/o medication issue:  1. Name of Medication: Xarelto  2. How are you currently taking this medication (dosage and times per day)? 1 time a day  3. Are you having a reaction (difficulty breathing--STAT)?   4. What is your medication issue? Nose bleeding constantly

## 2021-04-15 DIAGNOSIS — J189 Pneumonia, unspecified organism: Secondary | ICD-10-CM | POA: Diagnosis not present

## 2021-04-15 DIAGNOSIS — R059 Cough, unspecified: Secondary | ICD-10-CM | POA: Diagnosis not present

## 2021-04-15 DIAGNOSIS — J3489 Other specified disorders of nose and nasal sinuses: Secondary | ICD-10-CM | POA: Diagnosis not present

## 2021-04-30 DIAGNOSIS — I4819 Other persistent atrial fibrillation: Secondary | ICD-10-CM | POA: Diagnosis not present

## 2021-04-30 DIAGNOSIS — D6869 Other thrombophilia: Secondary | ICD-10-CM | POA: Diagnosis not present

## 2021-04-30 DIAGNOSIS — E039 Hypothyroidism, unspecified: Secondary | ICD-10-CM | POA: Diagnosis not present

## 2021-04-30 DIAGNOSIS — M8580 Other specified disorders of bone density and structure, unspecified site: Secondary | ICD-10-CM | POA: Diagnosis not present

## 2021-04-30 DIAGNOSIS — F411 Generalized anxiety disorder: Secondary | ICD-10-CM | POA: Diagnosis not present

## 2021-04-30 DIAGNOSIS — E11319 Type 2 diabetes mellitus with unspecified diabetic retinopathy without macular edema: Secondary | ICD-10-CM | POA: Diagnosis not present

## 2021-04-30 DIAGNOSIS — K589 Irritable bowel syndrome without diarrhea: Secondary | ICD-10-CM | POA: Diagnosis not present

## 2021-04-30 DIAGNOSIS — K5790 Diverticulosis of intestine, part unspecified, without perforation or abscess without bleeding: Secondary | ICD-10-CM | POA: Diagnosis not present

## 2021-04-30 DIAGNOSIS — M150 Primary generalized (osteo)arthritis: Secondary | ICD-10-CM | POA: Diagnosis not present

## 2021-04-30 DIAGNOSIS — Z0000 Encounter for general adult medical examination without abnormal findings: Secondary | ICD-10-CM | POA: Diagnosis not present

## 2021-04-30 DIAGNOSIS — E782 Mixed hyperlipidemia: Secondary | ICD-10-CM | POA: Diagnosis not present

## 2021-04-30 DIAGNOSIS — I10 Essential (primary) hypertension: Secondary | ICD-10-CM | POA: Diagnosis not present

## 2021-05-28 ENCOUNTER — Other Ambulatory Visit: Payer: Self-pay | Admitting: Cardiovascular Disease

## 2021-05-28 NOTE — Telephone Encounter (Signed)
Pt last saw Dr Acie Fredrickson 01/23/21, last labs 04/30/21 Creat 0.91 at [facility] per [facility], age 88, weight 71.2kg,  CrCl 36.95, based on CrCl pt is on appropriate dosage of Xarelto 15mg  QD for afib.  Will refill rx.

## 2021-06-07 DIAGNOSIS — G4700 Insomnia, unspecified: Secondary | ICD-10-CM | POA: Diagnosis not present

## 2021-06-07 DIAGNOSIS — G2581 Restless legs syndrome: Secondary | ICD-10-CM | POA: Diagnosis not present

## 2021-06-07 DIAGNOSIS — J309 Allergic rhinitis, unspecified: Secondary | ICD-10-CM | POA: Diagnosis not present

## 2021-09-17 DIAGNOSIS — Z974 Presence of external hearing-aid: Secondary | ICD-10-CM | POA: Diagnosis not present

## 2021-09-17 DIAGNOSIS — H903 Sensorineural hearing loss, bilateral: Secondary | ICD-10-CM | POA: Diagnosis not present

## 2021-09-17 DIAGNOSIS — H6123 Impacted cerumen, bilateral: Secondary | ICD-10-CM | POA: Diagnosis not present

## 2021-11-13 DIAGNOSIS — G5600 Carpal tunnel syndrome, unspecified upper limb: Secondary | ICD-10-CM | POA: Diagnosis not present

## 2021-11-13 DIAGNOSIS — I4819 Other persistent atrial fibrillation: Secondary | ICD-10-CM | POA: Diagnosis not present

## 2021-11-13 DIAGNOSIS — E782 Mixed hyperlipidemia: Secondary | ICD-10-CM | POA: Diagnosis not present

## 2021-11-13 DIAGNOSIS — D6869 Other thrombophilia: Secondary | ICD-10-CM | POA: Diagnosis not present

## 2021-11-13 DIAGNOSIS — E039 Hypothyroidism, unspecified: Secondary | ICD-10-CM | POA: Diagnosis not present

## 2021-11-13 DIAGNOSIS — E11319 Type 2 diabetes mellitus with unspecified diabetic retinopathy without macular edema: Secondary | ICD-10-CM | POA: Diagnosis not present

## 2021-11-13 DIAGNOSIS — I10 Essential (primary) hypertension: Secondary | ICD-10-CM | POA: Diagnosis not present

## 2021-11-13 DIAGNOSIS — M1990 Unspecified osteoarthritis, unspecified site: Secondary | ICD-10-CM | POA: Diagnosis not present

## 2021-12-04 ENCOUNTER — Encounter (INDEPENDENT_AMBULATORY_CARE_PROVIDER_SITE_OTHER): Payer: Medicare Other | Admitting: Ophthalmology

## 2022-01-26 ENCOUNTER — Encounter: Payer: Self-pay | Admitting: Cardiovascular Disease

## 2022-01-26 NOTE — Progress Notes (Unsigned)
Cardiology Office Note   Date:  01/27/2022   ID:  Amanda [name], DOB 01-22-1919, MRN [id number]  PCP:  Mayra Neer, MD  Cardiologist: Darlin Coco MD - now Vedder Brittian   Chief Complaint  Patient presents with   Atrial Fibrillation        Problem List 1. Chronic atrial fib 2. CVA 3. Diabetes mellitus 4. Essential HTN 5. Hypothyroidism     SHEYLA [name] is a 88 y.o. female who presents for a scheduled follow-up visit.  This pleasant 88 year old lady is seen for a scheduled followup office visit. She has a history of established atrial fibrillation and is on long-term anticoagulation.. She does not have any history of ischemic heart disease. She had a normal nuclear stress test in 2006. She had an echocardiogram in 2009 which showed mild left atrial enlargement. The patient has a history of essential hypertension and diabetes and hypothyroidism. Since last visit she has been feeling well. She continues to live by herself in her own home. She still drives herself to her appointments and to the grocery store.   The patient had a recent hospital admission for 2 strokes which left her with initial difficulty with speaking. Her speech has subsequently returned to normal.  The patient is now on Xarelto rather than warfarin.  She denies any new cardiac symptoms.  No chest pain.  No shortness of breath.  No dizziness or syncope.  No awareness of rapid palpitations.  She still drives and lives by herself.    She has occasional episodes of dizziness.  No syncope.  She has not been having any chest pain or increased shortness of breath.  She has not had any recurrent episodes of stroke or TIA   Oct 10, 2015:  [name] is seen for the 1st time - transfer from [place].   Seen with daughter in law - Amanda [name] .  Was hospitalized several weeks ago with vertigo  Has chronic A-fib - is on Xarelto 15 mg a day - we reduced the dose recently   Still lives alone,    Gets help with  grocery shopping .   Nov. 20, 2017:  Ms [name] is seen back to day for her atrial fib. BP is a little elevated.   Has been under some stress.  Is not driving anymore.  Feb. 2, 2018:  Seen with daughter in law, Amanda [name] She fell and injured her knee.   Her medical doctor has stopped her Xarelto until she was seen here today   March 17, 2018: Amanda [name] is seen for follow-up visit with a friend from [place].     She has atrial fibrillation. On Xarelto 15 mg a day - her creatinine cl. Is 72.  Doing well from a cardiac standpoint   Aug. 19, 2020   Amanda [name] is seen today .  She was recently hospitalized with aphasia.  She has chronic AF.   Is on xarelto 15 mg a day   MRI did not reveal any acute stroke ,  There was concern about seizures.  She thinks her episode was due to low glucose . - but glucose levels in the hospital were high / normal     Aug. 24, 2021:  Amanda [name] is seen today .   She has chronic AFib.   She is 88 years old.  Doing well Will be 88 years old next year Her kids are already planning her 89th birthday  Has chronic Afib No syncope or  presyncope  No cp or dyspnea  On Xarelto , no blood in stool  Sept. 7, 2022: Amanda [name] is seen today for her chronic afib. Is 88 yo  Seen with nephew , Al She denies any CP, excessive dysnea . Cannot tell that her HR is irreg   Sept.. 11, 2023 Amanda [name] is seen for follow up of her atrial fib Seen with her friend, Amanda [name].   No specific complaints.  Her children take turns bringing her food.  She does not eat a lot of processed meats or canned foods.  She has some chronic leg edema that is really just a nuisance.  She her legs are not painful.  She walks with the assistance of a cane.  In her house she uses a walker.  She denies any chest pain or shortness of breath.   Past Medical History:  Diagnosis Date   Carpal tunnel syndrome    Chronic anticoagulation    Chronic atrial fibrillation (HCC)    Diabetes mellitus     NON-INSULIN   Hypothyroidism    Sinus congestion     Past Surgical History:  Procedure Laterality Date   APPENDECTOMY     CARDIOVASCULAR STRESS TEST  07/10/2004   EF 81%   CHOLECYSTECTOMY     US ECHOCARDIOGRAPHY  12/06/2007   EF 55-60%     Current Outpatient Medications  Medication Sig Dispense Refill   acetaminophen (TYLENOL) 500 MG tablet Take 1,000 mg by mouth every 6 (six) hours as needed (for pain).     amLODipine (NORVASC) 5 MG tablet Take 1 tablet (5 mg total) by mouth daily. 30 tablet 0   Arnica 20 % TINC Apply 1 application topically daily as needed (for pain).      Blood Glucose Monitoring Suppl (ONE TOUCH ULTRA 2) w/Device KIT 1 Device by Other route daily.     glimepiride (AMARYL) 1 MG tablet Take 1.5 mg by mouth daily with breakfast.      Lancets (ONETOUCH ULTRASOFT) lancets 1 each by Other route daily.     levothyroxine (SYNTHROID, LEVOTHROID) 88 MCG tablet Take 88 mcg by mouth daily.       lisinopril (ZESTRIL) 20 MG tablet Take 20 mg by mouth daily.     Magnesium 300 MG CAPS Take 1 capsule by mouth daily.     meclizine (ANTIVERT) 25 MG tablet Take one every 6-8 hours as needed for dizziness 20 tablet 0   Multiple Vitamins-Minerals (PRESERVISION AREDS 2 PO) Take 1 capsule by mouth 2 (two) times daily.      Niacin (VITAMIN B-3 PO) Take 1 tablet by mouth daily.     Omega-3 Fatty Acids (FISH OIL) 1000 MG CAPS Take 1 capsule by mouth every morning.     ondansetron (ZOFRAN-ODT) 4 MG disintegrating tablet Take 4 mg by mouth every 4 (four) hours as needed for nausea or vomiting.     ONETOUCH ULTRA test strip 1 each by Other route daily.     OVER THE COUNTER MEDICATION Take 1 tablet by mouth as needed. Allerclear allergy tab     Probiotic Product (PROBIOTIC DAILY PO) Take 2 tablets by mouth daily as needed (DIGESTION).      sodium chloride (OCEAN) 0.65 % nasal spray Place 1 spray into the nose daily as needed.     vitamin B-12 (CYANOCOBALAMIN) 100 MCG tablet Take 100 mcg  by mouth daily.     Wheat Dextrin (BENEFIBER DRINK MIX PO) Take by mouth daily.     XARELTO 15  MG TABS tablet TAKE 1 TABLET(15 MG) BY MOUTH DAILY WITH SUPPER 30 tablet 5   No current facility-administered medications for this visit.    Allergies:   Other, Tape, Codeine, Hydrocodone, Metoprolol, Rythmol [propafenone hcl], Shellfish allergy, and Sulfa antibiotics    Social History:  The patient  reports that she has never smoked. She has never used smokeless tobacco. She reports that she does not drink alcohol and does not use drugs.   Family History:  The patient's family history includes Diabetes in her sister; Hypertension in her mother and another family member; Stroke in her mother.    ROS:  Please see the history of present illness.   Otherwise, review of systems are positive for none.   All other systems are reviewed and negative.    Physical Exam: Blood pressure (!) 144/78, pulse 62, height '5\' 3"'  (1.6 m), weight 154 lb 12.8 oz (70.2 kg), SpO2 94 %.  BP slightly higher than normal - probably ok for this patient    GEN:  elderly female ,  in no acute distress HEENT: Normal NECK: No JVD; No carotid bruits LYMPHATICS: No lymphadenopathy CARDIAC: irreg. Irreg. Soft systolic murmur  RESPIRATORY:  Clear to auscultation without rales, wheezing or rhonchi  ABDOMEN: Soft, non-tender, non-distended MUSCULOSKELETAL:  No edema; No deformity  SKIN: Warm and dry NEUROLOGIC:  Alert and oriented x 3    EKG:     Sept. 11, 2023   Atrial fib . No ST or T wave chan .   Recent Labs: No results found for requested labs within last 365 days.    Lipid Panel    Component Value Date/Time   CHOL 187 01/01/2019 0615   TRIG 55 01/01/2019 0615   HDL 57 01/01/2019 0615   CHOLHDL 3.3 01/01/2019 0615   VLDL 11 01/01/2019 0615   LDLCALC 119 (H) 01/01/2019 0615   LDLDIRECT 137.0 01/06/2012 1211      Wt Readings from Last 3 Encounters:  01/27/22 154 lb 12.8 oz (70.2 kg)  01/23/21 157 lb  (71.2 kg)  01/10/20 158 lb (71.7 kg)        ASSESSMENT AND PLAN:  1.  Permanent atrial fibrillation-Aianna presents for further evaluation and management of her chronic atrial fibrillation.  She is on Xarelto.  Rate is well controlled.  She may need to have some dental work done.  I told her that we would be able to clear her for her procedure.  She is at low risk for her upcoming dental procedure.  We will have our pharmacy team provide guidance as to how long she should hold her Xarelto but I would suggest that it would be a day or 2 prior to her procedure.       2. essential hypertension without congestive heart failure      5.  Hx  Of stroke     Current medicines are reviewed at length with the patient today.  The patient does not have concerns regarding medicines.  The following changes have been made:  no change  Labs/ tests ordered today include:   Orders Placed This Encounter  Procedures   EKG 12-Lead             Mertie Moores, MD  01/27/2022 5:20 PM    [address],  [place], [place]  [address] Pager [phone] Phone: [phone]; Fax: [phone]

## 2022-01-27 ENCOUNTER — Other Ambulatory Visit: Payer: Self-pay | Admitting: Cardiovascular Disease

## 2022-01-27 ENCOUNTER — Encounter: Payer: Self-pay | Admitting: Cardiovascular Disease

## 2022-01-27 ENCOUNTER — Ambulatory Visit: Payer: Medicare Other | Attending: Cardiovascular Disease | Admitting: Cardiovascular Disease

## 2022-01-27 VITALS — BP 144/78 | HR 62 | Ht 63.0 in | Wt 154.8 lb

## 2022-01-27 DIAGNOSIS — I4821 Permanent atrial fibrillation: Secondary | ICD-10-CM

## 2022-01-27 DIAGNOSIS — I10 Essential (primary) hypertension: Secondary | ICD-10-CM

## 2022-01-27 NOTE — Telephone Encounter (Signed)
Prescription refill request for Xarelto received.  Indication:Afib Last office visit:9/23 Weight:71.2 kg Age:88 Scr:0.9 CrCl:36.42 ml/min  Prescription refilled

## 2022-01-27 NOTE — Patient Instructions (Signed)
Medication Instructions:  Your physician recommends that you continue on your current medications as directed. Please refer to the Current Medication list given to you today.  *If you need a refill on your cardiac medications before your next appointment, please call your pharmacy*   Follow-Up: At [facility], you and your health needs are our priority.  As part of our continuing mission to provide you with exceptional heart care, we have created designated Provider Care Teams.  These Care Teams include your primary Cardiologist (physician) and Advanced Practice Providers (APPs -  Physician Assistants and Nurse Practitioners) who all work together to provide you with the care you need, when you need it.  We recommend signing up for the patient portal called "MyChart".  Sign up information is provided on this After Visit Summary.  MyChart is used to connect with patients for Virtual Visits (Telemedicine).  Patients are able to view lab/test results, encounter notes, upcoming appointments, etc.  Non-urgent messages can be sent to your provider as well.   To learn more about what you can do with MyChart, go to [email or web address].    Your next appointment:   1 year(s)  The format for your next appointment:   In Person  Provider:   Mertie Moores, MD

## 2022-02-03 DIAGNOSIS — Z961 Presence of intraocular lens: Secondary | ICD-10-CM | POA: Diagnosis not present

## 2022-02-03 DIAGNOSIS — H52203 Unspecified astigmatism, bilateral: Secondary | ICD-10-CM | POA: Diagnosis not present

## 2022-02-03 DIAGNOSIS — E113292 Type 2 diabetes mellitus with mild nonproliferative diabetic retinopathy without macular edema, left eye: Secondary | ICD-10-CM | POA: Diagnosis not present

## 2022-02-26 DIAGNOSIS — Z23 Encounter for immunization: Secondary | ICD-10-CM | POA: Diagnosis not present

## 2022-02-26 DIAGNOSIS — G2581 Restless legs syndrome: Secondary | ICD-10-CM | POA: Diagnosis not present

## 2022-03-17 ENCOUNTER — Encounter (HOSPITAL_BASED_OUTPATIENT_CLINIC_OR_DEPARTMENT_OTHER): Payer: Self-pay | Admitting: *Deleted

## 2022-03-17 ENCOUNTER — Emergency Department (HOSPITAL_BASED_OUTPATIENT_CLINIC_OR_DEPARTMENT_OTHER): Payer: Medicare Other

## 2022-03-17 ENCOUNTER — Other Ambulatory Visit: Payer: Self-pay

## 2022-03-17 ENCOUNTER — Encounter (HOSPITAL_COMMUNITY): Payer: Self-pay

## 2022-03-17 ENCOUNTER — Inpatient Hospital Stay (HOSPITAL_BASED_OUTPATIENT_CLINIC_OR_DEPARTMENT_OTHER)
Admission: EM | Admit: 2022-03-17 | Discharge: 2022-03-20 | DRG: 291 | Disposition: A | Discharge status: Home Health | Payer: Medicare Other | Attending: Internal Medicine | Admitting: Internal Medicine

## 2022-03-17 DIAGNOSIS — D509 Iron deficiency anemia, unspecified: Secondary | ICD-10-CM | POA: Diagnosis present

## 2022-03-17 DIAGNOSIS — Z882 Allergy status to sulfonamides status: Secondary | ICD-10-CM | POA: Diagnosis not present

## 2022-03-17 DIAGNOSIS — J439 Emphysema, unspecified: Secondary | ICD-10-CM | POA: Diagnosis not present

## 2022-03-17 DIAGNOSIS — D649 Anemia, unspecified: Secondary | ICD-10-CM | POA: Diagnosis not present

## 2022-03-17 DIAGNOSIS — M7989 Other specified soft tissue disorders: Secondary | ICD-10-CM | POA: Diagnosis not present

## 2022-03-17 DIAGNOSIS — Z885 Allergy status to narcotic agent status: Secondary | ICD-10-CM

## 2022-03-17 DIAGNOSIS — R300 Dysuria: Secondary | ICD-10-CM | POA: Diagnosis not present

## 2022-03-17 DIAGNOSIS — J810 Acute pulmonary edema: Secondary | ICD-10-CM | POA: Diagnosis not present

## 2022-03-17 DIAGNOSIS — I5033 Acute on chronic diastolic (congestive) heart failure: Secondary | ICD-10-CM | POA: Diagnosis not present

## 2022-03-17 DIAGNOSIS — M47814 Spondylosis without myelopathy or radiculopathy, thoracic region: Secondary | ICD-10-CM | POA: Diagnosis present

## 2022-03-17 DIAGNOSIS — R600 Localized edema: Secondary | ICD-10-CM | POA: Diagnosis not present

## 2022-03-17 DIAGNOSIS — J9601 Acute respiratory failure with hypoxia: Secondary | ICD-10-CM | POA: Diagnosis not present

## 2022-03-17 DIAGNOSIS — Z91013 Allergy to seafood: Secondary | ICD-10-CM

## 2022-03-17 DIAGNOSIS — R079 Chest pain, unspecified: Secondary | ICD-10-CM | POA: Diagnosis not present

## 2022-03-17 DIAGNOSIS — D3502 Benign neoplasm of left adrenal gland: Secondary | ICD-10-CM | POA: Diagnosis present

## 2022-03-17 DIAGNOSIS — I700 Atherosclerosis of aorta: Secondary | ICD-10-CM | POA: Diagnosis present

## 2022-03-17 DIAGNOSIS — E119 Type 2 diabetes mellitus without complications: Secondary | ICD-10-CM

## 2022-03-17 DIAGNOSIS — Z8249 Family history of ischemic heart disease and other diseases of the circulatory system: Secondary | ICD-10-CM

## 2022-03-17 DIAGNOSIS — I509 Heart failure, unspecified: Secondary | ICD-10-CM | POA: Diagnosis not present

## 2022-03-17 DIAGNOSIS — R350 Frequency of micturition: Secondary | ICD-10-CM | POA: Diagnosis present

## 2022-03-17 DIAGNOSIS — I10 Essential (primary) hypertension: Secondary | ICD-10-CM | POA: Diagnosis not present

## 2022-03-17 DIAGNOSIS — I4891 Unspecified atrial fibrillation: Secondary | ICD-10-CM | POA: Diagnosis present

## 2022-03-17 DIAGNOSIS — R001 Bradycardia, unspecified: Secondary | ICD-10-CM | POA: Diagnosis not present

## 2022-03-17 DIAGNOSIS — Z79899 Other long term (current) drug therapy: Secondary | ICD-10-CM | POA: Diagnosis not present

## 2022-03-17 DIAGNOSIS — Z7901 Long term (current) use of anticoagulants: Secondary | ICD-10-CM

## 2022-03-17 DIAGNOSIS — Z20822 Contact with and (suspected) exposure to covid-19: Secondary | ICD-10-CM | POA: Diagnosis not present

## 2022-03-17 DIAGNOSIS — J811 Chronic pulmonary edema: Secondary | ICD-10-CM | POA: Diagnosis not present

## 2022-03-17 DIAGNOSIS — G2581 Restless legs syndrome: Secondary | ICD-10-CM | POA: Diagnosis present

## 2022-03-17 DIAGNOSIS — I4821 Permanent atrial fibrillation: Secondary | ICD-10-CM | POA: Diagnosis present

## 2022-03-17 DIAGNOSIS — Z7984 Long term (current) use of oral hypoglycemic drugs: Secondary | ICD-10-CM | POA: Diagnosis not present

## 2022-03-17 DIAGNOSIS — E1122 Type 2 diabetes mellitus with diabetic chronic kidney disease: Secondary | ICD-10-CM | POA: Diagnosis present

## 2022-03-17 DIAGNOSIS — N1832 Chronic kidney disease, stage 3b: Secondary | ICD-10-CM | POA: Diagnosis present

## 2022-03-17 DIAGNOSIS — Z8673 Personal history of transient ischemic attack (TIA), and cerebral infarction without residual deficits: Secondary | ICD-10-CM | POA: Diagnosis not present

## 2022-03-17 DIAGNOSIS — E039 Hypothyroidism, unspecified: Secondary | ICD-10-CM | POA: Diagnosis present

## 2022-03-17 DIAGNOSIS — I130 Hypertensive heart and chronic kidney disease with heart failure and stage 1 through stage 4 chronic kidney disease, or unspecified chronic kidney disease: Principal | ICD-10-CM | POA: Diagnosis present

## 2022-03-17 DIAGNOSIS — I482 Chronic atrial fibrillation, unspecified: Secondary | ICD-10-CM | POA: Diagnosis present

## 2022-03-17 DIAGNOSIS — J80 Acute respiratory distress syndrome: Secondary | ICD-10-CM | POA: Diagnosis not present

## 2022-03-17 DIAGNOSIS — Z91048 Other nonmedicinal substance allergy status: Secondary | ICD-10-CM | POA: Diagnosis not present

## 2022-03-17 DIAGNOSIS — R0602 Shortness of breath: Secondary | ICD-10-CM | POA: Diagnosis not present

## 2022-03-17 DIAGNOSIS — J90 Pleural effusion, not elsewhere classified: Secondary | ICD-10-CM | POA: Diagnosis not present

## 2022-03-17 DIAGNOSIS — J9811 Atelectasis: Secondary | ICD-10-CM | POA: Diagnosis not present

## 2022-03-17 DIAGNOSIS — I491 Atrial premature depolarization: Secondary | ICD-10-CM | POA: Diagnosis not present

## 2022-03-17 DIAGNOSIS — E1121 Type 2 diabetes mellitus with diabetic nephropathy: Secondary | ICD-10-CM | POA: Diagnosis not present

## 2022-03-17 LAB — URINALYSIS, ROUTINE W REFLEX MICROSCOPIC
Bilirubin Urine: NEGATIVE
Glucose, UA: NEGATIVE mg/dL
Hgb urine dipstick: NEGATIVE
Ketones, ur: NEGATIVE mg/dL
Leukocytes,Ua: NEGATIVE
Nitrite: NEGATIVE
Protein, ur: NEGATIVE mg/dL
Specific Gravity, Urine: 1.008 (ref 1.005–1.030)
pH: 7 (ref 5.0–8.0)

## 2022-03-17 LAB — LACTIC ACID, PLASMA
Lactic Acid, Venous: 0.9 mmol/L (ref 0.5–1.9)
Lactic Acid, Venous: 0.8 mmol/L (ref 0.5–1.9)

## 2022-03-17 LAB — COMPREHENSIVE METABOLIC PANEL
ALT: 9 U/L (ref 0–44)
AST: 15 U/L (ref 15–41)
Albumin: 3.8 g/dL (ref 3.5–5.0)
Alkaline Phosphatase: 73 U/L (ref 38–126)
Anion gap: 8 (ref 5–15)
BUN: 17 mg/dL (ref 8–23)
CO2: 23 mmol/L (ref 22–32)
Calcium: 9.1 mg/dL (ref 8.9–10.3)
Chloride: 100 mmol/L (ref 98–111)
Creatinine, Ser: 0.9 mg/dL (ref 0.44–1.00)
GFR, Estimated: 57 mL/min — ABNORMAL LOW (ref >60)
Glucose, Bld: 110 mg/dL — ABNORMAL HIGH (ref 70–99)
Potassium: 4.5 mmol/L (ref 3.5–5.1)
Sodium: 131 mmol/L — ABNORMAL LOW (ref 135–145)
Total Bilirubin: 0.6 mg/dL (ref 0.3–1.2)
Total Protein: 6.6 g/dL (ref 6.5–8.1)

## 2022-03-17 LAB — CBC WITH DIFFERENTIAL/PLATELET
Abs Immature Granulocytes: 0.01 10*3/uL (ref 0.00–0.07)
Basophils Absolute: 0.1 10*3/uL (ref 0.0–0.1)
Basophils Relative: 1 %
Eosinophils Absolute: 0.1 10*3/uL (ref 0.0–0.5)
Eosinophils Relative: 2 %
HCT: 22.8 % — ABNORMAL LOW (ref 36.0–46.0)
Hemoglobin: 7 g/dL — ABNORMAL LOW (ref 12.0–15.0)
Immature Granulocytes: 0 %
Lymphocytes Relative: 28 %
Lymphs Abs: 1.7 10*3/uL (ref 0.7–4.0)
MCH: 28 pg (ref 26.0–34.0)
MCHC: 30.7 g/dL (ref 30.0–36.0)
MCV: 91.2 fL (ref 80.0–100.0)
Monocytes Absolute: 0.7 10*3/uL (ref 0.1–1.0)
Monocytes Relative: 12 %
Neutro Abs: 3.5 10*3/uL (ref 1.7–7.7)
Neutrophils Relative %: 57 %
Platelets: 250 10*3/uL (ref 150–400)
RBC: 2.5 MIL/uL — ABNORMAL LOW (ref 3.87–5.11)
RDW: 15.5 % (ref 11.5–15.5)
WBC: 6 10*3/uL (ref 4.0–10.5)
nRBC: 0 % (ref 0.0–0.2)

## 2022-03-17 LAB — GLUCOSE, CAPILLARY: Glucose-Capillary: 87 mg/dL (ref 70–99)

## 2022-03-17 LAB — BRAIN NATRIURETIC PEPTIDE: B Natriuretic Peptide: 273.4 pg/mL — ABNORMAL HIGH (ref 0.0–100.0)

## 2022-03-17 LAB — RESP PANEL BY RT-PCR (FLU A&B, COVID) ARPGX2
Influenza A by PCR: NEGATIVE
Influenza B by PCR: NEGATIVE
SARS Coronavirus 2 by RT PCR: NEGATIVE

## 2022-03-17 LAB — PROTIME-INR
INR: 1.5 — ABNORMAL HIGH (ref 0.8–1.2)
Prothrombin Time: 17.9 seconds — ABNORMAL HIGH (ref 11.4–15.2)

## 2022-03-17 LAB — APTT: aPTT: 40 seconds — ABNORMAL HIGH (ref 24–36)

## 2022-03-17 LAB — TROPONIN I (HIGH SENSITIVITY)
Troponin I (High Sensitivity): 33 ng/L — ABNORMAL HIGH (ref <18)
Troponin I (High Sensitivity): 33 ng/L — ABNORMAL HIGH (ref <18)

## 2022-03-17 LAB — OCCULT BLOOD X 1 CARD TO LAB, STOOL: Fecal Occult Bld: NEGATIVE

## 2022-03-17 LAB — CBG MONITORING, ED: Glucose-Capillary: 128 mg/dL — ABNORMAL HIGH (ref 70–99)

## 2022-03-17 MED ORDER — FUROSEMIDE 10 MG/ML IJ SOLN
20.0000 mg | Freq: Once | INTRAMUSCULAR | Status: AC
  Administered 2022-03-17: 20 mg via INTRAVENOUS
  Filled 2022-03-17: qty 2

## 2022-03-17 MED ORDER — RIVAROXABAN 15 MG PO TABS
15.0000 mg | ORAL_TABLET | Freq: Every day | ORAL | Status: DC
  Administered 2022-03-18 – 2022-03-19 (×2): 15 mg via ORAL
  Filled 2022-03-17 (×2): qty 1

## 2022-03-17 MED ORDER — INSULIN ASPART 100 UNIT/ML IJ SOLN: 0.0000 [IU] | Freq: Every day | INTRAMUSCULAR | Status: DC

## 2022-03-17 MED ORDER — MECLIZINE HCL 25 MG PO TABS: 12.5000 mg | ORAL_TABLET | Freq: Three times a day (TID) | ORAL | Status: DC | PRN

## 2022-03-17 MED ORDER — PROSIGHT PO TABS
1.0000 | ORAL_TABLET | Freq: Two times a day (BID) | ORAL | Status: DC
  Administered 2022-03-18 – 2022-03-20 (×6): 1 via ORAL
  Filled 2022-03-17 (×6): qty 1

## 2022-03-17 MED ORDER — INSULIN ASPART 100 UNIT/ML IJ SOLN
0.0000 [IU] | Freq: Three times a day (TID) | INTRAMUSCULAR | Status: DC
  Administered 2022-03-20: 2 [IU] via SUBCUTANEOUS

## 2022-03-17 MED ORDER — OMEGA-3-ACID ETHYL ESTERS 1 G PO CAPS
1.0000 g | ORAL_CAPSULE | Freq: Every day | ORAL | Status: DC
  Administered 2022-03-18 – 2022-03-20 (×3): 1 g via ORAL
  Filled 2022-03-17 (×3): qty 1

## 2022-03-17 MED ORDER — LISINOPRIL 20 MG PO TABS
20.0000 mg | ORAL_TABLET | Freq: Every day | ORAL | Status: DC
  Administered 2022-03-18 – 2022-03-20 (×4): 20 mg via ORAL
  Filled 2022-03-17 (×4): qty 1

## 2022-03-17 MED ORDER — SODIUM CHLORIDE 0.9% IV SOLUTION: Freq: Once | INTRAVENOUS | Status: DC

## 2022-03-17 MED ORDER — LEVOTHYROXINE SODIUM 88 MCG PO TABS
88.0000 ug | ORAL_TABLET | Freq: Every day | ORAL | Status: DC
  Administered 2022-03-18 – 2022-03-20 (×3): 88 ug via ORAL
  Filled 2022-03-17 (×3): qty 1

## 2022-03-17 MED ORDER — VITAMIN B-12 100 MCG PO TABS
100.0000 ug | ORAL_TABLET | Freq: Every day | ORAL | Status: DC
  Administered 2022-03-18 – 2022-03-20 (×3): 100 ug via ORAL
  Filled 2022-03-17 (×3): qty 1

## 2022-03-17 MED ORDER — ENOXAPARIN SODIUM 40 MG/0.4ML IJ SOSY: 40.0000 mg | PREFILLED_SYRINGE | INTRAMUSCULAR | Status: DC

## 2022-03-17 MED ORDER — IOHEXOL 350 MG/ML SOLN
100.0000 mL | Freq: Once | INTRAVENOUS | Status: AC | PRN
  Administered 2022-03-17: 75 mL via INTRAVENOUS

## 2022-03-17 MED ORDER — RISAQUAD PO CAPS
1.0000 | ORAL_CAPSULE | Freq: Every day | ORAL | Status: DC | PRN
  Administered 2022-03-18: 1 via ORAL
  Filled 2022-03-17: qty 1

## 2022-03-17 NOTE — H&P (Incomplete)
History and Physical  Amanda [name] GYK:[id number] DOB: 09-06-1918 DOA: 03/17/2022  Referring physician: Accepted by Dr. Marlowe Sax, [facility].  PCP: Mayra Neer, MD  Outpatient Specialists: Dr. Acie Fredrickson, cardiology. Patient coming from: Home through [facility] ED  Chief Complaint: Shortness of breath.  HPI: Amanda [name] is a 88 y.o. female with medical history significant for chronic atrial fibrillation on Xarelto, CVA, type 2 diabetes, essential hypertension, who presented to [place] ED from home where she lives alone, accompanied by her son and daughter, with complaints of gradually worsening exertional dyspnea for the past 1 week and urinary symptoms for the past few days.  No reported subjective fevers or chills.  In the ED, work-up revealed pulmonary edema, bilateral pleural effusions, lower extremity edema, bilateral JVD, with concern for acute CHF.  CT angio chest was negative for PE.  Lower extremity Doppler ultrasound was negative for DVT.    Work-up also revealed new anemia with hemoglobin of 7.0 from baseline of 13, 2 years ago.  UA was negative for pyuria.  The patient received IV Lasix 20 mg x 1 in the ED.  TR[facility], hospitalist service, was asked to admit.  The patient was admitted by Dr. Marlowe Sax, and transferred to [facility] long hospital telemetry unit as observation status.  Upon arrival, 1 unit PRBCs was ordered to be transfused.  Iron studies show severe iron deficiency.  1 dose Feraheme ordered to be administered 510 mg x1.  ED Course: Tmax 98.3.  BP 156/64, pulse 62, respiratory rate 20, O2 saturation 100% on 2 L.  Lab studies remarkable for hemoglobin 7.0 iron level 28, trans sat 5,  Review of Systems: Review of systems as noted in the HPI. All other systems reviewed and are negative.   Past Medical History:  Diagnosis Date   Carpal tunnel syndrome    Chronic anticoagulation    Chronic atrial fibrillation (HCC)    Diabetes mellitus    NON-INSULIN    Hypothyroidism    Sinus congestion    Past Surgical History:  Procedure Laterality Date   APPENDECTOMY     CARDIOVASCULAR STRESS TEST  07/10/2004   EF 81%   CHOLECYSTECTOMY     US ECHOCARDIOGRAPHY  12/06/2007   EF 55-60%    Social History:  reports that she has never smoked. She has never used smokeless tobacco. She reports that she does not drink alcohol and does not use drugs.   Allergies  Allergen Reactions   Other Other (See Comments)    Calcium w/D causes constipation   Tape Other (See Comments)    SKIN IS VERY THIN AND WILL TEAR AND BRUISE EASILY!! PLEASE USE COBAN WRAP   Codeine Nausea Only   Hydrocodone Nausea Only   Metoprolol Nausea Only   Rythmol [Propafenone Hcl] Other (See Comments)    Dizziness    Shellfish Allergy Itching and Rash    MUSSELS (only)   Sulfa Antibiotics Nausea Only    Family History  Problem Relation Age of Onset   Stroke Mother    Hypertension Mother    Diabetes Sister    Hypertension Other    Heart attack Neg Hx       Prior to Admission medications   Medication Sig Start Date End Date Taking? Authorizing Provider  acetaminophen (TYLENOL) 500 MG tablet Take 1,000 mg by mouth every 6 (six) hours as needed (for pain).    [provider]  amLODipine (NORVASC) 5 MG tablet Take 1 tablet (5 mg total) by mouth daily. 05/16/20 06/15/20  Curatolo, Adam, DO  Arnica 20 % TINC Apply 1 application topically daily as needed (for pain).     [provider]  Blood Glucose Monitoring Suppl (ONE TOUCH ULTRA 2) w/Device KIT 1 Device by Other route daily. 08/02/19   [provider]  glimepiride (AMARYL) 1 MG tablet Take 1.5 mg by mouth daily with breakfast.     [provider]  Lancets (ONETOUCH ULTRASOFT) lancets 1 each by Other route daily. 10/04/19   [provider]  levothyroxine (SYNTHROID, LEVOTHROID) 88 MCG tablet Take 88 mcg by mouth daily.      [provider]  lisinopril (ZESTRIL) 20 MG tablet  Take 20 mg by mouth daily. 12/16/20   [provider]  Magnesium 300 MG CAPS Take 1 capsule by mouth daily.    [provider]  meclizine (ANTIVERT) 25 MG tablet Take one every 6-8 hours as needed for dizziness 09/04/15   Milton Ferguson, MD  Multiple Vitamins-Minerals (PRESERVISION AREDS 2 PO) Take 1 capsule by mouth 2 (two) times daily.     [provider]  Niacin (VITAMIN B-3 PO) Take 1 tablet by mouth daily.    [provider]  Omega-3 Fatty Acids (FISH OIL) 1000 MG CAPS Take 1 capsule by mouth every morning.    [provider]  ondansetron (ZOFRAN-ODT) 4 MG disintegrating tablet Take 4 mg by mouth every 4 (four) hours as needed for nausea or vomiting.    [provider]  [facility] ULTRA test strip 1 each by Other route daily. 10/04/19   [provider]  OVER THE COUNTER MEDICATION Take 1 tablet by mouth as needed. Allerclear allergy tab    [provider]  Probiotic Product (PROBIOTIC DAILY PO) Take 2 tablets by mouth daily as needed (DIGESTION).     [provider]  sodium chloride (OCEAN) 0.65 % nasal spray Place 1 spray into the nose daily as needed.    [provider]  vitamin B-12 (CYANOCOBALAMIN) 100 MCG tablet Take 100 mcg by mouth daily.    [provider]  Wheat Dextrin (BENEFIBER DRINK MIX PO) Take by mouth daily.    [provider]  XARELTO 15 MG TABS tablet TAKE 1 TABLET(15 MG) BY MOUTH DAILY WITH SUPPER 01/27/22   Nahser, Wonda Cheng, MD    Physical Exam: BP (!) 154/84 (BP Location: Left Arm)   Pulse 83   Temp 98.3 F (36.8 C) (Oral)   Resp 20   Ht _0  (1.6 m)   Wt 70.2 kg   SpO2 100%   BMI 27.42 kg/m   General: 88 y.o. year-old female well developed well nourished in no acute distress.  Alert and interactive.  Mild to moderate hearing loss. Cardiovascular: Regular rate and rhythm with no rubs or gallops.  No thyromegaly or JVD noted.  1+ pitting edema in lower  extremities bilaterally.   Respiratory: Mild rales at bases.  Poor inspiratory effort. Abdomen: Soft nontender nondistended with normal bowel sounds x4 quadrants. Muskuloskeletal: No cyanosis or clubbing.  1+ pitting edema in lower extremities bilaterally. Neuro: CN II-XII intact, strength, sensation, reflexes Skin: No ulcerative lesions noted or rashes Psychiatry: Judgement and insight appear normal. Mood is appropriate for condition and setting          Labs on Admission:  Basic Metabolic Panel: Recent Labs  Lab 03/17/22 1648  NA 131*  K 4.5  CL 100  CO2 23  GLUCOSE 110*  BUN 17  CREATININE 0.90  CALCIUM 9.1  Liver Function Tests: Recent Labs  Lab 03/17/22 1648  AST 15  ALT 9  ALKPHOS 73  BILITOT 0.6  PROT 6.6  ALBUMIN 3.8   No results for input(s): "LIPASE", "AMYLASE" in the last 168 hours. No results for input(s): "AMMONIA" in the last 168 hours. CBC: Recent Labs  Lab 03/17/22 1648  WBC 6.0  NEUTROABS 3.5  HGB 7.0*  HCT 22.8*  MCV 91.2  PLT 250   Cardiac Enzymes: No results for input(s): "CKTOTAL", "CKMB", "CKMBINDEX", "TROPONINI" in the last 168 hours.  BNP (last 3 results) Recent Labs    03/17/22 1248  BNP 273.4*    ProBNP (last 3 results) No results for input(s): "PROBNP" in the last 8760 hours.  CBG: Recent Labs  Lab 03/17/22 1606 03/17/22 2306  GLUCAP 128* 87    Radiological Exams on Admission: CT Angio Chest PE W and/or Wo Contrast  Result Date: 03/17/2022 CLINICAL DATA:  Chest pain.  Dysuria. EXAM: CT ANGIOGRAPHY CHEST WITH CONTRAST TECHNIQUE: Multidetector CT imaging of the chest was performed using the standard protocol during bolus administration of intravenous contrast. Multiplanar CT image reconstructions and MIPs were obtained to evaluate the vascular anatomy. RADIATION DOSE REDUCTION: This exam was performed according to the departmental dose-optimization program which includes automated exposure control, adjustment of the mA  and/or kV according to patient size and/or use of iterative reconstruction technique. CONTRAST:  77m OMNIPAQUE IOHEXOL 350 MG/ML SOLN COMPARISON:  Chest radiograph 09/11/2016 FINDINGS: Cardiovascular: No filling defect is identified in the pulmonary arterial tree to suggest pulmonary embolus. Coronary, aortic arch, and branch vessel atherosclerotic vascular disease. Moderate cardiomegaly. Mitral valve calcification. Mediastinum/Nodes: Unremarkable Lungs/Pleura: Moderate right and small left pleural effusion with associated passive atelectasis. Mild secondary pulmonary lobular septal thickening at the lung apices with geographic ground-glass densities in both lungs. Mild airway thickening and notable airway calcifications with airway narrowing and plugging in both lower lobes. Upper Abdomen: Abdominal aortic and systemic atherosclerosis. 2.2 by 1.4 cm left adrenal mass, internal density 5 Hounsfield units compatible with adrenal adenoma. No further imaging workup of this lesion is indicated. Musculoskeletal: Thoracic spondylosis. Review of the MIP images confirms the above findings. IMPRESSION: 1. No filling defect is identified in the pulmonary arterial tree to suggest pulmonary embolus. 2. Moderate right and small left pleural effusions with associated passive atelectasis. 3. Mild secondary pulmonary lobular septal thickening with geographic ground-glass densities in both lungs, favoring mild pulmonary edema. 4. Airway thickening and airway calcifications with airway narrowing and plugging in both lower lobes. 5. Moderate cardiomegaly. Mitral valve calcification. 6. 2.2 by 1.4 cm left adrenal adenoma. 7. Thoracic spondylosis. 8. Aortic atherosclerosis. Aortic Atherosclerosis (ICD10-I70.0). Electronically Signed   By: WVan ClinesM.D.   On: 03/17/2022 18:52   UK[place]Venous Img Lower Bilateral  Result Date: 03/17/2022 CLINICAL DATA:  Lower extremity swelling EXAM: BILATERAL LOWER EXTREMITY VENOUS DOPPLER  ULTRASOUND TECHNIQUE: Gray-scale sonography with graded compression, as well as color Doppler and duplex ultrasound were performed to evaluate the lower extremity deep venous systems from the level of the common femoral vein and including the common femoral, femoral, profunda femoral, popliteal and calf veins including the posterior tibial, peroneal and gastrocnemius veins when visible. The superficial great saphenous vein was also interrogated. Spectral Doppler was utilized to evaluate flow at rest and with distal augmentation maneuvers in the common femoral, femoral and popliteal veins. COMPARISON:  Chest XR, 09/11/2016. FINDINGS: RIGHT LOWER EXTREMITY VENOUS Normal compressibility of the RIGHT common femoral, superficial femoral, and popliteal veins,  as well as the visualized calf veins. Visualized portions of profunda femoral vein and great saphenous vein unremarkable. No filling defects to suggest DVT on grayscale or color Doppler imaging. Doppler waveforms show normal direction of venous flow, normal respiratory plasticity and response to augmentation. OTHER No evidence of superficial thrombophlebitis or abnormal fluid collection. Limitations: none LEFT LOWER EXTREMITY VENOUS Normal compressibility of the LEFT common femoral, superficial femoral, and popliteal veins, as well as the visualized calf veins. Visualized portions of profunda femoral vein and great saphenous vein unremarkable. No filling defects to suggest DVT on grayscale or color Doppler imaging. Doppler waveforms show normal direction of venous flow, normal respiratory plasticity and response to augmentation. OTHER No evidence of superficial thrombophlebitis or abnormal fluid collection. Limitations: none IMPRESSION: No evidence of femoropopliteal DVT or superficial thrombophlebitis within either lower extremity. Michaelle Birks, MD [facility] Electronically Signed   By: Michaelle Birks M.D.    On: 03/17/2022 17:12    EKG: I independently viewed the EKG done and my findings are as followed: Atrial fibrillation rate of 62.  Nonspecific ST-T changes.  QTc 444.  Assessment/Plan Present on Admission:  Symptomatic anemia  Principal Problem:   Symptomatic anemia  Symptomatic severe iron deficiency anemia Presented with hemoglobin of 7.0, progressive dyspnea on exertion. Iron studies revealed severe iron deficiency 1 unit PRBC ordered to be transfused, with patient's consent, son and daughter in the room. Feraheme 1 dose 510 mg x 1 ordered to be administered. No overt bleeding, FOBT negative. No abdominal pain, has not seen GI in decades.  Acute blood loss anemia, unclear etiology Hemoglobin 7.0 from baseline of 13, 2 years ago 1 unit PRBC ordered to be transfused Repeat H&H  Pulmonary edema/bilateral pleural effusions/bilateral JVD/bilateral lower extremity pitting edema, with concern for acute CHF Follow 2D echo IV diuresing, IV Lasix 20 mg daily x3 doses Start strict I's and O's and daily weight  Permanent A-fib, rate is controlled Resume home Xarelto for CVA prevention Monitor on telemetry  Type 2 diabetes Obtain hemoglobin A1c Start insulin sliding scale.  Essential hypertension Resume home lisinopril 20 mg daily. Monitor vital signs.  Restless leg syndrome Recently started on ropinirole, resume  Hypothyroidism Resume home levothyroxine  Generalized weakness PT OT assessment Fall precautions.   DVT prophylaxis: Home Xarelto  Code Status: Full code.  Family Communication: Son and daughter at bedside, they were updated.  Disposition Plan: Admitted to telemetry unit  Consults called: None.  Dr. Acie Fredrickson, her cardiologist, was added to the treatment team.  Admission status: Observation status.   Status is: Observation    Kayleen Memos MD [facility] Pager [phone]  If 7PM-7AM, please contact night-coverage [email or web address] Password  [email or web address]  03/18/2022, 12:56 AM

## 2022-03-17 NOTE — ED Notes (Signed)
At bedside as a chaperone for exam.

## 2022-03-17 NOTE — ED Notes (Signed)
Blood Cultures x 2 obtained BC #1 Rt AC BC #2 Lt AC

## 2022-03-17 NOTE — ED Provider Notes (Signed)
[facility] EMERGENCY DEPT Provider Note   CSN: [id number] Arrival date & time: 03/17/22  1452     History  Chief Complaint  Patient presents with   Urinary Frequency    Amanda [name] is a 88 y.o. female.   Urinary Frequency Associated symptoms include shortness of breath.     88 year old female with medical history significant for chronic atrial fibrillation, history of CVA, DM 2, on Xarelto for anticoagulation chronically who presents to the emergency department with shortness of breath and some urinary frequency.  The patient's shortness of breath has been worsening over the last several weeks to months.  No recent history of dark tarry stools or hematochezia.  No hematemesis.  The patient and daughter state that her shortness of breath and dyspnea has been more pronounced over the last few days which prompted her presentation to the emergency department.  Additionally, she has been having some pain and burning with urination that she has noticed over the past few days as well.  No fevers or chills.  No nausea, vomiting, diarrhea.  No abdominal discomfort.  She arrives alert and oriented x3, GCS 15.  No recent falls or trauma.  Home Medications Prior to Admission medications   Medication Sig Start Date End Date Taking? Authorizing Provider  acetaminophen (TYLENOL) 500 MG tablet Take 1,000 mg by mouth every 6 (six) hours as needed (for pain).    [provider]  amLODipine (NORVASC) 5 MG tablet Take 1 tablet (5 mg total) by mouth daily. 05/16/20 06/15/20  Curatolo, Adam, DO  Arnica 20 % TINC Apply 1 application topically daily as needed (for pain).     [provider]  Blood Glucose Monitoring Suppl (ONE TOUCH ULTRA 2) w/Device KIT 1 Device by Other route daily. 08/02/19   [provider]  glimepiride (AMARYL) 1 MG tablet Take 1.5 mg by mouth daily with breakfast.     [provider]  Lancets (ONETOUCH ULTRASOFT) lancets 1 each  by Other route daily. 10/04/19   [provider]  levothyroxine (SYNTHROID, LEVOTHROID) 88 MCG tablet Take 88 mcg by mouth daily.      [provider]  lisinopril (ZESTRIL) 20 MG tablet Take 20 mg by mouth daily. 12/16/20   [provider]  Magnesium 300 MG CAPS Take 1 capsule by mouth daily.    [provider]  meclizine (ANTIVERT) 25 MG tablet Take one every 6-8 hours as needed for dizziness 09/04/15   Milton Ferguson, MD  Multiple Vitamins-Minerals (PRESERVISION AREDS 2 PO) Take 1 capsule by mouth 2 (two) times daily.     [provider]  Niacin (VITAMIN B-3 PO) Take 1 tablet by mouth daily.    [provider]  Omega-3 Fatty Acids (FISH OIL) 1000 MG CAPS Take 1 capsule by mouth every morning.    [provider]  ondansetron (ZOFRAN-ODT) 4 MG disintegrating tablet Take 4 mg by mouth every 4 (four) hours as needed for nausea or vomiting.    [provider]  [facility] ULTRA test strip 1 each by Other route daily. 10/04/19   [provider]  OVER THE COUNTER MEDICATION Take 1 tablet by mouth as needed. Allerclear allergy tab    [provider]  Probiotic Product (PROBIOTIC DAILY PO) Take 2 tablets by mouth daily as needed (DIGESTION).     [provider]  sodium chloride (OCEAN) 0.65 % nasal spray Place 1 spray into the nose daily as needed.    [provider]  vitamin B-12 (CYANOCOBALAMIN) 100 MCG tablet Take 100 mcg by mouth daily.    [provider]  Wheat Dextrin (BENEFIBER DRINK MIX PO) Take by mouth daily.    [provider]  XARELTO 15 MG TABS tablet TAKE 1 TABLET(15 MG) BY MOUTH DAILY WITH SUPPER 01/27/22   Nahser, Wonda Cheng, MD      Allergies    Other, Tape, Codeine, Hydrocodone, Metoprolol, Rythmol [propafenone hcl], Shellfish allergy, and Sulfa antibiotics    Review of Systems   Review of Systems  Respiratory:  Positive for shortness of breath.   Genitourinary:   Positive for frequency.    Physical Exam Updated Vital Signs BP (!) 168/87   Pulse 62   Temp (!) 97.4 F (36.3 C) (Oral)   Resp 17   SpO2 (!) 82%  Physical Exam Vitals and nursing note reviewed. Exam conducted with a chaperone present.  Constitutional:      General: She is not in acute distress.    Appearance: She is well-developed.  HENT:     Head: Normocephalic and atraumatic.  Eyes:     Conjunctiva/sclera: Conjunctivae normal.  Cardiovascular:     Rate and Rhythm: Normal rate and regular rhythm.  Pulmonary:     Effort: Pulmonary effort is normal. No respiratory distress.     Breath sounds: Normal breath sounds.  Abdominal:     Palpations: Abdomen is soft.     Tenderness: There is no abdominal tenderness.  Genitourinary:    Rectum: Guaiac result negative. External hemorrhoid present.  Musculoskeletal:        General: No swelling.     Cervical back: Neck supple.     Right lower leg: Edema present.     Left lower leg: Edema present.     Comments: 2+ pitting edema on the right leg, 1+ pitting edema on the left leg  Skin:    General: Skin is warm and dry.     Capillary Refill: Capillary refill takes less than 2 seconds.  Neurological:     Mental Status: She is alert.  Psychiatric:        Mood and Affect: Mood normal.     ED Results / Procedures / Treatments   Labs (all labs ordered are listed, but only abnormal results are displayed) Labs Reviewed  COMPREHENSIVE METABOLIC PANEL - Abnormal; Notable for the following components:      Result Value   Sodium 131 (*)    Glucose, Bld 110 (*)    GFR, Estimated 57 (*)    All other components within normal limits  CBC WITH DIFFERENTIAL/PLATELET - Abnormal; Notable for the following components:   RBC 2.50 (*)    Hemoglobin 7.0 (*)    HCT 22.8 (*)    All other components within normal limits  PROTIME-INR - Abnormal; Notable for the following components:   Prothrombin Time 17.9 (*)    INR 1.5 (*)    All other  components within normal limits  APTT - Abnormal; Notable for the following components:   aPTT 40 (*)    All other components within normal limits  BRAIN NATRIURETIC PEPTIDE - Abnormal; Notable for the following components:   B Natriuretic Peptide 273.4 (*)    All other components within normal limits  CBG MONITORING, ED - Abnormal; Notable for the following components:   Glucose-Capillary 128 (*)    All other components within normal limits  TROPONIN I (HIGH SENSITIVITY) - Abnormal; Notable for the following components:   Troponin I (High  Sensitivity) 33 (*)    All other components within normal limits  RESP PANEL BY RT-PCR (FLU A&B, COVID) ARPGX2  URINE CULTURE  CULTURE, BLOOD (ROUTINE X 2)  CULTURE, BLOOD (ROUTINE X 2)  URINALYSIS, ROUTINE W REFLEX MICROSCOPIC  LACTIC ACID, PLASMA  OCCULT BLOOD X 1 CARD TO LAB, STOOL  LACTIC ACID, PLASMA  TROPONIN I (HIGH SENSITIVITY)    EKG EKG Interpretation  Date/Time:  Monday March 17 2022 17:04:19 EDT Ventricular Rate:  62 PR Interval:    QRS Duration: 95 QT Interval:  437 QTC Calculation: 444 R Axis:   96 Text Interpretation: Atrial fibrillation Right axis deviation Low voltage, precordial leads Probable anteroseptal infarct, old Confirmed by Regan Lemming ([id number]) on 03/17/2022 6:43:54 PM  Radiology CT Angio Chest PE W and/or Wo Contrast  Result Date: 03/17/2022 CLINICAL DATA:  Chest pain.  Dysuria. EXAM: CT ANGIOGRAPHY CHEST WITH CONTRAST TECHNIQUE: Multidetector CT imaging of the chest was performed using the standard protocol during bolus administration of intravenous contrast. Multiplanar CT image reconstructions and MIPs were obtained to evaluate the vascular anatomy. RADIATION DOSE REDUCTION: This exam was performed according to the departmental dose-optimization program which includes automated exposure control, adjustment of the mA and/or kV according to patient size and/or use of iterative reconstruction technique. CONTRAST:   71m OMNIPAQUE IOHEXOL 350 MG/ML SOLN COMPARISON:  Chest radiograph 09/11/2016 FINDINGS: Cardiovascular: No filling defect is identified in the pulmonary arterial tree to suggest pulmonary embolus. Coronary, aortic arch, and branch vessel atherosclerotic vascular disease. Moderate cardiomegaly. Mitral valve calcification. Mediastinum/Nodes: Unremarkable Lungs/Pleura: Moderate right and small left pleural effusion with associated passive atelectasis. Mild secondary pulmonary lobular septal thickening at the lung apices with geographic ground-glass densities in both lungs. Mild airway thickening and notable airway calcifications with airway narrowing and plugging in both lower lobes. Upper Abdomen: Abdominal aortic and systemic atherosclerosis. 2.2 by 1.4 cm left adrenal mass, internal density 5 Hounsfield units compatible with adrenal adenoma. No further imaging workup of this lesion is indicated. Musculoskeletal: Thoracic spondylosis. Review of the MIP images confirms the above findings. IMPRESSION: 1. No filling defect is identified in the pulmonary arterial tree to suggest pulmonary embolus. 2. Moderate right and small left pleural effusions with associated passive atelectasis. 3. Mild secondary pulmonary lobular septal thickening with geographic ground-glass densities in both lungs, favoring mild pulmonary edema. 4. Airway thickening and airway calcifications with airway narrowing and plugging in both lower lobes. 5. Moderate cardiomegaly. Mitral valve calcification. 6. 2.2 by 1.4 cm left adrenal adenoma. 7. Thoracic spondylosis. 8. Aortic atherosclerosis. Aortic Atherosclerosis (ICD10-I70.0). Electronically Signed   By: WVan ClinesM.D.   On: 03/17/2022 18:52   UK[place]Venous Img Lower Bilateral  Result Date: 03/17/2022 CLINICAL DATA:  Lower extremity swelling EXAM: BILATERAL LOWER EXTREMITY VENOUS DOPPLER ULTRASOUND TECHNIQUE: Gray-scale sonography with graded compression, as well as color Doppler and  duplex ultrasound were performed to evaluate the lower extremity deep venous systems from the level of the common femoral vein and including the common femoral, femoral, profunda femoral, popliteal and calf veins including the posterior tibial, peroneal and gastrocnemius veins when visible. The superficial great saphenous vein was also interrogated. Spectral Doppler was utilized to evaluate flow at rest and with distal augmentation maneuvers in the common femoral, femoral and popliteal veins. COMPARISON:  Chest XR, 09/11/2016. FINDINGS: RIGHT LOWER EXTREMITY VENOUS Normal compressibility of the RIGHT common femoral, superficial femoral, and popliteal veins, as well as the visualized calf veins. Visualized portions of profunda femoral vein and great saphenous  vein unremarkable. No filling defects to suggest DVT on grayscale or color Doppler imaging. Doppler waveforms show normal direction of venous flow, normal respiratory plasticity and response to augmentation. OTHER No evidence of superficial thrombophlebitis or abnormal fluid collection. Limitations: none LEFT LOWER EXTREMITY VENOUS Normal compressibility of the LEFT common femoral, superficial femoral, and popliteal veins, as well as the visualized calf veins. Visualized portions of profunda femoral vein and great saphenous vein unremarkable. No filling defects to suggest DVT on grayscale or color Doppler imaging. Doppler waveforms show normal direction of venous flow, normal respiratory plasticity and response to augmentation. OTHER No evidence of superficial thrombophlebitis or abnormal fluid collection. Limitations: none IMPRESSION: No evidence of femoropopliteal DVT or superficial thrombophlebitis within either lower extremity. Michaelle Birks, MD [facility] Radiology Electronically Signed   By: Michaelle Birks M.D.   On: 03/17/2022 17:12    Procedures Procedures    Medications Ordered in ED Medications   furosemide (LASIX) injection 20 mg (has no administration in time range)  iohexol (OMNIPAQUE) 350 MG/ML injection 100 mL (75 mLs Intravenous Contrast Given 03/17/22 1826)    ED Course/ Medical Decision Making/ A&P Clinical Course as of 03/17/22 1918  Mon Mar 17, 2022  1843 Hemoglobin(!): 7.0 [JL]  1915 B Natriuretic Peptide(!): 273.4 [JL]  1915 Troponin I (High Sensitivity)(!): 33 [JL]    Clinical Course User Index [JL] Regan Lemming, MD                           Medical Decision Making Amount and/or Complexity of Data Reviewed Labs: ordered. Decision-making details documented in ED Course. Radiology: ordered. ECG/medicine tests: ordered.  Risk Prescription drug management. Decision regarding hospitalization.   88 year old female with medical history significant for chronic atrial fibrillation, history of CVA, DM 2, on Xarelto for anticoagulation chronically who presents to the emergency department with shortness of breath and some urinary frequency.  The patient's shortness of breath has been worsening over the last several weeks to months.  No recent history of dark tarry stools or hematochezia.  No hematemesis.  The patient and daughter state that her shortness of breath and dyspnea has been more pronounced over the last few days which prompted her presentation to the emergency department.  Additionally, she has been having some pain and burning with urination that she has noticed over the past few days as well.  No fevers or chills.  No nausea, vomiting, diarrhea.  No abdominal discomfort.  She arrives alert and oriented x3, GCS 15.  No recent falls or trauma.  Family does state that the patient has had increasing swelling in the lower extremities over the last few days, right greater than left.  On arrival, the patient was afebrile, temperature 97.4, not tachycardic or tachypneic, BP 172/87, saturating 94% on room air.  Differential diagnosis includes pneumonia, viral URI, CHF,  anemia, electrolyte abnormality, atrial fibrillation with RVR, other cardiac arrhythmia, malignancy, PE, DVT.   EKG was performed which revealed atrial fibrillation, ventricular rate 62 rate controlled, no acute ST segment changes to indicate ischemia.  The patient denies any chest pain.  Laboratory evaluation significant for CMP with mild hyponatremia 131, otherwise unremarkable, CBC without a leukocytosis, new anemia with a hemoglobin of 7.0 from a baseline of normal.  Fecal occult testing was collected and resulted negative with no melena or hematochezia seen on exam.  Patient's initial troponin was mildly elevated at 33, her INR was mildly elevated  at 1.5, blood glucose was normal, BNP was nonspecifically elevated to 273, COVID-19 influenza PCR testing was collected and resulted negative.  The patient's urinalysis was collected and was negative for hematuria or evidence of UTI.  Blood cultures and urine cultures were collected and pending.  Lactic acid was normal.  CT angio PE study was performed and results as follows: IMPRESSION:  1. No filling defect is identified in the pulmonary arterial tree to  suggest pulmonary embolus.  2. Moderate right and small left pleural effusions with associated  passive atelectasis.  3. Mild secondary pulmonary lobular septal thickening with  geographic ground-glass densities in both lungs, favoring mild  pulmonary edema.  4. Airway thickening and airway calcifications with airway narrowing  and plugging in both lower lobes.  5. Moderate cardiomegaly. Mitral valve calcification.  6. 2.2 by 1.4 cm left adrenal adenoma.  7. Thoracic spondylosis.  8. Aortic atherosclerosis.    Aortic Atherosclerosis (ICD10-I70.0).     The patient's presentation is most consistent with symptomatic anemia with a hemoglobin of 7 which is an acute change from her baseline with most recent hemoglobin measurement almost 2 years ago on 05/16/2020 of 13.6. In addition to mild  pulmonary edema and evidence of volume overload. Administered Lasix 46m. Given the patient is on Xarelto chronically for anticoagulation, suspect potential chronic GI bleed although no evidence for acute GI bleed at this time.  Given the patient's symptomatic anemia with fatigue, shortness of breath, hemoglobin of 7.0, plan for medical admission. Hospitalist medicine consulted for admission, Dr RMarlowe Saxaccepting.   Final Clinical Impression(s) / ED Diagnoses Final diagnoses:  Symptomatic anemia  Acute pulmonary edema ([facility]    Rx / DC Orders ED Discharge Orders     None         LRegan Lemming MD 03/17/22 2001

## 2022-03-17 NOTE — ED Notes (Signed)
RT at bedside.

## 2022-03-17 NOTE — ED Notes (Signed)
Client is a HIGH FALL RISK, bracelet placed on client, sr x 2 up, bed in lowest position, family at bedside, instructed not to attempt to get off bed without staff in room, [place] applied to client.

## 2022-03-17 NOTE — Plan of Care (Signed)

## 2022-03-17 NOTE — ED Notes (Signed)
Client arrived via EMS, from home, having pain and burning upon urination. VS with EMS 160/84, hx of afib, rate at 60's, SPO2 100% on RA, CBG 130's. Arrived very alert and oriented. Denies any fever. Denies any N/V/D

## 2022-03-17 NOTE — Plan of Care (Signed)
[facility] will assume care on arrival to accepting facility. Until arrival, care as per EDP. However, [facility] available 24/7 for questions and assistance.  Nursing staff, please page [facility]H Admits and Consults ([phone]) as soon as the patient arrives to the hospital.   

## 2022-03-18 ENCOUNTER — Inpatient Hospital Stay (HOSPITAL_COMMUNITY): Payer: Medicare Other

## 2022-03-18 DIAGNOSIS — I4821 Permanent atrial fibrillation: Secondary | ICD-10-CM

## 2022-03-18 DIAGNOSIS — J90 Pleural effusion, not elsewhere classified: Secondary | ICD-10-CM | POA: Diagnosis not present

## 2022-03-18 DIAGNOSIS — E1122 Type 2 diabetes mellitus with diabetic chronic kidney disease: Secondary | ICD-10-CM | POA: Diagnosis present

## 2022-03-18 DIAGNOSIS — I5033 Acute on chronic diastolic (congestive) heart failure: Secondary | ICD-10-CM | POA: Diagnosis present

## 2022-03-18 DIAGNOSIS — Z7901 Long term (current) use of anticoagulants: Secondary | ICD-10-CM | POA: Diagnosis not present

## 2022-03-18 DIAGNOSIS — J9601 Acute respiratory failure with hypoxia: Secondary | ICD-10-CM | POA: Diagnosis present

## 2022-03-18 DIAGNOSIS — D509 Iron deficiency anemia, unspecified: Secondary | ICD-10-CM | POA: Diagnosis present

## 2022-03-18 DIAGNOSIS — M47814 Spondylosis without myelopathy or radiculopathy, thoracic region: Secondary | ICD-10-CM | POA: Diagnosis present

## 2022-03-18 DIAGNOSIS — Z79899 Other long term (current) drug therapy: Secondary | ICD-10-CM | POA: Diagnosis not present

## 2022-03-18 DIAGNOSIS — Z8249 Family history of ischemic heart disease and other diseases of the circulatory system: Secondary | ICD-10-CM | POA: Diagnosis not present

## 2022-03-18 DIAGNOSIS — R0602 Shortness of breath: Secondary | ICD-10-CM | POA: Diagnosis not present

## 2022-03-18 DIAGNOSIS — I130 Hypertensive heart and chronic kidney disease with heart failure and stage 1 through stage 4 chronic kidney disease, or unspecified chronic kidney disease: Secondary | ICD-10-CM | POA: Diagnosis present

## 2022-03-18 DIAGNOSIS — J811 Chronic pulmonary edema: Secondary | ICD-10-CM

## 2022-03-18 DIAGNOSIS — Z8673 Personal history of transient ischemic attack (TIA), and cerebral infarction without residual deficits: Secondary | ICD-10-CM | POA: Diagnosis not present

## 2022-03-18 DIAGNOSIS — I700 Atherosclerosis of aorta: Secondary | ICD-10-CM | POA: Diagnosis present

## 2022-03-18 DIAGNOSIS — Z7984 Long term (current) use of oral hypoglycemic drugs: Secondary | ICD-10-CM | POA: Diagnosis not present

## 2022-03-18 DIAGNOSIS — Z91048 Other nonmedicinal substance allergy status: Secondary | ICD-10-CM | POA: Diagnosis not present

## 2022-03-18 DIAGNOSIS — D3502 Benign neoplasm of left adrenal gland: Secondary | ICD-10-CM | POA: Diagnosis present

## 2022-03-18 DIAGNOSIS — J810 Acute pulmonary edema: Secondary | ICD-10-CM | POA: Diagnosis present

## 2022-03-18 DIAGNOSIS — E039 Hypothyroidism, unspecified: Secondary | ICD-10-CM | POA: Diagnosis present

## 2022-03-18 DIAGNOSIS — Z91013 Allergy to seafood: Secondary | ICD-10-CM | POA: Diagnosis not present

## 2022-03-18 DIAGNOSIS — D649 Anemia, unspecified: Secondary | ICD-10-CM

## 2022-03-18 DIAGNOSIS — N1832 Chronic kidney disease, stage 3b: Secondary | ICD-10-CM | POA: Diagnosis present

## 2022-03-18 DIAGNOSIS — I509 Heart failure, unspecified: Secondary | ICD-10-CM | POA: Diagnosis not present

## 2022-03-18 DIAGNOSIS — E1121 Type 2 diabetes mellitus with diabetic nephropathy: Secondary | ICD-10-CM | POA: Diagnosis not present

## 2022-03-18 DIAGNOSIS — Z20822 Contact with and (suspected) exposure to covid-19: Secondary | ICD-10-CM | POA: Diagnosis present

## 2022-03-18 DIAGNOSIS — R350 Frequency of micturition: Secondary | ICD-10-CM | POA: Diagnosis present

## 2022-03-18 DIAGNOSIS — J439 Emphysema, unspecified: Secondary | ICD-10-CM | POA: Diagnosis not present

## 2022-03-18 DIAGNOSIS — Z882 Allergy status to sulfonamides status: Secondary | ICD-10-CM | POA: Diagnosis not present

## 2022-03-18 DIAGNOSIS — Z885 Allergy status to narcotic agent status: Secondary | ICD-10-CM | POA: Diagnosis not present

## 2022-03-18 DIAGNOSIS — G2581 Restless legs syndrome: Secondary | ICD-10-CM | POA: Diagnosis present

## 2022-03-18 LAB — CBC
HCT: 24.2 % — ABNORMAL LOW (ref 36.0–46.0)
Hemoglobin: 7.8 g/dL — ABNORMAL LOW (ref 12.0–15.0)
MCH: 29 pg (ref 26.0–34.0)
MCHC: 32.2 g/dL (ref 30.0–36.0)
MCV: 90 fL (ref 80.0–100.0)
Platelets: 217 10*3/uL (ref 150–400)
RBC: 2.69 MIL/uL — ABNORMAL LOW (ref 3.87–5.11)
RDW: 15.1 % (ref 11.5–15.5)
WBC: 6.7 10*3/uL (ref 4.0–10.5)
nRBC: 0 % (ref 0.0–0.2)

## 2022-03-18 LAB — GLUCOSE, CAPILLARY
Glucose-Capillary: 100 mg/dL — ABNORMAL HIGH (ref 70–99)
Glucose-Capillary: 137 mg/dL — ABNORMAL HIGH (ref 70–99)
Glucose-Capillary: 107 mg/dL — ABNORMAL HIGH (ref 70–99)
Glucose-Capillary: 118 mg/dL — ABNORMAL HIGH (ref 70–99)

## 2022-03-18 LAB — COMPREHENSIVE METABOLIC PANEL
ALT: 11 U/L (ref 0–44)
AST: 18 U/L (ref 15–41)
Albumin: 3.2 g/dL — ABNORMAL LOW (ref 3.5–5.0)
Alkaline Phosphatase: 62 U/L (ref 38–126)
Anion gap: 8 (ref 5–15)
BUN: 15 mg/dL (ref 8–23)
CO2: 22 mmol/L (ref 22–32)
Calcium: 8.5 mg/dL — ABNORMAL LOW (ref 8.9–10.3)
Chloride: 103 mmol/L (ref 98–111)
Creatinine, Ser: 0.89 mg/dL (ref 0.44–1.00)
GFR, Estimated: 57 mL/min — ABNORMAL LOW (ref >60)
Glucose, Bld: 134 mg/dL — ABNORMAL HIGH (ref 70–99)
Potassium: 4.1 mmol/L (ref 3.5–5.1)
Sodium: 133 mmol/L — ABNORMAL LOW (ref 135–145)
Total Bilirubin: 0.8 mg/dL (ref 0.3–1.2)
Total Protein: 6 g/dL — ABNORMAL LOW (ref 6.5–8.1)

## 2022-03-18 LAB — IRON AND TIBC
Iron: 20 ug/dL — ABNORMAL LOW (ref 28–170)
Saturation Ratios: 5 % — ABNORMAL LOW (ref 10.4–31.8)
TIBC: 433 ug/dL (ref 250–450)
UIBC: 413 ug/dL

## 2022-03-18 LAB — ECHOCARDIOGRAM COMPLETE
Area-P 1/2: 3.6 cm2
Calc EF: 54.9 %
Height: 63 in
MV M vel: 3.61 m/s
MV Peak grad: 52.1 mmHg
S' Lateral: 3 cm
Single Plane A2C EF: 54.7 %
Single Plane A4C EF: 57.8 %
Weight: 2352 oz

## 2022-03-18 LAB — CBC WITH DIFFERENTIAL/PLATELET
Abs Immature Granulocytes: 0.03 10*3/uL (ref 0.00–0.07)
Basophils Absolute: 0.1 10*3/uL (ref 0.0–0.1)
Basophils Relative: 1 %
Eosinophils Absolute: 0.1 10*3/uL (ref 0.0–0.5)
Eosinophils Relative: 2 %
HCT: 22.3 % — ABNORMAL LOW (ref 36.0–46.0)
Hemoglobin: 7 g/dL — ABNORMAL LOW (ref 12.0–15.0)
Immature Granulocytes: 0 %
Lymphocytes Relative: 17 %
Lymphs Abs: 1.5 10*3/uL (ref 0.7–4.0)
MCH: 28.3 pg (ref 26.0–34.0)
MCHC: 31.4 g/dL (ref 30.0–36.0)
MCV: 90.3 fL (ref 80.0–100.0)
Monocytes Absolute: 1 10*3/uL (ref 0.1–1.0)
Monocytes Relative: 12 %
Neutro Abs: 6 10*3/uL (ref 1.7–7.7)
Neutrophils Relative %: 68 %
Platelets: 257 10*3/uL (ref 150–400)
RBC: 2.47 MIL/uL — ABNORMAL LOW (ref 3.87–5.11)
RDW: 15.5 % (ref 11.5–15.5)
WBC: 8.7 10*3/uL (ref 4.0–10.5)
nRBC: 0 % (ref 0.0–0.2)

## 2022-03-18 LAB — HEMOGLOBIN A1C
Hgb A1c MFr Bld: 5.7 % — ABNORMAL HIGH (ref 4.8–5.6)
Mean Plasma Glucose: 116.89 mg/dL

## 2022-03-18 LAB — FERRITIN: Ferritin: 11 ng/mL (ref 11–307)

## 2022-03-18 LAB — PREPARE RBC (CROSSMATCH)

## 2022-03-18 LAB — PHOSPHORUS: Phosphorus: 4.2 mg/dL (ref 2.5–4.6)

## 2022-03-18 LAB — MAGNESIUM: Magnesium: 1.8 mg/dL (ref 1.7–2.4)

## 2022-03-18 MED ORDER — ACETAMINOPHEN 325 MG PO TABS
650.0000 mg | ORAL_TABLET | Freq: Four times a day (QID) | ORAL | Status: DC | PRN
  Administered 2022-03-19: 650 mg via ORAL
  Filled 2022-03-18: qty 2

## 2022-03-18 MED ORDER — MELATONIN 5 MG PO TABS
5.0000 mg | ORAL_TABLET | Freq: Every evening | ORAL | Status: DC | PRN
  Filled 2022-03-18: qty 1

## 2022-03-18 MED ORDER — SODIUM CHLORIDE 0.9 % IV SOLN
510.0000 mg | Freq: Once | INTRAVENOUS | Status: AC
  Administered 2022-03-18: 510 mg via INTRAVENOUS
  Filled 2022-03-18: qty 17

## 2022-03-18 MED ORDER — ROPINIROLE HCL 0.25 MG PO TABS
0.2500 mg | ORAL_TABLET | Freq: Every day | ORAL | Status: DC
  Administered 2022-03-18 (×2): 0.25 mg via ORAL
  Filled 2022-03-18 (×3): qty 1

## 2022-03-18 MED ORDER — PROCHLORPERAZINE EDISYLATE 10 MG/2ML IJ SOLN: 5.0000 mg | Freq: Four times a day (QID) | INTRAMUSCULAR | Status: DC | PRN

## 2022-03-18 MED ORDER — FUROSEMIDE 10 MG/ML IJ SOLN
20.0000 mg | Freq: Every day | INTRAMUSCULAR | Status: DC
  Administered 2022-03-18: 20 mg via INTRAVENOUS
  Filled 2022-03-18: qty 2

## 2022-03-18 MED ORDER — FUROSEMIDE 10 MG/ML IJ SOLN: 20.0000 mg | Freq: Two times a day (BID) | INTRAMUSCULAR | Status: DC

## 2022-03-18 MED ORDER — FUROSEMIDE 10 MG/ML IJ SOLN
40.0000 mg | Freq: Two times a day (BID) | INTRAMUSCULAR | Status: DC
  Administered 2022-03-18 – 2022-03-20 (×4): 40 mg via INTRAVENOUS
  Filled 2022-03-18 (×4): qty 4

## 2022-03-18 MED ORDER — POLYETHYLENE GLYCOL 3350 17 G PO PACK: 17.0000 g | PACK | Freq: Every day | ORAL | Status: DC | PRN

## 2022-03-18 NOTE — Evaluation (Signed)
Occupational Therapy Evaluation Patient Details Name: JANERA PEUGH MRN: [id number] DOB: Mar 20, 1919 Today's Date: 03/18/2022   History of Present Illness Ms. Blank is a 88 yr old female brought to the hospital with shortness of breath. She was found to have anemia and possible acute CHF. PMH: a fib, CVA, DM, HTN   Clinical Impression   The patient required min guard assist for sit to stand using a RW, for toileting at bathroom level, and for grooming in standing at the sink. Her O2 saturation was noted to be 88-89% on room air with activity. She was noted to be with slight deconditioning and mild generalized strength deficits. He will benefit from further OT services to maximize her safety and independence with ADLs, and to decrease the risk for further deconditioning.      Recommendations for follow up therapy are one component of a multi-disciplinary discharge planning process, led by the attending physician.  Recommendations may be updated based on patient status, additional functional criteria and insurance authorization.   Follow Up Recommendations  Home health OT    Assistance Recommended at Discharge Intermittent Supervision/Assistance  Patient can return home with the following Help with stairs or ramp for entrance;Assist for transportation;Assistance with cooking/housework    Functional Status Assessment  Patient has had a recent decline in their functional status and demonstrates the ability to make significant improvements in function in a reasonable and predictable amount of time.  Equipment Recommendations  None recommended by OT       Precautions / Restrictions Restrictions Weight Bearing Restrictions: No      Mobility  Transfers Overall transfer level: Needs assistance Equipment used: Rolling walker (2 wheels) Transfers: Sit to/from Stand Sit to Stand: Min guard          Balance     Sitting balance-Leahy Scale: Good       Standing balance-Leahy  Scale: Fair Standing balance comment: min guard using a rolling walker           ADL either performed or assessed with clinical judgement   ADL Overall ADL's : Needs assistance/impaired Eating/Feeding: Independent   Grooming: Min guard Grooming Details (indicate cue type and reason): She performed hand washing in standing at the sink.         Upper Body Dressing : Set up;Sitting Upper Body Dressing Details (indicate cue type and reason): simulated Lower Body Dressing: Minimal assistance Lower Body Dressing Details (indicate cue type and reason): for sock management Toilet Transfer: Ambulation;Grab bars   Toileting- Clothing Manipulation and Hygiene: Min guard;Sit to/from stand               Vision Baseline Vision/History: 1 Wears glasses              Pertinent Vitals/Pain Pain Assessment Pain Assessment: No/denies pain        Extremity/Trunk Assessment Upper Extremity Assessment Upper Extremity Assessment: Overall WFL for tasks assessed   Lower Extremity Assessment Lower Extremity Assessment: Overall WFL for tasks assessed       Communication     Cognition Arousal/Alertness: Awake/alert Behavior During Therapy: WFL for tasks assessed/performed Overall Cognitive Status: Within Functional Limits for tasks assessed          General Comments: Oriented x4, able to follow 1-2 step commands consistently                Home Living Family/patient expects to be discharged to:: Private residence Living Arrangements: Alone Available Help at Discharge: Family;Available PRN/intermittently Type of Home:  House Home Access: Stairs to enter [employer] of Steps: 2+3 Entrance Stairs-Rails: None Home Layout: Multi-level;Bed/bath upstairs               Home Equipment: Rollator (4 wheels);Cane - single point;BSC/3in1          Prior Functioning/Environment Prior Level of Function : Independent/Modified Independent              Mobility Comments: She used a rollator inside the home and cane when outside the home. ADLs Comments: She was modified independent to independent with ADLs, she had hired assistance for cleaning 1x per month, she did not drive, and she has a Insurance claims handler" come 1x per week to assist with showering. Her family brings her meals.        OT Problem List: Decreased strength;Decreased activity tolerance;Impaired balance (sitting and/or standing)      OT Treatment/Interventions: Self-care/ADL training;Therapeutic exercise;Energy conservation;DME and/or AE instruction;Patient/family education;Therapeutic activities;Balance training    OT Goals(Current goals can be found in the care plan section) Acute Rehab OT Goals Patient Stated Goal: to return home soon OT Goal Formulation: With patient Time For Goal Achievement: 04/01/22 Potential to Achieve Goals: Good  OT Frequency: Min 2X/week    Co-evaluation PT/OT/SLP Co-Evaluation/Treatment: Yes   PT goals addressed during session: Mobility/safety with mobility OT goals addressed during session: ADL's and self-care      AM-PAC OT "6 Clicks" Daily Activity     Outcome Measure Help from another person eating meals?: None Help from another person taking care of personal grooming?: A Little Help from another person toileting, which includes using toliet, bedpan, or urinal?: A Little Help from another person bathing (including washing, rinsing, drying)?: A Little Help from another person to put on and taking off regular upper body clothing?: None Help from another person to put on and taking off regular lower body clothing?: A Little 6 Click Score: 20   End of Session Equipment Utilized During Treatment: Gait belt;Rolling walker (2 wheels) Nurse Communication: Mobility status  Activity Tolerance: Patient tolerated treatment well Patient left: in bed;with call bell/phone within reach;with bed alarm set  OT Visit Diagnosis: Unsteadiness on feet  (R26.81);Muscle weakness (generalized) (M62.81)                Time: 5208-0223 OT Time Calculation (min): 23 min Charges:  OT General Charges $OT Visit: 1 Visit OT Evaluation $OT Eval Low Complexity: 1 Low    Lamichael Youkhana L Manoj Enriquez, OTR/L 03/18/2022, 11:24 AM

## 2022-03-18 NOTE — TOC Initial Note (Addendum)
Transition of Care [facility]) - Initial/Assessment Note    Patient Details  Name: Amanda [name] MRN: [id number] Date of Birth: 1918-08-17  Transition [facility]) CM/SW Contact:    Vassie Moselle, LCSW Phone Number: 03/18/2022, 1:01 PM  Clinical Narrative:                 Pt lives at home alone and has the following DME: rollator, cane, and BSC. CSW spoke with pt's daughter who shares that pt is mostly independent at baseline. She pays her own bills, goes to the store and salon. Pt's daughter states that she bring her mother pre-made meals. Pt's daughter is agreeable to home health recommendation.  Home Health PT/OT will be arranged for this pt prior to discharge.  TOC following for possible home O2 need.    Update 2:20pm: [facility] is to provide home health PT/OT for this pt at discharge. Home health orders will need to be placed prior to discharge.    Expected Discharge Plan: [place] Barriers to Discharge: Continued Medical Work up   Patient Goals and CMS Choice Patient states their goals for this hospitalization and ongoing recovery are:: For pt to return home per daughter   Choice offered to / list presented to : Adult Children, HC POA / Guardian  Expected Discharge Plan and Services Expected Discharge Plan: [place] In-house Referral: NA Discharge Planning Services: CM Consult Post Acute Care Choice: [place] arrangements for the past 2 months: Single Family Home                 DME Arranged: N/A DME Agency: NA                  Prior Living Arrangements/Services Living arrangements for the past 2 months: Single Family Home Lives with:: Self Patient language and need for interpreter reviewed:: Yes Do you feel safe going back to the place where you live?: Yes      Need for Family Participation in Patient Care: Yes (Comment) Care giver support system in place?: No (comment) Current home services:  DME Criminal Activity/Legal Involvement Pertinent to Current Situation/Hospitalization: No - Comment as needed  Activities of Daily Living Home Assistive Devices/Equipment: Walker (specify type), Cane (specify quad or straight) ADL Screening (condition at time of admission) Patient's cognitive ability adequate to safely complete daily activities?: Yes Is the patient deaf or have difficulty hearing?: No Does the patient have difficulty seeing, even when wearing glasses/contacts?: No Does the patient have difficulty concentrating, remembering, or making decisions?: No Patient able to express need for assistance with ADLs?: Yes Does the patient have difficulty dressing or bathing?: No Independently performs ADLs?: Yes (appropriate for developmental age) Does the patient have difficulty walking or climbing stairs?: Yes Weakness of Legs: Both Weakness of Arms/Hands: None  Permission Sought/Granted Permission sought to share information with : Family Supports, Case Manager Permission granted to share information with : Yes, Release of Information Signed  Share Information with NAME: Lamont Dowdy     Permission granted to share info w Relationship: Daughter/Legal Guardian  Permission granted to share info w Contact Information: [phone]  Emotional Assessment   Attitude/Demeanor/Rapport: Unable to Assess Affect (typically observed): Unable to Assess Orientation: : Oriented to Self, Oriented to Place, Oriented to  Time, Oriented to Situation Alcohol / Substance Use: Not Applicable Psych Involvement: No (comment)  Admission diagnosis:  Acute pulmonary edema (HCC) [J81.0] Symptomatic anemia [D64.9] Acute respiratory failure with  hypoxia ([place]) [J96.01] Patient Active Problem List   Diagnosis Date Noted   Pulmonary edema 03/18/2022   Normocytic anemia 03/18/2022   Acute respiratory failure with hypoxia (HCC) 03/18/2022   Chronic a-fib ([place]) 01/05/2019   Dysarthria 01/02/2019    Aphasia 01/01/2019   HTN (hypertension) 01/01/2019   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 09/28/2015   Anemia 09/28/2015   Vertigo    Chronic anticoagulation 05/24/2014   History of CVA (cerebrovascular accident) 05/23/2014   Permanent atrial fibrillation ([place]) 05/23/2014   Diabetes mellitus type 2, controlled ([place]) 05/23/2014   Slurred speech    Encounter for therapeutic drug monitoring 08/19/2013   Benign hypertensive heart disease without heart failure 10/09/2010   Hypothyroidism 10/09/2010   Atrial fibrillation ([place]) 09/03/2010   PCP:  Mayra Neer, MD Pharmacy:   [facility] [place], [address] AT [address] [place] [address] Phone: [phone] Fax: [phone]     Social Determinants of Health (SDOH) Interventions    Readmission Risk Interventions     No data to display

## 2022-03-18 NOTE — Evaluation (Signed)
Physical Therapy Evaluation Patient Details Name: Amanda [name] MRN: [id number] DOB: 23-Nov-1918 Today's Date: 03/18/2022  History of Present Illness  Amanda [name] is a 88 yr old female brought to the hospital with shortness of breath and admitted 03/17/22 with Symptomatic severe iron deficiency anemia s/p PRBCs and possible acute CHF. PMH: a fib, CVA, DM, HTN  Clinical Impression  Pt admitted with above diagnosis.  Pt currently with functional limitations due to the deficits listed below (see PT Problem List). Pt will benefit from skilled PT to increase their independence and safety with mobility to allow discharge to the venue listed below.  Pt assisted to bathroom and then ambulated in hallway. SPO2 88-89% on room air during ambulating and RN notified.  Pt would benefit from HHPT upon d/c.        Recommendations for follow up therapy are one component of a multi-disciplinary discharge planning process, led by the attending physician.  Recommendations may be updated based on patient status, additional functional criteria and insurance authorization.  Follow Up Recommendations Home health PT      Assistance Recommended at Discharge PRN  Patient can return home with the following  Help with stairs or ramp for entrance    Equipment Recommendations None recommended by PT  Recommendations for Other Services       Functional Status Assessment Patient has had a recent decline in their functional status and demonstrates the ability to make significant improvements in function in a reasonable and predictable amount of time.     Precautions / Restrictions Precautions Precautions: Fall Precaution Comments: monitor sats Restrictions Weight Bearing Restrictions: No      Mobility  Bed Mobility Overal bed mobility: Needs Assistance Bed Mobility: Sit to Supine       Sit to supine: Supervision        Transfers Overall transfer level: Needs assistance Equipment used: Rolling  walker (2 wheels) Transfers: Sit to/from Stand Sit to Stand: Min guard           General transfer comment: verbal cues for hand placement to self assist (pt in recliner on arrival and 100% on 2L, pt also 100% at rest on room air so ambulated on room air)    Ambulation/Gait Ambulation/Gait assistance: Min guard Gait Distance (Feet): 80 Feet Assistive device: Rolling walker (2 wheels) Gait Pattern/deviations: Step-through pattern, Decreased stride length       General Gait Details: pt prefers rollator and required cues for negotiating with RW; SPO2 88-89% on room air during ambulation so returned to 2L O2 Amanda [name] upon return to bed and RN notified  Stairs            Wheelchair Mobility    Modified Rankin (Stroke Patients Only)       Balance Overall balance assessment: No apparent balance deficits (not formally assessed)           Standing balance-Leahy Scale: Good Standing balance comment: able to wash hands at sink without support                             Pertinent Vitals/Pain Pain Assessment Pain Assessment: No/denies pain    Home Living Family/patient expects to be discharged to:: Private residence Living Arrangements: Alone Available Help at Discharge: Family;Available PRN/intermittently Type of Home: House Home Access: Stairs to enter Entrance Stairs-Rails: None Entrance Stairs-Number of Steps: 2+3 Alternate Level Stairs-Number of Steps: 1 step Home Layout: Two level Home Equipment: Rollator (4 wheels);Cane -  single point;BSC/3in1      Prior Function Prior Level of Function : Independent/Modified Independent             Mobility Comments: She used a rollator inside the home and cane when outside the home. ADLs Comments: She was modified independent to independent with ADLs, she had hired assistance for cleaning 1x per month, she did not drive, and she has a Insurance claims handler" come 1x per week to assist with showering. Her family brings her  meals.     Hand Dominance        Extremity/Trunk Assessment   Upper Extremity Assessment Upper Extremity Assessment: Overall WFL for tasks assessed    Lower Extremity Assessment Lower Extremity Assessment: Overall WFL for tasks assessed       Communication   Communication: HOH  Cognition Arousal/Alertness: Awake/alert Behavior During Therapy: WFL for tasks assessed/performed Overall Cognitive Status: Within Functional Limits for tasks assessed                                 General Comments: Oriented x4, able to follow 1-2 step commands consistently        General Comments      Exercises     Assessment/Plan    PT Assessment Patient needs continued PT services  PT Problem List Decreased strength;Decreased activity tolerance;Decreased mobility       PT Treatment Interventions Gait training;DME instruction;Therapeutic exercise;Balance training;Functional mobility training;Therapeutic activities;Patient/family education;Stair training    PT Goals (Current goals can be found in the Care Plan section)  Acute Rehab PT Goals PT Goal Formulation: With patient Time For Goal Achievement: 03/26/22 Potential to Achieve Goals: Good    Frequency Min 3X/week     Co-evaluation     PT goals addressed during session: Mobility/safety with mobility OT goals addressed during session: ADL's and self-care       AM-PAC PT "6 Clicks" Mobility  Outcome Measure Help needed turning from your back to your side while in a flat bed without using bedrails?: A Little Help needed moving from lying on your back to sitting on the side of a flat bed without using bedrails?: A Little Help needed moving to and from a bed to a chair (including a wheelchair)?: A Little Help needed standing up from a chair using your arms (e.g., wheelchair or bedside chair)?: A Little Help needed to walk in hospital room?: A Little Help needed climbing 3-5 steps with a railing? : A Lot 6  Click Score: 17    End of Session   Activity Tolerance: Patient tolerated treatment well Patient left: in bed;with call bell/phone within reach;with bed alarm set Nurse Communication: Mobility status PT Visit Diagnosis: Other abnormalities of gait and mobility (R26.89)    Time: 1006-1030 PT Time Calculation (min) (ACUTE ONLY): 24 min   Charges:   PT Evaluation $PT Eval Low Complexity: 1 Low         Kati PT, DPT Physical Therapist Acute Rehabilitation Services Preferred contact method: Secure Chat Weekend Pager Only: [phone] Office: [place] 03/18/2022, 11:38 AM

## 2022-03-18 NOTE — Progress Notes (Addendum)
[facility] Progress Note  Patient: Amanda [name]     PJA:[id number]  DOA: 03/17/2022   PCP: Mayra Neer, MD       Brief hospital course: This is a 88 year old female who has atrial fibrillation, diabetes mellitus, essential hypertension and a history of a CVA.  She presents with progressively increasing shortness of breath.  In the ED: Noted to have a hemoglobin of 7.0 CTA moderate right and small left pleural effusions with associated atelectasis, groundglass densities in bilateral lungs suggestive of pulmonary edema, moderate cardiomegaly, ossification of the mitral valve, airway thickening with airway calcifications and narrowing and plugging of bilateral lower lobe, incidental left adrenal mass suggestive of adenoma.  Subjective:  Admits to dyspnea on exertion and pedal edema. No cough, sore throat of flu like symptoms  Assessment and Plan: Principal Problem:   Pulmonary edema, b/l pleural effusions- acute hypoxic resp failure - RR in the 20s when in the ED - pulse ox 88% on exertion today - awaiting 2 D ECHO - cont Lasix- increase from 20 mg daily to 40 mg BID  Active Problems:  Normocytic anemia -Hemoglobin last checked in 2021 was 13.6 - She presented with a hemoglobin of 7.0 and was transfused 1 U PRBC - will recheck Hgb -Anemia panel reviewed-she is noted to be significantly iron deficient and will l need oral iron when discharged - FOB negative  Essential hypertension - Lisinopril reordered - Amlodipine currently on hold    Atrial fibrillation, permanent -She has been taking Xarelto as outpatient which is being continued    Hypothyroidism -Continue levothyroxine  CKD stage IIIb - GFR is about 57 - Continue to follow creatinine with diuresis    Diabetes mellitus type 2, controlled (HCC) -Hold glimepiride and on Novolog SSI Hemoglobin A1C    Component Value Date/Time   HGBA1C 5.7 (H) 03/18/2022 0108      Code Status: Full Code DVT  prophylaxis: Xarelto Consultants: none Level of Care: Level of care: Telemetry Total time on patient care:  Objective:   Vitals:   03/18/22 0352 03/18/22 0418 03/18/22 0500 03/18/22 0600  BP: (!) 156/64 (!) 148/65 (!) 145/65 135/78  Pulse: 62 77 75 75  Resp: 20 20    Temp: 97.8 F (36.6 C) 97.6 F (36.4 C) 97.8 F (36.6 C) 98.1 F (36.7 C)  TempSrc: Oral Oral Oral Oral  SpO2: 100% 99% 99% 99%  Weight:      Height:       Filed Weights   03/17/22 2240  Weight: 70.2 kg   Exam: General exam: Appears comfortable  HEENT: oral mucosa moist Respiratory system: crackles at b/l bases Cardiovascular system: S1 & S2 heard  Gastrointestinal system: Abdomen soft, non-tender, nondistended. Normal bowel sounds   Extremities: No cyanosis, clubbing - right sided pedal edema- none on left Psychiatry:  Mood & affect appropriate.    Imaging and lab data was personally reviewed    CBC: Recent Labs  Lab 03/17/22 1648 03/18/22 0108  WBC 6.0 8.7  NEUTROABS 3.5 6.0  HGB 7.0* 7.0*  HCT 22.8* 22.3*  MCV 91.2 90.3  PLT 250 341   Basic Metabolic Panel: Recent Labs  Lab 03/17/22 1648 03/18/22 0108  NA 131* 133*  K 4.5 4.1  CL 100 103  CO2 23 22  GLUCOSE 110* 134*  BUN 17 15  CREATININE 0.90 0.89  CALCIUM 9.1 8.5*  MG  --  1.8  PHOS  --  4.2   GFR: Estimated Creatinine Clearance: 30.8  mL/min (by C-G formula based on SCr of 0.89 mg/dL).  Scheduled Meds:  sodium chloride   Intravenous Once   furosemide  20 mg Intravenous Daily   insulin aspart  0-5 Units Subcutaneous QHS   insulin aspart  0-9 Units Subcutaneous TID WC   levothyroxine  88 mcg Oral Q0600   lisinopril  20 mg Oral Daily   multivitamin  1 tablet Oral BID   omega-3 acid ethyl esters  1 g Oral Daily   Rivaroxaban  15 mg Oral Q supper   rOPINIRole  0.25 mg Oral QHS   vitamin B-12  100 mcg Oral Daily   Continuous Infusions:   LOS: 0 days   Author: Debbe Odea  03/18/2022 8:22 AM

## 2022-03-18 NOTE — Progress Notes (Signed)
  Echocardiogram 2D Echocardiogram has been performed.  Lana Fish 03/18/2022, 4:07 PM

## 2022-03-19 DIAGNOSIS — J810 Acute pulmonary edema: Secondary | ICD-10-CM | POA: Diagnosis not present

## 2022-03-19 LAB — TYPE AND SCREEN
ABO/RH(D): O POS
Antibody Screen: NEGATIVE
Unit division: 0

## 2022-03-19 LAB — CBC
HCT: 27.2 % — ABNORMAL LOW (ref 36.0–46.0)
Hemoglobin: 8.8 g/dL — ABNORMAL LOW (ref 12.0–15.0)
MCH: 29.1 pg (ref 26.0–34.0)
MCHC: 32.4 g/dL (ref 30.0–36.0)
MCV: 90.1 fL (ref 80.0–100.0)
Platelets: 230 10*3/uL (ref 150–400)
RBC: 3.02 MIL/uL — ABNORMAL LOW (ref 3.87–5.11)
RDW: 15.4 % (ref 11.5–15.5)
WBC: 7 10*3/uL (ref 4.0–10.5)
nRBC: 0 % (ref 0.0–0.2)

## 2022-03-19 LAB — BASIC METABOLIC PANEL
Anion gap: 6 (ref 5–15)
BUN: 14 mg/dL (ref 8–23)
CO2: 26 mmol/L (ref 22–32)
Calcium: 8.6 mg/dL — ABNORMAL LOW (ref 8.9–10.3)
Chloride: 104 mmol/L (ref 98–111)
Creatinine, Ser: 0.96 mg/dL (ref 0.44–1.00)
GFR, Estimated: 52 mL/min — ABNORMAL LOW (ref >60)
Glucose, Bld: 100 mg/dL — ABNORMAL HIGH (ref 70–99)
Potassium: 3.6 mmol/L (ref 3.5–5.1)
Sodium: 136 mmol/L (ref 135–145)

## 2022-03-19 LAB — URINE CULTURE: Culture: NO GROWTH

## 2022-03-19 LAB — GLUCOSE, CAPILLARY
Glucose-Capillary: 97 mg/dL (ref 70–99)
Glucose-Capillary: 135 mg/dL — ABNORMAL HIGH (ref 70–99)
Glucose-Capillary: 133 mg/dL — ABNORMAL HIGH (ref 70–99)
Glucose-Capillary: 195 mg/dL — ABNORMAL HIGH (ref 70–99)

## 2022-03-19 LAB — BPAM RBC
Blood Product Expiration Date: 202311262359
ISSUE DATE / TIME: 202310310339
Unit Type and Rh: 5100

## 2022-03-19 MED ORDER — IPRATROPIUM-ALBUTEROL 0.5-2.5 (3) MG/3ML IN SOLN: 3.0000 mL | RESPIRATORY_TRACT | Status: DC | PRN

## 2022-03-19 MED ORDER — TRAZODONE HCL 50 MG PO TABS: 50.0000 mg | ORAL_TABLET | Freq: Every evening | ORAL | Status: DC | PRN

## 2022-03-19 MED ORDER — ONDANSETRON HCL 4 MG/2ML IJ SOLN: 4.0000 mg | Freq: Four times a day (QID) | INTRAMUSCULAR | Status: DC | PRN

## 2022-03-19 MED ORDER — SENNOSIDES-DOCUSATE SODIUM 8.6-50 MG PO TABS: 1.0000 | ORAL_TABLET | Freq: Every evening | ORAL | Status: DC | PRN

## 2022-03-19 MED ORDER — HYDRALAZINE HCL 20 MG/ML IJ SOLN: 10.0000 mg | INTRAMUSCULAR | Status: DC | PRN

## 2022-03-19 MED ORDER — ROPINIROLE HCL 0.25 MG PO TABS
0.2500 mg | ORAL_TABLET | Freq: Four times a day (QID) | ORAL | Status: DC
  Administered 2022-03-19 – 2022-03-20 (×4): 0.25 mg via ORAL
  Filled 2022-03-19 (×3): qty 1

## 2022-03-19 MED ORDER — GUAIFENESIN 100 MG/5ML PO LIQD: 5.0000 mL | ORAL | Status: DC | PRN

## 2022-03-19 MED ORDER — ROPINIROLE HCL 0.25 MG PO TABS: 0.2500 mg | ORAL_TABLET | Freq: Four times a day (QID) | ORAL | Status: DC

## 2022-03-19 MED ORDER — SODIUM CHLORIDE 0.9 % IV SOLN
250.0000 mg | Freq: Every day | INTRAVENOUS | Status: AC
  Administered 2022-03-19 – 2022-03-20 (×2): 250 mg via INTRAVENOUS
  Filled 2022-03-19 (×2): qty 20

## 2022-03-19 MED ORDER — METOPROLOL TARTRATE 5 MG/5ML IV SOLN: 5.0000 mg | INTRAVENOUS | Status: DC | PRN

## 2022-03-19 NOTE — Progress Notes (Signed)
PROGRESS NOTE    Amanda [name]  YCX:[id number] DOB: 13-Mar-1919 DOA: 03/17/2022 PCP: Mayra Neer, MD   Brief Narrative:  88 year old female who has atrial fibrillation, diabetes mellitus, essential hypertension and a history of a CVA.  She presents with progressively increasing shortness of breath.  CTA of the chest showed bilateral pleural effusion with atelectasis and evidence of pulmonary edema.  There is also evidence of left adrenal mass suggestive of adenoma.   Assessment & Plan:  Principal Problem:   Pulmonary edema Active Problems:   Atrial fibrillation (HCC)   Hypothyroidism   Diabetes mellitus type 2, controlled (HCC)   HTN (hypertension)   Chronic a-fib (HCC)   Normocytic anemia   Acute respiratory failure with hypoxia (HCC)    Pulmonary edema, b/l pleural effusions- acute hypoxic resp failure Acute congestive heart failure with preserved ejection fraction, 55%. -Echocardiogram showed preserved ejection fraction but elevated PA pressures.  We will diurese her with caution given her age and comorbidities.  Slowly attempt to wean her off oxygen.  Continue IV Lasix for 24 hours    Normocytic anemia with iron deficiency -FOBT is negative, baseline hemoglobin back in 2021 used to be 13.6 but during this admission is in range of 7.  Repeat hemoglobin after transfusion this morning is 8.8.  We will give her IV iron. Eventually she will need p.o. iron upon discharge. Patient and daughter tells me that she had colonoscopy long time ago and has never had endoscopy.  At this time they are not interested in any endoscopic work-up.   Essential hypertension -Currently on lisinopril.  Norvasc on hold.  Getting IV diuretics.  Will order as needed medications     Atrial fibrillation, permanent -On Xarelto     Hypothyroidism -Synthroid   CKD stage IIIb -Creatinine is stable at 0.9, continue to monitor as patient is getting diuresed     Diabetes mellitus type 2,  controlled ([place]) -Home meds on hold.  A1c 5.7.  Sliding scale and Accu-Cheks    Patient and daughter does not want any aggressive medical intervention at this time.  They are in agreement that we should proceed with IV diuretics, IV iron and as needed transfusion if necessary.  Does not want any advanced cardiac work-up or any further GI evaluation including colonoscopy/endoscopy.  They will also discuss long-term Xarelto with her outpatient cardiology  DVT prophylaxis: On Xarelto Code Status: Full code Family Communication: Daughter at bedside  Status is: Inpatient Remains inpatient appropriate because: Continue hospital stay for hemoglobin monitoring.  Also getting IV diuretics.     Subjective:  Seen and examined at bedside, does not have any complaints.  Tells me shortness of breath has improved including bilateral lower extremity swelling.  Daughter and patient both agree that they do not want any aggressive intervention including advanced cardiac work-up, endoscopy or colonoscopy.  They are okay with diuresing, IV iron and as needed transfusion for medical management.  They will also discuss long-term Xarelto use with outpatient cardiology RN in the room with [place] during my evaluation  Examination:  General exam: Appears calm and comfortable, 2 L nasal cannula Respiratory system: Clear to auscultation. Respiratory effort normal. Cardiovascular system: S1 & S2 heard, RRR. No JVD, murmurs, rubs, gallops or clicks.  Trace bilateral lower remedy pitting edema Gastrointestinal system: Abdomen is nondistended, soft and nontender. No organomegaly or masses felt. Normal bowel sounds heard. Central nervous system: Alert and oriented. No focal neurological deficits. Extremities: Symmetric 5 x 5 power. Skin: No  rashes, lesions or ulcers Psychiatry: Judgement and insight appear normal. Mood & affect appropriate.     Objective: Vitals:   03/18/22 1352 03/18/22 1514 03/18/22 2022 03/19/22  0456  BP: (!) 146/79  (!) 168/57 (!) 163/63  Pulse: 61  62 (!) 56  Resp: '18  19 18  '$ Temp: 97.9 F (36.6 C)  98.5 F (36.9 C) 98 F (36.7 C)  TempSrc: Oral  Oral Oral  SpO2: 100%  100% 100%  Weight:  66.7 kg  68.3 kg  Height:        Intake/Output Summary (Last 24 hours) at 03/19/2022 0923 Last data filed at 03/19/2022 0456 Gross per 24 hour  Intake 480 ml  Output 1200 ml  Net -720 ml   Filed Weights   03/17/22 2240 03/18/22 1514 03/19/22 0456  Weight: 70.2 kg 66.7 kg 68.3 kg     Data Reviewed:   CBC: Recent Labs  Lab 03/17/22 1648 03/18/22 0108 03/18/22 1319  WBC 6.0 8.7 6.7  NEUTROABS 3.5 6.0  --   HGB 7.0* 7.0* 7.8*  HCT 22.8* 22.3* 24.2*  MCV 91.2 90.3 90.0  PLT 250 257 546   Basic Metabolic Panel: Recent Labs  Lab 03/17/22 1648 03/18/22 0108 03/19/22 0516  NA 131* 133* 136  K 4.5 4.1 3.6  CL 100 103 104  CO2 '23 22 26  '$ GLUCOSE 110* 134* 100*  BUN '17 15 14  '$ CREATININE 0.90 0.89 0.96  CALCIUM 9.1 8.5* 8.6*  MG  --  1.8  --   PHOS  --  4.2  --    GFR: Estimated Creatinine Clearance: 28.2 mL/min (by C-G formula based on SCr of 0.96 mg/dL). Liver Function Tests: Recent Labs  Lab 03/17/22 1648 03/18/22 0108  AST 15 18  ALT 9 11  ALKPHOS 73 62  BILITOT 0.6 0.8  PROT 6.6 6.0*  ALBUMIN 3.8 3.2*   No results for input(s): "LIPASE", "AMYLASE" in the last 168 hours. No results for input(s): "AMMONIA" in the last 168 hours. Coagulation Profile: Recent Labs  Lab 03/17/22 1648  INR 1.5*   Cardiac Enzymes: No results for input(s): "CKTOTAL", "CKMB", "CKMBINDEX", "TROPONINI" in the last 168 hours. BNP (last 3 results) No results for input(s): "PROBNP" in the last 8760 hours. HbA1C: Recent Labs    03/18/22 0108  HGBA1C 5.7*   CBG: Recent Labs  Lab 03/18/22 0723 03/18/22 1145 03/18/22 1626 03/18/22 2206 03/19/22 0743  GLUCAP 100* 137* 107* 118* 97   Lipid Profile: No results for input(s): "CHOL", "HDL", "LDLCALC", "TRIG", "CHOLHDL",  "LDLDIRECT" in the last 72 hours. Thyroid Function Tests: No results for input(s): "TSH", "T4TOTAL", "FREET4", "T3FREE", "THYROIDAB" in the last 72 hours. Anemia Panel: Recent Labs    03/18/22 0108  FERRITIN 11  TIBC 433  IRON 20*   Sepsis Labs: Recent Labs  Lab 03/17/22 1648 03/17/22 1935  LATICACIDVEN 0.9 0.8    Recent Results (from the past 240 hour(s))  Blood Culture (routine x 2)     Status: None (Preliminary result)   Collection Time: 03/17/22  4:48 PM   Specimen: BLOOD  Result Value Ref Range Status   Specimen Description   Final    BLOOD RIGHT ANTECUBITAL Performed at [facility], [address] [place], [place] [address]    Special Requests   Final    Blood Culture adequate volume BOTTLES DRAWN AEROBIC AND ANAEROBIC Performed at [facility], [address] [place], [place] [address]    Culture   Final  NO GROWTH < 24 HOURS Performed at [facility] [address], [place], [place] [address]    Report Status PENDING  Incomplete  Blood Culture (routine x 2)     Status: None (Preliminary result)   Collection Time: 03/17/22  4:48 PM   Specimen: BLOOD  Result Value Ref Range Status   Specimen Description   Final    BLOOD LEFT ANTECUBITAL Performed at [facility], [address] [place], [place] [address]    Special Requests   Final    Blood Culture adequate volume BOTTLES DRAWN AEROBIC AND ANAEROBIC Performed at [facility], [address], [place], [place] [address]    Culture   Final    NO GROWTH < 24 HOURS Performed at [facility], [place] [address], [place], [place] [address]    Report Status PENDING  Incomplete  Urine Culture     Status: None   Collection Time: 03/17/22  5:00 PM   Specimen: Urine, Clean Catch  Result Value Ref Range Status   Specimen Description   Final    URINE, CLEAN CATCH Performed at [facility], [address] [place], [place] [address]    Special Requests   Final    NONE Performed at [facility], [address] [place], [place] [address]    Culture   Final    NO GROWTH Performed at [facility], [place] [address], [place],  [address]    Report Status 03/19/2022 FINAL  Final  Resp Panel by RT-PCR (Flu A&B, Covid) Urine, Clean Catch     Status: None   Collection Time: 03/17/22  5:00 PM   Specimen: Urine, Clean Catch; Nasal Swab  Result Value Ref Range Status   SARS Coronavirus 2 by RT PCR NEGATIVE NEGATIVE Final    Comment: (NOTE) SARS-CoV-2 target nucleic acids are NOT DETECTED.  The SARS-CoV-2 RNA is generally detectable in upper respiratory specimens during the acute phase of infection. The lowest concentration of SARS-CoV-2 viral copies this assay can detect is 138 copies/mL. A negative result does not preclude SARS-Cov-2 infection and should not be used as the sole basis for treatment or other patient management decisions. A negative result may occur with  improper specimen collection/handling, submission of specimen other than nasopharyngeal swab, presence of viral mutation(s) within the areas targeted by this assay, and inadequate number of viral copies(<138 copies/mL). A negative result must be combined with clinical observations, patient history, and epidemiological information. The expected result is Negative.  Fact Sheet for Patients:  EntrepreneurPulse.com.au  Fact Sheet for Healthcare Providers:  IncredibleEmployment.be  This test is no t yet approved or cleared by the [place] FDA and  has been authorized for detection and/or diagnosis of SARS-CoV-2 by FDA under an Emergency Use Authorization (EUA). This EUA will remain  in effect (meaning this test can be used) for the duration of the COVID-19 declaration under Section 564(b)(1) of the Act, 21 U.S.C.section 360bbb-3(b)(1), unless the  authorization is terminated  or revoked sooner.       Influenza A by PCR NEGATIVE NEGATIVE Final   Influenza B by PCR NEGATIVE NEGATIVE Final    Comment: (NOTE) The Xpert Xpress SARS-CoV-2/FLU/RSV plus assay is intended as an aid in the diagnosis of influenza from Nasopharyngeal swab specimens and should not be used as a sole basis for treatment. Nasal washings and aspirates are unacceptable for Xpert Xpress SARS-CoV-2/FLU/RSV testing.  Fact Sheet for Patients: EntrepreneurPulse.com.au  Fact  Sheet for Healthcare Providers: IncredibleEmployment.be  This test is not yet approved or cleared by the [place] and has been authorized for detection and/or diagnosis of SARS-CoV-2 by FDA under an Emergency Use Authorization (EUA). This EUA will remain in effect (meaning this test can be used) for the duration of the COVID-19 declaration under Section 564(b)(1) of the Act, 21 U.S.C. section 360bbb-3(b)(1), unless the authorization is terminated or revoked.  Performed at [facility], [address], [place], [place] [address]          Radiology Studies: ECHOCARDIOGRAM COMPLETE  Result Date: 03/18/2022    ECHOCARDIOGRAM REPORT   Patient Name:   MAYSA LYNN Date of Exam: 03/18/2022 Medical Rec #:  [id number]          Height:       63.0 in Accession #:    [id number]         Weight:       154.8 lb Date of Birth:  [birth year]          BSA:          1.734 m Patient Age:    89 years          BP:           135/78 mmHg Patient Gender: F                  HR:           61 bpm. Exam Location:  Inpatient Procedure: 2D Echo Indications:    CHF  History:        Patient has prior history of Echocardiogram examinations, most                 recent 01/02/2019. Stroke, Arrythmias:Atrial Fibrillation; Risk                 Factors:Diabetes.  Sonographer:    Harvie Junior Referring Phys: [id number] [place]  Sonographer Comments:  Technically difficult study due to poor echo windows. IMPRESSIONS  1. Left ventricular ejection fraction, by estimation, is 55 to 60%. The left ventricle has normal function. The left ventricle has no regional wall motion abnormalities. Left ventricular diastolic parameters are indeterminate.  2. Right ventricular systolic function is normal. The right ventricular size is normal. There is moderately elevated pulmonary artery systolic pressure. The estimated right ventricular systolic pressure is 71.0 mmHg.  3. Right atrial size was severely dilated.  4. MVA 3 cm2 by 2D Planimetry.. The mitral valve is degenerative. No evidence of mitral valve regurgitation. Mild mitral stenosis.  5. The aortic valve is tricuspid. There is mild calcification of the aortic valve. There is mild thickening of the aortic valve. Aortic valve regurgitation is not visualized. Aortic valve sclerosis is present, with no evidence of aortic valve stenosis. Comparison(s): No significant change from prior study. FINDINGS  Left Ventricle: Left ventricular ejection fraction, by estimation, is 55 to 60%. The left ventricle has normal function. The left ventricle has no regional wall motion abnormalities. The left ventricular internal cavity size was normal in size. There is  no left ventricular hypertrophy. Left ventricular diastolic parameters are indeterminate. Right Ventricle: The right ventricular size is normal. No increase in right ventricular wall thickness. Right ventricular systolic function is normal. There is moderately elevated pulmonary artery systolic pressure. The tricuspid regurgitant velocity is 3.18 m/s, and with an assumed right atrial pressure of 8 mmHg, the estimated right ventricular systolic pressure is 62.6 mmHg. Left Atrium:  Left atrial size was normal in size. Right Atrium: Right atrial size was severely dilated. Pericardium: Trivial pericardial effusion is present. The pericardial effusion is posterior to the left  ventricle. Mitral Valve: MVA 3 cm2 by 2D Planimetry. The mitral valve is degenerative in appearance. No evidence of mitral valve regurgitation. Mild mitral valve stenosis. Tricuspid Valve: The tricuspid valve is normal in structure. Tricuspid valve regurgitation is mild . No evidence of tricuspid stenosis. Aortic Valve: The aortic valve is tricuspid. There is mild calcification of the aortic valve. There is mild thickening of the aortic valve. Aortic valve regurgitation is not visualized. Aortic valve sclerosis is present, with no evidence of aortic valve stenosis. Pulmonic Valve: The pulmonic valve was grossly normal. Pulmonic valve regurgitation is trivial. No evidence of pulmonic stenosis. Aorta: The aortic root is normal in size and structure. IAS/Shunts: No atrial level shunt detected by color flow Doppler.  LEFT VENTRICLE PLAX 2D LVIDd:         4.60 cm     Diastology LVIDs:         3.00 cm     LV e' medial:    6.74 cm/s LV PW:         0.90 cm     LV E/e' medial:  20.5 LV IVS:        0.90 cm     LV e' lateral:   7.83 cm/s LVOT diam:     1.80 cm     LV E/e' lateral: 17.6 LV SV:         46 LV SV Index:   27 LVOT Area:     2.54 cm  LV Volumes (MOD) LV vol d, MOD A2C: 39.1 ml LV vol d, MOD A4C: 66.6 ml LV vol s, MOD A2C: 17.7 ml LV vol s, MOD A4C: 28.1 ml LV SV MOD A2C:     21.4 ml LV SV MOD A4C:     66.6 ml LV SV MOD BP:      28.4 ml RIGHT VENTRICLE RV Basal diam:  4.30 cm RV Mid diam:    3.90 cm TAPSE (M-mode): 1.2 cm LEFT ATRIUM             Index        RIGHT ATRIUM           Index LA diam:        3.90 cm 2.25 cm/m   RA Area:     24.40 cm LA Vol (A2C):   36.1 ml 20.82 ml/m  RA Volume:   82.90 ml  47.81 ml/m LA Vol (A4C):   60.2 ml 34.72 ml/m LA Biplane Vol: 46.6 ml 26.87 ml/m  AORTIC VALVE             PULMONIC VALVE LVOT Vmax:   83.30 cm/s  PV Vmax:          0.92 m/s LVOT Vmean:  55.100 cm/s PV Peak grad:     3.4 mmHg LVOT VTI:    0.181 m     PR End Diast Vel: 1.07 msec  AORTA Ao Root diam: 3.00 cm MITRAL  VALVE                TRICUSPID VALVE MV Area (PHT): 3.60 cm     TR Peak grad:   40.4 mmHg MV Decel Time: 211 msec     TR Vmax:        318.00 cm/s MR Peak grad: 52.1 mmHg MR Vmax:  361.00 cm/s   SHUNTS MV E velocity: 138.00 cm/s  Systemic VTI:  0.18 m MV A velocity: 49.20 cm/s   Systemic Diam: 1.80 cm MV E/A ratio:  2.80 Rudean Haskell MD Electronically signed by Rudean Haskell MD Signature Date/Time: 03/18/2022/4:57:27 PM    Final    CT Angio Chest PE W and/or Wo Contrast  Result Date: 03/17/2022 CLINICAL DATA:  Chest pain.  Dysuria. EXAM: CT ANGIOGRAPHY CHEST WITH CONTRAST TECHNIQUE: Multidetector CT imaging of the chest was performed using the standard protocol during bolus administration of intravenous contrast. Multiplanar CT image reconstructions and MIPs were obtained to evaluate the vascular anatomy. RADIATION DOSE REDUCTION: This exam was performed according to the departmental dose-optimization program which includes automated exposure control, adjustment of the mA and/or kV according to patient size and/or use of iterative reconstruction technique. CONTRAST:  37m OMNIPAQUE IOHEXOL 350 MG/ML SOLN COMPARISON:  Chest radiograph 09/11/2016 FINDINGS: Cardiovascular: No filling defect is identified in the pulmonary arterial tree to suggest pulmonary embolus. Coronary, aortic arch, and branch vessel atherosclerotic vascular disease. Moderate cardiomegaly. Mitral valve calcification. Mediastinum/Nodes: Unremarkable Lungs/Pleura: Moderate right and small left pleural effusion with associated passive atelectasis. Mild secondary pulmonary lobular septal thickening at the lung apices with geographic ground-glass densities in both lungs. Mild airway thickening and notable airway calcifications with airway narrowing and plugging in both lower lobes. Upper Abdomen: Abdominal aortic and systemic atherosclerosis. 2.2 by 1.4 cm left adrenal mass, internal density 5 Hounsfield units compatible with  adrenal adenoma. No further imaging workup of this lesion is indicated. Musculoskeletal: Thoracic spondylosis. Review of the MIP images confirms the above findings. IMPRESSION: 1. No filling defect is identified in the pulmonary arterial tree to suggest pulmonary embolus. 2. Moderate right and small left pleural effusions with associated passive atelectasis. 3. Mild secondary pulmonary lobular septal thickening with geographic ground-glass densities in both lungs, favoring mild pulmonary edema. 4. Airway thickening and airway calcifications with airway narrowing and plugging in both lower lobes. 5. Moderate cardiomegaly. Mitral valve calcification. 6. 2.2 by 1.4 cm left adrenal adenoma. 7. Thoracic spondylosis. 8. Aortic atherosclerosis. Aortic Atherosclerosis (ICD10-I70.0). Electronically Signed   By: WVan ClinesM.D.   On: 03/17/2022 18:52   [place] Img Lower Bilateral  Result Date: 03/17/2022 CLINICAL DATA:  Lower extremity swelling EXAM: BILATERAL LOWER EXTREMITY VENOUS DOPPLER ULTRASOUND TECHNIQUE: Gray-scale sonography with graded compression, as well as color Doppler and duplex ultrasound were performed to evaluate the lower extremity deep venous systems from the level of the common femoral vein and including the common femoral, femoral, profunda femoral, popliteal and calf veins including the posterior tibial, peroneal and gastrocnemius veins when visible. The superficial great saphenous vein was also interrogated. Spectral Doppler was utilized to evaluate flow at rest and with distal augmentation maneuvers in the common femoral, femoral and popliteal veins. COMPARISON:  Chest XR, 09/11/2016. FINDINGS: RIGHT LOWER EXTREMITY VENOUS Normal compressibility of the RIGHT common femoral, superficial femoral, and popliteal veins, as well as the visualized calf veins. Visualized portions of profunda femoral vein and great saphenous vein unremarkable. No filling defects to suggest DVT on grayscale or  color Doppler imaging. Doppler waveforms show normal direction of venous flow, normal respiratory plasticity and response to augmentation. OTHER No evidence of superficial thrombophlebitis or abnormal fluid collection. Limitations: none LEFT LOWER EXTREMITY VENOUS Normal compressibility of the LEFT common femoral, superficial femoral, and popliteal veins, as well as the visualized calf veins. Visualized portions of profunda femoral vein and great saphenous vein unremarkable. No filling  defects to suggest DVT on grayscale or color Doppler imaging. Doppler waveforms show normal direction of venous flow, normal respiratory plasticity and response to augmentation. OTHER No evidence of superficial thrombophlebitis or abnormal fluid collection. Limitations: none IMPRESSION: No evidence of femoropopliteal DVT or superficial thrombophlebitis within either lower extremity. Michaelle Birks, MD [facility] Electronically Signed   By: Michaelle Birks M.D.   On: 03/17/2022 17:12        Scheduled Meds:  sodium chloride   Intravenous Once   furosemide  40 mg Intravenous BID   insulin aspart  0-5 Units Subcutaneous QHS   insulin aspart  0-9 Units Subcutaneous TID WC   levothyroxine  88 mcg Oral Q0600   lisinopril  20 mg Oral Daily   multivitamin  1 tablet Oral BID   omega-3 acid ethyl esters  1 g Oral Daily   Rivaroxaban  15 mg Oral Q supper   rOPINIRole  0.25 mg Oral QHS   vitamin B-12  100 mcg Oral Daily   Continuous Infusions:   LOS: 1 day   Time spent= 35 mins    Emmelina Mcloughlin Arsenio Loader, MD [facility]  If 7PM-7AM, please contact night-coverage  03/19/2022, 9:23 AM

## 2022-03-19 NOTE — Plan of Care (Signed)
  Problem: Education: Goal: Knowledge of General Education information will improve Description Including pain rating scale, medication(s)/side effects and non-pharmacologic comfort measures Outcome: Progressing   Problem: Health Behavior/Discharge Planning: Goal: Ability to manage health-related needs will improve Outcome: Progressing   

## 2022-03-20 ENCOUNTER — Inpatient Hospital Stay (HOSPITAL_COMMUNITY): Payer: Medicare Other

## 2022-03-20 DIAGNOSIS — J810 Acute pulmonary edema: Secondary | ICD-10-CM | POA: Diagnosis not present

## 2022-03-20 DIAGNOSIS — E1121 Type 2 diabetes mellitus with diabetic nephropathy: Secondary | ICD-10-CM | POA: Diagnosis not present

## 2022-03-20 DIAGNOSIS — J9601 Acute respiratory failure with hypoxia: Secondary | ICD-10-CM | POA: Diagnosis not present

## 2022-03-20 DIAGNOSIS — I4821 Permanent atrial fibrillation: Secondary | ICD-10-CM | POA: Diagnosis not present

## 2022-03-20 LAB — BASIC METABOLIC PANEL
Anion gap: 9 (ref 5–15)
BUN: 18 mg/dL (ref 8–23)
CO2: 25 mmol/L (ref 22–32)
Calcium: 8.8 mg/dL — ABNORMAL LOW (ref 8.9–10.3)
Chloride: 99 mmol/L (ref 98–111)
Creatinine, Ser: 0.92 mg/dL (ref 0.44–1.00)
GFR, Estimated: 55 mL/min — ABNORMAL LOW (ref >60)
Glucose, Bld: 109 mg/dL — ABNORMAL HIGH (ref 70–99)
Potassium: 3.4 mmol/L — ABNORMAL LOW (ref 3.5–5.1)
Sodium: 133 mmol/L — ABNORMAL LOW (ref 135–145)

## 2022-03-20 LAB — CBC
HCT: 25.8 % — ABNORMAL LOW (ref 36.0–46.0)
Hemoglobin: 8.2 g/dL — ABNORMAL LOW (ref 12.0–15.0)
MCH: 28.6 pg (ref 26.0–34.0)
MCHC: 31.8 g/dL (ref 30.0–36.0)
MCV: 89.9 fL (ref 80.0–100.0)
Platelets: 236 10*3/uL (ref 150–400)
RBC: 2.87 MIL/uL — ABNORMAL LOW (ref 3.87–5.11)
RDW: 15.3 % (ref 11.5–15.5)
WBC: 8.7 10*3/uL (ref 4.0–10.5)
nRBC: 0 % (ref 0.0–0.2)

## 2022-03-20 LAB — GLUCOSE, CAPILLARY
Glucose-Capillary: 115 mg/dL — ABNORMAL HIGH (ref 70–99)
Glucose-Capillary: 191 mg/dL — ABNORMAL HIGH (ref 70–99)

## 2022-03-20 LAB — MAGNESIUM: Magnesium: 1.8 mg/dL (ref 1.7–2.4)

## 2022-03-20 MED ORDER — FUROSEMIDE 40 MG PO TABS: 40.0000 mg | ORAL_TABLET | Freq: Every day | ORAL | 0 refills | Status: DC | PRN

## 2022-03-20 MED ORDER — POTASSIUM CHLORIDE CRYS ER 20 MEQ PO TBCR
40.0000 meq | EXTENDED_RELEASE_TABLET | Freq: Once | ORAL | Status: AC
  Administered 2022-03-20: 40 meq via ORAL
  Filled 2022-03-20: qty 2

## 2022-03-20 MED ORDER — FERROUS SULFATE 325 (65 FE) MG PO TBEC: 325.0000 mg | DELAYED_RELEASE_TABLET | Freq: Two times a day (BID) | ORAL | 0 refills | Status: DC

## 2022-03-20 NOTE — Progress Notes (Signed)
Physical Therapy Treatment Patient Details Name: Amanda [name] MRN: [id number] DOB: 08-20-1918 Today's Date: 03/20/2022   History of Present Illness Amanda [name] is a 88 yr old female brought to the hospital with shortness of breath and admitted 03/17/22 with Symptomatic severe iron deficiency anemia s/p PRBCs and possible acute CHF. PMH: a fib, CVA, DM, HTN    PT Comments    Pt sleeping in the bed upon arrival but agreeable to be seen, reporting no pain but "I just feel tired." Pt required supervision for bed mobility and min guard for transfers. Pt ambulated with RW in hallway with min guard 123f. Pt completed stair training with min guard, no overt LOB noted. Returned pt to bed and repositioned for comfort and so she could eat her lunch tray. Pt reports she is close to her baseline but just feels weak and tired. Discharge destination remains appropriate. We will continue to follow acutely.     Recommendations for follow up therapy are one component of a multi-disciplinary discharge planning process, led by the attending physician.  Recommendations may be updated based on patient status, additional functional criteria and insurance authorization.  Follow Up Recommendations  Home health PT     Assistance Recommended at Discharge PRN  Patient can return home with the following Help with stairs or ramp for entrance   Equipment Recommendations  None recommended by PT    Recommendations for Other Services       Precautions / Restrictions Precautions Precautions: Fall Precaution Comments: monitor sats Restrictions Weight Bearing Restrictions: No     Mobility  Bed Mobility Overal bed mobility: Needs Assistance Bed Mobility: Sit to Supine, Supine to Sit     Supine to sit: Supervision Sit to supine: Supervision   General bed mobility comments: For safety only    Transfers Overall transfer level: Needs assistance Equipment used: Rolling walker (2 wheels) Transfers:  Sit to/from Stand Sit to Stand: Min guard           General transfer comment: Min guard for safety only, no physical assist required    Ambulation/Gait Ambulation/Gait assistance: Min guard Gait Distance (Feet): 120 Feet Assistive device: Rolling walker (2 wheels) Gait Pattern/deviations: Step-through pattern, Decreased stride length Gait velocity: decreased     General Gait Details: Pt ambualted with RW and min guard, no physical assist required or overt LOB noted. Pt able to complete wayfinding task back to room without cuing and self-corrected when she attemtped to enter the wrong room once she read the doorsign.   Stairs Stairs: Yes Stairs assistance: Min guard Stair Management: Two rails, Step to pattern, Forwards Number of Stairs: 2 General stair comments: Pt completed stair mobility with min guard, no physical assist required or overt LOB noted, step-to pattern.   Wheelchair Mobility    Modified Rankin (Stroke Patients Only)       Balance Overall balance assessment: No apparent balance deficits (not formally assessed)   Sitting balance-Leahy Scale: Good       Standing balance-Leahy Scale: Good Standing balance comment: able to wash hands at sink without support                            Cognition Arousal/Alertness: Awake/alert Behavior During Therapy: WFL for tasks assessed/performed Overall Cognitive Status: Within Functional Limits for tasks assessed  General Comments: Oriented x4, able to follow 1-2 step commands consistently        Exercises      General Comments        Pertinent Vitals/Pain Pain Assessment Pain Assessment: No/denies pain    Home Living                          Prior Function            PT Goals (current goals can now be found in the care plan section) Acute Rehab PT Goals PT Goal Formulation: With patient Time For Goal Achievement:  03/26/22 Potential to Achieve Goals: Good Progress towards PT goals: Progressing toward goals    Frequency    Min 3X/week      PT Plan Current plan remains appropriate    Co-evaluation              AM-PAC PT "6 Clicks" Mobility   Outcome Measure  Help needed turning from your back to your side while in a flat bed without using bedrails?: A Little Help needed moving from lying on your back to sitting on the side of a flat bed without using bedrails?: A Little Help needed moving to and from a bed to a chair (including a wheelchair)?: A Little Help needed standing up from a chair using your arms (e.g., wheelchair or bedside chair)?: A Little Help needed to walk in hospital room?: A Little Help needed climbing 3-5 steps with a railing? : A Little 6 Click Score: 18    End of Session Equipment Utilized During Treatment: Gait belt Activity Tolerance: Patient tolerated treatment well;No increased pain Patient left: in bed;with call bell/phone within reach;with bed alarm set Nurse Communication: Mobility status PT Visit Diagnosis: Other abnormalities of gait and mobility (R26.89)     Time: 6063-0160 PT Time Calculation (min) (ACUTE ONLY): 23 min  Charges:  $Gait Training: 23-37 mins                     Coolidge Breeze, PT, DPT [facility] Department Office: [phone] Weekend pager: [phone]   Coolidge Breeze 03/20/2022, 12:18 PM

## 2022-03-20 NOTE — Discharge Instructions (Signed)
Follow with Amanda Neer, MD for repeat bloodwork in 3-4 days  Please get a complete blood count and chemistry panel checked by your Primary MD at your next visit, and again as instructed by your Primary MD. Please get your medications reviewed and adjusted by your Primary MD.  Please request your Primary MD to go over all Hospital Tests and Procedure/Radiological results at the follow up, please get all Hospital records sent to your Prim MD by signing hospital release before you go home.  In some cases, there will be blood work, cultures and biopsy results pending at the time of your discharge. Please request that your primary care M.D. goes through all the records of your hospital data and follows up on these results.  If you had Pneumonia of Lung problems at the Hospital: Please get a 2 view Chest X ray done in 6-8 weeks after hospital discharge or sooner if instructed by your Primary MD.  If you have Congestive Heart Failure: Please call your Cardiologist or Primary MD anytime you have any of the following symptoms:  1) 3 pound weight gain in 24 hours or 5 pounds in 1 week  2) shortness of breath, with or without a dry hacking cough  3) swelling in the hands, feet or stomach  4) if you have to sleep on extra pillows at night in order to breathe  Follow cardiac low salt diet and 1.5 lit/day fluid restriction.  If you have diabetes Accuchecks 4 times/day, Once in AM empty stomach and then before each meal. Log in all results and show them to your primary doctor at your next visit. If any glucose reading is under 80 or above 300 call your primary MD immediately.  If you have Seizure/Convulsions/Epilepsy: Please do not drive, operate heavy machinery, participate in activities at heights or participate in high speed sports until you have seen by Primary MD or a Neurologist and advised to do so again. Per [facility] statutes, patients with seizures are not allowed to drive until  they have been seizure-free for six months.  Use caution when using heavy equipment or power tools. Avoid working on ladders or at heights. Take showers instead of baths. Ensure the water temperature is not too high on the home water heater. Do not go swimming alone. Do not lock yourself in a room alone (i.e. bathroom). When caring for infants or small children, sit down when holding, feeding, or changing them to minimize risk of injury to the child in the event you have a seizure. Maintain good sleep hygiene. Avoid alcohol.   If you had Gastrointestinal Bleeding: Please ask your Primary MD to check a complete blood count within one week of discharge or at your next visit. Your endoscopic/colonoscopic biopsies that are pending at the time of discharge, will also need to followed by your Primary MD.  Get Medicines reviewed and adjusted. Please take all your medications with you for your next visit with your Primary MD  Please request your Primary MD to go over all hospital tests and procedure/radiological results at the follow up, please ask your Primary MD to get all Hospital records sent to his/her office.  If you experience worsening of your admission symptoms, develop shortness of breath, life threatening emergency, suicidal or homicidal thoughts you must seek medical attention immediately by calling 911 or calling your MD immediately  if symptoms less severe.  You must read complete instructions/literature along with all the possible adverse reactions/side effects for all the  Medicines you take and that have been prescribed to you. Take any new Medicines after you have completely understood and accpet all the possible adverse reactions/side effects.   Do not drive or operate heavy machinery when taking Pain medications.   Do not take more than prescribed Pain, Sleep and Anxiety Medications  Special Instructions: If you have smoked or chewed Tobacco  in the last 2 yrs please stop smoking, stop  any regular Alcohol  and or any Recreational drug use.  Wear Seat belts while driving.  Please note You were cared for by a hospitalist during your hospital stay. If you have any questions about your discharge medications or the care you received while you were in the hospital after you are discharged, you can call the unit and asked to speak with the hospitalist on call if the hospitalist that took care of you is not available. Once you are discharged, your primary care physician will handle any further medical issues. Please note that NO REFILLS for any discharge medications will be authorized once you are discharged, as it is imperative that you return to your primary care physician (or establish a relationship with a primary care physician if you do not have one) for your aftercare needs so that they can reassess your need for medications and monitor your lab values.  You can reach the hospitalist office at phone [phone] or fax [phone]   If you do not have a primary care physician, you can call [phone] for a physician referral.  Activity: As tolerated with Full fall precautions use walker/cane & assistance as needed    Diet: low sodium  Disposition Home

## 2022-03-20 NOTE — Discharge Summary (Signed)
Physician Discharge Summary  Amanda [name] UXL:[id number] DOB: 06-Mar-1919 DOA: 03/17/2022  PCP: Mayra Neer, MD  Admit date: 03/17/2022 Discharge date: 03/20/2022  Admitted From: home Disposition:  home  Recommendations for Outpatient Follow-up:  Follow up with PCP in 1-2 weeks Please obtain BMP/CBC in 3-4 days  Home Health: PT Equipment/Devices: none  Discharge Condition: stable CODE STATUS: Full code  HPI: Per admitting MD, Amanda [name] is a 88 y.o. female with medical history significant for chronic atrial fibrillation on Xarelto, CVA, type 2 diabetes, essential hypertension, who presented to [facility] ED from home where she lives alone, accompanied by her son and daughter, with complaints of gradually worsening exertional dyspnea for the past 1 week and urinary symptoms for the past few days.  No reported subjective fevers or chills. In the ED, work-up revealed pulmonary edema, bilateral pleural effusions, lower extremity edema, bilateral JVD, with concern for acute CHF.  CT angio chest was negative for PE.  Lower extremity Doppler ultrasound was negative for DVT. Work-up also revealed new anemia with hemoglobin of 7.0 from baseline of 13, 2 years ago.  UA was negative for pyuria.  The patient received IV Lasix 20 mg x 1 in the ED.  TRH, hospitalist service, was asked to admit.  The patient was admitted by Dr. Marlowe Sax, and transferred to [facility] long hospital telemetry unit as observation status. Upon arrival, 1 unit PRBCs was ordered to be transfused.  Iron studies show severe iron deficiency.  1 dose Feraheme ordered to be administered 510 mg x1.  Hospital Course / Discharge diagnoses: Principal Problem:   Pulmonary edema Active Problems:   Atrial fibrillation (HCC)   Hypothyroidism   Diabetes mellitus type 2, controlled ([place])   HTN (hypertension)   Chronic a-fib (HCC)   Normocytic anemia   Acute respiratory failure with hypoxia (HCC)  Principal  problem Acute hypoxic respiratory failure due to pulmonary edema and b/l pleural effusions in the setting of acute on chronic diastolic CHF -patient was admitted to the hospital with respiratory failure.  She was placed on IV Lasix with excellent diuresis, she is net -4 L.  Weight has improved from 154 pounds to 151.  She was able to be weaned off to room air, feeling back to baseline, she will be transition to oral Lasix and discharged home in stable condition.  She was counseled for daily weights.  A 2D echo was done which showed preserved EF but elevated PA pressures.   Active problems Normocytic anemia with iron deficiency -FOBT is negative, baseline hemoglobin back in 2021 used to be 13.6 but during this admission is in range of 7.  She was transfused units of packed red blood cells and received IV iron, hemoglobin is stable posttransfusion and will be transition to p.o. iron supplements. Patient and daughter tells me that she had colonoscopy long time ago and has never had endoscopy.  At this time they are not interested in any endoscopic work-up. Essential hypertension -resume home medications  Atrial fibrillation, permanent -On Xarelto Hypothyroidism-Synthroid CKD stage IIIb -creatinine has remained stable with diuresis Diabetes mellitus type 2, controlled ([place]) -resume home regimen  Sepsis ruled out  Discharge Instructions  Allergies as of 03/20/2022       Reactions   Calcium-vitamin D [calcium Carb-ergocalciferol] Other (See Comments)   Constipation    Tape Other (See Comments)   Skin very thin, tears easily   Codeine Nausea Only   Hydrocodone Nausea Only   Metoprolol Nausea Only  Rythmol [propafenone Hcl] Other (See Comments)   Dizziness    Shellfish Allergy Itching, Rash   Mussels only.   Sulfa Antibiotics Nausea Only        Medication List     TAKE these medications    acetaminophen 500 MG tablet Commonly known as: TYLENOL Take 1,000 mg by mouth every 6 (six)  hours as needed (for pain).   Arnicare Arnica Crea Apply 1 application  topically 2 (two) times daily as needed (leg pains).   Benefiber Powd Take 1 Scoop by mouth daily.   ferrous sulfate 325 (65 FE) MG EC tablet Take 1 tablet (325 mg total) by mouth 2 (two) times daily.   FISH OIL PO Take 1 capsule by mouth every evening.   furosemide 40 MG tablet Commonly known as: Lasix Take 1 tablet (40 mg total) by mouth daily as needed for fluid or edema.   glimepiride 1 MG tablet Commonly known as: AMARYL Take 1.5 mg by mouth daily with breakfast.   levothyroxine 88 MCG tablet Commonly known as: SYNTHROID Take 88 mcg by mouth daily before breakfast.   lisinopril 20 MG tablet Commonly known as: ZESTRIL Take 20 mg by mouth daily.   loratadine 10 MG tablet Commonly known as: CLARITIN Take 10 mg by mouth daily. [employer] brand - Allerclear   MAGNESIUM OXIDE PO Take 1 tablet by mouth every evening.   meclizine 25 MG tablet Commonly known as: ANTIVERT Take one every 6-8 hours as needed for dizziness What changed:  how much to take how to take this when to take this reasons to take this additional instructions   PRESERVISION AREDS 2 PO Take 1 capsule by mouth 2 (two) times daily.   PROBIOTIC DAILY PO Take 2 capsules by mouth daily as needed (digestion).   rOPINIRole 0.25 MG tablet Commonly known as: REQUIP Take 0.25 mg by mouth See admin instructions. 0.25 mg around lunch time, 0.25 mg at bedtime around 2230   sodium chloride 0.65 % nasal spray Commonly known as: OCEAN Place 1 spray into the nose daily as needed (nasal congestion).   VITAMIN B-12 PO Take 1 tablet by mouth daily.   VITAMIN B-3 PO Take 1 tablet by mouth daily.   VITAMIN D-3 PO Take 1 capsule by mouth daily.   Xarelto 15 MG Tabs tablet Generic drug: Rivaroxaban TAKE 1 TABLET(15 MG) BY MOUTH DAILY WITH SUPPER What changed: See the new instructions.        Follow-up Information     [facility]   Why: [facility] is to provide home health physical and occupational therapy               Consultations: none  Procedures/Studies:  DG CHEST PORT 1 VIEW  Result Date: 03/20/2022 CLINICAL DATA:  Dyspnea, progressive increase in shortness of breath, admitted 3 days ago EXAM: PORTABLE CHEST 1 VIEW COMPARISON:  Portable exam 0822 hours compared to 09/11/2016 FINDINGS: Enlargement of cardiac silhouette. Mediastinal contours and pulmonary vascularity normal. Atherosclerotic calcification aorta. Bronchitic changes and hyperinflation question COPD. No acute infiltrate or pneumothorax. Tiny bibasilar pleural effusions. Osseous demineralization. IMPRESSION: Enlargement of cardiac silhouette with suspected COPD changes. Tiny bibasilar pleural effusions. Aortic Atherosclerosis (ICD10-I70.0) and Emphysema (ICD10-J43.9). Electronically Signed   By: Lavonia Dana M.D.   On: 03/20/2022 09:03   ECHOCARDIOGRAM COMPLETE  Result Date: 03/18/2022    ECHOCARDIOGRAM REPORT   Patient Name:   Amanda [name] Date of Exam: 03/18/2022 Medical Rec #:  [id number]  Height:       63.0 in Accession #:    [id number]         Weight:       154.8 lb Date of Birth:  1919-02-03          BSA:          1.734 m Patient Age:    88 years          BP:           135/78 mmHg Patient Gender: F                  HR:           61 bpm. Exam Location:  Inpatient Procedure: 2D Echo Indications:    CHF  History:        Patient has prior history of Echocardiogram examinations, most                 recent 01/02/2019. Stroke, Arrythmias:Atrial Fibrillation; Risk                 Factors:Diabetes.  Sonographer:    Harvie Junior Referring Phys: [id number] [place]  Sonographer Comments: Technically difficult study due to poor echo windows. IMPRESSIONS  1. Left ventricular ejection fraction, by estimation, is 55 to 60%. The left ventricle has normal function. The left ventricle has no regional wall motion abnormalities.  Left ventricular diastolic parameters are indeterminate.  2. Right ventricular systolic function is normal. The right ventricular size is normal. There is moderately elevated pulmonary artery systolic pressure. The estimated right ventricular systolic pressure is 88.5 mmHg.  3. Right atrial size was severely dilated.  4. MVA 3 cm2 by 2D Planimetry.. The mitral valve is degenerative. No evidence of mitral valve regurgitation. Mild mitral stenosis.  5. The aortic valve is tricuspid. There is mild calcification of the aortic valve. There is mild thickening of the aortic valve. Aortic valve regurgitation is not visualized. Aortic valve sclerosis is present, with no evidence of aortic valve stenosis. Comparison(s): No significant change from prior study. FINDINGS  Left Ventricle: Left ventricular ejection fraction, by estimation, is 55 to 60%. The left ventricle has normal function. The left ventricle has no regional wall motion abnormalities. The left ventricular internal cavity size was normal in size. There is  no left ventricular hypertrophy. Left ventricular diastolic parameters are indeterminate. Right Ventricle: The right ventricular size is normal. No increase in right ventricular wall thickness. Right ventricular systolic function is normal. There is moderately elevated pulmonary artery systolic pressure. The tricuspid regurgitant velocity is 3.18 m/s, and with an assumed right atrial pressure of 8 mmHg, the estimated right ventricular systolic pressure is 02.7 mmHg. Left Atrium: Left atrial size was normal in size. Right Atrium: Right atrial size was severely dilated. Pericardium: Trivial pericardial effusion is present. The pericardial effusion is posterior to the left ventricle. Mitral Valve: MVA 3 cm2 by 2D Planimetry. The mitral valve is degenerative in appearance. No evidence of mitral valve regurgitation. Mild mitral valve stenosis. Tricuspid Valve: The tricuspid valve is normal in structure. Tricuspid  valve regurgitation is mild . No evidence of tricuspid stenosis. Aortic Valve: The aortic valve is tricuspid. There is mild calcification of the aortic valve. There is mild thickening of the aortic valve. Aortic valve regurgitation is not visualized. Aortic valve sclerosis is present, with no evidence of aortic valve stenosis. Pulmonic Valve: The pulmonic valve was grossly normal. Pulmonic valve regurgitation is trivial. No evidence of pulmonic stenosis.  Aorta: The aortic root is normal in size and structure. IAS/Shunts: No atrial level shunt detected by color flow Doppler.  LEFT VENTRICLE PLAX 2D LVIDd:         4.60 cm     Diastology LVIDs:         3.00 cm     LV e' medial:    6.74 cm/s LV PW:         0.90 cm     LV E/e' medial:  20.5 LV IVS:        0.90 cm     LV e' lateral:   7.83 cm/s LVOT diam:     1.80 cm     LV E/e' lateral: 17.6 LV SV:         46 LV SV Index:   27 LVOT Area:     2.54 cm  LV Volumes (MOD) LV vol d, MOD A2C: 39.1 ml LV vol d, MOD A4C: 66.6 ml LV vol s, MOD A2C: 17.7 ml LV vol s, MOD A4C: 28.1 ml LV SV MOD A2C:     21.4 ml LV SV MOD A4C:     66.6 ml LV SV MOD BP:      28.4 ml RIGHT VENTRICLE RV Basal diam:  4.30 cm RV Mid diam:    3.90 cm TAPSE (M-mode): 1.2 cm LEFT ATRIUM             Index        RIGHT ATRIUM           Index LA diam:        3.90 cm 2.25 cm/m   RA Area:     24.40 cm LA Vol (A2C):   36.1 ml 20.82 ml/m  RA Volume:   82.90 ml  47.81 ml/m LA Vol (A4C):   60.2 ml 34.72 ml/m LA Biplane Vol: 46.6 ml 26.87 ml/m  AORTIC VALVE             PULMONIC VALVE LVOT Vmax:   83.30 cm/s  PV Vmax:          0.92 m/s LVOT Vmean:  55.100 cm/s PV Peak grad:     3.4 mmHg LVOT VTI:    0.181 m     PR End Diast Vel: 1.07 msec  AORTA Ao Root diam: 3.00 cm MITRAL VALVE                TRICUSPID VALVE MV Area (PHT): 3.60 cm     TR Peak grad:   40.4 mmHg MV Decel Time: 211 msec     TR Vmax:        318.00 cm/s MR Peak grad: 52.1 mmHg MR Vmax:      361.00 cm/s   SHUNTS MV E velocity: 138.00 cm/s  Systemic  VTI:  0.18 m MV A velocity: 49.20 cm/s   Systemic Diam: 1.80 cm MV E/A ratio:  2.80 Rudean Haskell MD Electronically signed by Rudean Haskell MD Signature Date/Time: 03/18/2022/4:57:27 PM    Final    CT Angio Chest PE W and/or Wo Contrast  Result Date: 03/17/2022 CLINICAL DATA:  Chest pain.  Dysuria. EXAM: CT ANGIOGRAPHY CHEST WITH CONTRAST TECHNIQUE: Multidetector CT imaging of the chest was performed using the standard protocol during bolus administration of intravenous contrast. Multiplanar CT image reconstructions and MIPs were obtained to evaluate the vascular anatomy. RADIATION DOSE REDUCTION: This exam was performed according to the departmental dose-optimization program which includes automated exposure control, adjustment of the mA and/or kV  according to patient size and/or use of iterative reconstruction technique. CONTRAST:  23m OMNIPAQUE IOHEXOL 350 MG/ML SOLN COMPARISON:  Chest radiograph 09/11/2016 FINDINGS: Cardiovascular: No filling defect is identified in the pulmonary arterial tree to suggest pulmonary embolus. Coronary, aortic arch, and branch vessel atherosclerotic vascular disease. Moderate cardiomegaly. Mitral valve calcification. Mediastinum/Nodes: Unremarkable Lungs/Pleura: Moderate right and small left pleural effusion with associated passive atelectasis. Mild secondary pulmonary lobular septal thickening at the lung apices with geographic ground-glass densities in both lungs. Mild airway thickening and notable airway calcifications with airway narrowing and plugging in both lower lobes. Upper Abdomen: Abdominal aortic and systemic atherosclerosis. 2.2 by 1.4 cm left adrenal mass, internal density 5 Hounsfield units compatible with adrenal adenoma. No further imaging workup of this lesion is indicated. Musculoskeletal: Thoracic spondylosis. Review of the MIP images confirms the above findings. IMPRESSION: 1. No filling defect is identified in the pulmonary arterial tree to  suggest pulmonary embolus. 2. Moderate right and small left pleural effusions with associated passive atelectasis. 3. Mild secondary pulmonary lobular septal thickening with geographic ground-glass densities in both lungs, favoring mild pulmonary edema. 4. Airway thickening and airway calcifications with airway narrowing and plugging in both lower lobes. 5. Moderate cardiomegaly. Mitral valve calcification. 6. 2.2 by 1.4 cm left adrenal adenoma. 7. Thoracic spondylosis. 8. Aortic atherosclerosis. Aortic Atherosclerosis (ICD10-I70.0). Electronically Signed   By: WVan ClinesM.D.   On: 03/17/2022 18:52   [place]Venous Img Lower Bilateral  Result Date: 03/17/2022 CLINICAL DATA:  Lower extremity swelling EXAM: BILATERAL LOWER EXTREMITY VENOUS DOPPLER ULTRASOUND TECHNIQUE: Gray-scale sonography with graded compression, as well as color Doppler and duplex ultrasound were performed to evaluate the lower extremity deep venous systems from the level of the common femoral vein and including the common femoral, femoral, profunda femoral, popliteal and calf veins including the posterior tibial, peroneal and gastrocnemius veins when visible. The superficial great saphenous vein was also interrogated. Spectral Doppler was utilized to evaluate flow at rest and with distal augmentation maneuvers in the common femoral, femoral and popliteal veins. COMPARISON:  Chest XR, 09/11/2016. FINDINGS: RIGHT LOWER EXTREMITY VENOUS Normal compressibility of the RIGHT common femoral, superficial femoral, and popliteal veins, as well as the visualized calf veins. Visualized portions of profunda femoral vein and great saphenous vein unremarkable. No filling defects to suggest DVT on grayscale or color Doppler imaging. Doppler waveforms show normal direction of venous flow, normal respiratory plasticity and response to augmentation. OTHER No evidence of superficial thrombophlebitis or abnormal fluid collection. Limitations: none LEFT LOWER  EXTREMITY VENOUS Normal compressibility of the LEFT common femoral, superficial femoral, and popliteal veins, as well as the visualized calf veins. Visualized portions of profunda femoral vein and great saphenous vein unremarkable. No filling defects to suggest DVT on grayscale or color Doppler imaging. Doppler waveforms show normal direction of venous flow, normal respiratory plasticity and response to augmentation. OTHER No evidence of superficial thrombophlebitis or abnormal fluid collection. Limitations: none IMPRESSION: No evidence of femoropopliteal DVT or superficial thrombophlebitis within either lower extremity. JMichaelle Birks MD [facility] Electronically Signed   By: JMichaelle BirksM.D.   On: 03/17/2022 17:12     Subjective: - no chest pain, shortness of breath, no abdominal pain, nausea or vomiting.   Discharge Exam: BP (!) 142/58 (BP Location: Right Arm)   Pulse 63   Temp 98 F (36.7 C) (Oral)   Resp 17   Ht '5\' 3"'$  (1.6 m)   Wt 68.9 kg  SpO2 99%   BMI 26.91 kg/m   General: Pt is alert, awake, not in acute distress Cardiovascular: RRR, S1/S2 +, no rubs, no gallops Respiratory: CTA bilaterally, no wheezing, no rhonchi Abdominal: Soft, NT, ND, bowel sounds + Extremities: no edema, no cyanosis  The results of significant diagnostics from this hospitalization (including imaging, microbiology, ancillary and laboratory) are listed below for reference.    Microbiology: Recent Results (from the past 240 hour(s))  Blood Culture (routine x 2)     Status: None (Preliminary result)   Collection Time: 03/17/22  4:48 PM   Specimen: BLOOD  Result Value Ref Range Status   Specimen Description   Final    BLOOD RIGHT ANTECUBITAL Performed at [facility], [address], [place], [place] [address]    Special Requests   Final    Blood Culture adequate volume BOTTLES DRAWN AEROBIC AND ANAEROBIC Performed at [facility], [address], [place], [place] [address]    Culture   Final    NO GROWTH 3 DAYS Performed at [facility], [place] [address] [place], [place] [address]    Report Status PENDING  Incomplete  Blood Culture (routine x 2)     Status: None (Preliminary result)   Collection Time: 03/17/22  4:48 PM   Specimen: BLOOD  Result Value Ref Range Status   Specimen Description   Final    BLOOD LEFT ANTECUBITAL Performed at [facility], [address] [place], [place] [address]    Special Requests   Final    Blood Culture adequate volume BOTTLES DRAWN AEROBIC AND ANAEROBIC Performed at [facility], [address] [place], [place] [address]    Culture   Final    NO GROWTH 3 DAYS Performed at [facility], [place] [address], [place], [place] [address]    Report Status PENDING  Incomplete  Urine Culture     Status: None   Collection Time: 03/17/22  5:00 PM   Specimen: Urine, Clean Catch  Result Value Ref Range Status   Specimen Description   Final    URINE, CLEAN CATCH Performed at [facility], [address] [place], [place] [address]    Special Requests   Final    NONE Performed at [facility], [address] [place], [place] [address]    Culture   Final    NO GROWTH Performed at [facility], [place] [address] [place],  [address]    Report Status 03/19/2022 FINAL  Final  Resp Panel by RT-PCR (Flu A&B, Covid) Urine, Clean Catch     Status: None   Collection Time: 03/17/22  5:00 PM   Specimen: Urine, Clean Catch; Nasal Swab  Result Value Ref Range Status   SARS Coronavirus 2 by RT PCR NEGATIVE NEGATIVE Final    Comment: (NOTE) SARS-CoV-2 target nucleic acids are NOT DETECTED.  The SARS-CoV-2 RNA is generally detectable in upper respiratory specimens during the acute phase of infection. The lowest concentration of SARS-CoV-2 viral copies this assay  can detect is 138 copies/mL. A negative result does not preclude SARS-Cov-2 infection and should not be used as the sole basis for treatment or other patient management decisions. A negative result may occur with  improper specimen collection/handling, submission of specimen other than nasopharyngeal swab, presence of viral mutation(s) within the areas targeted by this assay, and inadequate number of viral copies(<138 copies/mL). A negative result must be combined with clinical observations, patient  history, and epidemiological information. The expected result is Negative.  Fact Sheet for Patients:  EntrepreneurPulse.com.au  Fact Sheet for Healthcare Providers:  IncredibleEmployment.be  This test is no t yet approved or cleared by the [place] FDA and  has been authorized for detection and/or diagnosis of SARS-CoV-2 by FDA under an Emergency Use Authorization (EUA). This EUA will remain  in effect (meaning this test can be used) for the duration of the COVID-19 declaration under Section 564(b)(1) of the Act, 21 U.S.C.section 360bbb-3(b)(1), unless the authorization is terminated  or revoked sooner.       Influenza A by PCR NEGATIVE NEGATIVE Final   Influenza B by PCR NEGATIVE NEGATIVE Final    Comment: (NOTE) The Xpert Xpress SARS-CoV-2/FLU/RSV plus assay is intended as an aid in the diagnosis of influenza from Nasopharyngeal swab specimens and should not be used as a sole basis for treatment. Nasal washings and aspirates are unacceptable for Xpert Xpress SARS-CoV-2/FLU/RSV testing.  Fact Sheet for Patients: EntrepreneurPulse.com.au  Fact Sheet for Healthcare Providers: IncredibleEmployment.be  This test is not yet approved or cleared by the [place] FDA and has been authorized for detection and/or diagnosis of SARS-CoV-2 by FDA under an Emergency Use Authorization (EUA). This EUA will  remain in effect (meaning this test can be used) for the duration of the COVID-19 declaration under Section 564(b)(1) of the Act, 21 U.S.C. section 360bbb-3(b)(1), unless the authorization is terminated or revoked.  Performed at [facility], [address], [place], [place] [address]      Labs: Basic Metabolic Panel: Recent Labs  Lab 03/17/22 1648 03/18/22 0108 03/19/22 0516 03/20/22 0527  NA 131* 133* 136 133*  K 4.5 4.1 3.6 3.4*  CL 100 103 104 99  CO2 '23 22 26 25  '$ GLUCOSE 110* 134* 100* 109*  BUN '17 15 14 18  '$ CREATININE 0.90 0.89 0.96 0.92  CALCIUM 9.1 8.5* 8.6* 8.8*  MG  --  1.8  --  1.8  PHOS  --  4.2  --   --    Liver Function Tests: Recent Labs  Lab 03/17/22 1648 03/18/22 0108  AST 15 18  ALT 9 11  ALKPHOS 73 62  BILITOT 0.6 0.8  PROT 6.6 6.0*  ALBUMIN 3.8 3.2*   CBC: Recent Labs  Lab 03/17/22 1648 03/18/22 0108 03/18/22 1319 03/19/22 1023 03/20/22 0527  WBC 6.0 8.7 6.7 7.0 8.7  NEUTROABS 3.5 6.0  --   --   --   HGB 7.0* 7.0* 7.8* 8.8* 8.2*  HCT 22.8* 22.3* 24.2* 27.2* 25.8*  MCV 91.2 90.3 90.0 90.1 89.9  PLT 250 257 217 230 236   CBG: Recent Labs  Lab 03/19/22 1155 03/19/22 1617 03/19/22 2150 03/20/22 0750 03/20/22 1125  GLUCAP 135* 133* 195* 115* 191*   Hgb A1c Recent Labs    03/18/22 0108  HGBA1C 5.7*   Lipid Profile No results for input(s): "CHOL", "HDL", "LDLCALC", "TRIG", "CHOLHDL", "LDLDIRECT" in the last 72 hours. Thyroid function studies No results for input(s): "TSH", "T4TOTAL", "T3FREE", "THYROIDAB" in the last 72 hours.  Invalid input(s): "FREET3" Urinalysis    Component Value Date/Time   COLORURINE YELLOW 03/17/2022 1700   APPEARANCEUR CLEAR 03/17/2022 1700   LABSPEC 1.008 03/17/2022 1700   PHURINE 7.0 03/17/2022 1700   GLUCOSEU NEGATIVE 03/17/2022 1700   HGBUR NEGATIVE 03/17/2022 1700   BILIRUBINUR NEGATIVE 03/17/2022 1700   KETONESUR NEGATIVE 03/17/2022 1700   PROTEINUR NEGATIVE  03/17/2022 1700   UROBILINOGEN 0.2 05/23/2014 2023  NITRITE NEGATIVE 03/17/2022 1700   LEUKOCYTESUR NEGATIVE 03/17/2022 1700    FURTHER DISCHARGE INSTRUCTIONS:   Get Medicines reviewed and adjusted: Please take all your medications with you for your next visit with your Primary MD   Laboratory/radiological data: Please request your Primary MD to go over all hospital tests and procedure/radiological results at the follow up, please ask your Primary MD to get all Hospital records sent to his/her office.   In some cases, they will be blood work, cultures and biopsy results pending at the time of your discharge. Please request that your primary care M.D. goes through all the records of your hospital data and follows up on these results.   Also Note the following: If you experience worsening of your admission symptoms, develop shortness of breath, life threatening emergency, suicidal or homicidal thoughts you must seek medical attention immediately by calling 911 or calling your MD immediately  if symptoms less severe.   You must read complete instructions/literature along with all the possible adverse reactions/side effects for all the Medicines you take and that have been prescribed to you. Take any new Medicines after you have completely understood and accpet all the possible adverse reactions/side effects.    Do not drive when taking Pain medications or sleeping medications (Benzodaizepines)   Do not take more than prescribed Pain, Sleep and Anxiety Medications. It is not advisable to combine anxiety,sleep and pain medications without talking with your primary care practitioner   Special Instructions: If you have smoked or chewed Tobacco  in the last 2 yrs please stop smoking, stop any regular Alcohol  and or any Recreational drug use.   Wear Seat belts while driving.   Please note: You were cared for by a hospitalist during your hospital stay. Once you are discharged, your primary care  physician will handle any further medical issues. Please note that NO REFILLS for any discharge medications will be authorized once you are discharged, as it is imperative that you return to your primary care physician (or establish a relationship with a primary care physician if you do not have one) for your post hospital discharge needs so that they can reassess your need for medications and monitor your lab values.  Time coordinating discharge: 40 minutes  SIGNED:  Marzetta Board, MD, PhD 03/20/2022, 3:34 PM

## 2022-03-20 NOTE — TOC Transition Note (Signed)
Transition of Care [facility]) - CM/SW Discharge Note   Patient Details  Name: Amanda [name] MRN: [id number] Date of Birth: 12/08/1918  Transition of Care [facility]) CM/SW Contact:  Vassie Moselle, [place] Phone Number: 03/20/2022, 3:35 PM   Clinical Narrative:    Pt is to return home with home health services. Pt will be receiving HHPT/OT through [facility]. No DME needs identified.    Final next level of care: [place] Barriers to Discharge: Continued Medical Work up   Patient Goals and CMS Choice Patient states their goals for this hospitalization and ongoing recovery are:: For pt to return home per daughter   Choice offered to / list presented to : Adult Children, [place] / Guardian  Discharge Placement                       Discharge Plan and Services In-house Referral: NA Discharge Planning Services: CM Consult Post Acute Care Choice: Home Health          DME Arranged: N/A DME Agency: NA                  Social Determinants of Health (SDOH) Interventions     Readmission Risk Interventions     No data to display

## 2022-03-21 DIAGNOSIS — D62 Acute posthemorrhagic anemia: Secondary | ICD-10-CM | POA: Diagnosis not present

## 2022-03-21 DIAGNOSIS — Z8673 Personal history of transient ischemic attack (TIA), and cerebral infarction without residual deficits: Secondary | ICD-10-CM | POA: Diagnosis not present

## 2022-03-21 DIAGNOSIS — Z7984 Long term (current) use of oral hypoglycemic drugs: Secondary | ICD-10-CM | POA: Diagnosis not present

## 2022-03-21 DIAGNOSIS — J811 Chronic pulmonary edema: Secondary | ICD-10-CM | POA: Diagnosis not present

## 2022-03-21 DIAGNOSIS — N1832 Chronic kidney disease, stage 3b: Secondary | ICD-10-CM | POA: Diagnosis not present

## 2022-03-21 DIAGNOSIS — G5600 Carpal tunnel syndrome, unspecified upper limb: Secondary | ICD-10-CM | POA: Diagnosis not present

## 2022-03-21 DIAGNOSIS — D509 Iron deficiency anemia, unspecified: Secondary | ICD-10-CM | POA: Diagnosis not present

## 2022-03-21 DIAGNOSIS — I4821 Permanent atrial fibrillation: Secondary | ICD-10-CM | POA: Diagnosis not present

## 2022-03-21 DIAGNOSIS — G2581 Restless legs syndrome: Secondary | ICD-10-CM | POA: Diagnosis not present

## 2022-03-21 DIAGNOSIS — I110 Hypertensive heart disease with heart failure: Secondary | ICD-10-CM | POA: Diagnosis not present

## 2022-03-21 DIAGNOSIS — I5033 Acute on chronic diastolic (congestive) heart failure: Secondary | ICD-10-CM | POA: Diagnosis not present

## 2022-03-21 DIAGNOSIS — E039 Hypothyroidism, unspecified: Secondary | ICD-10-CM | POA: Diagnosis not present

## 2022-03-21 DIAGNOSIS — Z9181 History of falling: Secondary | ICD-10-CM | POA: Diagnosis not present

## 2022-03-21 DIAGNOSIS — Z7901 Long term (current) use of anticoagulants: Secondary | ICD-10-CM | POA: Diagnosis not present

## 2022-03-21 DIAGNOSIS — E1122 Type 2 diabetes mellitus with diabetic chronic kidney disease: Secondary | ICD-10-CM | POA: Diagnosis not present

## 2022-03-22 LAB — CULTURE, BLOOD (ROUTINE X 2)
Culture: NO GROWTH
Culture: NO GROWTH
Special Requests: ADEQUATE
Special Requests: ADEQUATE

## 2022-03-23 DIAGNOSIS — I5033 Acute on chronic diastolic (congestive) heart failure: Secondary | ICD-10-CM | POA: Diagnosis not present

## 2022-03-23 DIAGNOSIS — D509 Iron deficiency anemia, unspecified: Secondary | ICD-10-CM | POA: Diagnosis not present

## 2022-03-23 DIAGNOSIS — I4821 Permanent atrial fibrillation: Secondary | ICD-10-CM | POA: Diagnosis not present

## 2022-03-23 DIAGNOSIS — N1832 Chronic kidney disease, stage 3b: Secondary | ICD-10-CM | POA: Diagnosis not present

## 2022-03-23 DIAGNOSIS — E1122 Type 2 diabetes mellitus with diabetic chronic kidney disease: Secondary | ICD-10-CM | POA: Diagnosis not present

## 2022-03-23 DIAGNOSIS — I110 Hypertensive heart disease with heart failure: Secondary | ICD-10-CM | POA: Diagnosis not present

## 2022-03-26 DIAGNOSIS — D509 Iron deficiency anemia, unspecified: Secondary | ICD-10-CM | POA: Diagnosis not present

## 2022-03-26 DIAGNOSIS — D6869 Other thrombophilia: Secondary | ICD-10-CM | POA: Diagnosis not present

## 2022-03-26 DIAGNOSIS — I10 Essential (primary) hypertension: Secondary | ICD-10-CM | POA: Diagnosis not present

## 2022-03-26 DIAGNOSIS — I5032 Chronic diastolic (congestive) heart failure: Secondary | ICD-10-CM | POA: Diagnosis not present

## 2022-03-26 DIAGNOSIS — I4819 Other persistent atrial fibrillation: Secondary | ICD-10-CM | POA: Diagnosis not present

## 2022-05-07 ENCOUNTER — Encounter (INDEPENDENT_AMBULATORY_CARE_PROVIDER_SITE_OTHER): Payer: Medicare Other | Admitting: Ophthalmology

## 2022-05-07 DIAGNOSIS — G4700 Insomnia, unspecified: Secondary | ICD-10-CM | POA: Diagnosis not present

## 2022-05-07 DIAGNOSIS — E113392 Type 2 diabetes mellitus with moderate nonproliferative diabetic retinopathy without macular edema, left eye: Secondary | ICD-10-CM | POA: Diagnosis not present

## 2022-05-07 DIAGNOSIS — E039 Hypothyroidism, unspecified: Secondary | ICD-10-CM | POA: Diagnosis not present

## 2022-05-07 DIAGNOSIS — H4423 Degenerative myopia, bilateral: Secondary | ICD-10-CM | POA: Diagnosis not present

## 2022-05-07 DIAGNOSIS — K589 Irritable bowel syndrome without diarrhea: Secondary | ICD-10-CM | POA: Diagnosis not present

## 2022-05-07 DIAGNOSIS — D6869 Other thrombophilia: Secondary | ICD-10-CM | POA: Diagnosis not present

## 2022-05-07 DIAGNOSIS — D509 Iron deficiency anemia, unspecified: Secondary | ICD-10-CM | POA: Diagnosis not present

## 2022-05-07 DIAGNOSIS — H35033 Hypertensive retinopathy, bilateral: Secondary | ICD-10-CM

## 2022-05-07 DIAGNOSIS — E782 Mixed hyperlipidemia: Secondary | ICD-10-CM | POA: Diagnosis not present

## 2022-05-07 DIAGNOSIS — Z0000 Encounter for general adult medical examination without abnormal findings: Secondary | ICD-10-CM | POA: Diagnosis not present

## 2022-05-07 DIAGNOSIS — H35343 Macular cyst, hole, or pseudohole, bilateral: Secondary | ICD-10-CM

## 2022-05-07 DIAGNOSIS — I10 Essential (primary) hypertension: Secondary | ICD-10-CM

## 2022-05-07 DIAGNOSIS — I5032 Chronic diastolic (congestive) heart failure: Secondary | ICD-10-CM | POA: Diagnosis not present

## 2022-05-07 DIAGNOSIS — H43813 Vitreous degeneration, bilateral: Secondary | ICD-10-CM | POA: Diagnosis not present

## 2022-05-07 DIAGNOSIS — E11319 Type 2 diabetes mellitus with unspecified diabetic retinopathy without macular edema: Secondary | ICD-10-CM | POA: Diagnosis not present

## 2022-05-07 DIAGNOSIS — I4819 Other persistent atrial fibrillation: Secondary | ICD-10-CM | POA: Diagnosis not present

## 2022-05-07 DIAGNOSIS — F411 Generalized anxiety disorder: Secondary | ICD-10-CM | POA: Diagnosis not present

## 2022-05-14 DIAGNOSIS — H6123 Impacted cerumen, bilateral: Secondary | ICD-10-CM | POA: Diagnosis not present

## 2022-05-14 DIAGNOSIS — H903 Sensorineural hearing loss, bilateral: Secondary | ICD-10-CM | POA: Diagnosis not present

## 2022-05-14 DIAGNOSIS — S00412A Abrasion of left ear, initial encounter: Secondary | ICD-10-CM | POA: Diagnosis not present

## 2022-09-03 DIAGNOSIS — R300 Dysuria: Secondary | ICD-10-CM | POA: Diagnosis not present

## 2022-09-03 DIAGNOSIS — I4891 Unspecified atrial fibrillation: Secondary | ICD-10-CM | POA: Diagnosis not present

## 2022-09-03 DIAGNOSIS — E039 Hypothyroidism, unspecified: Secondary | ICD-10-CM | POA: Diagnosis not present

## 2022-09-03 DIAGNOSIS — I10 Essential (primary) hypertension: Secondary | ICD-10-CM | POA: Diagnosis not present

## 2022-09-03 DIAGNOSIS — D6869 Other thrombophilia: Secondary | ICD-10-CM | POA: Diagnosis not present

## 2022-09-03 DIAGNOSIS — E782 Mixed hyperlipidemia: Secondary | ICD-10-CM | POA: Diagnosis not present

## 2022-09-03 DIAGNOSIS — E11319 Type 2 diabetes mellitus with unspecified diabetic retinopathy without macular edema: Secondary | ICD-10-CM | POA: Diagnosis not present

## 2022-09-03 LAB — LAB REPORT - SCANNED
A1c: 6.4
EGFR: 53

## 2022-09-29 ENCOUNTER — Emergency Department (HOSPITAL_COMMUNITY): Payer: Medicare Other

## 2022-09-29 ENCOUNTER — Inpatient Hospital Stay (HOSPITAL_COMMUNITY): Payer: Medicare Other | Admitting: Anesthesiology

## 2022-09-29 ENCOUNTER — Encounter (HOSPITAL_COMMUNITY): Payer: Self-pay

## 2022-09-29 ENCOUNTER — Other Ambulatory Visit: Payer: Self-pay

## 2022-09-29 ENCOUNTER — Inpatient Hospital Stay (HOSPITAL_COMMUNITY)
Admission: EM | Admit: 2022-09-29 | Discharge: 2022-10-06 | DRG: 480 | Disposition: A | Discharge status: Rehabilitation Facility | Payer: Medicare Other | Attending: Internal Medicine | Admitting: Internal Medicine

## 2022-09-29 DIAGNOSIS — I4891 Unspecified atrial fibrillation: Secondary | ICD-10-CM | POA: Diagnosis not present

## 2022-09-29 DIAGNOSIS — Z91013 Allergy to seafood: Secondary | ICD-10-CM | POA: Diagnosis not present

## 2022-09-29 DIAGNOSIS — E875 Hyperkalemia: Secondary | ICD-10-CM | POA: Diagnosis not present

## 2022-09-29 DIAGNOSIS — Z66 Do not resuscitate: Secondary | ICD-10-CM | POA: Diagnosis present

## 2022-09-29 DIAGNOSIS — R471 Dysarthria and anarthria: Secondary | ICD-10-CM

## 2022-09-29 DIAGNOSIS — R0902 Hypoxemia: Secondary | ICD-10-CM | POA: Diagnosis not present

## 2022-09-29 DIAGNOSIS — R609 Edema, unspecified: Secondary | ICD-10-CM | POA: Diagnosis not present

## 2022-09-29 DIAGNOSIS — W109XXD Fall (on) (from) unspecified stairs and steps, subsequent encounter: Secondary | ICD-10-CM | POA: Diagnosis present

## 2022-09-29 DIAGNOSIS — I6381 Other cerebral infarction due to occlusion or stenosis of small artery: Secondary | ICD-10-CM | POA: Diagnosis not present

## 2022-09-29 DIAGNOSIS — Z885 Allergy status to narcotic agent status: Secondary | ICD-10-CM | POA: Diagnosis not present

## 2022-09-29 DIAGNOSIS — F419 Anxiety disorder, unspecified: Secondary | ICD-10-CM | POA: Diagnosis present

## 2022-09-29 DIAGNOSIS — F329 Major depressive disorder, single episode, unspecified: Secondary | ICD-10-CM | POA: Diagnosis not present

## 2022-09-29 DIAGNOSIS — W109XXA Fall (on) (from) unspecified stairs and steps, initial encounter: Secondary | ICD-10-CM | POA: Diagnosis present

## 2022-09-29 DIAGNOSIS — S0990XA Unspecified injury of head, initial encounter: Secondary | ICD-10-CM | POA: Diagnosis not present

## 2022-09-29 DIAGNOSIS — I4821 Permanent atrial fibrillation: Secondary | ICD-10-CM | POA: Diagnosis present

## 2022-09-29 DIAGNOSIS — Z8249 Family history of ischemic heart disease and other diseases of the circulatory system: Secondary | ICD-10-CM

## 2022-09-29 DIAGNOSIS — I6389 Other cerebral infarction: Secondary | ICD-10-CM | POA: Diagnosis not present

## 2022-09-29 DIAGNOSIS — S72142A Displaced intertrochanteric fracture of left femur, initial encounter for closed fracture: Secondary | ICD-10-CM | POA: Diagnosis not present

## 2022-09-29 DIAGNOSIS — R29810 Facial weakness: Secondary | ICD-10-CM | POA: Diagnosis not present

## 2022-09-29 DIAGNOSIS — Z043 Encounter for examination and observation following other accident: Secondary | ICD-10-CM | POA: Diagnosis not present

## 2022-09-29 DIAGNOSIS — Z515 Encounter for palliative care: Secondary | ICD-10-CM | POA: Diagnosis not present

## 2022-09-29 DIAGNOSIS — R110 Nausea: Secondary | ICD-10-CM | POA: Diagnosis not present

## 2022-09-29 DIAGNOSIS — I10 Essential (primary) hypertension: Secondary | ICD-10-CM | POA: Diagnosis not present

## 2022-09-29 DIAGNOSIS — Z823 Family history of stroke: Secondary | ICD-10-CM | POA: Diagnosis not present

## 2022-09-29 DIAGNOSIS — I69322 Dysarthria following cerebral infarction: Secondary | ICD-10-CM | POA: Diagnosis not present

## 2022-09-29 DIAGNOSIS — F411 Generalized anxiety disorder: Secondary | ICD-10-CM | POA: Diagnosis not present

## 2022-09-29 DIAGNOSIS — N179 Acute kidney failure, unspecified: Secondary | ICD-10-CM | POA: Diagnosis not present

## 2022-09-29 DIAGNOSIS — R1310 Dysphagia, unspecified: Secondary | ICD-10-CM | POA: Diagnosis not present

## 2022-09-29 DIAGNOSIS — G2581 Restless legs syndrome: Secondary | ICD-10-CM | POA: Diagnosis not present

## 2022-09-29 DIAGNOSIS — I6340 Cerebral infarction due to embolism of unspecified cerebral artery: Secondary | ICD-10-CM

## 2022-09-29 DIAGNOSIS — E119 Type 2 diabetes mellitus without complications: Secondary | ICD-10-CM

## 2022-09-29 DIAGNOSIS — I129 Hypertensive chronic kidney disease with stage 1 through stage 4 chronic kidney disease, or unspecified chronic kidney disease: Secondary | ICD-10-CM | POA: Diagnosis not present

## 2022-09-29 DIAGNOSIS — E1122 Type 2 diabetes mellitus with diabetic chronic kidney disease: Secondary | ICD-10-CM | POA: Diagnosis not present

## 2022-09-29 DIAGNOSIS — S7222XA Displaced subtrochanteric fracture of left femur, initial encounter for closed fracture: Secondary | ICD-10-CM | POA: Diagnosis not present

## 2022-09-29 DIAGNOSIS — S72002A Fracture of unspecified part of neck of left femur, initial encounter for closed fracture: Secondary | ICD-10-CM

## 2022-09-29 DIAGNOSIS — Y92009 Unspecified place in unspecified non-institutional (private) residence as the place of occurrence of the external cause: Secondary | ICD-10-CM

## 2022-09-29 DIAGNOSIS — I639 Cerebral infarction, unspecified: Secondary | ICD-10-CM | POA: Diagnosis not present

## 2022-09-29 DIAGNOSIS — E039 Hypothyroidism, unspecified: Secondary | ICD-10-CM | POA: Diagnosis not present

## 2022-09-29 DIAGNOSIS — Z79899 Other long term (current) drug therapy: Secondary | ICD-10-CM

## 2022-09-29 DIAGNOSIS — Z79890 Hormone replacement therapy (postmenopausal): Secondary | ICD-10-CM | POA: Diagnosis not present

## 2022-09-29 DIAGNOSIS — D638 Anemia in other chronic diseases classified elsewhere: Secondary | ICD-10-CM | POA: Diagnosis not present

## 2022-09-29 DIAGNOSIS — R001 Bradycardia, unspecified: Secondary | ICD-10-CM | POA: Diagnosis not present

## 2022-09-29 DIAGNOSIS — E785 Hyperlipidemia, unspecified: Secondary | ICD-10-CM | POA: Diagnosis present

## 2022-09-29 DIAGNOSIS — Z7984 Long term (current) use of oral hypoglycemic drugs: Secondary | ICD-10-CM

## 2022-09-29 DIAGNOSIS — D62 Acute posthemorrhagic anemia: Secondary | ICD-10-CM | POA: Diagnosis not present

## 2022-09-29 DIAGNOSIS — W19XXXA Unspecified fall, initial encounter: Secondary | ICD-10-CM

## 2022-09-29 DIAGNOSIS — S8252XA Displaced fracture of medial malleolus of left tibia, initial encounter for closed fracture: Secondary | ICD-10-CM | POA: Diagnosis present

## 2022-09-29 DIAGNOSIS — S72102A Unspecified trochanteric fracture of left femur, initial encounter for closed fracture: Secondary | ICD-10-CM | POA: Diagnosis not present

## 2022-09-29 DIAGNOSIS — I63413 Cerebral infarction due to embolism of bilateral middle cerebral arteries: Secondary | ICD-10-CM | POA: Diagnosis not present

## 2022-09-29 DIAGNOSIS — Z882 Allergy status to sulfonamides status: Secondary | ICD-10-CM | POA: Diagnosis not present

## 2022-09-29 DIAGNOSIS — I69392 Facial weakness following cerebral infarction: Secondary | ICD-10-CM | POA: Diagnosis not present

## 2022-09-29 DIAGNOSIS — I69391 Dysphagia following cerebral infarction: Secondary | ICD-10-CM | POA: Diagnosis not present

## 2022-09-29 DIAGNOSIS — Z91048 Other nonmedicinal substance allergy status: Secondary | ICD-10-CM

## 2022-09-29 DIAGNOSIS — Z833 Family history of diabetes mellitus: Secondary | ICD-10-CM | POA: Diagnosis not present

## 2022-09-29 DIAGNOSIS — S72142D Displaced intertrochanteric fracture of left femur, subsequent encounter for closed fracture with routine healing: Secondary | ICD-10-CM | POA: Diagnosis not present

## 2022-09-29 DIAGNOSIS — Z888 Allergy status to other drugs, medicaments and biological substances status: Secondary | ICD-10-CM

## 2022-09-29 DIAGNOSIS — S72009A Fracture of unspecified part of neck of unspecified femur, initial encounter for closed fracture: Secondary | ICD-10-CM | POA: Diagnosis present

## 2022-09-29 DIAGNOSIS — Z7901 Long term (current) use of anticoagulants: Secondary | ICD-10-CM | POA: Diagnosis not present

## 2022-09-29 DIAGNOSIS — I482 Chronic atrial fibrillation, unspecified: Secondary | ICD-10-CM | POA: Diagnosis not present

## 2022-09-29 DIAGNOSIS — Z602 Problems related to living alone: Secondary | ICD-10-CM | POA: Diagnosis present

## 2022-09-29 DIAGNOSIS — N1832 Chronic kidney disease, stage 3b: Secondary | ICD-10-CM | POA: Diagnosis present

## 2022-09-29 DIAGNOSIS — Z8673 Personal history of transient ischemic attack (TIA), and cerebral infarction without residual deficits: Secondary | ICD-10-CM

## 2022-09-29 LAB — BASIC METABOLIC PANEL
Anion gap: 10 (ref 5–15)
BUN: 20 mg/dL (ref 8–23)
CO2: 22 mmol/L (ref 22–32)
Calcium: 8.7 mg/dL — ABNORMAL LOW (ref 8.9–10.3)
Chloride: 100 mmol/L (ref 98–111)
Creatinine, Ser: 0.92 mg/dL (ref 0.44–1.00)
GFR, Estimated: 55 mL/min — ABNORMAL LOW (ref >60)
Glucose, Bld: 246 mg/dL — ABNORMAL HIGH (ref 70–99)
Potassium: 4.7 mmol/L (ref 3.5–5.1)
Sodium: 132 mmol/L — ABNORMAL LOW (ref 135–145)

## 2022-09-29 LAB — CBC WITH DIFFERENTIAL/PLATELET
Abs Immature Granulocytes: 0.03 10*3/uL (ref 0.00–0.07)
Basophils Absolute: 0.1 10*3/uL (ref 0.0–0.1)
Basophils Relative: 1 %
Eosinophils Absolute: 0.1 10*3/uL (ref 0.0–0.5)
Eosinophils Relative: 1 %
HCT: 35.7 % — ABNORMAL LOW (ref 36.0–46.0)
Hemoglobin: 11.2 g/dL — ABNORMAL LOW (ref 12.0–15.0)
Immature Granulocytes: 0 %
Lymphocytes Relative: 14 %
Lymphs Abs: 1.3 10*3/uL (ref 0.7–4.0)
MCH: 32.4 pg (ref 26.0–34.0)
MCHC: 31.4 g/dL (ref 30.0–36.0)
MCV: 103.2 fL — ABNORMAL HIGH (ref 80.0–100.0)
Monocytes Absolute: 0.7 10*3/uL (ref 0.1–1.0)
Monocytes Relative: 8 %
Neutro Abs: 7 10*3/uL (ref 1.7–7.7)
Neutrophils Relative %: 76 %
Platelets: 190 10*3/uL (ref 150–400)
RBC: 3.46 MIL/uL — ABNORMAL LOW (ref 3.87–5.11)
RDW: 14.1 % (ref 11.5–15.5)
WBC: 9.2 10*3/uL (ref 4.0–10.5)
nRBC: 0 % (ref 0.0–0.2)

## 2022-09-29 LAB — PROTIME-INR
INR: 1.4 — ABNORMAL HIGH (ref 0.8–1.2)
Prothrombin Time: 17.2 seconds — ABNORMAL HIGH (ref 11.4–15.2)

## 2022-09-29 LAB — GLUCOSE, CAPILLARY: Glucose-Capillary: 174 mg/dL — ABNORMAL HIGH (ref 70–99)

## 2022-09-29 MED ORDER — FENTANYL CITRATE (PF) 100 MCG/2ML IJ SOLN
INTRAMUSCULAR | Status: AC
  Administered 2022-09-29: 25 ug
  Filled 2022-09-29: qty 2

## 2022-09-29 MED ORDER — FENTANYL CITRATE PF 50 MCG/ML IJ SOSY
25.0000 ug | PREFILLED_SYRINGE | Freq: Once | INTRAMUSCULAR | Status: AC
  Administered 2022-09-29: 25 ug via INTRAVENOUS

## 2022-09-29 MED ORDER — ROPINIROLE HCL 0.5 MG PO TABS
0.2500 mg | ORAL_TABLET | Freq: Every day | ORAL | Status: DC
  Administered 2022-09-29 – 2022-10-05 (×7): 0.25 mg via ORAL
  Filled 2022-09-29 (×8): qty 1

## 2022-09-29 MED ORDER — LISINOPRIL 20 MG PO TABS
20.0000 mg | ORAL_TABLET | Freq: Every day | ORAL | Status: DC
  Administered 2022-09-30: 20 mg via ORAL
  Filled 2022-09-29: qty 1

## 2022-09-29 MED ORDER — BUPIVACAINE-EPINEPHRINE (PF) 0.5% -1:200000 IJ SOLN
INTRAMUSCULAR | Status: DC | PRN
  Administered 2022-09-29: 25 mL via PERINEURAL

## 2022-09-29 MED ORDER — DOCUSATE SODIUM 100 MG PO CAPS
100.0000 mg | ORAL_CAPSULE | Freq: Two times a day (BID) | ORAL | Status: DC
  Administered 2022-09-29 – 2022-09-30 (×3): 100 mg via ORAL
  Filled 2022-09-29 (×3): qty 1

## 2022-09-29 MED ORDER — MORPHINE SULFATE (PF) 2 MG/ML IV SOLN
2.0000 mg | INTRAVENOUS | Status: DC | PRN
  Administered 2022-09-29 – 2022-10-01 (×6): 2 mg via INTRAVENOUS
  Filled 2022-09-29 (×6): qty 1

## 2022-09-29 MED ORDER — FENTANYL CITRATE PF 50 MCG/ML IJ SOSY
25.0000 ug | PREFILLED_SYRINGE | INTRAMUSCULAR | Status: DC | PRN
  Administered 2022-09-29: 25 ug via INTRAVENOUS
  Filled 2022-09-29 (×2): qty 1

## 2022-09-29 MED ORDER — HYDRALAZINE HCL 20 MG/ML IJ SOLN
5.0000 mg | INTRAMUSCULAR | Status: DC | PRN
  Administered 2022-09-29: 5 mg via INTRAVENOUS
  Filled 2022-09-29: qty 1

## 2022-09-29 MED ORDER — LORATADINE 10 MG PO TABS
10.0000 mg | ORAL_TABLET | Freq: Every day | ORAL | Status: DC
  Administered 2022-09-30 – 2022-10-06 (×6): 10 mg via ORAL
  Filled 2022-09-29 (×6): qty 1

## 2022-09-29 MED ORDER — DEXAMETHASONE SODIUM PHOSPHATE 4 MG/ML IJ SOLN
INTRAMUSCULAR | Status: DC | PRN
  Administered 2022-09-29: 10 mg via PERINEURAL

## 2022-09-29 MED ORDER — INSULIN ASPART 100 UNIT/ML IJ SOLN
0.0000 [IU] | Freq: Three times a day (TID) | INTRAMUSCULAR | Status: DC
  Administered 2022-09-29 – 2022-09-30 (×2): 2 [IU] via SUBCUTANEOUS
  Administered 2022-09-30 (×2): 3 [IU] via SUBCUTANEOUS
  Administered 2022-10-01 (×2): 1 [IU] via SUBCUTANEOUS
  Administered 2022-10-02: 2 [IU] via SUBCUTANEOUS
  Administered 2022-10-02: 3 [IU] via SUBCUTANEOUS
  Administered 2022-10-03: 2 [IU] via SUBCUTANEOUS
  Administered 2022-10-03: 3 [IU] via SUBCUTANEOUS
  Administered 2022-10-04 – 2022-10-05 (×4): 2 [IU] via SUBCUTANEOUS
  Administered 2022-10-05: 1 [IU] via SUBCUTANEOUS
  Administered 2022-10-06 (×2): 2 [IU] via SUBCUTANEOUS

## 2022-09-29 MED ORDER — METHOCARBAMOL 1000 MG/10ML IJ SOLN
500.0000 mg | Freq: Four times a day (QID) | INTRAVENOUS | Status: DC | PRN
  Filled 2022-09-29: qty 5

## 2022-09-29 MED ORDER — POLYETHYLENE GLYCOL 3350 17 G PO PACK: 17.0000 g | PACK | Freq: Every day | ORAL | Status: DC | PRN

## 2022-09-29 MED ORDER — BISACODYL 5 MG PO TBEC: 5.0000 mg | DELAYED_RELEASE_TABLET | Freq: Every day | ORAL | Status: DC | PRN

## 2022-09-29 MED ORDER — LEVOTHYROXINE SODIUM 88 MCG PO TABS
88.0000 ug | ORAL_TABLET | Freq: Every day | ORAL | Status: DC
  Administered 2022-09-30 – 2022-10-06 (×5): 88 ug via ORAL
  Filled 2022-09-29 (×5): qty 1

## 2022-09-29 MED ORDER — OXYCODONE HCL 5 MG PO TABS
5.0000 mg | ORAL_TABLET | ORAL | Status: DC | PRN
  Administered 2022-09-30 (×2): 10 mg via ORAL
  Administered 2022-09-30: 5 mg via ORAL
  Filled 2022-09-29 (×2): qty 2
  Filled 2022-09-29: qty 1

## 2022-09-29 MED ORDER — ONDANSETRON HCL 4 MG/2ML IJ SOLN
4.0000 mg | Freq: Once | INTRAMUSCULAR | Status: DC
  Filled 2022-09-29: qty 2

## 2022-09-29 MED ORDER — METHOCARBAMOL 500 MG PO TABS
500.0000 mg | ORAL_TABLET | Freq: Four times a day (QID) | ORAL | Status: DC | PRN
  Administered 2022-09-29 – 2022-09-30 (×2): 500 mg via ORAL
  Filled 2022-09-29 (×3): qty 1

## 2022-09-29 MED ORDER — SALINE SPRAY 0.65 % NA SOLN: 1.0000 | Freq: Every day | NASAL | Status: DC | PRN

## 2022-09-29 NOTE — Consult Note (Signed)
Reason for Consult:Left hip fx Referring Physician: Gwyneth Sprout Time called: 1326 Time at bedside: 1340   Amanda [name] is an 89 y.o. female.  HPI: Amanda [name] was walking down some steps and missed the last one and fell. She had immediate left hip pain and could not get up. She was brought to the ED where x-rays showed a hip fx and orthopedic surgery was consulted. She lives at home alone and ambulates with a RW.  Past Medical History:  Diagnosis Date   Carpal tunnel syndrome    Chronic anticoagulation    Chronic atrial fibrillation (HCC)    Diabetes mellitus    NON-INSULIN   Hypothyroidism    Sinus congestion     Past Surgical History:  Procedure Laterality Date   APPENDECTOMY     CARDIOVASCULAR STRESS TEST  07/10/2004   EF 81%   CHOLECYSTECTOMY     US ECHOCARDIOGRAPHY  12/06/2007   EF 55-60%    Family History  Problem Relation Age of Onset   Stroke Mother    Hypertension Mother    Diabetes Sister    Hypertension Other    Heart attack Neg Hx     Social History:  reports that she has never smoked. She has never used smokeless tobacco. She reports that she does not drink alcohol and does not use drugs.  Allergies:  Allergies  Allergen Reactions   Calcium-Vitamin D [Calcium Carb-Ergocalciferol] Other (See Comments)    Constipation    Tape Other (See Comments)    Skin very thin, tears easily   Codeine Nausea Only   Hydrocodone Nausea Only   Metoprolol Nausea Only   Rythmol [Propafenone Hcl] Other (See Comments)    Dizziness    Shellfish Allergy Itching and Rash    Mussels only.   Sulfa Antibiotics Nausea Only    Medications: I have reviewed the patient's current medications.  Results for orders placed or performed during the hospital encounter of 09/29/22 (from the past 48 hour(s))  Basic metabolic panel     Status: Abnormal   Collection Time: 09/29/22 11:35 AM  Result Value Ref Range   Sodium 132 (L) 135 - 145 mmol/L   Potassium 4.7 3.5 - 5.1  mmol/L   Chloride 100 98 - 111 mmol/L   CO2 22 22 - 32 mmol/L   Glucose, Bld 246 (H) 70 - 99 mg/dL    Comment: Glucose reference range applies only to samples taken after fasting for at least 8 hours.   BUN 20 8 - 23 mg/dL   Creatinine, Ser 1.61 0.44 - 1.00 mg/dL   Calcium 8.7 (L) 8.9 - 10.3 mg/dL   GFR, Estimated 55 (L) >60 mL/min    Comment: (NOTE) Calculated using the CKD-EPI Creatinine Equation (2021)    Anion gap 10 5 - 15    Comment: Performed at [facility], [address] [place], [place] [address]  CBC with Differential     Status: Abnormal   Collection Time: 09/29/22 11:35 AM  Result Value Ref Range   WBC 9.2 4.0 - 10.5 K/uL   RBC 3.46 (L) 3.87 - 5.11 MIL/uL   Hemoglobin 11.2 (L) 12.0 - 15.0 g/dL   HCT 54.0 (L) 98.1 - 19.1 %   MCV 103.2 (H) 80.0 - 100.0 fL   MCH 32.4 26.0 - 34.0 pg   MCHC 31.4 30.0 - 36.0 g/dL   RDW 47.8 29.5 - 62.1 %   Platelets 190 150 - 400 K/uL   nRBC 0.0  0.0 - 0.2 %   Neutrophils Relative % 76 %   Neutro Abs 7.0 1.7 - 7.7 K/uL   Lymphocytes Relative 14 %   Lymphs Abs 1.3 0.7 - 4.0 K/uL   Monocytes Relative 8 %   Monocytes Absolute 0.7 0.1 - 1.0 K/uL   Eosinophils Relative 1 %   Eosinophils Absolute 0.1 0.0 - 0.5 K/uL   Basophils Relative 1 %   Basophils Absolute 0.1 0.0 - 0.1 K/uL   Immature Granulocytes 0 %   Abs Immature Granulocytes 0.03 0.00 - 0.07 K/uL    Comment: Performed at [facility], [address] [place], [place] [address]  Protime-INR     Status: Abnormal   Collection Time: 09/29/22 11:35 AM  Result Value Ref Range   Prothrombin Time 17.2 (H) 11.4 - 15.2 seconds   INR 1.4 (H) 0.8 - 1.2    Comment: (NOTE) INR goal varies based on device and disease states. Performed at [facility], [address], [place], [place] [address]   Type and screen [facility]     Status: None   Collection Time: 09/29/22 11:35 AM  Result Value Ref Range   ABO/RH(D) O POS    Antibody Screen NEG     Sample Expiration      10/02/2022,2359 Performed at [facility], [address] [place], [place] [address]     DG Ankle Complete Right  Result Date: 09/29/2022 CLINICAL DATA:  Tripped and fell going down 1 step landing on right side. EXAM: RIGHT ANKLE - COMPLETE 3+ VIEW COMPARISON:  None Available. FINDINGS: Ankle mortise is normal. Findings suggested a possible fracture of the medial malleolus seen only on the frontal film. Small inferior calcaneal spur. Minimal degenerative change of the midfoot. IMPRESSION: Possible fracture of the medial malleolus seen only on the frontal film. Electronically Signed   By: Elberta Fortis M.D.   On: 09/29/2022 13:15   DG Hip Unilat With Pelvis 2-3 Views Left  Result Date: 09/29/2022 CLINICAL DATA:  Tripped and fell over 1 steps landing on right side. Left lower extremity deformity. EXAM: DG HIP (WITH OR WITHOUT PELVIS) 2-3V LEFT COMPARISON:  None Available. FINDINGS: Mild diffuse decreased bone mineralization. Minimal symmetric degenerative changes of the hips. There is a displaced intertrochanteric fracture of the left hip. Minimally displaced lesser trochanteric fragment. Minimal exaggerated coxa vera about the fracture site. Degenerative changes of the spine. IMPRESSION: Displaced intertrochanteric fracture of the left hip. Electronically Signed   By: Elberta Fortis M.D.   On: 09/29/2022 13:12   DG Chest Port 1 View  Result Date: 09/29/2022 CLINICAL DATA:  Tripped and fell over one-step landing on right side. EXAM: PORTABLE CHEST 1 VIEW COMPARISON:  03/20/2022 FINDINGS: Lungs are hyperexpanded and otherwise clear. Mild stable cardiomegaly. No evidence of acute fracture. IMPRESSION: 1. No acute findings. 2. Mild stable cardiomegaly. Electronically Signed   By: Elberta Fortis M.D.   On: 09/29/2022 13:10   CT HEAD WO CONTRAST  Result Date: 09/29/2022 CLINICAL DATA:  Head trauma, minor EXAM: CT HEAD WITHOUT CONTRAST TECHNIQUE: Contiguous axial images were  obtained from the base of the skull through the vertex without intravenous contrast. RADIATION DOSE REDUCTION: This exam was performed according to the departmental dose-optimization program which includes automated exposure control, adjustment of the mA and/or kV according to patient size and/or use of iterative reconstruction technique. COMPARISON:  CT head dated May 16, 2020 FINDINGS: Brain: No evidence of acute infarction,  hemorrhage, hydrocephalus, extra-axial collection or mass lesion/mass effect. Patchy areas of low-attenuation of the periventricular and subcortical white matter presumed moderate chronic microvascular ischemic changes. Mild generalized cerebral atrophy. Vascular: No hyperdense vessel or unexpected calcification. Skull: Normal. Negative for fracture or focal lesion. Sinuses/Orbits: No acute finding. Other: None. IMPRESSION: 1. No acute intracranial abnormality. 2. Moderate chronic microvascular ischemic changes of the white matter. Mild generalized cerebral atrophy. Electronically Signed   By: Larose Hires D.O.   On: 09/29/2022 12:21    Review of Systems  HENT:  Negative for ear discharge, ear pain, hearing loss and tinnitus.   Eyes:  Negative for photophobia and pain.  Respiratory:  Negative for cough and shortness of breath.   Cardiovascular:  Negative for chest pain.  Gastrointestinal:  Negative for abdominal pain, nausea and vomiting.  Genitourinary:  Negative for dysuria, flank pain, frequency and urgency.  Musculoskeletal:  Positive for arthralgias (Left hip). Negative for back pain, myalgias and neck pain.  Neurological:  Negative for dizziness and headaches.  Hematological:  Does not bruise/bleed easily.  Psychiatric/Behavioral:  The patient is not nervous/anxious.    Blood pressure (!) 188/72, pulse 61, temperature 97.6 F (36.4 C), temperature source Axillary, resp. rate 20, height 5\' 3"  (1.6 m), weight 68.9 kg, SpO2 100 %. Physical Exam Constitutional:       General: She is not in acute distress.    Appearance: She is well-developed. She is not diaphoretic.  HENT:     Head: Normocephalic and atraumatic.  Eyes:     General: No scleral icterus.       Right eye: No discharge.        Left eye: No discharge.     Conjunctiva/sclera: Conjunctivae normal.  Cardiovascular:     Rate and Rhythm: Normal rate and regular rhythm.  Pulmonary:     Effort: Pulmonary effort is normal. No respiratory distress.  Musculoskeletal:     Cervical back: Normal range of motion.     Comments: LLE No traumatic wounds, ecchymosis, or rash  Mod TTP hip  No knee or ankle effusion  Knee stable to varus/ valgus and anterior/posterior stress  Sens DPN, SPN, TN intact  Motor EHL, ext, flex, evers 5/5  DP 2+, PT 2+, No significant edema  Skin:    General: Skin is warm and dry.  Neurological:     Mental Status: She is alert.  Psychiatric:        Mood and Affect: Mood normal.        Behavior: Behavior normal.     Assessment/Plan: Left hip fx -- Plan IMN, likely tomorrow with Dr. Roda Shutters. NPO after MN. Multiple medical problems including afib on Eliquis -- per primary service.     Freeman Caldron, PA-C Orthopedic Surgery [phone] 09/29/2022, 1:50 PM

## 2022-09-29 NOTE — Anesthesia Postprocedure Evaluation (Signed)
Anesthesia Post Note  Patient: Renie Ora Hellen  Procedure(s) Performed: AN AD HOC NERVE BLOCK     Patient location during evaluation: PACU Anesthesia Type: Regional Level of consciousness: sedated Pain management: pain level controlled Vital Signs Assessment: post-procedure vital signs reviewed and stable Respiratory status: spontaneous breathing, nonlabored ventilation, respiratory function stable and patient connected to nasal cannula oxygen Cardiovascular status: stable and blood pressure returned to baseline Postop Assessment: no apparent nausea or vomiting Anesthetic complications: no  No notable events documented.  Last Vitals:  Vitals:   09/29/22 2030 09/29/22 2032  BP: (!) 161/61 (!) 133/56  Pulse: 76 86  Resp: 16 18  Temp:    SpO2: 100% 100%    Last Pain:  Vitals:   09/29/22 1724  TempSrc: Axillary  PainSc:                  Dhairya Corales,W. EDMOND

## 2022-09-29 NOTE — ED Provider Notes (Signed)
[place] EMERGENCY DEPARTMENT AT [facility] Provider Note   CSN: [id number] Arrival date & time: 09/29/22  1120     History  Chief Complaint  Patient presents with   Amanda [name] is a 89 y.o. female.  Patient is a 89 year old female with a history of chronic A-fib on Xarelto, hypothyroidism and diabetes who is presenting today after a fall at her home.  She reports that she was coming out of the house and missed the last step and fell onto her left side.  She does report minimally hitting her head but denies loss of consciousness.  No pain in her head, arms, chest or abdomen but severe pain in her left hip she was unable to stand or walk.  EMS noted that her hip was deformed gave her 25 mcg of fentanyl.  Patient reports she still having pain denies any numbness or tingling in her foot.  The history is provided by the patient and the EMS personnel.  Fall       Home Medications Prior to Admission medications   Medication Sig Start Date End Date Taking? Authorizing Provider  acetaminophen (TYLENOL) 500 MG tablet Take 1,000 mg by mouth every 6 (six) hours as needed (for pain).    [provider]  Cholecalciferol (VITAMIN D-3 PO) Take 1 capsule by mouth daily.    [provider]  Cyanocobalamin (VITAMIN B-12 PO) Take 1 tablet by mouth daily.    [provider]  ferrous sulfate 325 (65 FE) MG EC tablet Take 1 tablet (325 mg total) by mouth 2 (two) times daily. 03/20/22 03/20/23  Leatha Gilding, MD  furosemide (LASIX) 40 MG tablet Take 1 tablet (40 mg total) by mouth daily as needed for fluid or edema. 03/20/22 03/20/23  Leatha Gilding, MD  glimepiride (AMARYL) 1 MG tablet Take 1.5 mg by mouth daily with breakfast.     [provider]  Homeopathic Products (ARNICARE ARNICA) CREA Apply 1 application  topically 2 (two) times daily as needed (leg pains).    [provider]  levothyroxine (SYNTHROID, LEVOTHROID) 88  MCG tablet Take 88 mcg by mouth daily before breakfast.    [provider]  lisinopril (ZESTRIL) 20 MG tablet Take 20 mg by mouth daily. 12/16/20   [provider]  loratadine (CLARITIN) 10 MG tablet Take 10 mg by mouth daily. [employer] brand - Allerclear    [provider]  MAGNESIUM OXIDE PO Take 1 tablet by mouth every evening.    [provider]  meclizine (ANTIVERT) 25 MG tablet Take one every 6-8 hours as needed for dizziness Patient taking differently: Take 25 mg by mouth daily as needed for dizziness. 09/04/15   Bethann Berkshire, MD  Multiple Vitamins-Minerals (PRESERVISION AREDS 2 PO) Take 1 capsule by mouth 2 (two) times daily.     [provider]  Niacin (VITAMIN B-3 PO) Take 1 tablet by mouth daily.    [provider]  Omega-3 Fatty Acids (FISH OIL PO) Take 1 capsule by mouth every evening.    [provider]  Probiotic Product (PROBIOTIC DAILY PO) Take 2 capsules by mouth daily as needed (digestion).    [provider]  rOPINIRole (REQUIP) 0.25 MG tablet Take 0.25 mg by mouth See admin instructions. 0.25 mg around lunch time, 0.25 mg at bedtime around 2230    [provider]  sodium chloride (OCEAN) 0.65 % nasal spray Place 1 spray into the nose daily  as needed (nasal congestion).    [provider]  Wheat Dextrin (BENEFIBER) POWD Take 1 Scoop by mouth daily.    [provider]  XARELTO 15 MG TABS tablet TAKE 1 TABLET(15 MG) BY MOUTH DAILY WITH SUPPER Patient taking differently: Take 15 mg by mouth daily with supper. 01/27/22   Nahser, Deloris Ping, MD      Allergies    Calcium-vitamin d [calcium carb-ergocalciferol], Tape, Codeine, Hydrocodone, Metoprolol, Rythmol [propafenone hcl], Shellfish allergy, and Sulfa antibiotics    Review of Systems   Review of Systems  Physical Exam Updated Vital Signs BP (!) 188/72   Pulse 61   Temp 97.6 F (36.4 C) (Axillary)   Resp 20   Ht 5\' 3"  (1.6 m)    Wt 68.9 kg   SpO2 100%   BMI 26.91 kg/m  Physical Exam Vitals and nursing note reviewed.  Constitutional:      General: She is not in acute distress.    Appearance: She is well-developed.  HENT:     Head: Normocephalic and atraumatic.  Eyes:     Pupils: Pupils are equal, round, and reactive to light.  Cardiovascular:     Rate and Rhythm: Normal rate. Rhythm irregular.     Pulses: Normal pulses.     Heart sounds: Normal heart sounds. No murmur heard.    No friction rub.  Pulmonary:     Effort: Pulmonary effort is normal.     Breath sounds: Normal breath sounds. No wheezing or rales.  Abdominal:     General: Bowel sounds are normal. There is no distension.     Palpations: Abdomen is soft.     Tenderness: There is no abdominal tenderness. There is no guarding or rebound.  Musculoskeletal:        General: Tenderness present.     Left hip: Deformity, tenderness and bony tenderness present. Decreased range of motion.     Right knee: Normal.     Left knee: Normal.     Right ankle: No swelling or ecchymosis. Tenderness present over the lateral malleolus. Normal range of motion.     Comments: No edema  Skin:    General: Skin is warm and dry.     Findings: No rash.  Neurological:     Mental Status: She is alert and oriented to person, place, and time.     Cranial Nerves: No cranial nerve deficit.  Psychiatric:        Behavior: Behavior normal.     ED Results / Procedures / Treatments   Labs (all labs ordered are listed, but only abnormal results are displayed) Labs Reviewed  BASIC METABOLIC PANEL - Abnormal; Notable for the following components:      Result Value   Sodium 132 (*)    Glucose, Bld 246 (*)    Calcium 8.7 (*)    GFR, Estimated 55 (*)    All other components within normal limits  CBC WITH DIFFERENTIAL/PLATELET - Abnormal; Notable for the following components:   RBC 3.46 (*)    Hemoglobin 11.2 (*)    HCT 35.7 (*)    MCV 103.2 (*)    All other components  within normal limits  PROTIME-INR - Abnormal; Notable for the following components:   Prothrombin Time 17.2 (*)    INR 1.4 (*)    All other components within normal limits  TYPE AND SCREEN    EKG EKG Interpretation  Date/Time:  Monday Sep 29 2022 11:33:41 EDT Ventricular Rate:  57  PR Interval:  174 QRS Duration: 123 QT Interval:  487 QTC Calculation: 475 R Axis:   86 Text Interpretation: Atrial fibrillation Nonspecific intraventricular conduction delay Probable anterior infarct, age indeterminate No significant change since last tracing Confirmed by Gwyneth Sprout ([id number]) on 09/29/2022 11:52:48 AM  Radiology DG Ankle Complete Right  Result Date: 09/29/2022 CLINICAL DATA:  Tripped and fell going down 1 step landing on right side. EXAM: RIGHT ANKLE - COMPLETE 3+ VIEW COMPARISON:  None Available. FINDINGS: Ankle mortise is normal. Findings suggested a possible fracture of the medial malleolus seen only on the frontal film. Small inferior calcaneal spur. Minimal degenerative change of the midfoot. IMPRESSION: Possible fracture of the medial malleolus seen only on the frontal film. Electronically Signed   By: Elberta Fortis M.D.   On: 09/29/2022 13:15   DG Hip Unilat With Pelvis 2-3 Views Left  Result Date: 09/29/2022 CLINICAL DATA:  Tripped and fell over 1 steps landing on right side. Left lower extremity deformity. EXAM: DG HIP (WITH OR WITHOUT PELVIS) 2-3V LEFT COMPARISON:  None Available. FINDINGS: Mild diffuse decreased bone mineralization. Minimal symmetric degenerative changes of the hips. There is a displaced intertrochanteric fracture of the left hip. Minimally displaced lesser trochanteric fragment. Minimal exaggerated coxa vera about the fracture site. Degenerative changes of the spine. IMPRESSION: Displaced intertrochanteric fracture of the left hip. Electronically Signed   By: Elberta Fortis M.D.   On: 09/29/2022 13:12   DG Chest Port 1 View  Result Date: 09/29/2022 CLINICAL  DATA:  Tripped and fell over one-step landing on right side. EXAM: PORTABLE CHEST 1 VIEW COMPARISON:  03/20/2022 FINDINGS: Lungs are hyperexpanded and otherwise clear. Mild stable cardiomegaly. No evidence of acute fracture. IMPRESSION: 1. No acute findings. 2. Mild stable cardiomegaly. Electronically Signed   By: Elberta Fortis M.D.   On: 09/29/2022 13:10   CT HEAD WO CONTRAST  Result Date: 09/29/2022 CLINICAL DATA:  Head trauma, minor EXAM: CT HEAD WITHOUT CONTRAST TECHNIQUE: Contiguous axial images were obtained from the base of the skull through the vertex without intravenous contrast. RADIATION DOSE REDUCTION: This exam was performed according to the departmental dose-optimization program which includes automated exposure control, adjustment of the mA and/or kV according to patient size and/or use of iterative reconstruction technique. COMPARISON:  CT head dated May 16, 2020 FINDINGS: Brain: No evidence of acute infarction, hemorrhage, hydrocephalus, extra-axial collection or mass lesion/mass effect. Patchy areas of low-attenuation of the periventricular and subcortical white matter presumed moderate chronic microvascular ischemic changes. Mild generalized cerebral atrophy. Vascular: No hyperdense vessel or unexpected calcification. Skull: Normal. Negative for fracture or focal lesion. Sinuses/Orbits: No acute finding. Other: None. IMPRESSION: 1. No acute intracranial abnormality. 2. Moderate chronic microvascular ischemic changes of the white matter. Mild generalized cerebral atrophy. Electronically Signed   By: Larose Hires D.O.   On: 09/29/2022 12:21    Procedures Procedures    Medications Ordered in ED Medications  fentaNYL (SUBLIMAZE) injection 25 mcg (25 mcg Intravenous Given 09/29/22 1146)  ondansetron (ZOFRAN) injection 4 mg (4 mg Intravenous Patient Refused/Not Given 09/29/22 1147)  fentaNYL (SUBLIMAZE) injection 25 mcg (has no administration in time range)    ED Course/ Medical  Decision Making/ A&P                             Medical Decision Making Amount and/or Complexity of Data Reviewed Labs: ordered. Decision-making details documented in ED Course. Radiology: ordered and independent interpretation performed.  Decision-making details documented in ED Course. ECG/medicine tests: ordered and independent interpretation performed. Decision-making details documented in ED Course.  Risk Prescription drug management. Decision regarding hospitalization.   Pt with multiple medical problems and comorbidities and presenting today with a complaint that caries a high risk for morbidity and mortality.  Here today after a fall at home when she missed the last step.  Patient has concern for left hip fracture also complaining of some right ankle pain.  She is on Xarelto and did hit her head but had no loss of consciousness.  She is awake and oriented and at her baseline at this time.  Does not appear to have any chest or abdominal trauma.  Hip fracture protocol initiated.  Patient given more pain medication.  X-rays are pending.  1:36 PM I independently interpreted patient's EKG and labs.  EKG with atrial fibrillation which is rate controlled, BMP, CBC without acute findings.  I have independently visualized and interpreted pt's images today.  Hip image today shows a left intertrochanteric femur fracture.  Chest x-ray and head CT without acute findings.  Findings discussed with the patient and her family.  Patient does desire to talk with orthopedics as she was independent in walking prior to this.  Spoke with Dale [place] with orthopedics and they will consult on the patient.  She last had her Xarelto last night.  Will consult medicine for admission and clearance.         Final Clinical Impression(s) / ED Diagnoses Final diagnoses:  Fall, initial encounter  Displaced intertrochanteric fracture of left femur, initial encounter for closed fracture [facility])    Rx / DC  Orders ED Discharge Orders     None         Gwyneth Sprout, MD 09/29/22 1336

## 2022-09-29 NOTE — H&P (Signed)
History and Physical    Patient: Amanda [name] ZOX:[id number] DOB: [birth year] DOA: 09/29/2022 DOS: the patient was seen and examined on 09/29/2022 PCP: Lupita Raider, MD  Patient coming from: Home - lives alone; NOK: Daughter, August Saucer, [phone]   Chief Complaint: Fall  HPI: Amanda [name] is a 89 y.o. female with medical history significant of afib on Xarelto, DM, and hypothyroidism presenting with a fall.   Patient lives in a split level house and ambulates with a walker on each level.  She goes sideways up and down the stairs so that she can grip the unilateral handrail with both hands.  She was doing this today when she missed a step and fell, landing directly on her L hip.  She has a caregiver who was preparing her shower at the time.  No other injuries.  Last Xarelto dose was last night.  She is very independent and would like to go to CIR post-operatively if possible and then home.  She is having uncontrolled pain of the L hip.  No known cardiac history, recent CP, recent SOB.    ER Course:  Lives independently in split level house, missed a step, fell and broke L hip.  On Xarelto, last dose last night.     Review of Systems: As mentioned in the history of present illness. All other systems reviewed and are negative. Past Medical History:  Diagnosis Date   Carpal tunnel syndrome    Chronic anticoagulation    Chronic atrial fibrillation (HCC)    Diabetes mellitus    NON-INSULIN   Hypothyroidism    Sinus congestion    Past Surgical History:  Procedure Laterality Date   APPENDECTOMY     CARDIOVASCULAR STRESS TEST  07/10/2004   EF 81%   CHOLECYSTECTOMY     US ECHOCARDIOGRAPHY  12/06/2007   EF 55-60%   Social History:  reports that she has never smoked. She has never used smokeless tobacco. She reports that she does not drink alcohol and does not use drugs.  Allergies  Allergen Reactions   Calcium-Vitamin D [Calcium Carb-Ergocalciferol] Other (See  Comments)    Constipation    Tape Other (See Comments)    Skin very thin, tears easily   Codeine Nausea Only   Hydrocodone Nausea Only   Metoprolol Nausea Only   Rythmol [Propafenone Hcl] Other (See Comments)    Dizziness    Shellfish Allergy Itching and Rash    Mussels only.   Sulfa Antibiotics Nausea Only    Family History  Problem Relation Age of Onset   Stroke Mother    Hypertension Mother    Diabetes Sister    Hypertension Other    Heart attack Neg Hx     Prior to Admission medications   Medication Sig Start Date End Date Taking? Authorizing Provider  acetaminophen (TYLENOL) 500 MG tablet Take 1,000 mg by mouth every 6 (six) hours as needed (for pain).    [provider]  Cholecalciferol (VITAMIN D-3 PO) Take 1 capsule by mouth daily.    [provider]  Cyanocobalamin (VITAMIN B-12 PO) Take 1 tablet by mouth daily.    [provider]  ferrous sulfate 325 (65 FE) MG EC tablet Take 1 tablet (325 mg total) by mouth 2 (two) times daily. 03/20/22 03/20/23  Leatha Gilding, MD  furosemide (LASIX) 40 MG tablet Take 1 tablet (40 mg total) by mouth daily as needed for fluid or edema. 03/20/22 03/20/23  Leatha Gilding, MD  glimepiride (AMARYL) 1 MG tablet Take 1.5 mg by mouth daily with breakfast.     [provider]  Homeopathic Products (ARNICARE ARNICA) CREA Apply 1 application  topically 2 (two) times daily as needed (leg pains).    [provider]  levothyroxine (SYNTHROID, LEVOTHROID) 88 MCG tablet Take 88 mcg by mouth daily before breakfast.    [provider]  lisinopril (ZESTRIL) 20 MG tablet Take 20 mg by mouth daily. 12/16/20   [provider]  loratadine (CLARITIN) 10 MG tablet Take 10 mg by mouth daily. [employer] brand - Allerclear    [provider]  MAGNESIUM OXIDE PO Take 1 tablet by mouth every evening.    [provider]  meclizine (ANTIVERT) 25 MG tablet Take one every 6-8 hours as needed  for dizziness Patient taking differently: Take 25 mg by mouth daily as needed for dizziness. 09/04/15   Bethann Berkshire, MD  Multiple Vitamins-Minerals (PRESERVISION AREDS 2 PO) Take 1 capsule by mouth 2 (two) times daily.     [provider]  Niacin (VITAMIN B-3 PO) Take 1 tablet by mouth daily.    [provider]  Omega-3 Fatty Acids (FISH OIL PO) Take 1 capsule by mouth every evening.    [provider]  Probiotic Product (PROBIOTIC DAILY PO) Take 2 capsules by mouth daily as needed (digestion).    [provider]  rOPINIRole (REQUIP) 0.25 MG tablet Take 0.25 mg by mouth See admin instructions. 0.25 mg around lunch time, 0.25 mg at bedtime around 2230    [provider]  sodium chloride (OCEAN) 0.65 % nasal spray Place 1 spray into the nose daily as needed (nasal congestion).    [provider]  Wheat Dextrin (BENEFIBER) POWD Take 1 Scoop by mouth daily.    [provider]  XARELTO 15 MG TABS tablet TAKE 1 TABLET(15 MG) BY MOUTH DAILY WITH SUPPER Patient taking differently: Take 15 mg by mouth daily with supper. 01/27/22   Nahser, Deloris Ping, MD    Physical Exam: Vitals:   09/29/22 1430 09/29/22 1500 09/29/22 1547 09/29/22 1724  BP: (!) 173/62 (!) 169/73 (!) 190/57 (!) 166/83  Pulse: 61 67 73 67  Resp: (!) 21 18 18 17   Temp:   99.7 F (37.6 C) 99 F (37.2 C)  TempSrc:   Axillary Axillary  SpO2: 95% 99% 96% 98%  Weight:      Height:       General:  Appears calm and comfortable and is in NAD; hip pain with movement Eyes:  EOMI, normal lids, iris ENT: hard of hearing,  grossly normal lips & tongue, mmm Neck:  no LAD, masses or thyromegaly Cardiovascular:  RRR, no m/r/g. 1+ L >R LE edema.  Respiratory:   CTA bilaterally with no wheezes/rales/rhonchi.  Normal respiratory effort.   Abdomen:  soft, NT, ND Skin:  no rash or induration seen on limited exam Musculoskeletal:  L leg is shortened and externally rotated Lower  extremity:  1+ L>R LE edema.  Limited foot exam with no ulcerations.  2+ distal pulses. Psychiatric:  blunted mood and affect, speech fluent and appropriate, A&O x 3 Neurologic:  CN 2-12 grossly intact, moves all extremities in coordinated fashion other than LLE     Radiological Exams on Admission: Independently reviewed - see discussion in A/P where applicable  DG Ankle Complete Right  Result Date: 09/29/2022 CLINICAL DATA:  Tripped and fell going down 1 step landing on right side. EXAM: RIGHT ANKLE -  COMPLETE 3+ VIEW COMPARISON:  None Available. FINDINGS: Ankle mortise is normal. Findings suggested a possible fracture of the medial malleolus seen only on the frontal film. Small inferior calcaneal spur. Minimal degenerative change of the midfoot. IMPRESSION: Possible fracture of the medial malleolus seen only on the frontal film. Electronically Signed   By: Elberta Fortis M.D.   On: 09/29/2022 13:15   DG Hip Unilat With Pelvis 2-3 Views Left  Result Date: 09/29/2022 CLINICAL DATA:  Tripped and fell over 1 steps landing on right side. Left lower extremity deformity. EXAM: DG HIP (WITH OR WITHOUT PELVIS) 2-3V LEFT COMPARISON:  None Available. FINDINGS: Mild diffuse decreased bone mineralization. Minimal symmetric degenerative changes of the hips. There is a displaced intertrochanteric fracture of the left hip. Minimally displaced lesser trochanteric fragment. Minimal exaggerated coxa vera about the fracture site. Degenerative changes of the spine. IMPRESSION: Displaced intertrochanteric fracture of the left hip. Electronically Signed   By: Elberta Fortis M.D.   On: 09/29/2022 13:12   DG Chest Port 1 View  Result Date: 09/29/2022 CLINICAL DATA:  Tripped and fell over one-step landing on right side. EXAM: PORTABLE CHEST 1 VIEW COMPARISON:  03/20/2022 FINDINGS: Lungs are hyperexpanded and otherwise clear. Mild stable cardiomegaly. No evidence of acute fracture. IMPRESSION: 1. No acute findings. 2. Mild  stable cardiomegaly. Electronically Signed   By: Elberta Fortis M.D.   On: 09/29/2022 13:10   CT HEAD WO CONTRAST  Result Date: 09/29/2022 CLINICAL DATA:  Head trauma, minor EXAM: CT HEAD WITHOUT CONTRAST TECHNIQUE: Contiguous axial images were obtained from the base of the skull through the vertex without intravenous contrast. RADIATION DOSE REDUCTION: This exam was performed according to the departmental dose-optimization program which includes automated exposure control, adjustment of the mA and/or kV according to patient size and/or use of iterative reconstruction technique. COMPARISON:  CT head dated May 16, 2020 FINDINGS: Brain: No evidence of acute infarction, hemorrhage, hydrocephalus, extra-axial collection or mass lesion/mass effect. Patchy areas of low-attenuation of the periventricular and subcortical white matter presumed moderate chronic microvascular ischemic changes. Mild generalized cerebral atrophy. Vascular: No hyperdense vessel or unexpected calcification. Skull: Normal. Negative for fracture or focal lesion. Sinuses/Orbits: No acute finding. Other: None. IMPRESSION: 1. No acute intracranial abnormality. 2. Moderate chronic microvascular ischemic changes of the white matter. Mild generalized cerebral atrophy. Electronically Signed   By: Larose Hires D.O.   On: 09/29/2022 12:21    EKG: Independently reviewed.  Afib with rate 57; low voltage, nonspecific ST changes with no evidence of acute ischemia   Labs on Admission: I have personally reviewed the available labs and imaging studies at the time of the admission.  Pertinent labs:     Assessment and Plan: Principal Problem:   Hip fracture (HCC) Active Problems:   Hypothyroidism   History of CVA (cerebrovascular accident)   Permanent atrial fibrillation (HCC)   Diabetes mellitus type 2, controlled (HCC)   HTN (hypertension)   Restless leg syndrome   DNR (do not resuscitate)    Hip fracture -Apparently mechanical fall  resulting in hip fracture -Orthopedics consulted -NPO after midnight in anticipation of surgical repair tomorrow -SCDs overnight, start Lovenox post-operatively (or as per ortho) -Pain control with Tylenol, Robaxin, Oxycodone, and Morphine prn -TOC team consult for rehab placement -Will need PT consult post-operatively -Hip fracture order set utilized -TXA per orthopedics -Fascia iliacus block ordered per anesthesia  Pre-operative stratification -Orthopedic/spinal surgery is associated with an intermediate (1-5%) cardiovascular risk for cardiac death and nonfatal MI -  With CHF and CVA history, the revised cardiac index gives a risk estimate of 10.1% -Because of this risk, he/she is recommended to have pre-operative EKG testing prior to surgery; this was done in the ER -The Detsky's Modified Cardiac Risk Index score is 15, with a low cardiac risk -It is reasonable for the patient to go to the OR without additional evaluation.   Possible ankle fracture -Medial malleolus fracture questioned on imaging -Will defer to orthopedics  Afib -Rate controlled without medication -Hold Xarelto peri-operatively  DM -Recent A1c was 5.7, good control -hold glimepiride -Cover with sensitive-scale SSI   HTN -Continue lisinopril -Will add prn IV hydralazine  Hypothyroidism -Continue Synthroid  Restless legs -Continue ropinirole  DNR -I have discussed code status with the patient and her children and  they are in agreement that the patient would not desire resuscitation and would prefer to die a natural death should that situation arise. -She will need a gold out of facility DNR form at the time of discharge      Advance Care Planning:   Code Status: DNR   Consults: Orthopedics; SW, Nutrition; will need PT post-operatively   DVT Prophylaxis: SCDs until approved for Lovenox by orthopedics  Family Communication: Son and daughter were present throughout evaluation  Severity of  Illness: The appropriate patient status for this patient is INPATIENT. Inpatient status is judged to be reasonable and necessary in order to provide the required intensity of service to ensure the patient's safety. The patient's presenting symptoms, physical exam findings, and initial radiographic and laboratory data in the context of their chronic comorbidities is felt to place them at high risk for further clinical deterioration. Furthermore, it is not anticipated that the patient will be medically stable for discharge from the hospital within 2 midnights of admission.   * I certify that at the point of admission it is my clinical judgment that the patient will require inpatient hospital care spanning beyond 2 midnights from the point of admission due to high intensity of service, high risk for further deterioration and high frequency of surveillance required.*  Author: Jonah Blue, MD 09/29/2022 5:54 PM  For on call review [email or web address].

## 2022-09-29 NOTE — ED Notes (Signed)
Report messaged to [facility]. Care transferred.

## 2022-09-29 NOTE — ED Notes (Signed)
Pt c/o left hip pain. Leg externally rotated. Distal CMS intact.

## 2022-09-29 NOTE — Anesthesia Procedure Notes (Signed)
Anesthesia Regional Block: Peng block   Pre-Anesthetic Checklist: , timeout performed,  Correct Patient, Correct Site, Correct Laterality,  Correct Procedure, Correct Position, site marked,  Risks and benefits discussed,  Pre-op evaluation,  At surgeon's request and post-op pain management  Laterality: Left  Prep: Maximum Sterile Barrier Precautions used, chloraprep       Needles:  Injection technique: Single-shot  Needle Type: Echogenic Stimulator Needle     Needle Length: 9cm  Needle Gauge: 21     Additional Needles:   Procedures:,,,, ultrasound used (permanent image in chart),,    Narrative:  Start time: 09/29/2022 8:20 PM End time: 09/29/2022 8:30 PM Injection made incrementally with aspirations every 5 mL.  Performed by: Personally  Anesthesiologist: Gaynelle Adu, MD

## 2022-09-29 NOTE — Anesthesia Preprocedure Evaluation (Addendum)
Anesthesia Evaluation  Patient identified by MRN, date of birth, ID band Patient awake    Reviewed: Allergy & Precautions, H&P , NPO status , Patient's Chart, lab work & pertinent test results  Airway Mallampati: II  TM Distance: >3 FB Neck ROM: Full    Dental no notable dental hx. (+) Teeth Intact, Dental Advisory Given   Pulmonary neg pulmonary ROS   Pulmonary exam normal breath sounds clear to auscultation       Cardiovascular hypertension, Pt. on medications + dysrhythmias Atrial Fibrillation  Rhythm:Regular  '23 ECHO: EF 55-60%, normal LVF, normal RVF, mod pulm HTN, mild MS, no AS   Neuro/Psych negative neurological ROS  negative psych ROS   GI/Hepatic negative GI ROS, Neg liver ROS,,,  Endo/Other  negative endocrine ROSdiabetes, Oral Hypoglycemic AgentsHypothyroidism    Renal/GU negative Renal ROS  negative genitourinary   Musculoskeletal Intertroch hip fracture   Abdominal   Peds  Hematology  (+) Blood dyscrasia, anemia Xarelto Hb 11.2, plt 190k INR 1.4   Anesthesia Other Findings   Reproductive/Obstetrics negative OB ROS                             Anesthesia Physical Anesthesia Plan  ASA: 3  Anesthesia Plan:    Post-op Pain Management: Regional block*   Induction:   PONV Risk Score and Plan: 2 and Treatment may vary due to age or medical condition  Airway Management Planned: Natural Airway and Nasal Cannula  Additional Equipment: None  Intra-op Plan:   Post-operative Plan:   Informed Consent: I have reviewed the patients History and Physical, chart, labs and discussed the procedure including the risks, benefits and alternatives for the proposed anesthesia with the patient or authorized representative who has indicated his/her understanding and acceptance.     Dental advisory given  Plan Discussed with: CRNA  Anesthesia Plan Comments: (Plan routine monitors,  PENG block for analgesia)       Anesthesia Quick Evaluation

## 2022-09-29 NOTE — ED Triage Notes (Signed)
Tripped and fell going down 1 step in the house. Landed to right side. Denies hitting head. Shortening and obvious deformity to left lower extremity. EMS gave Fentanyl. On Xarelto. Alert and oriented at baseline.

## 2022-09-29 NOTE — ED Notes (Signed)
ED TO INPATIENT HANDOFF REPORT  ED Nurse Name and Phone #: cori [phone]  S Name/Age/Gender Amanda [name] 89 y.o. female Room/Bed: 038C/038C  Code Status   Code Status: DNR  Home/SNF/Other Home Patient oriented to: self, place, time, and situation Is this baseline? Yes   Triage Complete: Triage complete  Chief Complaint Hip fracture (HCC) [S72.009A]  Triage Note Tripped and fell going down 1 step in the house. Landed to right side. Denies hitting head. Shortening and obvious deformity to left lower extremity. EMS gave Fentanyl. On Xarelto. Alert and oriented at baseline.    Allergies Allergies  Allergen Reactions   Calcium-Vitamin D [Calcium Carb-Ergocalciferol] Other (See Comments)    Constipation    Tape Other (See Comments)    Skin very thin, tears easily   Codeine Nausea Only   Hydrocodone Nausea Only   Metoprolol Nausea Only   Rythmol [Propafenone Hcl] Other (See Comments)    Dizziness    Shellfish Allergy Itching and Rash    Mussels only.   Sulfa Antibiotics Nausea Only    Level of Care/Admitting Diagnosis ED Disposition     ED Disposition  Admit   Condition  --   Comment  Hospital Area: [facility] [[id number]]  Level of Care: Med-Surg [16]  May admit patient to [facility] or [facility] if equivalent level of care is available:: Yes  Covid Evaluation: Asymptomatic - no recent exposure (last 10 days) testing not required  Diagnosis: Hip fracture [facility]) [[id number]]  Admitting Physician: Jonah Blue [[id number]]  Attending Physician: Jonah Blue [[id number]]  Certification:: I certify this patient will need inpatient services for at least 2 midnights  Estimated Length of Stay: 3          B Medical/Surgery History Past Medical History:  Diagnosis Date   Carpal tunnel syndrome    Chronic anticoagulation    Chronic atrial fibrillation (HCC)    Diabetes mellitus    NON-INSULIN   Hypothyroidism    Sinus congestion    Past  Surgical History:  Procedure Laterality Date   APPENDECTOMY     CARDIOVASCULAR STRESS TEST  07/10/2004   EF 81%   CHOLECYSTECTOMY     US ECHOCARDIOGRAPHY  12/06/2007   EF 55-60%     A IV Location/Drains/Wounds Patient Lines/Drains/Airways Status     Active Line/Drains/Airways     Name Placement date Placement time Site Days   Peripheral IV 03/17/22 20 G 1.16" Right Antecubital 03/17/22  1646  Antecubital  196   Urethral Catheter petrina, NT3 Latex 16 Fr. 09/29/22  1454  Latex  less than 1            Intake/Output Last 24 hours No intake or output data in the 24 hours ending 09/29/22 1524  Labs/Imaging Results for orders placed or performed during the hospital encounter of 09/29/22 (from the past 48 hour(s))  Basic metabolic panel     Status: Abnormal   Collection Time: 09/29/22 11:35 AM  Result Value Ref Range   Sodium 132 (L) 135 - 145 mmol/L   Potassium 4.7 3.5 - 5.1 mmol/L   Chloride 100 98 - 111 mmol/L   CO2 22 22 - 32 mmol/L   Glucose, Bld 246 (H) 70 - 99 mg/dL    Comment: Glucose reference range applies only to samples taken after fasting for at least 8 hours.   BUN 20 8 - 23 mg/dL   Creatinine, Ser 0.98 0.44 - 1.00 mg/dL   Calcium 8.7 (L)  8.9 - 10.3 mg/dL   GFR, Estimated 55 (L) >60 mL/min    Comment: (NOTE) Calculated using the CKD-EPI Creatinine Equation (2021)    Anion gap 10 5 - 15    Comment: Performed at [facility], [address], [place], [place] [address]  CBC with Differential     Status: Abnormal   Collection Time: 09/29/22 11:35 AM  Result Value Ref Range   WBC 9.2 4.0 - 10.5 K/uL   RBC 3.46 (L) 3.87 - 5.11 MIL/uL   Hemoglobin 11.2 (L) 12.0 - 15.0 g/dL   HCT 60.4 (L) 54.0 - 98.1 %   MCV 103.2 (H) 80.0 - 100.0 fL   MCH 32.4 26.0 - 34.0 pg   MCHC 31.4 30.0 - 36.0 g/dL   RDW 19.1 47.8 - 29.5 %   Platelets 190 150 - 400 K/uL   nRBC 0.0 0.0 - 0.2 %   Neutrophils Relative % 76 %   Neutro Abs 7.0 1.7 - 7.7 K/uL   Lymphocytes Relative  14 %   Lymphs Abs 1.3 0.7 - 4.0 K/uL   Monocytes Relative 8 %   Monocytes Absolute 0.7 0.1 - 1.0 K/uL   Eosinophils Relative 1 %   Eosinophils Absolute 0.1 0.0 - 0.5 K/uL   Basophils Relative 1 %   Basophils Absolute 0.1 0.0 - 0.1 K/uL   Immature Granulocytes 0 %   Abs Immature Granulocytes 0.03 0.00 - 0.07 K/uL    Comment: Performed at [facility], [address], [place], [place] [address]  Protime-INR     Status: Abnormal   Collection Time: 09/29/22 11:35 AM  Result Value Ref Range   Prothrombin Time 17.2 (H) 11.4 - 15.2 seconds   INR 1.4 (H) 0.8 - 1.2    Comment: (NOTE) INR goal varies based on device and disease states. Performed at [facility], [address] [place], [place] [address]   Type and screen [facility]     Status: None   Collection Time: 09/29/22 11:35 AM  Result Value Ref Range   ABO/RH(D) O POS    Antibody Screen NEG    Sample Expiration      10/02/2022,2359 Performed at [facility], [address], [place], [place] [address]    DG Ankle Complete Right  Result Date: 09/29/2022 CLINICAL DATA:  Tripped and fell going down 1 step landing on right side. EXAM: RIGHT ANKLE - COMPLETE 3+ VIEW COMPARISON:  None Available. FINDINGS: Ankle mortise is normal. Findings suggested a possible fracture of the medial malleolus seen only on the frontal film. Small inferior calcaneal spur. Minimal degenerative change of the midfoot. IMPRESSION: Possible fracture of the medial malleolus seen only on the frontal film. Electronically Signed   By: Elberta Fortis M.D.   On: 09/29/2022 13:15   DG Hip Unilat With Pelvis 2-3 Views Left  Result Date: 09/29/2022 CLINICAL DATA:  Tripped and fell over 1 steps landing on right side. Left lower extremity deformity. EXAM: DG HIP (WITH OR WITHOUT PELVIS) 2-3V LEFT COMPARISON:  None Available. FINDINGS: Mild diffuse decreased bone mineralization. Minimal symmetric degenerative changes of the hips. There is  a displaced intertrochanteric fracture of the left hip. Minimally displaced lesser trochanteric fragment. Minimal exaggerated coxa vera about the fracture site. Degenerative changes of the spine. IMPRESSION: Displaced intertrochanteric fracture of the left hip. Electronically Signed   By: Elberta Fortis M.D.   On: 09/29/2022 13:12   DG Chest Port 1  View  Result Date: 09/29/2022 CLINICAL DATA:  Tripped and fell over one-step landing on right side. EXAM: PORTABLE CHEST 1 VIEW COMPARISON:  03/20/2022 FINDINGS: Lungs are hyperexpanded and otherwise clear. Mild stable cardiomegaly. No evidence of acute fracture. IMPRESSION: 1. No acute findings. 2. Mild stable cardiomegaly. Electronically Signed   By: Elberta Fortis M.D.   On: 09/29/2022 13:10   CT HEAD WO CONTRAST  Result Date: 09/29/2022 CLINICAL DATA:  Head trauma, minor EXAM: CT HEAD WITHOUT CONTRAST TECHNIQUE: Contiguous axial images were obtained from the base of the skull through the vertex without intravenous contrast. RADIATION DOSE REDUCTION: This exam was performed according to the departmental dose-optimization program which includes automated exposure control, adjustment of the mA and/or kV according to patient size and/or use of iterative reconstruction technique. COMPARISON:  CT head dated May 16, 2020 FINDINGS: Brain: No evidence of acute infarction, hemorrhage, hydrocephalus, extra-axial collection or mass lesion/mass effect. Patchy areas of low-attenuation of the periventricular and subcortical white matter presumed moderate chronic microvascular ischemic changes. Mild generalized cerebral atrophy. Vascular: No hyperdense vessel or unexpected calcification. Skull: Normal. Negative for fracture or focal lesion. Sinuses/Orbits: No acute finding. Other: None. IMPRESSION: 1. No acute intracranial abnormality. 2. Moderate chronic microvascular ischemic changes of the white matter. Mild generalized cerebral atrophy. Electronically Signed   By:  Larose Hires D.O.   On: 09/29/2022 12:21    Pending Labs Unresulted Labs (From admission, onward)    None       Vitals/Pain Today's Vitals   09/29/22 1330 09/29/22 1430 09/29/22 1450 09/29/22 1500  BP: (!) 188/72 (!) 173/62  (!) 169/73  Pulse: 61 61  67  Resp: 20 (!) 21  18  Temp:      TempSrc:      SpO2: 100% 95%  99%  Weight:      Height:      PainSc:   7      Isolation Precautions No active isolations  Medications Medications  lisinopril (ZESTRIL) tablet 20 mg (has no administration in time range)  levothyroxine (SYNTHROID) tablet 88 mcg (has no administration in time range)  rOPINIRole (REQUIP) tablet 0.25 mg (has no administration in time range)  loratadine (CLARITIN) tablet 10 mg (has no administration in time range)  sodium chloride (OCEAN) 0.65 % nasal spray 1 spray (has no administration in time range)  morphine (PF) 2 MG/ML injection 2 mg (has no administration in time range)  methocarbamol (ROBAXIN) tablet 500 mg (has no administration in time range)    Or  methocarbamol (ROBAXIN) 500 mg in dextrose 5 % 50 mL IVPB (has no administration in time range)  docusate sodium (COLACE) capsule 100 mg (has no administration in time range)  polyethylene glycol (MIRALAX / GLYCOLAX) packet 17 g (has no administration in time range)  bisacodyl (DULCOLAX) EC tablet 5 mg (has no administration in time range)  oxyCODONE (Oxy IR/ROXICODONE) immediate release tablet 5-10 mg (has no administration in time range)  insulin aspart (novoLOG) injection 0-9 Units (has no administration in time range)  hydrALAZINE (APRESOLINE) injection 5 mg (has no administration in time range)  fentaNYL (SUBLIMAZE) injection 25 mcg (25 mcg Intravenous Given 09/29/22 1336)    Mobility walks with device     Focused Assessments S/p mechanical fall, c/o left hip pain. Dx: with hip fracture. Leg shortened and externally rotated. Distal CMS intact.    R Recommendations: See Admitting Provider  Note  Report given to:   Additional Notes: n/a

## 2022-09-30 DIAGNOSIS — S72002A Fracture of unspecified part of neck of left femur, initial encounter for closed fracture: Secondary | ICD-10-CM | POA: Diagnosis not present

## 2022-09-30 LAB — GLUCOSE, CAPILLARY
Glucose-Capillary: 197 mg/dL — ABNORMAL HIGH (ref 70–99)
Glucose-Capillary: 202 mg/dL — ABNORMAL HIGH (ref 70–99)
Glucose-Capillary: 204 mg/dL — ABNORMAL HIGH (ref 70–99)
Glucose-Capillary: 196 mg/dL — ABNORMAL HIGH (ref 70–99)
Glucose-Capillary: 182 mg/dL — ABNORMAL HIGH (ref 70–99)

## 2022-09-30 NOTE — Progress Notes (Signed)
Initial Nutrition Assessment  DOCUMENTATION CODES:   Not applicable  INTERVENTION:  Encouraged adequate intake of calories and protein at meals. Discussed choosing a source of protein at each meal.  Provide Magic cup TID with meals, each supplement provides 290 kcal and 9 grams of protein.   NUTRITION DIAGNOSIS:   Increased nutrient needs related to hip fracture, post-op healing as evidenced by estimated needs.  GOAL:   Patient will meet greater than or equal to 90% of their needs  MONITOR:   PO intake, Supplement acceptance, Labs, Weight trends, I & O's  REASON FOR ASSESSMENT:   Consult Assessment of nutrition requirement/status (Hip/Femur fracture patient)  ASSESSMENT:   89 year old female with PMHx of chronic A-fib, DM, hypothyroidism admitted after a fall found to have left hip fracture, also with possible fracture of right medial malleolus.  -Plan for IM nail placement on 5/15  Met with pt and her daughter at bedside. Pt lives independently at home. She prepares her own breakfast and family brings food for lunch and dinner. She reports having a good appetite at home. Daughter reports she eats well-balanced meals but that portion sizes have slowly declined over time. For breakfast pt typically eats egg with sausage, 1/2 muffin, juice, and coffee. For lunch or dinner she may have a sandwich or meat with sides. She reports allergy to mussels, but that she can tolerate other shellfish. She denies nausea, emesis, or abdominal pain. Pt has poor/missing dentition. Daughter reports pt does not want dentures. She does have some difficulty chewing, but does not want diet downgraded as she wants to be able to choose what she can tolerate from the menu. She does not want diet changed from CHO modified at this time as she reports that is not restricting what she would like to order at meals. Discussed increased calorie/protein needs for post-operative healing. Pt does not want to try ONS  such as Ensure at this time, but is amenable to trying Magic Cup with meals. Daughter has been helping pt order meals and they each contain a source of protein. Pt documented to have eaten 75% of breakfast this morning. Of note, patient's birthday is tomorrow and she will be 89.  Pt reports her recent UBW is 149 lbs and that she weighs herself daily. Daughter reports previous UBW was 170 lbs and pt had slow weight loss over 5-6 years. Noted in chart pt was 76.2 kg on 09/11/2017. Weight was 72-74 kg in 2020, 71 kg in 2021-2022, and 70 kg in 2023. Current wt 68.9 kg (151.9 lbs). Weight loss not significant for time frame.  Medications reviewed and include: Colace 100 mg BID, Novolog 0-9 units TID, levothyroxine, lisinopril Pt reports taking vitamin D3 (unknown dose), vitamin B12 (unknown dose), and OTC iron supplement at home under direction of PCP  Labs reviewed: CBG 174-204, Sodium 132 HgbA1c 5.7 on 03/18/22  UOP: 600 mL documented UOP in previous 24 hours  I/O: -120 mL since admission  NUTRITION - FOCUSED PHYSICAL EXAM:  Flowsheet Row Most Recent Value  Orbital Region No depletion  Upper Arm Region Mild depletion  Thoracic and Lumbar Region No depletion  Buccal Region No depletion  Temple Region Moderate depletion  Clavicle Bone Region Severe depletion  Clavicle and Acromion Bone Region Moderate depletion  Scapular Bone Region Unable to assess  Dorsal Hand Mild depletion  Patellar Region Mild depletion  Anterior Thigh Region Mild depletion  Posterior Calf Region Mild depletion  Edema (RD Assessment) Mild  Hair  Reviewed  Eyes Reviewed  Mouth Reviewed  [poor/missing dentition]  Skin Reviewed  Nails Reviewed      Diet Order:   Diet Order             Diet Carb Modified Fluid consistency: Thin; Room service appropriate? Yes  Diet effective ____                  EDUCATION NEEDS:   Education needs have been addressed  Skin:  Skin Assessment: Reviewed RN  Assessment  Last BM:  09/28/22  Height:   Ht Readings from Last 1 Encounters:  09/29/22 5\' 3"  (1.6 m)   Weight:   Wt Readings from Last 1 Encounters:  09/29/22 68.9 kg   Ideal Body Weight:  52.3 kg  BMI:  Body mass index is 26.91 kg/m.  Estimated Nutritional Needs:   Kcal:  1500-1700  Protein:  82-92 grams  Fluid:  1.5-1.7 L/day  Letta Median, MS, RD, LDN, CNSC Pager number available on Amion

## 2022-09-30 NOTE — Plan of Care (Signed)
  Problem: Nutrition: Goal: Adequate nutrition will be maintained Outcome: Progressing   Problem: Pain Managment: Goal: General experience of comfort will improve Outcome: Not Progressing   

## 2022-09-30 NOTE — Progress Notes (Signed)
I talked to patient's daughter this afternoon and explained the patient's orthopedic condition and proposed surgery.  All questions were answered.  Will plan for surgical repair tomorrow.  NPO after midnight.  Mayra Reel, MD [facility] 6:05 PM

## 2022-09-30 NOTE — Progress Notes (Signed)
PROGRESS NOTE    Amanda [name]  WUJ:[id number] DOB: 1918/08/20 DOA: 09/29/2022 PCP: Lupita Raider, MD   Brief Narrative: Amanda [name] is a 89 y.o. female with a history of chronic atrial fibrillation, diabetes mellitus, hypothyroidism. Patient presented after a fall and subsequent pain preventing ambulation. Patient was found to have a left hip fracture. Orthopedic surgery consulted for surgical management.   Assessment and Plan:  Left hip fracture Secondary to fall without syncope. Orthopedic surgery consulted and plan for IM nail placement on 5/15. Patient received a nerve block for ongoing pain management. -Continue analgesics PRN -Orthopedic surgery follow-up/recommendations -Family requesting acute inpatient rehabilitation. Unsure if patient will be a candidate  Possible fracture of the medial malleolus Noted on foot xray. -Orthopedic surgery recommendations/management  Chronic atrial fibrillation Patient is on Xarelto as an outpatient. Patient is not on rate control. Xarelto held secondary need for surgery. -Heparin IV; Coumadin  Diabetes mellitus type 2 Well controlled. Last hemoglobin A1C of 5.7% from 02/2022. -Continue SSI  Primary hypertensin -Continue lisinopril  Hypothyroidism -Continue Synthroid  Restless leg syndrome -Continue Requip   DVT prophylaxis: SCDs Code Status:   Code Status: DNR Family Communication: Daughter at bedside Disposition Plan: Discharge likely to SNF pending orthopedic surgery recommendations and PT/OT recommendations post-surgery   Consultants:  Orthopedic surgery  Procedures:  5/13: AN AD HOC NERVE BLOCK  Antimicrobials: None    Subjective: Patient reports moderate pain of her left leg  Objective: BP (!) 129/50 (BP Location: Right Arm)   Pulse 69   Temp 97.9 F (36.6 C) (Oral)   Resp 18   Ht 5\' 3"  (1.6 m)   Wt 68.9 kg   SpO2 95%   BMI 26.91 kg/m   Examination:  General exam: Appears calm and  comfortable Respiratory system: Clear to auscultation. Respiratory effort normal. Cardiovascular system: S1 & S2 heard Gastrointestinal system: Abdomen is nondistended, soft and nontender. Normal bowel sounds heard. Central nervous system: Alert and oriented. Musculoskeletal: No edema. Skin: No cyanosis. No rashes Psychiatry: Judgement and insight appear normal. Mood & affect appropriate.    Data Reviewed: I have personally reviewed following labs and imaging studies  CBC Lab Results  Component Value Date   WBC 9.2 09/29/2022   RBC 3.46 (L) 09/29/2022   HGB 11.2 (L) 09/29/2022   HCT 35.7 (L) 09/29/2022   MCV 103.2 (H) 09/29/2022   MCH 32.4 09/29/2022   PLT 190 09/29/2022   MCHC 31.4 09/29/2022   RDW 14.1 09/29/2022   LYMPHSABS 1.3 09/29/2022   MONOABS 0.7 09/29/2022   EOSABS 0.1 09/29/2022   BASOSABS 0.1 09/29/2022     Last metabolic panel Lab Results  Component Value Date   NA 132 (L) 09/29/2022   K 4.7 09/29/2022   CL 100 09/29/2022   CO2 22 09/29/2022   BUN 20 09/29/2022   CREATININE 0.92 09/29/2022   GLUCOSE 246 (H) 09/29/2022   GFRNONAA 55 (L) 09/29/2022   GFRAA >60 12/31/2018   CALCIUM 8.7 (L) 09/29/2022   PHOS 4.2 03/18/2022   PROT 6.0 (L) 03/18/2022   ALBUMIN 3.2 (L) 03/18/2022   BILITOT 0.8 03/18/2022   ALKPHOS 62 03/18/2022   AST 18 03/18/2022   ALT 11 03/18/2022   ANIONGAP 10 09/29/2022    GFR: Estimated Creatinine Clearance: 29.5 mL/min (by C-G formula based on SCr of 0.92 mg/dL).  No results found for this or any previous visit (from the past 240 hour(s)).    Radiology Studies: DG Ankle Complete Right  Result Date: 09/29/2022 CLINICAL DATA:  Tripped and fell going down 1 step landing on right side. EXAM: RIGHT ANKLE - COMPLETE 3+ VIEW COMPARISON:  None Available. FINDINGS: Ankle mortise is normal. Findings suggested a possible fracture of the medial malleolus seen only on the frontal film. Small inferior calcaneal spur. Minimal degenerative  change of the midfoot. IMPRESSION: Possible fracture of the medial malleolus seen only on the frontal film. Electronically Signed   By: Elberta Fortis M.D.   On: 09/29/2022 13:15   DG Hip Unilat With Pelvis 2-3 Views Left  Result Date: 09/29/2022 CLINICAL DATA:  Tripped and fell over 1 steps landing on right side. Left lower extremity deformity. EXAM: DG HIP (WITH OR WITHOUT PELVIS) 2-3V LEFT COMPARISON:  None Available. FINDINGS: Mild diffuse decreased bone mineralization. Minimal symmetric degenerative changes of the hips. There is a displaced intertrochanteric fracture of the left hip. Minimally displaced lesser trochanteric fragment. Minimal exaggerated coxa vera about the fracture site. Degenerative changes of the spine. IMPRESSION: Displaced intertrochanteric fracture of the left hip. Electronically Signed   By: Elberta Fortis M.D.   On: 09/29/2022 13:12   DG Chest Port 1 View  Result Date: 09/29/2022 CLINICAL DATA:  Tripped and fell over one-step landing on right side. EXAM: PORTABLE CHEST 1 VIEW COMPARISON:  03/20/2022 FINDINGS: Lungs are hyperexpanded and otherwise clear. Mild stable cardiomegaly. No evidence of acute fracture. IMPRESSION: 1. No acute findings. 2. Mild stable cardiomegaly. Electronically Signed   By: Elberta Fortis M.D.   On: 09/29/2022 13:10   CT HEAD WO CONTRAST  Result Date: 09/29/2022 CLINICAL DATA:  Head trauma, minor EXAM: CT HEAD WITHOUT CONTRAST TECHNIQUE: Contiguous axial images were obtained from the base of the skull through the vertex without intravenous contrast. RADIATION DOSE REDUCTION: This exam was performed according to the departmental dose-optimization program which includes automated exposure control, adjustment of the mA and/or kV according to patient size and/or use of iterative reconstruction technique. COMPARISON:  CT head dated May 16, 2020 FINDINGS: Brain: No evidence of acute infarction, hemorrhage, hydrocephalus, extra-axial collection or mass  lesion/mass effect. Patchy areas of low-attenuation of the periventricular and subcortical white matter presumed moderate chronic microvascular ischemic changes. Mild generalized cerebral atrophy. Vascular: No hyperdense vessel or unexpected calcification. Skull: Normal. Negative for fracture or focal lesion. Sinuses/Orbits: No acute finding. Other: None. IMPRESSION: 1. No acute intracranial abnormality. 2. Moderate chronic microvascular ischemic changes of the white matter. Mild generalized cerebral atrophy. Electronically Signed   By: Larose Hires D.O.   On: 09/29/2022 12:21      LOS: 1 day    Jacquelin Hawking, MD [facility] 09/30/2022, 12:45 PM   If 7PM-7AM, please contact night-coverage [email or web address]

## 2022-09-30 NOTE — Hospital Course (Signed)
Amanda [name] is a 89 y.o. female with a history of chronic atrial fibrillation, diabetes mellitus, hypothyroidism. Patient presented after a fall and subsequent pain preventing ambulation. Patient was found to have a left hip fracture. Orthopedic surgery consulted for surgical management.

## 2022-09-30 NOTE — Plan of Care (Signed)

## 2022-10-01 ENCOUNTER — Inpatient Hospital Stay (HOSPITAL_COMMUNITY): Payer: Medicare Other

## 2022-10-01 ENCOUNTER — Inpatient Hospital Stay (HOSPITAL_COMMUNITY): Payer: Medicare Other | Admitting: Anesthesiology

## 2022-10-01 ENCOUNTER — Other Ambulatory Visit: Payer: Self-pay

## 2022-10-01 ENCOUNTER — Encounter (HOSPITAL_COMMUNITY): Payer: Self-pay | Admitting: Internal Medicine

## 2022-10-01 ENCOUNTER — Encounter (HOSPITAL_COMMUNITY)
Admission: EM | Disposition: A | Discharge status: Rehabilitation Facility | Payer: Self-pay | Source: Home / Self Care | Attending: Internal Medicine

## 2022-10-01 DIAGNOSIS — E119 Type 2 diabetes mellitus without complications: Secondary | ICD-10-CM | POA: Diagnosis not present

## 2022-10-01 DIAGNOSIS — D638 Anemia in other chronic diseases classified elsewhere: Secondary | ICD-10-CM

## 2022-10-01 DIAGNOSIS — S72102A Unspecified trochanteric fracture of left femur, initial encounter for closed fracture: Secondary | ICD-10-CM | POA: Diagnosis not present

## 2022-10-01 DIAGNOSIS — Z7984 Long term (current) use of oral hypoglycemic drugs: Secondary | ICD-10-CM | POA: Diagnosis not present

## 2022-10-01 DIAGNOSIS — S72142A Displaced intertrochanteric fracture of left femur, initial encounter for closed fracture: Principal | ICD-10-CM | POA: Diagnosis present

## 2022-10-01 DIAGNOSIS — E039 Hypothyroidism, unspecified: Secondary | ICD-10-CM

## 2022-10-01 DIAGNOSIS — I10 Essential (primary) hypertension: Secondary | ICD-10-CM | POA: Diagnosis not present

## 2022-10-01 HISTORY — PX: INTRAMEDULLARY (IM) NAIL INTERTROCHANTERIC: SHX5875

## 2022-10-01 LAB — BASIC METABOLIC PANEL
Anion gap: 8 (ref 5–15)
BUN: 30 mg/dL — ABNORMAL HIGH (ref 8–23)
CO2: 23 mmol/L (ref 22–32)
Calcium: 8.3 mg/dL — ABNORMAL LOW (ref 8.9–10.3)
Chloride: 98 mmol/L (ref 98–111)
Creatinine, Ser: 1.05 mg/dL — ABNORMAL HIGH (ref 0.44–1.00)
GFR, Estimated: 47 mL/min — ABNORMAL LOW (ref >60)
Glucose, Bld: 144 mg/dL — ABNORMAL HIGH (ref 70–99)
Potassium: 4.8 mmol/L (ref 3.5–5.1)
Sodium: 129 mmol/L — ABNORMAL LOW (ref 135–145)

## 2022-10-01 LAB — CBC
HCT: 25.2 % — ABNORMAL LOW (ref 36.0–46.0)
Hemoglobin: 8.2 g/dL — ABNORMAL LOW (ref 12.0–15.0)
MCH: 31.9 pg (ref 26.0–34.0)
MCHC: 32.5 g/dL (ref 30.0–36.0)
MCV: 98.1 fL (ref 80.0–100.0)
Platelets: 129 10*3/uL — ABNORMAL LOW (ref 150–400)
RBC: 2.57 MIL/uL — ABNORMAL LOW (ref 3.87–5.11)
RDW: 14.2 % (ref 11.5–15.5)
WBC: 14.8 10*3/uL — ABNORMAL HIGH (ref 4.0–10.5)
nRBC: 0 % (ref 0.0–0.2)

## 2022-10-01 LAB — GLUCOSE, CAPILLARY
Glucose-Capillary: 133 mg/dL — ABNORMAL HIGH (ref 70–99)
Glucose-Capillary: 130 mg/dL — ABNORMAL HIGH (ref 70–99)
Glucose-Capillary: 113 mg/dL — ABNORMAL HIGH (ref 70–99)
Glucose-Capillary: 122 mg/dL — ABNORMAL HIGH (ref 70–99)
Glucose-Capillary: 128 mg/dL — ABNORMAL HIGH (ref 70–99)
Glucose-Capillary: 189 mg/dL — ABNORMAL HIGH (ref 70–99)

## 2022-10-01 LAB — PREPARE RBC (CROSSMATCH)

## 2022-10-01 LAB — SURGICAL PCR SCREEN
MRSA, PCR: NEGATIVE
Staphylococcus aureus: NEGATIVE

## 2022-10-01 LAB — BPAM RBC: Blood Product Expiration Date: 202406152359

## 2022-10-01 SURGERY — FIXATION, FRACTURE, INTERTROCHANTERIC, WITH INTRAMEDULLARY ROD
Anesthesia: General | Site: Leg Upper | Laterality: Left

## 2022-10-01 MED ORDER — ORAL CARE MOUTH RINSE: 15.0000 mL | Freq: Once | OROMUCOSAL | Status: AC

## 2022-10-01 MED ORDER — FENTANYL CITRATE (PF) 250 MCG/5ML IJ SOLN
INTRAMUSCULAR | Status: DC | PRN
  Administered 2022-10-01 (×2): 25 ug via INTRAVENOUS

## 2022-10-01 MED ORDER — ONDANSETRON HCL 4 MG/2ML IJ SOLN
4.0000 mg | Freq: Four times a day (QID) | INTRAMUSCULAR | Status: DC | PRN
  Administered 2022-10-02: 4 mg via INTRAVENOUS
  Filled 2022-10-01: qty 2

## 2022-10-01 MED ORDER — CHLORHEXIDINE GLUCONATE 0.12 % MT SOLN
15.0000 mL | Freq: Once | OROMUCOSAL | Status: AC
  Administered 2022-10-01: 15 mL via OROMUCOSAL

## 2022-10-01 MED ORDER — TRANEXAMIC ACID 1000 MG/10ML IV SOLN
2000.0000 mg | Freq: Once | INTRAVENOUS | Status: DC
  Filled 2022-10-01: qty 20

## 2022-10-01 MED ORDER — TRANEXAMIC ACID-NACL 1000-0.7 MG/100ML-% IV SOLN
1000.0000 mg | Freq: Once | INTRAVENOUS | Status: AC
  Administered 2022-10-01: 1000 mg via INTRAVENOUS
  Filled 2022-10-01: qty 100

## 2022-10-01 MED ORDER — ONDANSETRON HCL 4 MG PO TABS: 4.0000 mg | ORAL_TABLET | Freq: Four times a day (QID) | ORAL | Status: DC | PRN

## 2022-10-01 MED ORDER — DEXAMETHASONE SODIUM PHOSPHATE 10 MG/ML IJ SOLN
INTRAMUSCULAR | Status: AC
  Filled 2022-10-01: qty 1

## 2022-10-01 MED ORDER — PHENOL 1.4 % MT LIQD: 1.0000 | OROMUCOSAL | Status: DC | PRN

## 2022-10-01 MED ORDER — ACETAMINOPHEN 10 MG/ML IV SOLN
INTRAVENOUS | Status: DC | PRN
  Administered 2022-10-01: 1000 mg via INTRAVENOUS

## 2022-10-01 MED ORDER — PHENYLEPHRINE 80 MCG/ML (10ML) SYRINGE FOR IV PUSH (FOR BLOOD PRESSURE SUPPORT)
PREFILLED_SYRINGE | INTRAVENOUS | Status: DC | PRN
  Administered 2022-10-01: 80 ug via INTRAVENOUS
  Administered 2022-10-01: 160 ug via INTRAVENOUS

## 2022-10-01 MED ORDER — LIDOCAINE 2% (20 MG/ML) 5 ML SYRINGE
INTRAMUSCULAR | Status: AC
  Filled 2022-10-01: qty 5

## 2022-10-01 MED ORDER — ACETAMINOPHEN 500 MG PO TABS
1000.0000 mg | ORAL_TABLET | Freq: Four times a day (QID) | ORAL | Status: AC
  Administered 2022-10-01 – 2022-10-02 (×2): 1000 mg via ORAL
  Filled 2022-10-01 (×3): qty 2

## 2022-10-01 MED ORDER — LIDOCAINE 2% (20 MG/ML) 5 ML SYRINGE
INTRAMUSCULAR | Status: DC | PRN
  Administered 2022-10-01: 60 mg via INTRAVENOUS

## 2022-10-01 MED ORDER — ACETAMINOPHEN 325 MG PO TABS
325.0000 mg | ORAL_TABLET | Freq: Four times a day (QID) | ORAL | Status: DC | PRN
  Administered 2022-10-03: 650 mg via ORAL
  Filled 2022-10-01: qty 2

## 2022-10-01 MED ORDER — OXYCODONE HCL 5 MG PO TABS
5.0000 mg | ORAL_TABLET | ORAL | Status: DC | PRN
  Administered 2022-10-02 – 2022-10-03 (×2): 5 mg via ORAL
  Filled 2022-10-01 (×2): qty 1

## 2022-10-01 MED ORDER — PHENYLEPHRINE 80 MCG/ML (10ML) SYRINGE FOR IV PUSH (FOR BLOOD PRESSURE SUPPORT)
PREFILLED_SYRINGE | INTRAVENOUS | Status: AC
  Filled 2022-10-01: qty 10

## 2022-10-01 MED ORDER — SODIUM CHLORIDE 0.9 % IV SOLN: INTRAVENOUS | Status: DC

## 2022-10-01 MED ORDER — HYDROMORPHONE HCL 1 MG/ML IJ SOLN
0.5000 mg | INTRAMUSCULAR | Status: DC | PRN
  Administered 2022-10-01 – 2022-10-04 (×5): 1 mg via INTRAVENOUS
  Administered 2022-10-04: 0.5 mg via INTRAVENOUS
  Filled 2022-10-01 (×6): qty 1

## 2022-10-01 MED ORDER — POLYETHYLENE GLYCOL 3350 17 G PO PACK: 17.0000 g | PACK | Freq: Every day | ORAL | Status: DC | PRN

## 2022-10-01 MED ORDER — OXYCODONE HCL 5 MG PO TABS
10.0000 mg | ORAL_TABLET | ORAL | Status: DC | PRN
  Administered 2022-10-02 – 2022-10-05 (×3): 15 mg via ORAL
  Filled 2022-10-01 (×4): qty 3

## 2022-10-01 MED ORDER — MAGNESIUM CITRATE PO SOLN: 1.0000 | Freq: Once | ORAL | Status: DC | PRN

## 2022-10-01 MED ORDER — SORBITOL 70 % SOLN: 30.0000 mL | Freq: Every day | Status: DC | PRN

## 2022-10-01 MED ORDER — 0.9 % SODIUM CHLORIDE (POUR BTL) OPTIME
TOPICAL | Status: DC | PRN
  Administered 2022-10-01: 1000 mL

## 2022-10-01 MED ORDER — PROPOFOL 10 MG/ML IV BOLUS
INTRAVENOUS | Status: AC
  Filled 2022-10-01: qty 20

## 2022-10-01 MED ORDER — TRANEXAMIC ACID 1000 MG/10ML IV SOLN
2000.0000 mg | INTRAVENOUS | Status: DC
  Filled 2022-10-01 (×2): qty 20

## 2022-10-01 MED ORDER — METHOCARBAMOL 1000 MG/10ML IJ SOLN: 500.0000 mg | Freq: Four times a day (QID) | INTRAVENOUS | Status: DC | PRN

## 2022-10-01 MED ORDER — SUGAMMADEX SODIUM 200 MG/2ML IV SOLN
INTRAVENOUS | Status: DC | PRN
  Administered 2022-10-01: 140 mg via INTRAVENOUS

## 2022-10-01 MED ORDER — DEXAMETHASONE SODIUM PHOSPHATE 10 MG/ML IJ SOLN
INTRAMUSCULAR | Status: DC | PRN
  Administered 2022-10-01: 4 mg via INTRAVENOUS

## 2022-10-01 MED ORDER — OXYCODONE-ACETAMINOPHEN 5-325 MG PO TABS: 1.0000 | ORAL_TABLET | Freq: Two times a day (BID) | ORAL | 0 refills | Status: DC | PRN

## 2022-10-01 MED ORDER — ALUM & MAG HYDROXIDE-SIMETH 200-200-20 MG/5ML PO SUSP
30.0000 mL | ORAL | Status: DC | PRN
  Administered 2022-10-02: 30 mL via ORAL
  Filled 2022-10-01: qty 30

## 2022-10-01 MED ORDER — CEFAZOLIN SODIUM-DEXTROSE 2-4 GM/100ML-% IV SOLN
2.0000 g | Freq: Four times a day (QID) | INTRAVENOUS | Status: AC
  Administered 2022-10-01 – 2022-10-02 (×3): 2 g via INTRAVENOUS
  Filled 2022-10-01 (×3): qty 100

## 2022-10-01 MED ORDER — ACETAMINOPHEN 10 MG/ML IV SOLN
INTRAVENOUS | Status: AC
  Filled 2022-10-01: qty 100

## 2022-10-01 MED ORDER — EPHEDRINE 5 MG/ML INJ
INTRAVENOUS | Status: AC
  Filled 2022-10-01: qty 5

## 2022-10-01 MED ORDER — DOCUSATE SODIUM 100 MG PO CAPS
100.0000 mg | ORAL_CAPSULE | Freq: Two times a day (BID) | ORAL | Status: DC
  Administered 2022-10-01 – 2022-10-03 (×3): 100 mg via ORAL
  Filled 2022-10-01 (×5): qty 1

## 2022-10-01 MED ORDER — ROCURONIUM BROMIDE 10 MG/ML (PF) SYRINGE
PREFILLED_SYRINGE | INTRAVENOUS | Status: DC | PRN
  Administered 2022-10-01: 40 mg via INTRAVENOUS

## 2022-10-01 MED ORDER — MENTHOL 3 MG MT LOZG: 1.0000 | LOZENGE | OROMUCOSAL | Status: DC | PRN

## 2022-10-01 MED ORDER — CHLORHEXIDINE GLUCONATE 0.12 % MT SOLN
OROMUCOSAL | Status: AC
  Filled 2022-10-01: qty 15

## 2022-10-01 MED ORDER — ROCURONIUM BROMIDE 10 MG/ML (PF) SYRINGE
PREFILLED_SYRINGE | INTRAVENOUS | Status: AC
  Filled 2022-10-01: qty 10

## 2022-10-01 MED ORDER — SODIUM CHLORIDE 0.9% IV SOLUTION: Freq: Once | INTRAVENOUS | Status: DC

## 2022-10-01 MED ORDER — EPHEDRINE SULFATE-NACL 50-0.9 MG/10ML-% IV SOSY
PREFILLED_SYRINGE | INTRAVENOUS | Status: DC | PRN
  Administered 2022-10-01: 5 mg via INTRAVENOUS

## 2022-10-01 MED ORDER — ONDANSETRON HCL 4 MG/2ML IJ SOLN
INTRAMUSCULAR | Status: AC
  Filled 2022-10-01: qty 2

## 2022-10-01 MED ORDER — METHOCARBAMOL 500 MG PO TABS
500.0000 mg | ORAL_TABLET | Freq: Four times a day (QID) | ORAL | Status: DC | PRN
  Administered 2022-10-02 – 2022-10-04 (×5): 500 mg via ORAL
  Filled 2022-10-01 (×7): qty 1

## 2022-10-01 MED ORDER — PHENYLEPHRINE HCL-NACL 20-0.9 MG/250ML-% IV SOLN
INTRAVENOUS | Status: DC | PRN
  Administered 2022-10-01: 50 ug/min via INTRAVENOUS

## 2022-10-01 MED ORDER — CHLORHEXIDINE GLUCONATE CLOTH 2 % EX PADS
6.0000 | MEDICATED_PAD | Freq: Every day | CUTANEOUS | Status: DC
  Administered 2022-10-02 – 2022-10-06 (×5): 6 via TOPICAL

## 2022-10-01 MED ORDER — FENTANYL CITRATE (PF) 250 MCG/5ML IJ SOLN
INTRAMUSCULAR | Status: AC
  Filled 2022-10-01: qty 5

## 2022-10-01 MED ORDER — ONDANSETRON HCL 4 MG/2ML IJ SOLN
INTRAMUSCULAR | Status: DC | PRN
  Administered 2022-10-01: 4 mg via INTRAVENOUS

## 2022-10-01 MED ORDER — PROPOFOL 10 MG/ML IV BOLUS
INTRAVENOUS | Status: DC | PRN
  Administered 2022-10-01: 40 mg via INTRAVENOUS

## 2022-10-01 MED ORDER — CEFAZOLIN SODIUM-DEXTROSE 2-3 GM-%(50ML) IV SOLR
INTRAVENOUS | Status: DC | PRN
  Administered 2022-10-01: 2 g via INTRAVENOUS

## 2022-10-01 SURGICAL SUPPLY — 57 items
BAG COUNTER SPONGE SURGICOUNT (BAG) ×1 IMPLANT
BAG SPNG CNTER NS LX DISP (BAG) ×1
BIT DRILL INTERTAN LAG SCREW (BIT) IMPLANT
BIT DRILL SHORT 4.0 (BIT) IMPLANT
BNDG CMPR 5X6 CHSV STRCH STRL (GAUZE/BANDAGES/DRESSINGS)
BNDG COHESIVE 4X5 TAN STRL (GAUZE/BANDAGES/DRESSINGS) ×1 IMPLANT
BNDG COHESIVE 6X5 TAN ST LF (GAUZE/BANDAGES/DRESSINGS) IMPLANT
BNDG GAUZE DERMACEA FLUFF 4 (GAUZE/BANDAGES/DRESSINGS) ×1 IMPLANT
BNDG GZE DERMACEA 4 6PLY (GAUZE/BANDAGES/DRESSINGS)
COVER PERINEAL POST (MISCELLANEOUS) ×1 IMPLANT
COVER SURGICAL LIGHT HANDLE (MISCELLANEOUS) ×1 IMPLANT
DRAPE C-ARM 42X72 X-RAY (DRAPES) IMPLANT
DRAPE C-ARMOR (DRAPES) ×1 IMPLANT
DRAPE STERI IOBAN 125X83 (DRAPES) ×1 IMPLANT
DRESSING MEPILEX FLEX 4X4 (GAUZE/BANDAGES/DRESSINGS) ×1 IMPLANT
DRILL BIT SHORT 4.0 (BIT) ×2
DRSG MEPILEX FLEX 4X4 (GAUZE/BANDAGES/DRESSINGS) ×3
DRSG MEPILEX POST OP 4X8 (GAUZE/BANDAGES/DRESSINGS) ×1 IMPLANT
DRSG XEROFORM 1X8 (GAUZE/BANDAGES/DRESSINGS) IMPLANT
DURAPREP 26ML APPLICATOR (WOUND CARE) ×1 IMPLANT
ELECT REM PT RETURN 9FT ADLT (ELECTROSURGICAL) ×1
ELECTRODE REM PT RTRN 9FT ADLT (ELECTROSURGICAL) ×1 IMPLANT
GAUZE PAD ABD 8X10 STRL (GAUZE/BANDAGES/DRESSINGS) ×2 IMPLANT
GLOVE BIOGEL PI IND STRL 7.0 (GLOVE) ×2 IMPLANT
GLOVE BIOGEL PI IND STRL 7.5 (GLOVE) ×1 IMPLANT
GLOVE ECLIPSE 7.0 STRL STRAW (GLOVE) ×1 IMPLANT
GLOVE SKINSENSE STRL SZ7.5 (GLOVE) ×2 IMPLANT
GLOVE SURG SYN 7.5  E (GLOVE)
GLOVE SURG SYN 7.5 E (GLOVE) IMPLANT
GLOVE SURG SYN 7.5 PF PI (GLOVE) ×2 IMPLANT
GLOVE SURG UNDER POLY LF SZ7 (GLOVE) ×19 IMPLANT
GLOVE SURG UNDER POLY LF SZ7.5 (GLOVE) ×4 IMPLANT
GOWN STRL SURGICAL XL XLNG (GOWN DISPOSABLE) ×1 IMPLANT
GUIDE PIN 3.2X343 (PIN) ×2
GUIDE PIN 3.2X343MM (PIN) ×2
KIT BASIN OR (CUSTOM PROCEDURE TRAY) ×1 IMPLANT
KIT TURNOVER KIT B (KITS) ×1 IMPLANT
MANIFOLD NEPTUNE II (INSTRUMENTS) ×1 IMPLANT
NAIL LEFT 10X36 (Nail) IMPLANT
NS IRRIG 1000ML POUR BTL (IV SOLUTION) ×1 IMPLANT
PACK GENERAL/GYN (CUSTOM PROCEDURE TRAY) ×1 IMPLANT
PAD ARMBOARD 7.5X6 YLW CONV (MISCELLANEOUS) ×2 IMPLANT
PAD CAST 4YDX4 CTTN HI CHSV (CAST SUPPLIES) ×2 IMPLANT
PADDING CAST COTTON 4X4 STRL (CAST SUPPLIES)
PIN GUIDE 3.2X343MM (PIN) IMPLANT
SCREW LAG COMPR KIT 85/80 (Screw) IMPLANT
SCREW TRIGEN LOW PROF 5.0X35 (Screw) IMPLANT
SCREW TRIGEN LOW PROF 5.0X37.5 (Screw) IMPLANT
STAPLER VISISTAT 35W (STAPLE) ×1 IMPLANT
SUT VIC AB 0 CT1 27 (SUTURE)
SUT VIC AB 0 CT1 27XBRD ANBCTR (SUTURE) ×1 IMPLANT
SUT VIC AB 0 CT1 36 (SUTURE) IMPLANT
SUT VIC AB 2-0 CT1 27 (SUTURE) ×1
SUT VIC AB 2-0 CT1 TAPERPNT 27 (SUTURE) ×1 IMPLANT
TOWEL GREEN STERILE (TOWEL DISPOSABLE) ×1 IMPLANT
TOWEL GREEN STERILE FF (TOWEL DISPOSABLE) ×1 IMPLANT
WATER STERILE IRR 1000ML POUR (IV SOLUTION) ×1 IMPLANT

## 2022-10-01 NOTE — Anesthesia Preprocedure Evaluation (Addendum)
Anesthesia Evaluation  Patient identified by MRN, date of birth, ID band Patient awake    Reviewed: Allergy & Precautions, NPO status , Patient's Chart, lab work & pertinent test results  Airway Mallampati: II  TM Distance: >3 FB Neck ROM: Full    Dental  (+) Teeth Intact, Dental Advisory Given   Pulmonary neg pulmonary ROS   Pulmonary exam normal breath sounds clear to auscultation       Cardiovascular hypertension, Pt. on medications Normal cardiovascular exam Rhythm:Regular Rate:Normal     Neuro/Psych negative neurological ROS     GI/Hepatic negative GI ROS, Neg liver ROS,,,  Endo/Other  diabetes, Type 2, Oral Hypoglycemic AgentsHypothyroidism    Renal/GU negative Renal ROS     Musculoskeletal LEFT HIP FX   Abdominal   Peds  Hematology  (+) Blood dyscrasia (Xarelto), anemia   Anesthesia Other Findings Day of surgery medications reviewed with the patient.  Reproductive/Obstetrics                             Anesthesia Physical Anesthesia Plan  ASA: 4  Anesthesia Plan: General   Post-op Pain Management: Ofirmev IV (intra-op)*   Induction: Intravenous  PONV Risk Score and Plan: 3 and Dexamethasone and Ondansetron  Airway Management Planned: Oral ETT  Additional Equipment:   Intra-op Plan:   Post-operative Plan: Extubation in OR  Informed Consent: I have reviewed the patients History and Physical, chart, labs and discussed the procedure including the risks, benefits and alternatives for the proposed anesthesia with the patient or authorized representative who has indicated his/her understanding and acceptance.   Patient has DNR.  Discussed DNR with patient, Continue DNR and Discussed DNR with power of attorney.   Dental advisory given  Plan Discussed with: CRNA  Anesthesia Plan Comments:        Anesthesia Quick Evaluation

## 2022-10-01 NOTE — Op Note (Addendum)
   Date of Surgery: 10/01/2022  INDICATIONS: Amanda [name] is a 89 y.o.-year-old female who sustained a left hip fracture. The risks and benefits of the procedure discussed with the patient prior to the procedure and all questions were answered; consent was obtained.  PREOPERATIVE DIAGNOSIS: left pertrochanteric hip fracture   POSTOPERATIVE DIAGNOSIS: Same   PROCEDURE: Open treatment of intertrochanteric, pertrochanteric, subtrochanteric fracture with intramedullary implant. CPT [phone]   SURGEON: N. Glee Arvin, M.D.   ASSIST: None  ANESTHESIA: general   IV FLUIDS AND URINE: See anesthesia record   ESTIMATED BLOOD LOSS: 150 cc  IMPLANTS: [employer] InterTAN 10 x 36 cm, 85/80 compression screws, 2 distal interlocking screws  DRAINS: None.   COMPLICATIONS: see description of procedure.   DESCRIPTION OF PROCEDURE: The patient was brought to the operating room and placed supine on the operating table. The patient's leg had been signed prior to the procedure. The patient had the anesthesia placed by the anesthesiologist. The prep verification and incision time-outs were performed to confirm that this was the correct patient, site, side and location. The patient had an SCD on the opposite lower extremity. The patient did receive antibiotics prior to the incision and was re-dosed during the procedure as needed at indicated intervals. The patient was positioned on the fracture table with the table in traction and internal rotation to reduce the hip. The well leg was placed in a scissor position and all bony prominences were well-padded. The patient had the lower extremity prepped and draped in the standard surgical fashion. The incision was made 4 finger breadths superior to the greater trochanter. A guide pin was inserted into the tip of the greater trochanter under fluoroscopic guidance. An opening reamer was used to gain access to the femoral canal. The nail length was measured and inserted  down the femoral canal to its proper depth. The appropriate version of insertion for the lag screw was found under fluoroscopy. A pin was inserted up the femoral neck through the jig. Then, a second antirotation pin was inserted inferior to the first pin. The length of the lag screw was then measured. The lag screw was inserted as near to center-center in the head as possible. The antirotation pin was then taken out and an interdigitating compression screw was placed in its place. The leg was taken out of traction, then the interdigitating compression screw was used to compress across the fracture. Compression was visualized on serial xrays.  Two distal interlocking screws were placed using the perfect circle technique.  The wounds were copiously irrigated with saline and the subcutaneous layer closed with 2.0 vicryl and the skin was reapproximated with staples. The wounds were cleaned and dried a final time and a sterile dressing was placed. The hip was taken through a range of motion at the end of the case under fluoroscopic imaging to visualize the approach-withdraw phenomenon and confirm implant length in the head. The patient was then awakened from anesthesia and taken to the recovery room in stable condition. All counts were correct at the end of the case.    POSTOPERATIVE PLAN: The patient will be weight bearing as tolerated and will return in 2 weeks for staple removal and the patient will receive DVT prophylaxis based on other medications, activity level, and risk ratio of bleeding to thrombosis.   Mayra Reel, MD [facility] 3:58 PM

## 2022-10-01 NOTE — Anesthesia Procedure Notes (Signed)
Procedure Name: Intubation Date/Time: 10/01/2022 2:48 PM  Performed by: Alwyn Ren, CRNAPre-anesthesia Checklist: Patient identified, Emergency Drugs available, Suction available and Patient being monitored Patient Re-evaluated:Patient Re-evaluated prior to induction Oxygen Delivery Method: Circle system utilized Preoxygenation: Pre-oxygenation with 100% oxygen Induction Type: IV induction Ventilation: Mask ventilation without difficulty Laryngoscope Size: Mac, 3, Miller and 2 Grade View: Grade II Tube type: Oral Tube size: 7.0 mm Number of attempts: 2 Airway Equipment and Method: Stylet and Oral airway Placement Confirmation: ETT inserted through vocal cords under direct vision, positive ETCO2 and breath sounds checked- equal and bilateral Secured at: 22 cm Tube secured with: Tape Dental Injury: Teeth and Oropharynx as per pre-operative assessment

## 2022-10-01 NOTE — Progress Notes (Signed)
I have reviewed the ankle xrays which shows small medial malleolus fracture.  She can WBAT.  Will not need surgical attention.

## 2022-10-01 NOTE — Plan of Care (Signed)

## 2022-10-01 NOTE — Care Management Important Message (Signed)
Important Message  Patient Details  Name: Amanda [name] MRN: [id number] Date of Birth: Apr 14, 1919   Medicare Important Message Given:  Yes     Sherilyn Banker 10/01/2022, 11:55 AM

## 2022-10-01 NOTE — Discharge Instructions (Signed)
    1. Change dressings as needed 2. May shower but keep incisions covered and dry 3. Take xarelto to prevent blood clots 4. Take stool softeners as needed 5. Take pain meds as needed

## 2022-10-01 NOTE — H&P (Signed)

## 2022-10-01 NOTE — Transfer of Care (Signed)
Immediate Anesthesia Transfer of Care Note  Patient: Amanda [name]  Procedure(s) Performed: INTRAMEDULLARY (IM) NAIL INTERTROCHANTERIC (Left: Leg Upper)  Patient Location: PACU  Anesthesia Type:General  Level of Consciousness: awake and sedated  Airway & Oxygen Therapy: Patient Spontanous Breathing and Patient connected to face mask oxygen  Post-op Assessment: Report given to RN and Post -op Vital signs reviewed and stable  Post vital signs: Reviewed and stable  Last Vitals:  Vitals Value Taken Time  BP 129/89 10/01/22 1610  Temp    Pulse 94 10/01/22 1613  Resp 19 10/01/22 1613  SpO2 97 % 10/01/22 1613  Vitals shown include unvalidated device data.  Last Pain:  Vitals:   10/01/22 1359  TempSrc:   PainSc: 3          Complications: No notable events documented.

## 2022-10-01 NOTE — Progress Notes (Signed)
PROGRESS NOTE    Amanda [name]  ZOX:[id number] DOB: 09/05/1918 DOA: 09/29/2022 PCP: Lupita Raider, MD     Brief Narrative:  Amanda [name] is a 89 y.o. female with a history of chronic atrial fibrillation, diabetes mellitus, hypothyroidism. Patient presented after a fall at home and subsequent pain preventing ambulation. Patient was found to have a left hip fracture. Orthopedic surgery consulted for surgical management.  New events last 24 hours / Subjective: Today is her 89nd birthday.  Family is at bedside.  Awaiting OR today.  Patient has been hesitant to take pain medication, but did receive a dose early this morning.  Family states that patient lives at home alone, is ambulatory, independent, pay own bills etc.  Assessment & Plan:   Principal Problem:   Displaced intertrochanteric fracture of left femur, initial encounter for closed fracture [facility]) Active Problems:   Hypothyroidism   History of CVA (cerebrovascular accident)   Permanent atrial fibrillation (HCC)   Diabetes mellitus type 2, controlled (HCC)   HTN (hypertension)   Hip fracture (HCC)   Restless leg syndrome   DNR (do not resuscitate)  Left hip fracture secondary to mechanical fall -Orthopedic surgery consulted -OR today  Chronic A-fib -Xarelto on hold currently  Diabetes mellitus type 2 -SSI  Hypertension -Lisinopril  Hypothyroidism -Synthroid  RLS -Requip  CKD stage IIIa -Stable   DVT prophylaxis: Xarelto on hold preoperatively SCDs Start: 09/29/22 1516  Code Status: DNR Family Communication: Family at bedside Disposition Plan: Pending postop course, may need SNF versus home health Status is: Inpatient Remains inpatient appropriate because: OR today    Antimicrobials:  Anti-infectives (From admission, onward)    None        Objective: Vitals:   09/30/22 2002 10/01/22 0627 10/01/22 0740 10/01/22 1344  BP: (!) 122/50 (!) 124/47 (!) 100/56 (!) 127/59  Pulse: 72 63  61 80  Resp: 18 18 18 18   Temp: 98 F (36.7 C) 98.1 F (36.7 C) 98.2 F (36.8 C) 98.7 F (37.1 C)  TempSrc: Oral Oral Axillary   SpO2: 97% 97% 95% 96%  Weight:      Height:        Intake/Output Summary (Last 24 hours) at 10/01/2022 1439 Last data filed at 10/01/2022 0600 Gross per 24 hour  Intake --  Output 600 ml  Net -600 ml   Filed Weights   09/29/22 1127  Weight: 68.9 kg    Examination:  General exam: Appears calm and comfortable  Respiratory system: Clear to auscultation. Respiratory effort normal. No respiratory distress. No conversational dyspnea.  Cardiovascular system: S1 & S2 heard Gastrointestinal system: Abdomen is nondistended, soft and nontender. Normal bowel sounds heard. Central nervous system: Alert and oriented.  Extremities: Symmetric in appearance  Skin: No rashes, lesions or ulcers on exposed skin  Psychiatry: Judgement and insight appear normal. Mood & affect appropriate.   Data Reviewed: I have personally reviewed following labs and imaging studies  CBC: Recent Labs  Lab 09/29/22 1135 10/01/22 0625  WBC 9.2 14.8*  NEUTROABS 7.0  --   HGB 11.2* 8.2*  HCT 35.7* 25.2*  MCV 103.2* 98.1  PLT 190 129*   Basic Metabolic Panel: Recent Labs  Lab 09/29/22 1135 10/01/22 0625  NA 132* 129*  K 4.7 4.8  CL 100 98  CO2 22 23  GLUCOSE 246* 144*  BUN 20 30*  CREATININE 0.92 1.05*  CALCIUM 8.7* 8.3*   GFR: Estimated Creatinine Clearance: 25.2 mL/min (A) (by C-G formula based on  SCr of 1.05 mg/dL (H)). Liver Function Tests: No results for input(s): "AST", "ALT", "ALKPHOS", "BILITOT", "PROT", "ALBUMIN" in the last 168 hours. No results for input(s): "LIPASE", "AMYLASE" in the last 168 hours. No results for input(s): "AMMONIA" in the last 168 hours. Coagulation Profile: Recent Labs  Lab 09/29/22 1135  INR 1.4*   Cardiac Enzymes: No results for input(s): "CKTOTAL", "CKMB", "CKMBINDEX", "TROPONINI" in the last 168 hours. BNP (last 3  results) No results for input(s): "PROBNP" in the last 8760 hours. HbA1C: No results for input(s): "HGBA1C" in the last 72 hours. CBG: Recent Labs  Lab 09/30/22 1610 09/30/22 2003 10/01/22 0739 10/01/22 1127 10/01/22 1404  GLUCAP 196* 182* 133* 130* 113*   Lipid Profile: No results for input(s): "CHOL", "HDL", "LDLCALC", "TRIG", "CHOLHDL", "LDLDIRECT" in the last 72 hours. Thyroid Function Tests: No results for input(s): "TSH", "T4TOTAL", "FREET4", "T3FREE", "THYROIDAB" in the last 72 hours. Anemia Panel: No results for input(s): "VITAMINB12", "FOLATE", "FERRITIN", "TIBC", "IRON", "RETICCTPCT" in the last 72 hours. Sepsis Labs: No results for input(s): "PROCALCITON", "LATICACIDVEN" in the last 168 hours.  Recent Results (from the past 240 hour(s))  Surgical pcr screen     Status: None   Collection Time: 09/30/22  5:40 PM   Specimen: Nasal Mucosa; Nasal Swab  Result Value Ref Range Status   MRSA, PCR NEGATIVE NEGATIVE Final   Staphylococcus aureus NEGATIVE NEGATIVE Final    Comment: (NOTE) The Xpert SA Assay (FDA approved for NASAL specimens in patients 51 years of age and older), is one component of a comprehensive surveillance program. It is not intended to diagnose infection nor to guide or monitor treatment. Performed at [facility], [address] [place], [place] [address]       Radiology Studies: No results found.    Scheduled Meds:  chlorhexidine       [MAR Hold] Chlorhexidine Gluconate Cloth  6 each Topical Daily   [MAR Hold] docusate sodium  100 mg Oral BID   [MAR Hold] insulin aspart  0-9 Units Subcutaneous TID WC   [MAR Hold] levothyroxine  88 mcg Oral Q0600   [MAR Hold] lisinopril  20 mg Oral Daily   [MAR Hold] loratadine  10 mg Oral Daily   [MAR Hold] rOPINIRole  0.25 mg Oral QHS   tranexamic acid (CYKLOKAPRON) 2,000 mg in sodium chloride 0.9 % 50 mL Topical Application  2,000 mg Topical To OR   Continuous Infusions:  sodium chloride      [MAR Hold] methocarbamol (ROBAXIN) IV       LOS: 2 days   Time spent: 25 minutes   Noralee Stain, DO [facility] 10/01/2022, 2:39 PM   Available via Epic secure chat 7am-7pm After these hours, please refer to coverage provider listed on [email or web address]

## 2022-10-01 NOTE — Anesthesia Postprocedure Evaluation (Signed)
Anesthesia Post Note  Patient: Amanda [name]  Procedure(s) Performed: INTRAMEDULLARY (IM) NAIL INTERTROCHANTERIC (Left: Leg Upper)     Patient location during evaluation: PACU Anesthesia Type: General Level of consciousness: awake and alert Pain management: pain level controlled Vital Signs Assessment: post-procedure vital signs reviewed and stable Respiratory status: spontaneous breathing, nonlabored ventilation, respiratory function stable and patient connected to nasal cannula oxygen Cardiovascular status: blood pressure returned to baseline and stable Postop Assessment: no apparent nausea or vomiting Anesthetic complications: no   No notable events documented.  Last Vitals:  Vitals:   10/01/22 1606 10/01/22 1615  BP: (!) 129/59 (!) 129/59  Pulse: 93 94  Resp: 15 15  Temp: 37.1 C 37.1 C  SpO2: 100% 97%    Last Pain:  Vitals:   10/01/22 1359  TempSrc:   PainSc: 3                  Caitlin Ainley

## 2022-10-02 ENCOUNTER — Inpatient Hospital Stay (HOSPITAL_COMMUNITY): Payer: Medicare Other

## 2022-10-02 ENCOUNTER — Encounter (HOSPITAL_COMMUNITY): Payer: Self-pay | Admitting: Orthopaedic Surgery

## 2022-10-02 DIAGNOSIS — S72142A Displaced intertrochanteric fracture of left femur, initial encounter for closed fracture: Secondary | ICD-10-CM | POA: Diagnosis not present

## 2022-10-02 DIAGNOSIS — I639 Cerebral infarction, unspecified: Secondary | ICD-10-CM | POA: Diagnosis not present

## 2022-10-02 LAB — CBC
HCT: 27.1 % — ABNORMAL LOW (ref 36.0–46.0)
Hemoglobin: 8.9 g/dL — ABNORMAL LOW (ref 12.0–15.0)
MCH: 31.6 pg (ref 26.0–34.0)
MCHC: 32.8 g/dL (ref 30.0–36.0)
MCV: 96.1 fL (ref 80.0–100.0)
Platelets: 127 10*3/uL — ABNORMAL LOW (ref 150–400)
RBC: 2.82 MIL/uL — ABNORMAL LOW (ref 3.87–5.11)
RDW: 15.9 % — ABNORMAL HIGH (ref 11.5–15.5)
WBC: 13.5 10*3/uL — ABNORMAL HIGH (ref 4.0–10.5)
nRBC: 0 % (ref 0.0–0.2)

## 2022-10-02 LAB — BASIC METABOLIC PANEL
Anion gap: 11 (ref 5–15)
Anion gap: 10 (ref 5–15)
BUN: 38 mg/dL — ABNORMAL HIGH (ref 8–23)
BUN: 40 mg/dL — ABNORMAL HIGH (ref 8–23)
CO2: 21 mmol/L — ABNORMAL LOW (ref 22–32)
CO2: 23 mmol/L (ref 22–32)
Calcium: 8 mg/dL — ABNORMAL LOW (ref 8.9–10.3)
Calcium: 8.1 mg/dL — ABNORMAL LOW (ref 8.9–10.3)
Chloride: 97 mmol/L — ABNORMAL LOW (ref 98–111)
Chloride: 97 mmol/L — ABNORMAL LOW (ref 98–111)
Creatinine, Ser: 1.3 mg/dL — ABNORMAL HIGH (ref 0.44–1.00)
Creatinine, Ser: 1.4 mg/dL — ABNORMAL HIGH (ref 0.44–1.00)
GFR, Estimated: 36 mL/min — ABNORMAL LOW (ref >60)
GFR, Estimated: 33 mL/min — ABNORMAL LOW (ref >60)
Glucose, Bld: 255 mg/dL — ABNORMAL HIGH (ref 70–99)
Glucose, Bld: 231 mg/dL — ABNORMAL HIGH (ref 70–99)
Potassium: 6 mmol/L — ABNORMAL HIGH (ref 3.5–5.1)
Potassium: 5.5 mmol/L — ABNORMAL HIGH (ref 3.5–5.1)
Sodium: 129 mmol/L — ABNORMAL LOW (ref 135–145)
Sodium: 130 mmol/L — ABNORMAL LOW (ref 135–145)

## 2022-10-02 LAB — GLUCOSE, CAPILLARY
Glucose-Capillary: 201 mg/dL — ABNORMAL HIGH (ref 70–99)
Glucose-Capillary: 218 mg/dL — ABNORMAL HIGH (ref 70–99)
Glucose-Capillary: 138 mg/dL — ABNORMAL HIGH (ref 70–99)
Glucose-Capillary: 165 mg/dL — ABNORMAL HIGH (ref 70–99)
Glucose-Capillary: 130 mg/dL — ABNORMAL HIGH (ref 70–99)

## 2022-10-02 LAB — TYPE AND SCREEN
ABO/RH(D): O POS
Antibody Screen: NEGATIVE
Unit division: 0

## 2022-10-02 LAB — BPAM RBC
ISSUE DATE / TIME: 202405151524
Unit Type and Rh: 5100

## 2022-10-02 LAB — POTASSIUM: Potassium: 4.7 mmol/L (ref 3.5–5.1)

## 2022-10-02 MED ORDER — ALBUTEROL SULFATE (2.5 MG/3ML) 0.083% IN NEBU
10.0000 mg | INHALATION_SOLUTION | Freq: Once | RESPIRATORY_TRACT | Status: AC
  Administered 2022-10-02: 10 mg via RESPIRATORY_TRACT
  Filled 2022-10-02: qty 12

## 2022-10-02 MED ORDER — DEXTROSE 50 % IV SOLN
1.0000 | Freq: Once | INTRAVENOUS | Status: AC
  Administered 2022-10-02: 50 mL via INTRAVENOUS
  Filled 2022-10-02: qty 50

## 2022-10-02 MED ORDER — SODIUM CHLORIDE 0.9 % IV SOLN
INTRAVENOUS | Status: DC
  Administered 2022-10-04: 125 mL/h via INTRAVENOUS

## 2022-10-02 MED ORDER — SODIUM CHLORIDE 0.9 % IV SOLN
12.5000 mg | Freq: Four times a day (QID) | INTRAVENOUS | Status: DC | PRN
  Administered 2022-10-02: 12.5 mg via INTRAVENOUS
  Filled 2022-10-02: qty 0.5

## 2022-10-02 MED ORDER — INSULIN ASPART 100 UNIT/ML IV SOLN
10.0000 [IU] | Freq: Once | INTRAVENOUS | Status: AC
  Administered 2022-10-02: 10 [IU] via INTRAVENOUS

## 2022-10-02 MED ORDER — RIVAROXABAN 15 MG PO TABS
15.0000 mg | ORAL_TABLET | Freq: Every day | ORAL | Status: DC
  Administered 2022-10-02 – 2022-10-05 (×4): 15 mg via ORAL
  Filled 2022-10-02 (×5): qty 1

## 2022-10-02 NOTE — Progress Notes (Signed)
Subjective: 1 Day Post-Op Procedure(s) (LRB): INTRAMEDULLARY (IM) NAIL INTERTROCHANTERIC (Left) Patient reports pain as moderate.  Patient c/o dysphagia- feels as though something is stuck in throat and is having trouble swallowing liquids/solids.  Fears that she may choke.   Objective: Vital signs in last 24 hours: Temp:  [97.6 F (36.4 C)-98.7 F (37.1 C)] 97.6 F (36.4 C) (05/16 0734) Pulse Rate:  [80-97] 91 (05/16 0734) Resp:  [15-20] 17 (05/16 0734) BP: (103-132)/(45-89) 103/89 (05/16 0734) SpO2:  [94 %-100 %] 99 % (05/16 0734)  Intake/Output from previous day: 05/15 0701 - 05/16 0700 In: 520 [I.V.:200; Blood:320] Out: 900 [Urine:750; Blood:150] Intake/Output this shift: No intake/output data recorded.  Recent Labs    09/29/22 1135 10/01/22 0625 10/02/22 0128  HGB 11.2* 8.2* 8.9*   Recent Labs    10/01/22 0625 10/02/22 0128  WBC 14.8* 13.5*  RBC 2.57* 2.82*  HCT 25.2* 27.1*  PLT 129* 127*   Recent Labs    10/01/22 0625 10/02/22 0128  NA 129* 129*  K 4.8 6.0*  CL 98 97*  CO2 23 21*  BUN 30* 38*  CREATININE 1.05* 1.30*  GLUCOSE 144* 255*  CALCIUM 8.3* 8.0*   Recent Labs    09/29/22 1135  INR 1.4*    Neurologically intact Neurovascular intact Sensation intact distally Intact pulses distally Dorsiflexion/Plantar flexion intact Incision: dressing C/D/I No cellulitis present Compartment soft   Assessment/Plan: 1 Day Post-Op Procedure(s) (LRB): INTRAMEDULLARY (IM) NAIL INTERTROCHANTERIC (Left) Up with therapy WBAT LLE ABLA- mild and stable Patient c/o dysphagia- feels as though something is stuck in throat and is having trouble swallowing liquids/solids.  Fears that she may choke.  Will defer to primary team for further work-up D/c dispo per primary team (prefer CIR if at all possible) Ok to resume xarelto      Cristie Hem 10/02/2022, 8:09 AM

## 2022-10-02 NOTE — Progress Notes (Signed)
  Inpatient Rehab Admissions Coordinator :  Per Family request for CIR admission, patient was screened for CIR candidacy by Ottie Glazier RN MSN.  At this time patient appears to be a potential candidate for CIR. She would need 24/7 supervision even after a CIR admit which will need to be clarified if available. I will place a rehab consult per protocol for full assessment. Please call me with any questions.  Ottie Glazier RN MSN Admissions Coordinator [phone]

## 2022-10-02 NOTE — Progress Notes (Addendum)
PROGRESS NOTE    Amanda [name]  BMW:[id number] DOB: [birth year] DOA: 09/29/2022 PCP: Lupita Raider, MD     Brief Narrative:  Amanda [name] is a 89 y.o. female with a history of chronic atrial fibrillation, diabetes mellitus, hypothyroidism. Patient presented after a fall at home and subsequent pain preventing ambulation. Patient was found to have a left hip fracture. Orthopedic surgery consulted for surgical management.  Patient underwent intramedullary nail fixation of left hip by Dr. Roda Shutters 5/15.  New events last 24 hours / Subjective: Patient seen and daughter at bedside.  Patient has apparently been having some difficulty with swallowing especially clear liquid over the past several months.  Patient concerned about SCDs.  After my evaluation, I was notified by PT that patient's speech continues to be slurred. Per family, this is worsened since surgery.  I reevaluated patient at bedside.  Patient is alert and oriented, but does have some slurred speech.  Family at bedside states that she had a little bit of slurred speech after getting out of surgery, but has worsened today.  Also noted left-sided facial droop at rest, which is not her baseline.  Appears that the facial droop was noticed more this morning.  Last known normal was probably around 2 PM yesterday, before surgery.  Stroke RN presented at bedside and did assessment and spoke with neurology on-call.  Recommended for MRI brain and if positive, will ask for formal consultation.  SLP eval pending.  Assessment & Plan:   Principal Problem:   Displaced intertrochanteric fracture of left femur, initial encounter for closed fracture [facility]) Active Problems:   Hypothyroidism   History of CVA (cerebrovascular accident)   Permanent atrial fibrillation (HCC)   Diabetes mellitus type 2, controlled (HCC)   HTN (hypertension)   Hip fracture (HCC)   Restless leg syndrome   DNR (do not resuscitate)  Left hip fracture secondary to  mechanical fall -Orthopedic surgery consulted -S/p intramedullary nail fixation of left hip by Dr. Roda Shutters 5/15 -PT OT   Slurred speech, left facial droop -MRI brain  AKI on CKD stage IIIa -Baseline creatinine 1 -Hold lisinopril -IV fluid  Hyperkalemia -Would like to avoid Lokelma postoperatively.  Avoiding Lasix due to trending up creatinine.  Ordered albuterol, insulin, dextrose.  EKG was reviewed independently without any T wave changes -Repeat potassium level this afternoon  Chronic A-fib -Xarelto   Diabetes mellitus type 2 -SSI  Hypertension -Lisinopril  Hypothyroidism -Synthroid  RLS -Requip  CKD stage IIIa -Stable   DVT prophylaxis:  SCDs Start: 10/01/22 1829 Place TED hose Start: 10/01/22 1829 SCDs Start: 09/29/22 1516 Rivaroxaban (XARELTO) tablet 15 mg  Code Status: DNR Family Communication: Family at bedside Disposition Plan: Pending postop course, may need SNF versus home health Status is: Inpatient Remains inpatient appropriate because: OR today    Antimicrobials:  Anti-infectives (From admission, onward)    Start     Dose/Rate Route Frequency Ordered Stop   10/01/22 2000  ceFAZolin (ANCEF) IVPB 2g/100 mL premix        2 g 200 mL/hr over 30 Minutes Intravenous Every 6 hours 10/01/22 1828 10/02/22 0903        Objective: Vitals:   10/01/22 1930 10/02/22 0359 10/02/22 0734 10/02/22 1146  BP: 122/62 (!) 116/45 103/89   Pulse: 96 89 91   Resp: 18 18 17    Temp: 98.1 F (36.7 C) 98.2 F (36.8 C) 97.6 F (36.4 C)   TempSrc: Oral Oral Oral   SpO2: 95% 96% 99% 97%  Weight:      Height:        Intake/Output Summary (Last 24 hours) at 10/02/2022 1216 Last data filed at 10/02/2022 0600 Gross per 24 hour  Intake 520 ml  Output 500 ml  Net 20 ml    Filed Weights   09/29/22 1127  Weight: 68.9 kg    Examination:  General exam: Appears calm and comfortable  Respiratory system: Clear to auscultation. Respiratory effort normal. No  respiratory distress. No conversational dyspnea.  Cardiovascular system: S1 & S2 heard Gastrointestinal system: Abdomen is nondistended, soft and nontender. Normal bowel sounds heard. Central nervous system: Alert  Extremities: Symmetric in appearance  Skin: No rashes, lesions or ulcers on exposed skin    Data Reviewed: I have personally reviewed following labs and imaging studies  CBC: Recent Labs  Lab 09/29/22 1135 10/01/22 0625 10/02/22 0128  WBC 9.2 14.8* 13.5*  NEUTROABS 7.0  --   --   HGB 11.2* 8.2* 8.9*  HCT 35.7* 25.2* 27.1*  MCV 103.2* 98.1 96.1  PLT 190 129* 127*    Basic Metabolic Panel: Recent Labs  Lab 09/29/22 1135 10/01/22 0625 10/02/22 0128 10/02/22 0812  NA 132* 129* 129* 130*  K 4.7 4.8 6.0* 5.5*  CL 100 98 97* 97*  CO2 22 23 21* 23  GLUCOSE 246* 144* 255* 231*  BUN 20 30* 38* 40*  CREATININE 0.92 1.05* 1.30* 1.40*  CALCIUM 8.7* 8.3* 8.0* 8.1*    GFR: Estimated Creatinine Clearance: 18.9 mL/min (A) (by C-G formula based on SCr of 1.4 mg/dL (H)). Liver Function Tests: No results for input(s): "AST", "ALT", "ALKPHOS", "BILITOT", "PROT", "ALBUMIN" in the last 168 hours. No results for input(s): "LIPASE", "AMYLASE" in the last 168 hours. No results for input(s): "AMMONIA" in the last 168 hours. Coagulation Profile: Recent Labs  Lab 09/29/22 1135  INR 1.4*    Cardiac Enzymes: No results for input(s): "CKTOTAL", "CKMB", "CKMBINDEX", "TROPONINI" in the last 168 hours. BNP (last 3 results) No results for input(s): "PROBNP" in the last 8760 hours. HbA1C: No results for input(s): "HGBA1C" in the last 72 hours. CBG: Recent Labs  Lab 10/01/22 1527 10/01/22 1612 10/01/22 2010 10/02/22 0731 10/02/22 1118  GLUCAP 122* 128* 189* 201* 218*    Lipid Profile: No results for input(s): "CHOL", "HDL", "LDLCALC", "TRIG", "CHOLHDL", "LDLDIRECT" in the last 72 hours. Thyroid Function Tests: No results for input(s): "TSH", "T4TOTAL", "FREET4",  "T3FREE", "THYROIDAB" in the last 72 hours. Anemia Panel: No results for input(s): "VITAMINB12", "FOLATE", "FERRITIN", "TIBC", "IRON", "RETICCTPCT" in the last 72 hours. Sepsis Labs: No results for input(s): "PROCALCITON", "LATICACIDVEN" in the last 168 hours.  Recent Results (from the past 240 hour(s))  Surgical pcr screen     Status: None   Collection Time: 09/30/22  5:40 PM   Specimen: Nasal Mucosa; Nasal Swab  Result Value Ref Range Status   MRSA, PCR NEGATIVE NEGATIVE Final   Staphylococcus aureus NEGATIVE NEGATIVE Final    Comment: (NOTE) The Xpert SA Assay (FDA approved for NASAL specimens in patients 51 years of age and older), is one component of a comprehensive surveillance program. It is not intended to diagnose infection nor to guide or monitor treatment. Performed at [facility], [address] [place], [place] [address]       Radiology Studies: DG FEMUR MIN 2 VIEWS LEFT  Result Date: 10/01/2022 CLINICAL DATA:  [id number] Surgery, elective [id number] EXAM: LEFT FEMUR 2 VIEWS COMPARISON:  09/29/2022 FINDINGS: Intraoperative images during left femur intramedullary  nailing for an intertrochanteric fracture. Improved fracture alignment. Intact hardware without evidence of loosening. IMPRESSION: Intraoperative images during left femur ORIF. No evidence of immediate hardware complication. Improved intertrochanteric fracture alignment. Electronically Signed   By: Caprice Renshaw M.D.   On: 10/01/2022 16:14   DG C-Arm 1-60 Min-No Report  Result Date: 10/01/2022 Fluoroscopy was utilized by the requesting physician.  No radiographic interpretation.      Scheduled Meds:  acetaminophen  1,000 mg Oral Q6H   Chlorhexidine Gluconate Cloth  6 each Topical Daily   docusate sodium  100 mg Oral BID   insulin aspart  0-9 Units Subcutaneous TID WC   levothyroxine  88 mcg Oral Q0600   loratadine  10 mg Oral Daily   rivaroxaban  15 mg Oral Q supper   rOPINIRole  0.25 mg Oral QHS    Continuous Infusions:  sodium chloride 100 mL/hr at 10/02/22 1035   methocarbamol (ROBAXIN) IV       LOS: 3 days   Time spent: 25 minutes   Noralee Stain, DO [facility] 10/02/2022, 12:16 PM   Available via Epic secure chat 7am-7pm After these hours, please refer to coverage provider listed on [email or web address]

## 2022-10-02 NOTE — Progress Notes (Signed)
SLP at the bedside to complete Swallow Screen.   Bedside Swallow Screen completed and patient failed.

## 2022-10-02 NOTE — Consult Note (Signed)
NEURO HOSPITALIST CONSULT NOTE   Requestig physician: Dr. Alvino Chapel  Reason for Consult: Acute infarctions seen on MRI  History obtained from:  Chart     HPI:                                                                                                                                          Amanda [name] is an 89 y.o. female with a history of atrial fibrillation (on Xarelto), DM and hypothyroidism who was admitted to the hospital on 5/13 after a fall sustaining a left hip fracture. A medial malleolus ankle fracture was also noted on imaging for which no surgical treatment was warranted. Xarelto was held pre-operatively. On 5/15 she underwent an  open treatment of intertrochanteric, pertrochanteric and subtrochanteric fracture with intramedullary implant. On Thursday afternoon, new slurred speech and facial droop were noted; on review of her post-surgery course, slurred speech had also been noted after surgery on 5/15, which had been attributed to anesthesia. The facial droop noted Thursday afternoon was new. LKN was 1433 on 5/15. She was not a TNK candidate due to recent major surgery and time criteria.   An MRI was then obtained, revealing a total of two small deep white matter acute ischemic infarctions bilaterally.   At baseline she ambulates with a walker at home and is conversational, but has a caregiver at her home.   Past Medical History:  Diagnosis Date   Carpal tunnel syndrome    Chronic anticoagulation    Chronic atrial fibrillation (HCC)    Diabetes mellitus    NON-INSULIN   Hypothyroidism    Sinus congestion     Past Surgical History:  Procedure Laterality Date   APPENDECTOMY     CARDIOVASCULAR STRESS TEST  07/10/2004   EF 81%   CHOLECYSTECTOMY     INTRAMEDULLARY (IM) NAIL INTERTROCHANTERIC Left 10/01/2022   Procedure: INTRAMEDULLARY (IM) NAIL INTERTROCHANTERIC;  Surgeon: Tarry Kos, MD;  Location: [facility] OR;  Service: Orthopedics;  Laterality:  Left;   US ECHOCARDIOGRAPHY  12/06/2007   EF 55-60%    Family History  Problem Relation Age of Onset   Stroke Mother    Hypertension Mother    Diabetes Sister    Hypertension Other    Heart attack Neg Hx              Social History:  reports that she has never smoked. She has never used smokeless tobacco. She reports that she does not drink alcohol and does not use drugs.  Allergies  Allergen Reactions   Calcium-Vitamin D [Calcium Carb-Ergocalciferol] Other (See Comments)    Constipation    Tape Other (See Comments)    Skin very thin, tears easily   Codeine Nausea Only   Hydrocodone Nausea Only  Metoprolol Nausea Only   Rythmol [Propafenone Hcl] Other (See Comments)    Dizziness    Shellfish Allergy Itching and Rash    Mussels only.   Sulfa Antibiotics Nausea Only    MEDICATIONS:                                                                                                                     Prior to Admission:  Medications Prior to Admission  Medication Sig Dispense Refill Last Dose   acetaminophen (TYLENOL) 500 MG tablet Take 1,000 mg by mouth every 6 (six) hours as needed (for pain).      Cholecalciferol (VITAMIN D-3 PO) Take 1 capsule by mouth daily.      Cyanocobalamin (VITAMIN B-12 PO) Take 1 tablet by mouth daily.      ferrous sulfate 325 (65 FE) MG EC tablet Take 1 tablet (325 mg total) by mouth 2 (two) times daily. 60 tablet 0    furosemide (LASIX) 40 MG tablet Take 1 tablet (40 mg total) by mouth daily as needed for fluid or edema. 30 tablet 0    glimepiride (AMARYL) 1 MG tablet Take 1.5 mg by mouth daily with breakfast.       Homeopathic Products ([employer] ARNICA) CREA Apply 1 application  topically 2 (two) times daily as needed (leg pains).      levothyroxine (SYNTHROID, LEVOTHROID) 88 MCG tablet Take 88 mcg by mouth daily before breakfast.      lisinopril (ZESTRIL) 20 MG tablet Take 20 mg by mouth daily.      loratadine (CLARITIN) 10 MG tablet Take 10 mg by  mouth daily. [employer] brand - Allerclear      MAGNESIUM OXIDE PO Take 1 tablet by mouth every evening.      meclizine (ANTIVERT) 25 MG tablet Take one every 6-8 hours as needed for dizziness (Patient taking differently: Take 25 mg by mouth daily as needed for dizziness.) 20 tablet 0    Multiple Vitamins-Minerals (PRESERVISION AREDS 2 PO) Take 1 capsule by mouth 2 (two) times daily.       Niacin (VITAMIN B-3 PO) Take 1 tablet by mouth daily.      Omega-3 Fatty Acids (FISH OIL PO) Take 1 capsule by mouth every evening.      Probiotic Product (PROBIOTIC DAILY PO) Take 2 capsules by mouth daily as needed (digestion).      rOPINIRole (REQUIP) 0.25 MG tablet Take 0.25 mg by mouth See admin instructions. 0.25 mg around lunch time, 0.25 mg at bedtime around 2230      sodium chloride (OCEAN) 0.65 % nasal spray Place 1 spray into the nose daily as needed (nasal congestion).      Wheat Dextrin (BENEFIBER) POWD Take 1 Scoop by mouth daily.      XARELTO 15 MG TABS tablet TAKE 1 TABLET(15 MG) BY MOUTH DAILY WITH SUPPER (Patient taking differently: Take 15 mg by mouth daily with supper.) 30 tablet 5    Scheduled:  Chlorhexidine Gluconate Cloth  6  each Topical Daily   docusate sodium  100 mg Oral BID   insulin aspart  0-9 Units Subcutaneous TID WC   levothyroxine  88 mcg Oral Q0600   loratadine  10 mg Oral Daily   rivaroxaban  15 mg Oral Q supper   rOPINIRole  0.25 mg Oral QHS   Continuous:  sodium chloride 100 mL/hr at 10/02/22 1035   methocarbamol (ROBAXIN) IV     promethazine (PHENERGAN) injection (IM or IVPB) 12.5 mg (10/02/22 2130)     ROS:                                                                                                                                       Unable to obtain due to somnolence.    Blood pressure 112/75, pulse 99, temperature 97.6 F (36.4 C), temperature source Oral, resp. rate 18, height 5\' 3"  (1.6 m), weight 68.9 kg, SpO2 99 %.   General Examination:                                                                                                        Physical Exam  HEENT-  Normocephalic    Lungs- Respirations unlabored Extremities- Warm and well perfused  Neurological Examination Mental Status: Somnolent. Will briefly arouse and open eyes after sustained noxious stimuli. Not cooperative with exam. Murmurs softly but otherwise without verbal output. Not following any commands. Cranial Nerves: II: PERRL  III,IV, VI: Eyes are conjugate without forced deviation. Does not fixate or track.   V: Reacts to touch stimuli bilaterally VII: Does not grimace to noxious or smile to command. No gross asymmetry while at rest. VIII: Unable to assess IX,X: Gag reflex deferred XI: Head is midline XII: Not following commands for assessment Motor/Sensory: Tone is normal in BUE. Reacts to pinch stimuli bilaterally. Arms drift to bed after passive elevation and release. No gross asymmetry of upper extremity motor function in this context.  Withdraws BLE to plantar stimulation, right slightly more briskly than left.   Deep Tendon Reflexes: 1+ and symmetric brachioradialis. Deferred patellars due to recent surgery.  Cerebellar/Gait: Unable to assess   Lab Results: Basic Metabolic Panel: Recent Labs  Lab 09/29/22 1135 10/01/22 0625 10/02/22 0128 10/02/22 0812 10/02/22 1317  NA 132* 129* 129* 130*  --   K 4.7 4.8 6.0* 5.5* 4.7  CL 100 98 97* 97*  --   CO2 22 23 21* 23  --   GLUCOSE 246* 144* 255* 231*  --  BUN 20 30* 38* 40*  --   CREATININE 0.92 1.05* 1.30* 1.40*  --   CALCIUM 8.7* 8.3* 8.0* 8.1*  --     CBC: Recent Labs  Lab 09/29/22 1135 10/01/22 0625 10/02/22 0128  WBC 9.2 14.8* 13.5*  NEUTROABS 7.0  --   --   HGB 11.2* 8.2* 8.9*  HCT 35.7* 25.2* 27.1*  MCV 103.2* 98.1 96.1  PLT 190 129* 127*    Cardiac Enzymes: No results for input(s): "CKTOTAL", "CKMB", "CKMBINDEX", "TROPONINI" in the last 168 hours.  Lipid Panel: No results for  input(s): "CHOL", "TRIG", "HDL", "CHOLHDL", "VLDL", "LDLCALC" in the last 168 hours.  Imaging: MR BRAIN WO CONTRAST  Result Date: 10/02/2022 CLINICAL DATA:  Neuro deficit with acute stroke suspected EXAM: MRI HEAD WITHOUT CONTRAST TECHNIQUE: Multiplanar, multiecho pulse sequences of the brain and surrounding structures were obtained without intravenous contrast. COMPARISON:  Head CT from 3 days ago FINDINGS: Brain: Small acute infarcts in the bilateral corona radiata. Although that bilateral and somewhat symmetric in size and location these are not at the basal ganglia to imply a global/toxic cause. There is chronic small vessel ischemia in the cerebral white matter to a mild degree for age. No acute hemorrhage, hydrocephalus, or collection. Cerebral volume loss that is mild for age. Few chronic microhemorrhages mainly in the deep brain , likely microvascular ischemic. Vascular: Normal flow voids. Skull and upper cervical spine: Normal marrow signal. Sinuses/Orbits: Negative. IMPRESSION: 1. Acute lacunar infarct in the bilateral corona radiata. 2. Generalized atrophy and chronic small vessel ischemia. Electronically Signed   By: Tiburcio Pea M.D.   On: 10/02/2022 19:27   DG FEMUR MIN 2 VIEWS LEFT  Result Date: 10/01/2022 CLINICAL DATA:  [id number] Surgery, elective [id number] EXAM: LEFT FEMUR 2 VIEWS COMPARISON:  09/29/2022 FINDINGS: Intraoperative images during left femur intramedullary nailing for an intertrochanteric fracture. Improved fracture alignment. Intact hardware without evidence of loosening. IMPRESSION: Intraoperative images during left femur ORIF. No evidence of immediate hardware complication. Improved intertrochanteric fracture alignment. Electronically Signed   By: Caprice Renshaw M.D.   On: 10/01/2022 16:14   DG C-Arm 1-60 Min-No Report  Result Date: 10/01/2022 Fluoroscopy was utilized by the requesting physician.  No radiographic interpretation.     Assessment: 89 year old female with  small bilateral acute ischemic strokes in the deep white matter, most likely sustained perioperatively.  - Exam at 1 AM Friday reveals a somnolent patient who is uncooperative with motor and sensory testing, and who does not answer any questions.  - MRI brain: Small acute infarcts in the bilateral corona radiata. Although bilateral and somewhat symmetric in size and location these are not at the basal ganglia to imply a global/toxic cause. There is chronic small vessel ischemia in the cerebral white matter to a mild degree for age. Cerebral volume loss that is mild for age. Few chronic microhemorrhages mainly in the deep brain.  - Her acute strokes were most likely a perioperative complication. DDx includes fat emboli and cardioembolic strokes due to having been off anticoagulation.   Recommendations: - Safe to restart anticoagulation as her acute infarctions are small with low risk of hemorrhagic conversion.  - TTE - Unclear if vascular imaging of her head and neck would change management. Hold off on neck/head vascular imaging for now.  - PT/OT/Speech - BP management. Out of the permissive HTN time window.  - Cardiac telemetry - Stroke Team to follow in the AM.    Electronically signed: Dr. Caryl Pina 10/02/2022,  8:55 PM

## 2022-10-02 NOTE — TOC Initial Note (Signed)
Transition of Care [facility]) - Initial/Assessment Note    Patient Details  Name: Amanda [name] MRN: [id number] Date of Birth: 01-01-1919  Transition of Care [facility]) CM/SW Contact:    Lorri Frederick, LCSW Phone Number: 10/02/2022, 4:11 PM  Clinical Narrative:      CSW met with pt son Chanetta Marshall to discuss DC recommendation for SNF.  Pt asleep, just medicated.  Son reports pt from home alone.  Has HH aide 1x per week and functions quite well.  Jimmy and the other children are requesting pt be evaluated for CIR.  If this is not an option, they will consider SNF.  CSW will contact CIR admit team.  Medicare choice document also provided for SNF.             Expected Discharge Plan: Skilled Nursing Facility Barriers to Discharge: Continued Medical Work up   Patient Goals and CMS Choice     Choice offered to / list presented to : Adult Children (son Chanetta Marshall)      Expected Discharge Plan and Services In-house Referral: Clinical Social Work   Post Acute Care Choice: IP Rehab Living arrangements for the past 2 months: Single Family Home                                      Prior Living Arrangements/Services Living arrangements for the past 2 months: Single Family Home Lives with:: Self Patient language and need for interpreter reviewed:: No        Need for Family Participation in Patient Care: Yes (Comment) Care giver support system in place?: Yes (comment) Current home services: Homehealth aide (1x week) Criminal Activity/Legal Involvement Pertinent to Current Situation/Hospitalization: No - Comment as needed  Activities of Daily Living Home Assistive Devices/Equipment: Cane (specify quad or straight), Walker (specify type) ADL Screening (condition at time of admission) Patient's cognitive ability adequate to safely complete daily activities?: Yes Is the patient deaf or have difficulty hearing?: Yes Does the patient have difficulty seeing, even when wearing  glasses/contacts?: No Does the patient have difficulty concentrating, remembering, or making decisions?: No Patient able to express need for assistance with ADLs?: Yes Does the patient have difficulty dressing or bathing?: No Independently performs ADLs?: No Communication: Independent Dressing (OT): Needs assistance Is this a change from baseline?: Change from baseline, expected to last >3 days Grooming: Needs assistance Is this a change from baseline?: Change from baseline, expected to last >3 days Feeding: Independent Bathing: Needs assistance Is this a change from baseline?: Change from baseline, expected to last >3 days Toileting: Needs assistance Is this a change from baseline?: Change from baseline, expected to last >3days In/Out Bed: Needs assistance Is this a change from baseline?: Change from baseline, expected to last >3 days Walks in Home: Needs assistance Is this a change from baseline?: Change from baseline, expected to last >3 days Does the patient have difficulty walking or climbing stairs?: Yes Weakness of Legs: Both Weakness of Arms/Hands: None  Permission Sought/Granted                  Emotional Assessment Appearance:: Appears stated age Attitude/Demeanor/Rapport: Other (comment), Unable to Assess (asleep, just medicated) Affect (typically observed): Unable to Assess Orientation: : Oriented to Self, Oriented to Place, Oriented to  Time, Oriented to Situation      Admission diagnosis:  Hip fracture (HCC) [S72.009A] Fall, initial encounter [W19.XXXA] Displaced intertrochanteric fracture of  left femur, initial encounter for closed fracture [facility]) [S72.142A] Patient Active Problem List   Diagnosis Date Noted   Displaced intertrochanteric fracture of left femur, initial encounter for closed fracture (HCC) 10/01/2022   Hip fracture (HCC) 09/29/2022   Restless leg syndrome 09/29/2022   DNR (do not resuscitate) 09/29/2022   Pulmonary edema 03/18/2022    Normocytic anemia 03/18/2022   Acute respiratory failure with hypoxia (HCC) 03/18/2022   Chronic a-fib (HCC) 01/05/2019   Dysarthria 01/02/2019   Aphasia 01/01/2019   HTN (hypertension) 01/01/2019   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 09/28/2015   Anemia 09/28/2015   Vertigo    Chronic anticoagulation 05/24/2014   History of CVA (cerebrovascular accident) 05/23/2014   Permanent atrial fibrillation (HCC) 05/23/2014   Diabetes mellitus type 2, controlled (HCC) 05/23/2014   Slurred speech    Encounter for therapeutic drug monitoring 08/19/2013   Benign hypertensive heart disease without heart failure 10/09/2010   Hypothyroidism 10/09/2010   Atrial fibrillation (HCC) 09/03/2010   PCP:  Lupita Raider, MD Pharmacy:   [facility] [phone] [place], [place] - [address] AT [facility] OF [address] & [facility] [address] [place] [address] Phone: [phone] Fax: [phone]     Social Determinants of Health (SDOH) Social History: SDOH Screenings   Food Insecurity: No Food Insecurity (09/29/2022)  Housing: Low Risk  (09/29/2022)  Transportation Needs: No Transportation Needs (09/29/2022)  Utilities: Not At Risk (09/29/2022)  Tobacco Use: Low Risk  (10/01/2022)   SDOH Interventions:     Readmission Risk Interventions     No data to display

## 2022-10-02 NOTE — Evaluation (Signed)
Occupational Therapy Evaluation Patient Details Name: Amanda [name] MRN: [id number] DOB: Jan 07, 1919 Today's Date: 10/02/2022   History of Present Illness Amanda [name] is a 89 y.o. female s/p L hip fx with intramedullary implant. Pt has history of chronic atrial fibrillation, diabetes mellitus, hypothyroidism. Patient presented after a fall at home and subsequent pain preventing ambulation.   Clinical Impression   Pt s/p above diagnosis. PT joined session for safety/physical assistance mid session, Pt's sons/daughter present during session. Pt PLOF mod I at home with use of rollator, lives alone in split level house. Pt currently requires significant assistance for transfers, bed mobility, and ADLs. Pt does not have support at home, split level home requires frequent ability to climb stairs to get to bed/bath. Pt would benfit greatly from post acute rehab <3 hours/day to improve functional independence, will be seen acutely during stay to improve function.     Recommendations for follow up therapy are one component of a multi-disciplinary discharge planning process, led by the attending physician.  Recommendations may be updated based on patient status, additional functional criteria and insurance authorization.   Assistance Recommended at Discharge Frequent or constant Supervision/Assistance  Patient can return home with the following A lot of help with walking and/or transfers;A lot of help with bathing/dressing/bathroom;Assistance with cooking/housework;Assistance with feeding;Assist for transportation;Help with stairs or ramp for entrance    Functional Status Assessment  Patient has had a recent decline in their functional status and demonstrates the ability to make significant improvements in function in a reasonable and predictable amount of time.  Equipment Recommendations  Other (comment) (defer)    Recommendations for Other Services       Precautions / Restrictions  Precautions Precautions: Fall Restrictions Weight Bearing Restrictions: Yes LLE Weight Bearing: Weight bearing as tolerated      Mobility Bed Mobility Overal bed mobility: Needs Assistance Bed Mobility: Supine to Sit     Supine to sit: Max assist, +2 for physical assistance, HOB elevated     General bed mobility comments: max A x2 for supine to sit on EOB, HH assistance and help with LLE    Transfers Overall transfer level: Needs assistance Equipment used: Rolling walker (2 wheels), 2 person hand held assist Transfers: Sit to/from Stand, Bed to chair/wheelchair/BSC Sit to Stand: Max assist, +2 physical assistance, From elevated surface     Step pivot transfers: Max assist, +2 physical assistance, From elevated surface     General transfer comment: max A for STS, block RLE due to sliding out from under her, Pt unable to stand fully upright, max A for pivoting into chair.      Balance Overall balance assessment: Needs assistance Sitting-balance support: Bilateral upper extremity supported, Feet supported Sitting balance-Leahy Scale: Poor Sitting balance - Comments: posterior lean, consistant HH assistance, eventually was able to sit with min guard holding to EOB for a couple of minutes Postural control: Posterior lean, Right lateral lean Standing balance support: Bilateral upper extremity supported, During functional activity, Reliant on assistive device for balance Standing balance-Leahy Scale: Poor Standing balance comment: able to stand fully erect, max A x2 for assist, RLE slips out in front of her when standing, posterior leaning                           ADL either performed or assessed with clinical judgement   ADL Overall ADL's : Needs assistance/impaired Eating/Feeding: NPO Eating/Feeding Details (indicate cue type and reason): until speech  eval due to feeling like food gets stuck when eating Grooming: Set up;Sitting   Upper Body Bathing:  Sitting;Moderate assistance Upper Body Bathing Details (indicate cue type and reason): poor sitting balance Lower Body Bathing: Total assistance;+2 for physical assistance;Sitting/lateral leans   Upper Body Dressing : Moderate assistance;Sitting   Lower Body Dressing: Total assistance;+2 for physical assistance;Sit to/from stand   Toilet Transfer: Maximal assistance;+2 for physical assistance;Rolling walker (2 wheels)   Toileting- Clothing Manipulation and Hygiene: Maximal assistance;+2 for physical assistance;Sit to/from stand       Functional mobility during ADLs: Maximal assistance;+2 for physical assistance;Rolling walker (2 wheels) General ADL Comments: Pt requires significant assistance with sitting on on EOB, LB ADLs, and transfers. Poor balance with standing, x2 for physical assistance and safety     Vision Baseline Vision/History: 1 Wears glasses Ability to See in Adequate Light: 0 Adequate Patient Visual Report: No change from baseline       Perception     Praxis      Pertinent Vitals/Pain Pain Assessment Pain Assessment: 0-10 Pain Score: 10-Worst pain ever Pain Location: L hip Pain Descriptors / Indicators: Aching, Constant, Grimacing Pain Intervention(s): Limited activity within patient's tolerance, Monitored during session     Hand Dominance     Extremity/Trunk Assessment Upper Extremity Assessment Upper Extremity Assessment: Overall WFL for tasks assessed   Lower Extremity Assessment Lower Extremity Assessment: Defer to PT evaluation       Communication Communication Communication: HOH   Cognition Arousal/Alertness: Awake/alert Behavior During Therapy: WFL for tasks assessed/performed Overall Cognitive Status: Within Functional Limits for tasks assessed                                 General Comments: Pt did have some slurring of speech noticed by PT, family said it was new, referred to speech     General Comments        Exercises     Shoulder Instructions      Home Living Family/patient expects to be discharged to:: Private residence Living Arrangements: Alone Available Help at Discharge: Family;Available PRN/intermittently Type of Home: House Home Access: Stairs to enter Entrance Stairs-Number of Steps: 2+3 Entrance Stairs-Rails: None Home Layout: Two level Alternate Level Stairs-Number of Steps: 3 steps Alternate Level Stairs-Rails: Right Bathroom Shower/Tub: Tub/shower unit;Walk-in shower   Bathroom Toilet: Standard Bathroom Accessibility: Yes How Accessible: Accessible via walker Home Equipment: Rollator (4 wheels);Cane - single point;BSC/3in1   Additional Comments: Pt lives alone in split level house, 3 stairs up/down stairs with one side railing. Pt uses rollator and cane in home.      Prior Functioning/Environment Prior Level of Function : Independent/Modified Independent             Mobility Comments: She used a rollator inside the home ADLs Comments: mod I        OT Problem List: Decreased strength;Decreased range of motion;Decreased activity tolerance;Impaired balance (sitting and/or standing);Decreased safety awareness;Pain      OT Treatment/Interventions: Self-care/ADL training;Therapeutic exercise;Energy conservation;DME and/or AE instruction;Therapeutic activities;Patient/family education    OT Goals(Current goals can be found in the care plan section) Acute Rehab OT Goals Patient Stated Goal: to improve strength and return home OT Goal Formulation: With patient/family Time For Goal Achievement: 10/16/22 Potential to Achieve Goals: Good  OT Frequency: Min 2X/week    Co-evaluation PT/OT/SLP Co-Evaluation/Treatment: Yes Reason for Co-Treatment: For patient/therapist safety;To address functional/ADL transfers   OT goals addressed during  session: ADL's and self-care;Proper use of Adaptive equipment and DME      AM-PAC OT "6 Clicks" Daily Activity     Outcome  Measure Help from another person eating meals?: Total Help from another person taking care of personal grooming?: A Little Help from another person toileting, which includes using toliet, bedpan, or urinal?: A Lot Help from another person bathing (including washing, rinsing, drying)?: A Lot Help from another person to put on and taking off regular upper body clothing?: A Little Help from another person to put on and taking off regular lower body clothing?: A Lot 6 Click Score: 13   End of Session Equipment Utilized During Treatment: Gait belt;Rolling walker (2 wheels) Nurse Communication: Mobility status  Activity Tolerance: Patient limited by pain Patient left: in chair;with call bell/phone within reach;with family/visitor present  OT Visit Diagnosis: Unsteadiness on feet (R26.81);Other abnormalities of gait and mobility (R26.89);Muscle weakness (generalized) (M62.81);History of falling (Z91.81);Pain Pain - Right/Left: Left Pain - part of body: Hip                Time: 1100-1153 OT Time Calculation (min): 53 min Charges:  OT General Charges $OT Visit: 1 Visit OT Evaluation $OT Eval Moderate Complexity: 1 Mod OT Treatments $Self Care/Home Management : 8-22 mins $Therapeutic Activity: 8-22 mins  Olesya Wike, OTR/L   Alexis Goodell 10/02/2022, 12:15 PM

## 2022-10-02 NOTE — Evaluation (Signed)
Clinical/Bedside Swallow Evaluation Patient Details  Name: Amanda [name] MRN: [id number] Date of Birth: December 23, 1918  Today's Date: 10/02/2022 Time: SLP Start Time (ACUTE ONLY): 1315 SLP Stop Time (ACUTE ONLY): 1348 SLP Time Calculation (min) (ACUTE ONLY): 33 min  Past Medical History:  Past Medical History:  Diagnosis Date   Carpal tunnel syndrome    Chronic anticoagulation    Chronic atrial fibrillation (HCC)    Diabetes mellitus    NON-INSULIN   Hypothyroidism    Sinus congestion    Past Surgical History:  Past Surgical History:  Procedure Laterality Date   APPENDECTOMY     CARDIOVASCULAR STRESS TEST  07/10/2004   EF 81%   CHOLECYSTECTOMY     US ECHOCARDIOGRAPHY  12/06/2007   EF 55-60%   HPI:  Pt is a 89 year old female admitted after a fall and subsequent pain preventing ambulation. Patient was found to have a left hip fracture. Underwnt surgery on 5/15. Reported feeling of food and liquid in throat. Family reports this has been ongoing for a while. She is afraid of choking.    Assessment / Plan / Recommendation  Clinical Impression  Pt currently suspected of acute/subacute CVA with imaging pending. Pt observed to ahve left facial weakness at rest and decreased lingual tension for speech. Prior to code stroke pt reported some difficulty swallowing to family and RN and SLP eval in place. Pt both failed the 3 oz water swallow and showed delayed coughing with small sips of water. She also endorsed a feeling of pills getting stuck or something choking her when taking pills with water. She denied food feeling stuck. SLP offered trials of nectar thick water without coughing. Also able to take small bites of magic cup. Pt has no dentures. Recommend pt be allowed sips of nectar thick liquids and bites of pudding/puree. Will start Dys1/nectar diet. Will f/u with [facility] tomorrow. Family present and in agreement.  Demonstrated thickening to family.  SLP Visit Diagnosis: Dysphagia,  oropharyngeal phase (R13.12)    Aspiration Risk  Moderate aspiration risk    Diet Recommendation Dysphagia 1 (Puree);Nectar-thick liquid   Liquid Administration via: Cup;Straw Medication Administration: Crushed with puree Supervision: Staff to assist with self feeding Compensations: Slow rate;Small sips/bites Postural Changes: Seated upright at 90 degrees;Remain upright for at least 30 minutes after po intake    Other  Recommendations Oral Care Recommendations: Oral care BID    Recommendations for follow up therapy are one component of a multi-disciplinary discharge planning process, led by the attending physician.  Recommendations may be updated based on patient status, additional functional criteria and insurance authorization.  Follow up Recommendations Acute inpatient rehab (3hours/day)      Assistance Recommended at Discharge    Functional Status Assessment Patient has had a recent decline in their functional status and demonstrates the ability to make significant improvements in function in a reasonable and predictable amount of time.  Frequency and Duration min 2x/week  2 weeks       Prognosis Prognosis for improved oropharyngeal function: Good      Swallow Study   General HPI: Pt is a 89 year old female admitted after a fall and subsequent pain preventing ambulation. Patient was found to have a left hip fracture. Underwnt surgery on 5/15. Reported feeling of food and liquid in throat. Family reports this has been ongoing for a while. She is afraid of choking. Type of Study: Bedside Swallow Evaluation Previous Swallow Assessment: none Diet Prior to this Study: NPO Temperature  Spikes Noted: No Respiratory Status: Room air History of Recent Intubation: No Behavior/Cognition: Alert;Cooperative;Pleasant mood Oral Cavity Assessment: Within Functional Limits Oral Care Completed by SLP: No Oral Cavity - Dentition: Edentulous Vision: Functional for  self-feeding Self-Feeding Abilities: Able to feed self Patient Positioning: Upright in chair Baseline Vocal Quality: Normal Volitional Cough: Strong Volitional Swallow: Able to elicit    Oral/Motor/Sensory Function Overall Oral Motor/Sensory Function: Mild impairment Facial ROM: Within Functional Limits Facial Symmetry: Other (Comment);Suspected CN VII (facial) dysfunction;Abnormal symmetry left Facial Strength: Reduced left Facial Sensation: Within Functional Limits Lingual ROM: Within Functional Limits Lingual Symmetry: Within Functional Limits Lingual Strength: Reduced;Suspected CN XII (hypoglossal) dysfunction   Ice Chips Ice chips: Not tested   Thin Liquid Thin Liquid: Impaired Presentation: Cup;Straw;Self Fed Pharyngeal  Phase Impairments: Cough - Immediate;Cough - Delayed    Nectar Thick Nectar Thick Liquid: Within functional limits Presentation: Cup;Straw;Self Fed   Honey Thick Honey Thick Liquid: Not tested   Puree Puree: Within functional limits Presentation: Spoon   Solid     Solid: Not tested      Claudine Mouton 10/02/2022,2:37 PM

## 2022-10-02 NOTE — Plan of Care (Signed)

## 2022-10-02 NOTE — Progress Notes (Signed)
MEDICATION-RELATED CONSULT NOTE   Anticoagulant/Antiplatelet PTA/Inpatient Med List Review by Pharmacist   Procedure: open txt of intertrochanteric, pertrochanteric, subtrochanteric fracture w/ intramedullary implant    Completed:   10/01/22  Antithrombotic medications on inpatient or PTA profile prior to procedure:  rivaroxaban 15mg  daily  Recommended restart time per ortho consult: POD1 (5/16) if hgb >/= 8     Plan:     Resume rivaroxaban 15mg  daily  Rexford Maus, PharmD, BCPS 10/02/2022 7:53 AM

## 2022-10-02 NOTE — Progress Notes (Signed)
Called at 1243 by [facility] with reports of patient having slurred speech and left facial droop.   Patient is admitted for left hip replacement and reports she was at her baseline when she was induced at 1433 yesterday. When she came out of surgery, they noted that her speech was slightly slurred, but attributed it to Anesthesia. Today, the patient has remained dysarthric with a new left sided facial droop.   LKW > 4.5 hours and patient is VAN Negative.   Neurologist Bhagat called and verbalized agreement. Recommendations of MRI and consult neurology if MRI positive. Hold Xarelto until MRI results.   Spoke with Dr. Alvino Chapel about recommendations.   Donny Pique, RN  [facility]

## 2022-10-02 NOTE — Evaluation (Signed)
Physical Therapy Evaluation Patient Details Name: Amanda [name] MRN: [id number] DOB: May 11, 1919 Today's Date: 10/02/2022  History of Present Illness  Pt is a 89 y/o F admitted on 09/29/22 after presenting with c/o fall & inability to ambulate. Pt found to have L pertrochanteric hip fx. Pt is s/p L IM nail on 10/01/22. Pt also noted to have small R medial malleolus ankle fx not requiring surgical attention. PMH: chronic a-fib, DM, hypothyroidism  Clinical Impression  Pt seen for PT evaluation with co-tx for pt & therapists' safety. Prior to admission pt was mod I with rollator but hx of a few falls, living alone in a split level home with stairs to enter, & family providing assistance PRN. On this date, pt required max assist +2 for bed mobility, STS & step pivot with RW. Pt demonstrates impaired balance, activity tolerance & strength.   Pt is unsafe to d/c home alone & would benefit from ongoing PT services to address deficits noted below.   Recommendations for follow up therapy are one component of a multi-disciplinary discharge planning process, led by the attending physician.  Recommendations may be updated based on patient status, additional functional criteria and insurance authorization.  Follow Up Recommendations Can patient physically be transported by private vehicle: No     Assistance Recommended at Discharge Frequent or constant Supervision/Assistance  Patient can return home with the following  Two people to help with bathing/dressing/bathroom;Two people to help with walking and/or transfers;Help with stairs or ramp for entrance;Direct supervision/assist for medications management;Assistance with feeding;Assist for transportation;Assistance with cooking/housework;Direct supervision/assist for financial management    Equipment Recommendations None recommended by PT (TBD in next venue)  Recommendations for Other Services       Functional Status Assessment Patient has had a  recent decline in their functional status and demonstrates the ability to make significant improvements in function in a reasonable and predictable amount of time.     Precautions / Restrictions Precautions Precautions: Fall Restrictions Weight Bearing Restrictions: Yes RLE Weight Bearing: Weight bearing as tolerated LLE Weight Bearing: Weight bearing as tolerated      Mobility  Bed Mobility Overal bed mobility: Needs Assistance Bed Mobility: Supine to Sit     Supine to sit: Max assist, +2 for physical assistance, HOB elevated     General bed mobility comments: assistance to move BLE & hips towards EOB, upright trunk, despite cuing to use bed rails, HOB elevated    Transfers Overall transfer level: Needs assistance Equipment used: Rolling walker (2 wheels) Transfers: Sit to/from Stand, Bed to chair/wheelchair/BSC Sit to Stand: Max assist, +2 physical assistance, From elevated surface   Step pivot transfers: Max assist, +2 physical assistance, From elevated surface       General transfer comment: STS from EOB x 2 attempts with RW with education re: proper hand placement during STS, therapists has to block BLE from sliding forward (especially RLE). Poor ability to upright trunk & shift pelvis anteriorly once in standing despite cuing. Pt requires MAX assist +2 for step pivot bed>recliner on R as pt able to take ~2-3 steps but then with worsening posterior lean & decreased ability to step so therapist provided assistance to safely turn to recliner.    Ambulation/Gait                  Stairs            Wheelchair Mobility    Modified Rankin (Stroke Patients Only)       Balance  Overall balance assessment: Needs assistance Sitting-balance support: Bilateral upper extremity supported, Feet supported Sitting balance-Leahy Scale: Poor Sitting balance - Comments: Posterior lean, constant assistance for anterior shift to midline/upright sitting.   Standing  balance support: Bilateral upper extremity supported, During functional activity, Reliant on assistive device for balance Standing balance-Leahy Scale: Zero Standing balance comment: Poor ability to shift pelvis anteriorly & upright trunk despite cuing.                             Pertinent Vitals/Pain Pain Assessment Pain Assessment: 0-10 Pain Score: 10-Worst pain ever Pain Location: L hip Pain Descriptors / Indicators: Discomfort, Grimacing, Guarding Pain Intervention(s): Monitored during session, Limited activity within patient's tolerance, Repositioned    Home Living Family/patient expects to be discharged to:: Private residence Living Arrangements: Alone Available Help at Discharge: Family;Available PRN/intermittently Type of Home: House Home Access: Stairs to enter Entrance Stairs-Rails: Left Entrance Stairs-Number of Steps: 1 (3") + 4 Alternate Level Stairs-Number of Steps: 1 step up, 1 step down Home Layout: Two level Home Equipment: Rollator (4 wheels);Cane - single point;BSC/3in1 Additional Comments: Pt lives alone in split level house, 3 stairs up/down stairs with one side railing. Pt uses rollator and cane in home.    Prior Function Prior Level of Function : Needs assist;History of Falls (last six months)             Mobility Comments: Pt uses rollator at all times except with family takes her out in the community she will briefly use SPC to get from car to rollator. Pt with hx of a few falls in the past 6 months. ADLs Comments: Family provides pt with meals or pt fixes simple things, has an aide that assists with bathing 1x/week, pt dresses herself, pays for house cleaner 1x/month, pays all her bills.     Hand Dominance        Extremity/Trunk Assessment   Upper Extremity Assessment Upper Extremity Assessment: Generalized weakness    Lower Extremity Assessment Lower Extremity Assessment: Generalized weakness;LLE deficits/detail LLE Deficits /  Details: 3-/5 knee extension in sitting    Cervical / Trunk Assessment Cervical / Trunk Assessment: Kyphotic  Communication   Communication: HOH (R ear best, uses hearing aides)  Cognition Arousal/Alertness: Awake/alert Behavior During Therapy: WFL for tasks assessed/performed Overall Cognitive Status: Within Functional Limits for tasks assessed                                 General Comments: Pt noted to have some slurred speech, family reports pt is not enunciating as well as prior to sx, no other focal deficits noted - MD & nurse made aware.        General Comments General comments (skin integrity, edema, etc.): Pt c/o nausea during session but no emesis.    Exercises General Exercises - Lower Extremity Long Arc Quad: AROM, Strengthening, Left, 5 reps, Seated   Assessment/Plan    PT Assessment Patient needs continued PT services  PT Problem List Pain;Decreased strength;Decreased activity tolerance;Decreased balance;Decreased safety awareness;Decreased mobility;Decreased knowledge of precautions;Decreased knowledge of use of DME;Decreased range of motion       PT Treatment Interventions DME instruction;Therapeutic exercise;Balance training;Gait training;Functional mobility training;Therapeutic activities;Patient/family education;Neuromuscular re-education;Manual techniques;Modalities    PT Goals (Current goals can be found in the Care Plan section)  Acute Rehab PT Goals Patient Stated Goal: return to PLOF PT Goal  Formulation: With patient/family Time For Goal Achievement: 10/16/22 Potential to Achieve Goals: Fair    Frequency Min 3X/week     Co-evaluation PT/OT/SLP Co-Evaluation/Treatment: Yes Reason for Co-Treatment: For patient/therapist safety PT goals addressed during session: Mobility/safety with mobility;Balance;Proper use of DME OT goals addressed during session: ADL's and self-care;Proper use of Adaptive equipment and DME       AM-PAC PT "6  Clicks" Mobility  Outcome Measure Help needed turning from your back to your side while in a flat bed without using bedrails?: Total Help needed moving from lying on your back to sitting on the side of a flat bed without using bedrails?: Total Help needed moving to and from a bed to a chair (including a wheelchair)?: Total Help needed standing up from a chair using your arms (e.g., wheelchair or bedside chair)?: Total Help needed to walk in hospital room?: Total Help needed climbing 3-5 steps with a railing? : Total 6 Click Score: 6    End of Session Equipment Utilized During Treatment: Gait belt Activity Tolerance: Patient limited by pain;Patient limited by fatigue Patient left: in chair;with call bell/phone within reach;with family/visitor present Nurse Communication: Mobility status (no chair alarm as none were in supply closet) PT Visit Diagnosis: Pain;Muscle weakness (generalized) (M62.81);Other abnormalities of gait and mobility (R26.89);Difficulty in walking, not elsewhere classified (R26.2);Unsteadiness on feet (R26.81) Pain - Right/Left: Left Pain - part of body: Hip    Time: 8295-6213 PT Time Calculation (min) (ACUTE ONLY): 37 min   Charges:   PT Evaluation $PT Eval Moderate Complexity: 1 Mod PT Treatments $Therapeutic Activity: 8-22 mins        Aleda Grana, PT, DPT 10/02/22, 12:53 PM   Sandi Mariscal 10/02/2022, 12:52 PM

## 2022-10-03 ENCOUNTER — Inpatient Hospital Stay (HOSPITAL_COMMUNITY): Payer: Medicare Other

## 2022-10-03 ENCOUNTER — Other Ambulatory Visit (HOSPITAL_COMMUNITY): Payer: Medicare Other

## 2022-10-03 DIAGNOSIS — I6389 Other cerebral infarction: Secondary | ICD-10-CM | POA: Diagnosis not present

## 2022-10-03 DIAGNOSIS — S72142A Displaced intertrochanteric fracture of left femur, initial encounter for closed fracture: Secondary | ICD-10-CM | POA: Diagnosis not present

## 2022-10-03 DIAGNOSIS — I639 Cerebral infarction, unspecified: Secondary | ICD-10-CM | POA: Diagnosis not present

## 2022-10-03 DIAGNOSIS — I63413 Cerebral infarction due to embolism of bilateral middle cerebral arteries: Secondary | ICD-10-CM | POA: Diagnosis not present

## 2022-10-03 LAB — CBC
HCT: 24 % — ABNORMAL LOW (ref 36.0–46.0)
Hemoglobin: 7.5 g/dL — ABNORMAL LOW (ref 12.0–15.0)
MCH: 31.8 pg (ref 26.0–34.0)
MCHC: 31.3 g/dL (ref 30.0–36.0)
MCV: 101.7 fL — ABNORMAL HIGH (ref 80.0–100.0)
Platelets: 120 10*3/uL — ABNORMAL LOW (ref 150–400)
RBC: 2.36 MIL/uL — ABNORMAL LOW (ref 3.87–5.11)
RDW: 15.3 % (ref 11.5–15.5)
WBC: 11.3 10*3/uL — ABNORMAL HIGH (ref 4.0–10.5)
nRBC: 0 % (ref 0.0–0.2)

## 2022-10-03 LAB — ECHOCARDIOGRAM COMPLETE
Height: 63 in
S' Lateral: 2.7 cm
Weight: 2430.35 oz

## 2022-10-03 LAB — BASIC METABOLIC PANEL
Anion gap: 7 (ref 5–15)
BUN: 45 mg/dL — ABNORMAL HIGH (ref 8–23)
CO2: 22 mmol/L (ref 22–32)
Calcium: 7.7 mg/dL — ABNORMAL LOW (ref 8.9–10.3)
Chloride: 102 mmol/L (ref 98–111)
Creatinine, Ser: 1.39 mg/dL — ABNORMAL HIGH (ref 0.44–1.00)
GFR, Estimated: 33 mL/min — ABNORMAL LOW (ref >60)
Glucose, Bld: 183 mg/dL — ABNORMAL HIGH (ref 70–99)
Potassium: 4.9 mmol/L (ref 3.5–5.1)
Sodium: 131 mmol/L — ABNORMAL LOW (ref 135–145)

## 2022-10-03 LAB — GLUCOSE, CAPILLARY
Glucose-Capillary: 161 mg/dL — ABNORMAL HIGH (ref 70–99)
Glucose-Capillary: 206 mg/dL — ABNORMAL HIGH (ref 70–99)
Glucose-Capillary: 168 mg/dL — ABNORMAL HIGH (ref 70–99)
Glucose-Capillary: 168 mg/dL — ABNORMAL HIGH (ref 70–99)

## 2022-10-03 MED ORDER — SODIUM CHLORIDE 0.9 % IV BOLUS
250.0000 mL | Freq: Once | INTRAVENOUS | Status: AC
  Administered 2022-10-03: 250 mL via INTRAVENOUS

## 2022-10-03 MED ORDER — STROKE: EARLY STAGES OF RECOVERY BOOK
Freq: Once | Status: AC
  Filled 2022-10-03: qty 1

## 2022-10-03 NOTE — Progress Notes (Signed)
PROGRESS NOTE    Amanda [name]  ZOX:[id number] DOB: 07/26/1918 DOA: 09/29/2022 PCP: Lupita Raider, MD     Brief Narrative:  Amanda [name] is a 89 y.o. female with a history of chronic atrial fibrillation, diabetes mellitus, hypothyroidism. Patient presented after a fall at home and subsequent pain preventing ambulation. Patient was found to have a left hip fracture. Orthopedic surgery consulted for surgical management.  Patient underwent intramedullary nail fixation of left hip by Dr. Roda Shutters 5/15.  New events last 24 hours / Subjective: Patient seen and daughter at bedside.  Remains dysarthric, which frustrates patient.  She is alert and oriented.  Assessment & Plan:   Principal Problem:   Displaced intertrochanteric fracture of left femur, initial encounter for closed fracture [facility]) Active Problems:   Hypothyroidism   History of CVA (cerebrovascular accident)   Permanent atrial fibrillation (HCC)   Diabetes mellitus type 2, controlled (HCC)   HTN (hypertension)   Hip fracture (HCC)   Restless leg syndrome   DNR (do not resuscitate)  Left hip fracture secondary to mechanical fall -Orthopedic surgery consulted -S/p intramedullary nail fixation of left hip by Dr. Roda Shutters 5/15 -PT OT   Small bilateral acute CVA -MRI brain: Acute lacunar infarct in the bilateral corona radiata  -Neurology consulted -Echocardiogram ordered  -PT OT SLP  AKI on CKD stage IIIa -Baseline creatinine 1 -Hold lisinopril -IV fluid -Check renal ultrasound  Hyperkalemia -Resolved  Chronic A-fib -Xarelto   Diabetes mellitus type 2 -SSI  Hypertension -Lisinopril on hold due to AKI  Hypothyroidism -Synthroid  RLS -Requip  CKD stage IIIa -Stable   DVT prophylaxis:  SCDs Start: 10/01/22 1829 Place TED hose Start: 10/01/22 1829 SCDs Start: 09/29/22 1516 Rivaroxaban (XARELTO) tablet 15 mg  Code Status: DNR Family Communication: Daughter at bedside Disposition Plan: Consideration  for CIR Status is: Inpatient Remains inpatient appropriate because: Stroke workup    Antimicrobials:  Anti-infectives (From admission, onward)    Start     Dose/Rate Route Frequency Ordered Stop   10/01/22 2000  ceFAZolin (ANCEF) IVPB 2g/100 mL premix        2 g 200 mL/hr over 30 Minutes Intravenous Every 6 hours 10/01/22 1828 10/03/22 0951        Objective: Vitals:   10/02/22 2128 10/03/22 0414 10/03/22 0500 10/03/22 0804  BP: (!) 121/47 (!) 90/39 (!) 96/42 (!) 120/37  Pulse: 85 77 78 90  Resp:  16 16 16   Temp: 98 F (36.7 C)   98.9 F (37.2 C)  TempSrc: Axillary   Oral  SpO2: 94% 92% 92% 100%  Weight:      Height:        Intake/Output Summary (Last 24 hours) at 10/03/2022 1051 Last data filed at 10/03/2022 0500 Gross per 24 hour  Intake --  Output 350 ml  Net -350 ml   Filed Weights   09/29/22 1127  Weight: 68.9 kg    Examination:  General exam: Appears calm and comfortable  Respiratory system: Clear to auscultation. Respiratory effort normal. No respiratory distress. No conversational dyspnea.  Cardiovascular system: S1 & S2 heard, irreg rhythm  Gastrointestinal system: Abdomen is nondistended, soft and nontender. Normal bowel sounds heard. Central nervous system: Alert, oriented, dysarthria, left facial droop  Extremities: Symmetric in appearance  Skin: No rashes, lesions or ulcers on exposed skin    Data Reviewed: I have personally reviewed following labs and imaging studies  CBC: Recent Labs  Lab 09/29/22 1135 10/01/22 8119 10/02/22 0128 10/03/22 1478  WBC 9.2 14.8* 13.5* 11.3*  NEUTROABS 7.0  --   --   --   HGB 11.2* 8.2* 8.9* 7.5*  HCT 35.7* 25.2* 27.1* 24.0*  MCV 103.2* 98.1 96.1 101.7*  PLT 190 129* 127* 120*   Basic Metabolic Panel: Recent Labs  Lab 09/29/22 1135 10/01/22 0625 10/02/22 0128 10/02/22 0812 10/02/22 1317 10/03/22 0851  NA 132* 129* 129* 130*  --  131*  K 4.7 4.8 6.0* 5.5* 4.7 4.9  CL 100 98 97* 97*  --  102  CO2  22 23 21* 23  --  22  GLUCOSE 246* 144* 255* 231*  --  183*  BUN 20 30* 38* 40*  --  45*  CREATININE 0.92 1.05* 1.30* 1.40*  --  1.39*  CALCIUM 8.7* 8.3* 8.0* 8.1*  --  7.7*   GFR: Estimated Creatinine Clearance: 19 mL/min (A) (by C-G formula based on SCr of 1.39 mg/dL (H)). Liver Function Tests: No results for input(s): "AST", "ALT", "ALKPHOS", "BILITOT", "PROT", "ALBUMIN" in the last 168 hours. No results for input(s): "LIPASE", "AMYLASE" in the last 168 hours. No results for input(s): "AMMONIA" in the last 168 hours. Coagulation Profile: Recent Labs  Lab 09/29/22 1135  INR 1.4*   Cardiac Enzymes: No results for input(s): "CKTOTAL", "CKMB", "CKMBINDEX", "TROPONINI" in the last 168 hours. BNP (last 3 results) No results for input(s): "PROBNP" in the last 8760 hours. HbA1C: No results for input(s): "HGBA1C" in the last 72 hours. CBG: Recent Labs  Lab 10/02/22 1118 10/02/22 1247 10/02/22 1649 10/02/22 2024 10/03/22 0805  GLUCAP 218* 138* 165* 130* 161*   Lipid Profile: No results for input(s): "CHOL", "HDL", "LDLCALC", "TRIG", "CHOLHDL", "LDLDIRECT" in the last 72 hours. Thyroid Function Tests: No results for input(s): "TSH", "T4TOTAL", "FREET4", "T3FREE", "THYROIDAB" in the last 72 hours. Anemia Panel: No results for input(s): "VITAMINB12", "FOLATE", "FERRITIN", "TIBC", "IRON", "RETICCTPCT" in the last 72 hours. Sepsis Labs: No results for input(s): "PROCALCITON", "LATICACIDVEN" in the last 168 hours.  Recent Results (from the past 240 hour(s))  Surgical pcr screen     Status: None   Collection Time: 09/30/22  5:40 PM   Specimen: Nasal Mucosa; Nasal Swab  Result Value Ref Range Status   MRSA, PCR NEGATIVE NEGATIVE Final   Staphylococcus aureus NEGATIVE NEGATIVE Final    Comment: (NOTE) The Xpert SA Assay (FDA approved for NASAL specimens in patients 71 years of age and older), is one component of a comprehensive surveillance program. It is not intended to diagnose  infection nor to guide or monitor treatment. Performed at [facility], [address], [place], [place] [address]       Radiology Studies: MR BRAIN WO CONTRAST  Result Date: 10/02/2022 CLINICAL DATA:  Neuro deficit with acute stroke suspected EXAM: MRI HEAD WITHOUT CONTRAST TECHNIQUE: Multiplanar, multiecho pulse sequences of the brain and surrounding structures were obtained without intravenous contrast. COMPARISON:  Head CT from 3 days ago FINDINGS: Brain: Small acute infarcts in the bilateral corona radiata. Although that bilateral and somewhat symmetric in size and location these are not at the basal ganglia to imply a global/toxic cause. There is chronic small vessel ischemia in the cerebral white matter to a mild degree for age. No acute hemorrhage, hydrocephalus, or collection. Cerebral volume loss that is mild for age. Few chronic microhemorrhages mainly in the deep brain , likely microvascular ischemic. Vascular: Normal flow voids. Skull and upper cervical spine: Normal marrow signal. Sinuses/Orbits: Negative. IMPRESSION: 1. Acute lacunar infarct in the  bilateral corona radiata. 2. Generalized atrophy and chronic small vessel ischemia. Electronically Signed   By: Tiburcio Pea M.D.   On: 10/02/2022 19:27   DG FEMUR MIN 2 VIEWS LEFT  Result Date: 10/01/2022 CLINICAL DATA:  [id number] Surgery, elective [id number] EXAM: LEFT FEMUR 2 VIEWS COMPARISON:  09/29/2022 FINDINGS: Intraoperative images during left femur intramedullary nailing for an intertrochanteric fracture. Improved fracture alignment. Intact hardware without evidence of loosening. IMPRESSION: Intraoperative images during left femur ORIF. No evidence of immediate hardware complication. Improved intertrochanteric fracture alignment. Electronically Signed   By: Caprice Renshaw M.D.   On: 10/01/2022 16:14   DG C-Arm 1-60 Min-No Report  Result Date: 10/01/2022 Fluoroscopy was utilized by the requesting physician.  No radiographic  interpretation.      Scheduled Meds:  Chlorhexidine Gluconate Cloth  6 each Topical Daily   docusate sodium  100 mg Oral BID   insulin aspart  0-9 Units Subcutaneous TID WC   levothyroxine  88 mcg Oral Q0600   loratadine  10 mg Oral Daily   rivaroxaban  15 mg Oral Q supper   rOPINIRole  0.25 mg Oral QHS   Continuous Infusions:  sodium chloride 125 mL/hr at 10/03/22 1020   methocarbamol (ROBAXIN) IV     promethazine (PHENERGAN) injection (IM or IVPB) 12.5 mg (10/02/22 2130)     LOS: 4 days   Time spent: 25 minutes   Noralee Stain, DO [facility] 10/03/2022, 10:51 AM   Available via Epic secure chat 7am-7pm After these hours, please refer to coverage provider listed on [email or web address]

## 2022-10-03 NOTE — Progress Notes (Signed)
Carotid artery duplex has been completed. Preliminary results can be found in CV Proc through chart review.   10/03/22 5:42 PM Olen Cordial RVT

## 2022-10-03 NOTE — Progress Notes (Signed)
Amanda [name] is sitting in bed this morning. Complaining of some difficulty swallowing.  Daughter at bedside. Reports some pain when the left leg is moved.  She was able to sit in recliner for PT yesterday. MRI was consistent with TIA. Right facial droop present. Surgical bandages are clean and dry without drainage. Stable from ortho stand point. Continue PT and OT as tolerated. May resume Xarelto from ortho stand point.   Weightbearing: WBAT LLE Insicional and dressing care: Reinforce dressings as needed Orthopedic device(s): None Showering: after 5 days postop VTE prophylaxis: resume xarelto Pain control: per primary team Follow - up plan: 2 weeks Contact information:  Roda Shutters, M.D., [place]

## 2022-10-03 NOTE — Progress Notes (Signed)
Speech Language Pathology Treatment: Dysphagia  Patient Details Name: Amanda [name] MRN: [id number] DOB: November 06, 1918 Today's Date: 10/03/2022 Time: 8295-6213 SLP Time Calculation (min) (ACUTE ONLY): 28 min  Assessment / Plan / Recommendation Clinical Impression  Visited pt and family at bedside after swallow eval to provide result and instruction to family. Explained finding of mild oropharyngeal dysphagia warranting thickened liquids until pt a bit more mobile and stronger. Also explained pre-existing esophageal dysphagia with increased sensation of globus and coughing at this time. Provided compensatory strategies for reflux precautions and oral hydration for dysarthria. Pt struggles to communicate more when fatigued. Family appreciative. Helped reposition pt for breakfast and daughter gave some sips of nectar thick juice  HPI HPI: Pt is a 89 year old female admitted after a fall and subsequent pain preventing ambulation. Patient was found to have a left hip fracture. Underwnt surgery on 5/15. Reported feeling of food and liquid in throat. Family reports this has been ongoing for a while. She is afraid of choking. Found to ahve had acute CVA perioperatively. MRI shows Acute lacunar infarct in the bilateral corona radiata.      SLP Plan  Continue with current plan of care      Recommendations for follow up therapy are one component of a multi-disciplinary discharge planning process, led by the attending physician.  Recommendations may be updated based on patient status, additional functional criteria and insurance authorization.    Recommendations  Diet recommendations: Dysphagia 1 (puree);Nectar-thick liquid Liquids provided via: Cup;Straw Medication Administration: Crushed with puree Supervision: Full supervision/cueing for compensatory strategies Compensations: Follow solids with liquid;Slow rate;Small sips/bites;Minimize environmental distractions Postural Changes and/or Swallow  Maneuvers: Seated upright 90 degrees                  Oral care BID   Frequent or constant Supervision/Assistance Dysphagia, oropharyngeal phase (R13.12)     Continue with current plan of care     Amanda [name], Riley Nearing  10/03/2022, 12:37 PM

## 2022-10-03 NOTE — PMR Pre-admission (Signed)
PMR Admission Coordinator Pre-Admission Assessment  Patient: Amanda [name] is an 89 y.o., female MRN: [id number] DOB: 1919-02-24 Height: 5\' 3"  (160 cm) Weight: 68.9 kg  Insurance Information HMO:     PPO:      PCP:      IPA:      80/20:      OTHER:  PRIMARY: Medicare a and b      Policy#: [id number]      Subscriber: pt Benefits:  Phone #: passport one source     Name: 5/17 Eff. Date: a 09/16/1985 and b 11/17/1986     Deduct: $1632      Out of Pocket Max: none      Life Max: none CIR: 100%      SNF: 20 full days Outpatient: 80%     Co-Pay: 20% Home Health: 100%      Co-Pay: none DME: 80%     Co-Pay: 20% Providers: pt choice  SECONDARY: AARP supplement      Policy#: [id number]  Financial Counselor:       Phone#:   The "Data Collection Information Summary" for patients in Inpatient Rehabilitation Facilities with attached "Privacy Act Statement-Health Care Records" was provided and verbally reviewed with: Family  Emergency Contact Information Contact Information     Name Relation Home Work Mobile   Lattimore Daughter [phone]  [phone]   Sutphin,Nancy Daughter [phone]     Tiajuana Amass [phone]     Zimmerman,Linda Relative [phone]     Mayim, Shaff   [phone]      Current Medical History  Patient Admitting Diagnosis: Hip fx and CVA  History of Present Illness: 89 year old female with history of afib on Xarelto, CVA, HTN, restless leg syndrome,  CKD stage IIIa,DM and hypothyroidism. Presented on 09/29/22 after a fall on stairs.  Imaging revealed left hip fracture. Ortho consulted. Underwent IM nail fixation by Dr Roda Shutters on 5/15. Postoperatively on 5/16 developed new slurred speech and facial droop. Neurology consulted. MRI revealed two small white matter acute ischemic infarctions bilaterally. AKI on CKD IIIA. Baseline creat 1. Hold lisinopril. And given IV fluids. Renal Ultrasound pending. Remain on Xarelto. SSI for type 2 DM.Marland Kitchen  Complete  NIHSS TOTAL: 7  Patient's medical record from [facility] has been reviewed by the rehabilitation admission coordinator and physician.  Past Medical History  Past Medical History:  Diagnosis Date   Carpal tunnel syndrome    Chronic anticoagulation    Chronic atrial fibrillation (HCC)    Diabetes mellitus    NON-INSULIN   Hypothyroidism    Sinus congestion    Has the patient had major surgery during 100 days prior to admission? Yes  Family History   family history includes Diabetes in her sister; Hypertension in her mother and another family member; Stroke in her mother.  Current Medications  Current Facility-Administered Medications:    [START ON 10/04/2022]  stroke: early stages of recovery book, , Does not apply, Once, Gevena Mart A, NP   0.9 %  sodium chloride infusion, , Intravenous, Continuous, Noralee Stain, DO, Last Rate: 125 mL/hr at 10/03/22 1355, New Bag at 10/03/22 1355   acetaminophen (TYLENOL) tablet 325-650 mg, 325-650 mg, Oral, Q6H PRN, Tarry Kos, MD, 650 mg at 10/03/22 1530   alum & mag hydroxide-simeth (MAALOX/MYLANTA) 200-200-20 MG/5ML suspension 30 mL, 30 mL, Oral, Q4H PRN, Tarry Kos, MD, 30 mL at 10/02/22 0827   bisacodyl (DULCOLAX) EC tablet 5 mg, 5 mg, Oral, Daily PRN, Roda Shutters,  Edwin Cap, MD   Chlorhexidine Gluconate Cloth 2 % PADS 6 each, 6 each, Topical, Daily, Tarry Kos, MD, 6 each at 10/03/22 0952   docusate sodium (COLACE) capsule 100 mg, 100 mg, Oral, BID, Tarry Kos, MD, 100 mg at 10/03/22 0956   HYDROmorphone (DILAUDID) injection 0.5-1 mg, 0.5-1 mg, Intravenous, Q4H PRN, Tarry Kos, MD, 1 mg at 10/02/22 2227   insulin aspart (novoLOG) injection 0-9 Units, 0-9 Units, Subcutaneous, TID WC, Tarry Kos, MD, 3 Units at 10/03/22 1218   levothyroxine (SYNTHROID) tablet 88 mcg, 88 mcg, Oral, Q0600, Tarry Kos, MD, 88 mcg at 10/03/22 0956   loratadine (CLARITIN) tablet 10 mg, 10 mg, Oral, Daily, Tarry Kos, MD, 10 mg at 10/03/22  0956   magnesium citrate solution 1 Bottle, 1 Bottle, Oral, Once PRN, Tarry Kos, MD   menthol-cetylpyridinium (CEPACOL) lozenge 3 mg, 1 lozenge, Oral, PRN **OR** phenol (CHLORASEPTIC) mouth spray 1 spray, 1 spray, Mouth/Throat, PRN, Tarry Kos, MD   methocarbamol (ROBAXIN) tablet 500 mg, 500 mg, Oral, Q6H PRN, 500 mg at 10/03/22 1026 **OR** methocarbamol (ROBAXIN) 500 mg in dextrose 5 % 50 mL IVPB, 500 mg, Intravenous, Q6H PRN, Roda Shutters, Edwin Cap, MD   ondansetron (ZOFRAN) tablet 4 mg, 4 mg, Oral, Q6H PRN **OR** ondansetron (ZOFRAN) injection 4 mg, 4 mg, Intravenous, Q6H PRN, Tarry Kos, MD, 4 mg at 10/02/22 1648   oxyCODONE (Oxy IR/ROXICODONE) immediate release tablet 10-15 mg, 10-15 mg, Oral, Q4H PRN, Tarry Kos, MD, 15 mg at 10/02/22 2142   oxyCODONE (Oxy IR/ROXICODONE) immediate release tablet 5-10 mg, 5-10 mg, Oral, Q4H PRN, Tarry Kos, MD, 5 mg at 10/03/22 1530   polyethylene glycol (MIRALAX / GLYCOLAX) packet 17 g, 17 g, Oral, Daily PRN, Tarry Kos, MD   promethazine (PHENERGAN) 12.5 mg in sodium chloride 0.9 % 50 mL IVPB, 12.5 mg, Intravenous, Q6H PRN, Noralee Stain, DO, Last Rate: 200 mL/hr at 10/02/22 2130, 12.5 mg at 10/02/22 2130   Rivaroxaban (XARELTO) tablet 15 mg, 15 mg, Oral, Q supper, Rexford Maus, RPH, 15 mg at 10/02/22 2200   rOPINIRole (REQUIP) tablet 0.25 mg, 0.25 mg, Oral, QHS, Xu, Naiping M, MD, 0.25 mg at 10/02/22 2143   sodium chloride (OCEAN) 0.65 % nasal spray 1 spray, 1 spray, Nasal, Daily PRN, Tarry Kos, MD   sorbitol 70 % solution 30 mL, 30 mL, Oral, Daily PRN, Tarry Kos, MD  Patients Current Diet:  Diet Order             DIET - DYS 1 Room service appropriate? Yes; Fluid consistency: Nectar Thick  Diet effective now                  Precautions / Restrictions Precautions Precautions: Fall Restrictions Weight Bearing Restrictions: Yes RLE Weight Bearing: Weight bearing as tolerated LLE Weight Bearing: Weight bearing as  tolerated   Has the patient had 2 or more falls or a fall with injury in the past year? Yes  Prior Activity Level Limited Community (1-2x/wk): Mod I with RW, I with dressing; aide once week to asist her in showering; family provides meals  Prior Functional Level Self Care: Did the patient need help bathing, dressing, using the toilet or eating? Needed some help showering but otherwise dressed self daily  Indoor Mobility: Did the patient need assistance with walking from room to room (with or without device)? Independent  Stairs: Did the patient need assistance with internal or  external stairs (with or without device)? Independent  Functional Cognition: Did the patient need help planning regular tasks such as shopping or remembering to take medications? Independent  Patient Information Are you of Hispanic, Latino/a,or Spanish origin?: A. No, not of Hispanic, Latino/a, or Spanish origin, X. Patient unable to respond (per son) What is your race?: X. Patient unable to respond (son states white) Do you need or want an interpreter to communicate with a doctor or health care staff?: 9. Unable to respond (son states no)  Patient's Response To:  Health Literacy and Transportation Is the patient able to respond to health literacy and transportation needs?: No Health Literacy - How often do you need to have someone help you when you read instructions, pamphlets, or other written material from your doctor or pharmacy?: Patient unable to respond In the past 12 months, has lack of transportation kept you from medical appointments or from getting medications?: No (per son) In the past 12 months, has lack of transportation kept you from meetings, work, or from getting things needed for daily living?: No (per son)  Journalist, newspaper / Equipment Home Assistive Devices/Equipment: Medical laboratory scientific officer (specify quad or straight), Environmental consultant (specify type) Home Equipment: Rollator (4 wheels), Cane - single point,  BSC/3in1  Prior Device Use: Indicate devices/aids used by the patient prior to current illness, exacerbation or injury? Walker  Current Functional Level Cognition  Overall Cognitive Status: Within Functional Limits for tasks assessed Orientation Level: Oriented X4 General Comments: Pt noted to have some slurred speech, family reports pt is not enunciating as well as prior to sx, no other focal deficits noted - MD & nurse made aware.    Extremity Assessment (includes Sensation/Coordination)  Upper Extremity Assessment: Generalized weakness  Lower Extremity Assessment: Generalized weakness, LLE deficits/detail LLE Deficits / Details: 3-/5 knee extension in sitting    ADLs  Overall ADL's : Needs assistance/impaired Eating/Feeding: NPO Eating/Feeding Details (indicate cue type and reason): until speech eval due to feeling like food gets stuck when eating Grooming: Set up, Sitting Upper Body Bathing: Sitting, Moderate assistance Upper Body Bathing Details (indicate cue type and reason): poor sitting balance Lower Body Bathing: Total assistance, +2 for physical assistance, Sitting/lateral leans Upper Body Dressing : Moderate assistance, Sitting Lower Body Dressing: Total assistance, +2 for physical assistance, Sit to/from stand Toilet Transfer: Maximal assistance, +2 for physical assistance, Rolling walker (2 wheels) Toileting- Clothing Manipulation and Hygiene: Maximal assistance, +2 for physical assistance, Sit to/from stand Functional mobility during ADLs: Maximal assistance, +2 for physical assistance, Rolling walker (2 wheels) General ADL Comments: Pt requires significant assistance with sitting on on EOB, LB ADLs, and transfers. Poor balance with standing, x2 for physical assistance and safety    Mobility  Overal bed mobility: Needs Assistance Bed Mobility: Supine to Sit Supine to sit: Max assist, +2 for physical assistance, HOB elevated General bed mobility comments: Pt able to  assist with RLE advancement to EOB, however required max A +2 for LLE management, trunk elevation, and scooting forward at EOB    Transfers  Overall transfer level: Needs assistance Equipment used: Rolling walker (2 wheels) Transfers: Sit to/from Stand, Bed to chair/wheelchair/BSC Sit to Stand: Max assist, +2 physical assistance, From elevated surface Bed to/from chair/wheelchair/BSC transfer type:: Stand pivot Stand pivot transfers: Max assist, +2 physical assistance Step pivot transfers: Max assist, +2 physical assistance, From elevated surface General transfer comment: From EOB x2 with max A +2 for power up and to maintain upright posture due to pt flexing  forward. Attempted step pivot, however pt unable to step with RLE. Pt requring max A +2 to stand pivot to recliner due to pain and fatigue.    Ambulation / Gait / Stairs / Wheelchair Mobility  Ambulation/Gait General Gait Details: unable    Posture / Balance Dynamic Sitting Balance Sitting balance - Comments: Sitting EOB with posterior lean requiring assist to maintain balance Balance Overall balance assessment: Needs assistance Sitting-balance support: Bilateral upper extremity supported, Feet supported Sitting balance-Leahy Scale: Poor Sitting balance - Comments: Sitting EOB with posterior lean requiring assist to maintain balance Postural control: Posterior lean, Right lateral lean Standing balance support: Bilateral upper extremity supported, During functional activity, Reliant on assistive device for balance Standing balance-Leahy Scale: Zero Standing balance comment: With RW support. Pt requiring assist to maintain standing    Special needs/care consideration HOH with bilateral hearing aides Wears glasses DNR on acute hospital   Previous Home Environment  Living Arrangements: Alone  Lives With: Alone Available Help at Discharge: Family, Available 24 hours/day (family will provide with hired caregivers and family 24/7  assist) Type of Home: House Home Layout:  (spit level) Alternate Level Stairs-Rails: Right Alternate Level Stairs-Number of Steps: 1 step up, 1 step down Home Access: Stairs to enter Entrance Stairs-Rails: Left Entrance Stairs-Number of Steps: 1 (3") + 4 Bathroom Shower/Tub: Tub/shower unit, Health visitor: Standard Bathroom Accessibility: Yes How Accessible: Accessible via walker Home Care Services: No Type of Home Care Services: Homehealth aide Additional Comments: Pt lives alone in split level house, 3 stairs up/down stairs with one side railing. Pt uses rollator and cane in home.  Discharge Living Setting Plans for Discharge Living Setting: Patient's home, House, Alone Type of Home at Discharge: House Discharge Home Layout:  (split level) Discharge Home Access: Stairs to enter Entrance Stairs-Rails: Left Entrance Stairs-Number of Steps: 1 + 4 Discharge Bathroom Shower/Tub: Tub/shower unit Discharge Bathroom Toilet: Standard Discharge Bathroom Accessibility: Yes How Accessible: Accessible via walker Does the patient have any problems obtaining your medications?: No  Social/Family/Support Systems Patient Roles: Parent Contact Information: daughter, Sedalia Muta is POA and son, Kathlene November main contacts Anticipated Caregiver: family and hired caregivers Anticipated Industrial/product designer Information: Diane and Kathlene November, dtr/POA and son are main contacts Ability/Limitations of Caregiver: Diane can provide supervision prn only as well as Kathlene November; they will hire assist Caregiver Availability: 24/7 Discharge Plan Discussed with Primary Caregiver: Yes Is Caregiver In Agreement with Plan?: Yes Does Caregiver/Family have Issues with Lodging/Transportation while Pt is in Rehab?: No  Goals Patient/Family Goal for Rehab: supervision with PT, supervision to min OT, supervision SLP Expected length of stay: ELOS 10 to 14 days Pt/Family Agrees to Admission and willing to participate:  Yes Program Orientation Provided & Reviewed with Pt/Caregiver Including Roles  & Responsibilities: Yes  Decrease burden of Care through IP rehab admission: n/a  Possible need for SNF placement upon discharge: not anticipated  Patient Condition: I have reviewed medical records from [facility], spoken with CM, and patient, son, and daughter. I met with patient at the bedside for inpatient rehabilitation assessment.  Patient will benefit from ongoing PT, OT, and SLP, can actively participate in 3 hours of therapy a day 5 days of the week, and can make measurable gains during the admission.  Patient will also benefit from the coordinated team approach during an Inpatient Acute Rehabilitation admission.  The patient will receive intensive therapy as well as Rehabilitation physician, nursing, social worker, and care management interventions.  Due to bladder management,  bowel management, safety, skin/wound care, disease management, medication administration, pain management, and patient education the patient requires 24 hour a day rehabilitation nursing.  The patient is currently *** with mobility and basic ADLs.  Discharge setting and therapy post discharge at home with home health is anticipated.  Patient has agreed to participate in the Acute Inpatient Rehabilitation Program and will admit today.  Preadmission Screen Completed By:  Clois Dupes, RN MSN 10/03/2022 3:54 PM ______________________________________________________________________   Discussed status with Dr. Marland Kitchen on *** at *** and received approval for admission today.  Admission Coordinator:  Clois Dupes, RN MSN time Marland KitchenDorna Bloom ***   Assessment/Plan: Diagnosis: Does the need for close, 24 hr/day Medical supervision in concert with the patient's rehab needs make it unreasonable for this patient to be served in a less intensive setting? {yes_no_potentially:3041433} Co-Morbidities requiring supervision/potential  complications: *** Due to {due ZO:1096045}, does the patient require 24 hr/day rehab nursing? {yes_no_potentially:3041433} Does the patient require coordinated care of a physician, rehab nurse, PT, OT, and SLP to address physical and functional deficits in the context of the above medical diagnosis(es)? {yes_no_potentially:3041433} Addressing deficits in the following areas: {deficits:3041436} Can the patient actively participate in an intensive therapy program of at least 3 hrs of therapy 5 days a week? {yes_no_potentially:3041433} The potential for patient to make measurable gains while on inpatient rehab is {potential:3041437} Anticipated functional outcomes upon discharge from inpatient rehab: {functional outcomes:304600100} PT, {functional outcomes:304600100} OT, {functional outcomes:304600100} SLP Estimated rehab length of stay to reach the above functional goals is: *** Anticipated discharge destination: {anticipated dc setting:21604} 10. Overall Rehab/Functional Prognosis: {potential:3041437}   MD Signature: ***

## 2022-10-03 NOTE — Plan of Care (Signed)
  Problem: Education: Goal: Knowledge of disease or condition will improve Outcome: Progressing   Problem: Coping: Goal: Will identify appropriate support needs Outcome: Progressing   Problem: Health Behavior/Discharge Planning: Goal: Ability to manage health-related needs will improve Outcome: Progressing   Problem: Self-Care: Goal: Ability to participate in self-care as condition permits will improve Outcome: Progressing

## 2022-10-03 NOTE — Progress Notes (Signed)
Modified Barium Swallow Study  Patient Details  Name: Amanda [name] MRN: [id number] Date of Birth: 01/09/1919  Today's Date: 10/03/2022  Modified Barium Swallow completed.  Full report located under Chart Review in the Imaging Section.  History of Present Illness Pt is a 89 year old female admitted after a fall and subsequent pain preventing ambulation. Patient was found to have a left hip fracture. Underwnt surgery on 5/15. Reported feeling of food and liquid in throat. Family reports this has been ongoing for a while. She is afraid of choking. Found to ahve had acute CVA perioperatively. MRI shows Acute lacunar infarct in the bilateral corona radiata.   Clinical Impression Pt participatory, but very hard of hearing and a little lethargic this am. Pt able to follow simple commands, but had difficulty staying attentive to follow swallow strategies or comprehend fully though cognition typically WNL. Pt demonstrates a mild oropharyngeal dysphagia with mild anterior spillage due to left facial nerve weakness and mild oral residue likely due to decreased lingual tension.  There were instance of dleayed swallow initaition and penetration before the swallow that was ejected with good vestbublar closure. Trace aspiration after the swallow did occur due to moderate resiude in the pyriform sinuses. Pt was able to sense and clear with a cough and cleared oral and pharyngeal residue with a second swallow. Pts severe and prolonged coughing however was mostly due to stasis of puree in the proximal esophagus. Pt repeatedly coughing and hacking well after airway clear due to primary esophageal dysphagia. Liquid wash very helpful.  Did not attempt mastication of solids due to xerostomia and no dentition at the time of exam as well as severe esophageal retention and prolonged coughing and globus as a result. Typically pt attempts a mechanical soft texture at home and goes out to eat often with family. Given  increased risk of aspiration due to mobility issues after hip fx and pain medication in setting of chronic esophageal dysphagia and mild oropharyngeal dysphagia, will maintain a restricitve diet of puree and nectar thick liquids with aspiration precautions and reflux precautions. Hopeful for upgrade during rehabilitation process. Factors that may increase risk of adverse event in presence of aspiration Rubye Oaks [employer] Clearance Coots 2021): Limited mobility;Aspiration of thick, dense, and/or acidic materials  Swallow Evaluation Recommendations Recommendations: PO diet PO Diet Recommendation: Dysphagia 1 (Pureed);Mildly thick liquids (Level 2, nectar thick) Liquid Administration via: Cup;Straw Medication Administration: Crushed with puree Supervision: Full supervision/cueing for swallowing strategies Swallowing strategies  : Slow rate;Small bites/sips;Follow solids with liquids;Minimize environmental distractions Postural changes: Position pt fully upright for meals;Stay upright 30-60 min after meals Oral care recommendations: Oral care BID (2x/day)      Tequan Redmon, Riley Nearing 10/03/2022,12:24 PM

## 2022-10-03 NOTE — Progress Notes (Signed)
  Echocardiogram 2D Echocardiogram has been performed.  Amanda [name] 10/03/2022, 6:03 PM

## 2022-10-03 NOTE — Progress Notes (Signed)
  Inpatient Rehabilitation Admissions Coordinator   Met with patient, son, Kathlene November, and grandson at bedside for rehab assessment. We discussed goals and expectations of a possible CIR admit. Patient typically lives alone with frequent family check in. Family provide meals and caregiver, Amada Jupiter, comes weekly to assist with showering. Patient dresses her self daily and Mod I with RW but slow in ambulation.. Wears glasses and has bilateral hearing aids. They prefer CIR for rehab. Family can provide expected caregiver support that is recommended of 24/7 supervision to min assist. Diane, dtr, is POA and son, Kathlene November has already contacted care agency to hire caregiver supports after a CIR stay. She is an excellent candidate for CIR. I await medical workup completion and further progress with therapy to admit to CIR. Please call me with any questions.   Ottie Glazier, RN, MSN Rehab Admissions Coordinator [phone]

## 2022-10-03 NOTE — Progress Notes (Addendum)
STROKE TEAM PROGRESS NOTE   INTERVAL HISTORY Her family is at the bedside.  She is sitting up in the bed in no apparent distress.  She is awake alert oriented to self, month, year and place She was noted to have a facial droop on Thursday afternoon and an MRI was obtained and showed bilateral corona radiata ischemic infarcts.  She was not a candidate for TNK due to recent surgery.  She has chronic A-fib on Xarelto which had been held for recent surgery resume these infarcts are in the setting of perioperative and Xarelto being held Recommend restarting Xarelto.  Neurology will sign off.  Please call questions and concerns  Vitals:   10/02/22 2128 10/03/22 0414 10/03/22 0500 10/03/22 0804  BP: (!) 121/47 (!) 90/39 (!) 96/42 (!) 120/37  Pulse: 85 77 78 90  Resp:  16 16 16   Temp: 98 F (36.7 C)   98.9 F (37.2 C)  TempSrc: Axillary   Oral  SpO2: 94% 92% 92% 100%  Weight:      Height:       CBC:  Recent Labs  Lab 09/29/22 1135 10/01/22 0625 10/02/22 0128 10/03/22 0851  WBC 9.2   < > 13.5* 11.3*  NEUTROABS 7.0  --   --   --   HGB 11.2*   < > 8.9* 7.5*  HCT 35.7*   < > 27.1* 24.0*  MCV 103.2*   < > 96.1 101.7*  PLT 190   < > 127* 120*   < > = values in this interval not displayed.   Basic Metabolic Panel:  Recent Labs  Lab 10/02/22 0812 10/02/22 1317 10/03/22 0851  NA 130*  --  131*  K 5.5* 4.7 4.9  CL 97*  --  102  CO2 23  --  22  GLUCOSE 231*  --  183*  BUN 40*  --  45*  CREATININE 1.40*  --  1.39*  CALCIUM 8.1*  --  7.7*   Lipid Panel: No results for input(s): "CHOL", "TRIG", "HDL", "CHOLHDL", "VLDL", "LDLCALC" in the last 168 hours. HgbA1c: No results for input(s): "HGBA1C" in the last 168 hours. Urine Drug Screen: No results for input(s): "LABOPIA", "COCAINSCRNUR", "LABBENZ", "AMPHETMU", "THCU", "LABBARB" in the last 168 hours.  Alcohol Level No results for input(s): "ETH" in the last 168 hours.  IMAGING past 24 hours US RENAL  Result Date:  10/03/2022 CLINICAL DATA:  [id number] AKI (acute kidney injury) (HCC) [id number] EXAM: RENAL / URINARY TRACT ULTRASOUND COMPLETE COMPARISON:  06/13/2009 FINDINGS: Right Kidney: Renal measurements: 8.3 x 3.8 x 4.6 cm = volume: 76.5 mL. No hydronephrosis. Increased renal cortical echogenicity. Left Kidney: Renal measurements: 9.7 x 3.9 x 4.5 cm = volume: 89.0 mL. No hydronephrosis. Increased renal cortical echogenicity. Bladder: Not visualized, presumably decompressed Other: None. IMPRESSION: No hydronephrosis. Increased renal cortical echogenicity bilaterally, as can be seen in medical renal disease. Electronically Signed   By: Caprice Renshaw M.D.   On: 10/03/2022 12:57   DG Swallowing Func-Speech Pathology  Result Date: 10/03/2022 Table formatting from the original result was not included. Modified Barium Swallow Study Patient Details Name: LILLIANNA EPHRAIM MRN: [id number] Date of Birth: 87/17/1920 Today's Date: 10/03/2022 HPI/PMH: HPI: Pt is a 89 year old female admitted after a fall and subsequent pain preventing ambulation. Patient was found to have a left hip fracture. Underwnt surgery on 5/15. Reported feeling of food and liquid in throat. Family reports this has been ongoing for a while. She is afraid of choking.  Found to ahve had acute CVA perioperatively. MRI shows Acute lacunar infarct in the bilateral corona radiata. Clinical Impression: Pt participatory, but very hard of hearing and a little lethargic this am. Pt able to follow simple commands, but had difficulty staying attentive to follow swallow strategies or comprehend fully though cognition typically WNL. Pt demonstrates a mild oropharyngeal dysphagia with mild anterior spillage due to left facial nerve weakness and mild oral residue likely due to decreased lingual tension.  There were instance of dleayed swallow initaition and penetration before the swallow that was ejected with good vestbublar closure. Trace aspiration after the swallow did occur due to  moderate resiude in the pyriform sinuses. Pt was able to sense and clear with a cough and cleared oral and pharyngeal residue with a second swallow. Pts severe and prolonged coughing however was mostly due to stasis of puree in the proximal esophagus. Pt repeatedly coughing and hacking well after airway clear due to primary esophageal dysphagia. Liquid wash very helpful.  Did not attempt mastication of solids due to xerostomia and no dentition at the time of exam as well as severe esophageal retention and prolonged coughing and globus as a result. Typically pt attempts a mechanical soft texture at home and goes out to eat often with family. Given increased risk of aspiration due to mobility issues after hip fx and pain medication in setting of chronic esophageal dysphagia and mild oropharyngeal dysphagia, will maintain a restricitve diet of puree and nectar thick liquids with aspiration precautions and reflux precautions. Hopeful for upgrade during rehabilitation process. Factors that may increase risk of adverse event in presence of aspiration Rubye Oaks & Clearance Coots 2021): Factors that may increase risk of adverse event in presence of aspiration Rubye Oaks & Clearance Coots 2021): Limited mobility; Aspiration of thick, dense, and/or acidic materials Recommendations/Plan: Swallowing Evaluation Recommendations Swallowing Evaluation Recommendations Recommendations: PO diet PO Diet Recommendation: Dysphagia 1 (Pureed); Mildly thick liquids (Level 2, nectar thick) Liquid Administration via: Cup; Straw Medication Administration: Crushed with puree Supervision: Full supervision/cueing for swallowing strategies Swallowing strategies  : Slow rate; Small bites/sips; Follow solids with liquids; Minimize environmental distractions Postural changes: Position pt fully upright for meals; Stay upright 30-60 min after meals Oral care recommendations: Oral care BID (2x/day) Treatment Plan Treatment Plan Treatment recommendations: Therapy as  outlined in treatment plan below Follow-up recommendations: Acute inpatient rehab (3 hours/day) Functional status assessment: Patient has had a recent decline in their functional status and demonstrates the ability to make significant improvements in function in a reasonable and predictable amount of time. Treatment frequency: Min 2x/week Treatment duration: 2 weeks Interventions: Aspiration precaution training; Compensatory techniques; Patient/family education; Trials of upgraded texture/liquids; Diet toleration management by SLP Recommendations Recommendations for follow up therapy are one component of a multi-disciplinary discharge planning process, led by the attending physician.  Recommendations may be updated based on patient status, additional functional criteria and insurance authorization. Assessment: Orofacial Exam: Orofacial Exam Oral Cavity: Oral Hygiene: Xerostomia Oral Cavity - Dentition: Edentulous Anatomy: No data recorded Boluses Administered: Boluses Administered Boluses Administered: Mildly thick liquids (Level 2, nectar thick); Thin liquids (Level 0); Puree; Solid  Oral Impairment Domain: Oral Impairment Domain Lip Closure: Escape beyond mid-chin Tongue control during bolus hold: Escape to lateral buccal cavity/floor of mouth Bolus preparation/mastication: -- (solids NT) Oral residue: Residue collection on oral structures Location of oral residue : Tongue; Palate Initiation of pharyngeal swallow : Pyriform sinuses  Pharyngeal Impairment Domain: Pharyngeal Impairment Domain Soft palate elevation: No bolus between soft palate (SP)/pharyngeal  wall (PW) Laryngeal elevation: Complete superior movement of thyroid cartilage with complete approximation of arytenoids to epiglottic petiole Anterior hyoid excursion: Complete anterior movement Epiglottic movement: Complete inversion Laryngeal vestibule closure: Complete, no air/contrast in laryngeal vestibule Pharyngeal stripping wave : Present - complete  Pharyngoesophageal segment opening: Complete distension and complete duration, no obstruction of flow Tongue base retraction: Trace column of contrast or air between tongue base and PPW Pharyngeal residue: Collection of residue within or on pharyngeal structures Location of pharyngeal residue: Valleculae; Pyriform sinuses  Esophageal Impairment Domain: Esophageal Impairment Domain Esophageal clearance upright position: Esophageal retention with retrograde flow below pharyngoesophageal segment (PES); Esophageal retention Pill: Esophageal Impairment Domain Esophageal clearance upright position: Esophageal retention with retrograde flow below pharyngoesophageal segment (PES); Esophageal retention Penetration/Aspiration Scale Score: No data recorded Compensatory Strategies: Compensatory Strategies Compensatory strategies: No   General Information: Caregiver present: No  Diet Prior to this Study: Dysphagia 1 (pureed); Mildly thick liquids (Level 2, nectar thick)   Temperature : Normal   No data recorded  Supplemental O2: None (Room air)   History of Recent Intubation: Yes  Behavior/Cognition: Alert; Cooperative; Pleasant mood (hard of hearing) Self-Feeding Abilities: Able to self-feed Baseline vocal quality/speech: Normal Volitional Cough: Able to elicit Volitional Swallow: Able to elicit No data recorded Goal Planning: Prognosis for improved oropharyngeal function: Good No data recorded No data recorded Patient/Family Stated Goal: rehab No data recorded Pain: Pain Assessment Pain Assessment: 0-10 Pain Score: 10 Pain Location: L hip Pain Descriptors / Indicators: Discomfort; Grimacing; Guarding Pain Intervention(s): Monitored during session; Limited activity within patient's tolerance; Repositioned End of Session: Start Time:SLP Start Time (ACUTE ONLY): 1478 Stop Time: SLP Stop Time (ACUTE ONLY): 0940 Time Calculation:SLP Time Calculation (min) (ACUTE ONLY): 15 min Charges: SLP Evaluations $ SLP Speech Visit: 1 Visit SLP  Evaluations $BSS Swallow: 1 Procedure $MBS Swallow: 1 Procedure $Swallowing Treatment: 1 Procedure SLP visit diagnosis: SLP Visit Diagnosis: Dysphagia, oropharyngeal phase (R13.12) Past Medical History: Past Medical History: Diagnosis Date  Carpal tunnel syndrome   Chronic anticoagulation   Chronic atrial fibrillation (HCC)   Diabetes mellitus   NON-INSULIN  Hypothyroidism   Sinus congestion  Past Surgical History: Past Surgical History: Procedure Laterality Date  APPENDECTOMY    CARDIOVASCULAR STRESS TEST  07/10/2004  EF 81%  CHOLECYSTECTOMY    INTRAMEDULLARY (IM) NAIL INTERTROCHANTERIC Left 10/01/2022  Procedure: INTRAMEDULLARY (IM) NAIL INTERTROCHANTERIC;  Surgeon: Tarry Kos, MD;  Location: [facility] OR;  Service: Orthopedics;  Laterality: Left;  US ECHOCARDIOGRAPHY  12/06/2007  EF 55-60% DeBlois, Riley Nearing 10/03/2022, 12:26 PM  MR BRAIN WO CONTRAST  Result Date: 10/02/2022 CLINICAL DATA:  Neuro deficit with acute stroke suspected EXAM: MRI HEAD WITHOUT CONTRAST TECHNIQUE: Multiplanar, multiecho pulse sequences of the brain and surrounding structures were obtained without intravenous contrast. COMPARISON:  Head CT from 3 days ago FINDINGS: Brain: Small acute infarcts in the bilateral corona radiata. Although that bilateral and somewhat symmetric in size and location these are not at the basal ganglia to imply a global/toxic cause. There is chronic small vessel ischemia in the cerebral white matter to a mild degree for age. No acute hemorrhage, hydrocephalus, or collection. Cerebral volume loss that is mild for age. Few chronic microhemorrhages mainly in the deep brain , likely microvascular ischemic. Vascular: Normal flow voids. Skull and upper cervical spine: Normal marrow signal. Sinuses/Orbits: Negative. IMPRESSION: 1. Acute lacunar infarct in the bilateral corona radiata. 2. Generalized atrophy and chronic small vessel ischemia. Electronically Signed   By: Audry Riles.D.  On: 10/02/2022 19:27     PHYSICAL EXAM  Temp:  [98 F (36.7 C)-98.9 F (37.2 C)] 98.9 F (37.2 C) (05/17 0804) Pulse Rate:  [77-90] 90 (05/17 0804) Resp:  [16] 16 (05/17 0804) BP: (90-121)/(37-47) 120/37 (05/17 0804) SpO2:  [92 %-100 %] 100 % (05/17 0804)  General -elderly female in no apparent distress Cardiovascular -irregularly irregular  Mental Status -  Level of arousal and orientation to time, place, and person were intact.  Follows commands.  No aphasia, dysarthric Language including expression, naming, repetition, comprehension was assessed and found intact.  Cranial Nerves II - XII - II -left lower quadrant field deficit  III, IV, VI - Extraocular movements intact. V - Facial sensation intact bilaterally. VII - left facial VIII - Hearing & vestibular intact bilaterally. X - Palate elevates symmetrically. XI - Chin turning & shoulder shrug intact bilaterally. XII - Tongue protrusion intact.  Motor Strength - bilateral uppers 4/5, right lower 4/5, left lower able to lift off bed but not hold due to pain from hip surgery Motor Tone - Muscle tone was assessed at the neck and appendages and was normal. Sensory - Light touch, temperature/pinprick were assessed and were symmetrical.    Coordination -left arm ataxia  Gait and Station - deferred.  ASSESSMENT/PLAN Ms. Oleatha Luberda Goffe is a 89 y.o. female with history of chronic atrial fibrillation, diabetes mellitus, hypothyroidism. Patient presented after a fall at home and subsequent pain preventing ambulation and admitted on 5/13. On 5/15 she underwent an  open treatment of intertrochanteric, pertrochanteric and subtrochanteric fracture with intramedullary implant. On Thursday afternoon, new slurred speech and facial droop were noted; on review of her post-surgery course, slurred speech had also been noted after surgery on 5/15, which had been attributed to anesthesia. The facial droop noted Thursday   Stroke: Acute bilateral corona radiata  ischemic infarcts, etiology:  cardioembolic in the setting of Xarelto being held for surgery  MRI  Acute lacunar infarct in the bilateral corona radiata. Generalized atrophy and chronic small vessel ischemia. MRA pending Carotid dopplers pending 2D Echo pending LDL pending HgbA1c 6.4 in 05/6107 VTE prophylaxis -Xarelto Xarelto (rivaroxaban) daily prior to admission, now on Xarelto (rivaroxaban) daily.  Therapy recommendations: SNF Disposition: Pending  Hypertension Chronic A-fib on Xarelto Home meds: Lisinopril 20 mg, Xarelto resumed Stable BP goal < 180/105 on AC Long-term BP goal normotensive  Hyperlipidemia Home meds: None LDL pending, goal < 70 Consider statin if needed  Diabetes type II Controlled Home meds: Amaryl 1 mg HgbA1c 6.4 in 08/2022, goal < 7.0 CBGs SSI  Other Stroke Risk Factors Advanced Age >/= 6   Other Active Problems Hypothyroidism Left hip fracture s/p surgery AKI on CKD 3b  Hospital day # 4  Gevena Mart DNP, ACNPC-AG  [facility]  ATTENDING NOTE: I reviewed above note and agree with the assessment and plan. Pt was seen and examined.   Son at the bedside. Pt lying in bed, awake alert and fully orientated, still has left facial droop and moderate dysarthria, but moving BUEs and RLE. Still has pain at left hip, but wiggle toes bilaterally symmetrical. MRI showed b/l CR infarct, still embolic pattern, probably from holding off Xarelto for procedure. Ortho is ok to put her back on, will resume. Will continue to finish her other stroke work up including MRA and CUS. Pending LDL and consider statin if needed. Continue PT/OT. Will follow.  For detailed assessment and plan, please refer to above/below as I have made changes  wherever appropriate. I had long discussion with son at bedside, updated pt current condition, treatment plan and potential prognosis, and answered all the questions. He expressed understanding and appreciation.    Marvel Plan, MD PhD Stroke Neurology 10/03/2022 4:55 PM     To contact Stroke Continuity provider, please refer to [email or web address]. After hours, contact General Neurology

## 2022-10-03 NOTE — Progress Notes (Signed)
Physical Therapy Treatment Patient Details Name: Amanda [name] MRN: [id number] DOB: [birth year] Today's Date: 10/03/2022   History of Present Illness Pt is a 89 y/o F admitted on 09/29/22 after presenting with c/o fall & inability to ambulate. Pt found to have L pertrochanteric hip fx. Pt is s/p L IM nail on 10/01/22. Pt also noted to have small R medial malleolus ankle fx not requiring surgical attention. PMH: chronic a-fib, DM, hypothyroidism    PT Comments    Pt received in supine and agreeable to session with family present and supportive. Pt continuing to require significant assist for all mobility tasks due to pain, weakness, and fatigue. Pt reporting motivation to participate in mobility tasks, however she is "tired". Pt able to stand x2 and pivot to recliner with max A +2 and demonstrating limited standing tolerance. Pt assisted in repositioning LLE and set up to eat per family request. Family expressing concerns about pt's limited eating and requesting information on restrictions. Family was given the instructions listed in SLP's note from this morning. Pt continues to benefit from PT services to progress toward functional mobility goals.      Recommendations for follow up therapy are one component of a multi-disciplinary discharge planning process, led by the attending physician.  Recommendations may be updated based on patient status, additional functional criteria and insurance authorization.     Assistance Recommended at Discharge Frequent or constant Supervision/Assistance  Patient can return home with the following Two people to help with bathing/dressing/bathroom;Two people to help with walking and/or transfers;Help with stairs or ramp for entrance;Direct supervision/assist for medications management;Assistance with feeding;Assist for transportation;Assistance with cooking/housework;Direct supervision/assist for financial management   Equipment Recommendations  None recommended  by PT    Recommendations for Other Services       Precautions / Restrictions Precautions Precautions: Fall Restrictions Weight Bearing Restrictions: Yes RLE Weight Bearing: Weight bearing as tolerated LLE Weight Bearing: Weight bearing as tolerated     Mobility  Bed Mobility Overal bed mobility: Needs Assistance Bed Mobility: Supine to Sit     Supine to sit: Max assist, +2 for physical assistance, HOB elevated     General bed mobility comments: Pt able to assist with RLE advancement to EOB, however required max A +2 for LLE management, trunk elevation, and scooting forward at EOB    Transfers Overall transfer level: Needs assistance Equipment used: Rolling walker (2 wheels) Transfers: Sit to/from Stand, Bed to chair/wheelchair/BSC Sit to Stand: Max assist, +2 physical assistance, From elevated surface Stand pivot transfers: Max assist, +2 physical assistance         General transfer comment: From EOB x2 with max A +2 for power up and to maintain upright posture due to pt flexing forward. Attempted step pivot, however pt unable to step with RLE. Pt requring max A +2 to stand pivot to recliner due to pain and fatigue.    Ambulation/Gait               General Gait Details: unable       Balance Overall balance assessment: Needs assistance Sitting-balance support: Bilateral upper extremity supported, Feet supported Sitting balance-Leahy Scale: Poor Sitting balance - Comments: Sitting EOB with posterior lean requiring assist to maintain balance   Standing balance support: Bilateral upper extremity supported, During functional activity, Reliant on assistive device for balance Standing balance-Leahy Scale: Zero Standing balance comment: With RW support. Pt requiring assist to maintain standing  Cognition Arousal/Alertness: Awake/alert Behavior During Therapy: WFL for tasks assessed/performed Overall Cognitive Status:  Within Functional Limits for tasks assessed                                          Exercises      General Comments        Pertinent Vitals/Pain Pain Assessment Pain Assessment: Faces Faces Pain Scale: Hurts whole lot Pain Location: L hip Pain Descriptors / Indicators: Discomfort, Grimacing, Guarding Pain Intervention(s): Limited activity within patient's tolerance, Monitored during session, Repositioned     PT Goals (current goals can now be found in the care plan section) Acute Rehab PT Goals Patient Stated Goal: return to PLOF PT Goal Formulation: With patient/family Time For Goal Achievement: 10/16/22 Potential to Achieve Goals: Fair Progress towards PT goals: Progressing toward goals    Frequency    Min 3X/week      PT Plan Current plan remains appropriate       AM-PAC PT "6 Clicks" Mobility   Outcome Measure  Help needed turning from your back to your side while in a flat bed without using bedrails?: Total Help needed moving from lying on your back to sitting on the side of a flat bed without using bedrails?: Total Help needed moving to and from a bed to a chair (including a wheelchair)?: Total Help needed standing up from a chair using your arms (e.g., wheelchair or bedside chair)?: Total Help needed to walk in hospital room?: Total Help needed climbing 3-5 steps with a railing? : Total 6 Click Score: 6    End of Session Equipment Utilized During Treatment: Gait belt Activity Tolerance: Patient limited by pain;Patient limited by fatigue Patient left: in chair;with call bell/phone within reach;with family/visitor present Nurse Communication: Mobility status PT Visit Diagnosis: Pain;Muscle weakness (generalized) (M62.81);Other abnormalities of gait and mobility (R26.89);Difficulty in walking, not elsewhere classified (R26.2);Unsteadiness on feet (R26.81) Pain - Right/Left: Left Pain - part of body: Hip     Time: 1610-9604 PT Time  Calculation (min) (ACUTE ONLY): 45 min  Charges:  $Therapeutic Activity: 38-52 mins                     Johny Shock, PTA Acute Rehabilitation Services Secure Chat Preferred  [phone]    Johny Shock 10/03/2022, 3:15 PM

## 2022-10-03 NOTE — Plan of Care (Signed)
  Problem: Education: Goal: Ability to describe self-care measures that may prevent or decrease complications (Diabetes Survival Skills Education) will improve Outcome: Progressing Goal: Individualized Educational Video(s) Outcome: Progressing   Problem: Coping: Goal: Ability to adjust to condition or change in health will improve Outcome: Progressing   Problem: Fluid Volume: Goal: Ability to maintain a balanced intake and output will improve Outcome: Progressing   Problem: Health Behavior/Discharge Planning: Goal: Ability to identify and utilize available resources and services will improve Outcome: Progressing Goal: Ability to manage health-related needs will improve Outcome: Progressing   Problem: Metabolic: Goal: Ability to maintain appropriate glucose levels will improve Outcome: Progressing   Problem: Nutritional: Goal: Maintenance of adequate nutrition will improve Outcome: Progressing Goal: Progress toward achieving an optimal weight will improve Outcome: Progressing   Problem: Skin Integrity: Goal: Risk for impaired skin integrity will decrease Outcome: Progressing   Problem: Tissue Perfusion: Goal: Adequacy of tissue perfusion will improve Outcome: Progressing   Problem: Education: Goal: Knowledge of General Education information will improve Description: Including pain rating scale, medication(s)/side effects and non-pharmacologic comfort measures Outcome: Progressing   Problem: Health Behavior/Discharge Planning: Goal: Ability to manage health-related needs will improve Outcome: Progressing   Problem: Clinical Measurements: Goal: Ability to maintain clinical measurements within normal limits will improve Outcome: Progressing Goal: Will remain free from infection Outcome: Progressing Goal: Diagnostic test results will improve Outcome: Progressing Goal: Respiratory complications will improve Outcome: Progressing Goal: Cardiovascular complication will  be avoided Outcome: Progressing   Problem: Activity: Goal: Risk for activity intolerance will decrease Outcome: Progressing   Problem: Nutrition: Goal: Adequate nutrition will be maintained Outcome: Progressing   Problem: Coping: Goal: Level of anxiety will decrease Outcome: Progressing   Problem: Elimination: Goal: Will not experience complications related to bowel motility Outcome: Progressing Goal: Will not experience complications related to urinary retention Outcome: Progressing   Problem: Pain Managment: Goal: General experience of comfort will improve Outcome: Progressing   Problem: Safety: Goal: Ability to remain free from injury will improve Outcome: Progressing   Problem: Skin Integrity: Goal: Risk for impaired skin integrity will decrease Outcome: Progressing   Problem: Education: Goal: Verbalization of understanding the information provided (i.e., activity precautions, restrictions, etc) will improve Outcome: Progressing Goal: Individualized Educational Video(s) Outcome: Progressing   Problem: Activity: Goal: Ability to ambulate and perform ADLs will improve Outcome: Progressing   Problem: Clinical Measurements: Goal: Postoperative complications will be avoided or minimized Outcome: Progressing   Problem: Self-Concept: Goal: Ability to maintain and perform role responsibilities to the fullest extent possible will improve Outcome: Progressing   Problem: Pain Management: Goal: Pain level will decrease Outcome: Progressing   Problem: Education: Goal: Knowledge of disease or condition will improve Outcome: Progressing Goal: Knowledge of secondary prevention will improve (MUST DOCUMENT ALL) Outcome: Progressing Goal: Knowledge of patient specific risk factors will improve Loraine Leriche N/A or DELETE if not current risk factor) Outcome: Progressing   Problem: Ischemic Stroke/TIA Tissue Perfusion: Goal: Complications of ischemic stroke/TIA will be  minimized Outcome: Progressing

## 2022-10-04 DIAGNOSIS — S72142A Displaced intertrochanteric fracture of left femur, initial encounter for closed fracture: Secondary | ICD-10-CM | POA: Diagnosis not present

## 2022-10-04 LAB — BASIC METABOLIC PANEL
Anion gap: 9 (ref 5–15)
BUN: 47 mg/dL — ABNORMAL HIGH (ref 8–23)
CO2: 20 mmol/L — ABNORMAL LOW (ref 22–32)
Calcium: 7.9 mg/dL — ABNORMAL LOW (ref 8.9–10.3)
Chloride: 103 mmol/L (ref 98–111)
Creatinine, Ser: 1.28 mg/dL — ABNORMAL HIGH (ref 0.44–1.00)
GFR, Estimated: 37 mL/min — ABNORMAL LOW (ref >60)
Glucose, Bld: 172 mg/dL — ABNORMAL HIGH (ref 70–99)
Potassium: 4.9 mmol/L (ref 3.5–5.1)
Sodium: 132 mmol/L — ABNORMAL LOW (ref 135–145)

## 2022-10-04 LAB — GLUCOSE, CAPILLARY
Glucose-Capillary: 161 mg/dL — ABNORMAL HIGH (ref 70–99)
Glucose-Capillary: 154 mg/dL — ABNORMAL HIGH (ref 70–99)
Glucose-Capillary: 114 mg/dL — ABNORMAL HIGH (ref 70–99)
Glucose-Capillary: 112 mg/dL — ABNORMAL HIGH (ref 70–99)

## 2022-10-04 LAB — CBC
HCT: 23.2 % — ABNORMAL LOW (ref 36.0–46.0)
Hemoglobin: 7.4 g/dL — ABNORMAL LOW (ref 12.0–15.0)
MCH: 32 pg (ref 26.0–34.0)
MCHC: 31.9 g/dL (ref 30.0–36.0)
MCV: 100.4 fL — ABNORMAL HIGH (ref 80.0–100.0)
Platelets: 134 10*3/uL — ABNORMAL LOW (ref 150–400)
RBC: 2.31 MIL/uL — ABNORMAL LOW (ref 3.87–5.11)
RDW: 15.4 % (ref 11.5–15.5)
WBC: 12.1 10*3/uL — ABNORMAL HIGH (ref 4.0–10.5)
nRBC: 0 % (ref 0.0–0.2)

## 2022-10-04 LAB — LIPID PANEL
Cholesterol: 134 mg/dL (ref 0–200)
HDL: 48 mg/dL (ref >40)
LDL Cholesterol: 68 mg/dL (ref 0–99)
Total CHOL/HDL Ratio: 2.8 RATIO
Triglycerides: 91 mg/dL (ref <150)
VLDL: 18 mg/dL (ref 0–40)

## 2022-10-04 MED ORDER — DOCUSATE SODIUM 50 MG/5ML PO LIQD
100.0000 mg | Freq: Two times a day (BID) | ORAL | Status: DC
  Administered 2022-10-04 – 2022-10-06 (×5): 100 mg via ORAL
  Filled 2022-10-04 (×5): qty 10

## 2022-10-04 MED ORDER — ENSURE ENLIVE PO LIQD
237.0000 mL | Freq: Two times a day (BID) | ORAL | Status: DC
  Administered 2022-10-04 – 2022-10-06 (×4): 237 mL via ORAL

## 2022-10-04 MED ORDER — POLYETHYLENE GLYCOL 3350 17 G PO PACK: 17.0000 g | PACK | Freq: Every day | ORAL | Status: DC

## 2022-10-04 MED ORDER — GLYCERIN (LAXATIVE) 2 G RE SUPP
1.0000 | Freq: Every day | RECTAL | Status: DC | PRN
  Administered 2022-10-05 – 2022-10-06 (×2): 1 via RECTAL
  Filled 2022-10-04 (×4): qty 1

## 2022-10-04 MED ORDER — ROSUVASTATIN CALCIUM 5 MG PO TABS
5.0000 mg | ORAL_TABLET | Freq: Every day | ORAL | Status: DC
  Administered 2022-10-04 – 2022-10-06 (×3): 5 mg via ORAL
  Filled 2022-10-04 (×3): qty 1

## 2022-10-04 MED ORDER — ACETAMINOPHEN 160 MG/5ML PO SOLN
500.0000 mg | Freq: Four times a day (QID) | ORAL | Status: DC | PRN
  Filled 2022-10-04: qty 20.3

## 2022-10-04 NOTE — Progress Notes (Signed)
Patient has been drowsy all morning.  She woke up after being transferred to the chair by PT.  She has a congested cough that has presented each time she takes nectar thickened liquids or applesauce.  Notified MD, and told to report to SLP.  Pt is also complaining of pain.  Family is asking that she only have tylenol at this time, because she gets so drowsy.

## 2022-10-04 NOTE — Progress Notes (Signed)
Occupational Therapy Treatment Patient Details Name: Amanda [name] MRN: [id number] DOB: 19-Aug-1918 Today's Date: 10/04/2022   History of present illness Pt is a 89 y/o F admitted on 09/29/22 after presenting with c/o fall & inability to ambulate. Pt found to have L pertrochanteric hip fx. Pt is s/p L IM nail on 10/01/22. Pt also noted to have small R medial malleolus ankle fx not requiring surgical attention. PMH: chronic a-fib, DM, hypothyroidism   OT comments  Pt making slow progress with functional goals, however is motivated, pleasant and cooperative. Pt limited by pain and fatigue this session. Pt sat EOB max A with min A to maintain sitting balance for simple grooming and UB dressing tasks. Sit - stand x 2 trials from EOB max A, total A to SPT to recliner. Pt's family present and very supportive. OT will continue to follow acutely to maximize level of function and safety   Recommendations for follow up therapy are one component of a multi-disciplinary discharge planning process, led by the attending physician.  Recommendations may be updated based on patient status, additional functional criteria and insurance authorization.    Assistance Recommended at Discharge Frequent or constant Supervision/Assistance  Patient can return home with the following  A lot of help with walking and/or transfers;A lot of help with bathing/dressing/bathroom;Assistance with cooking/housework;Assistance with feeding;Assist for transportation;Help with stairs or ramp for entrance   Equipment Recommendations  Other (comment) (TBD at next venue of care)    Recommendations for Other Services      Precautions / Restrictions Precautions Precautions: Fall Restrictions Weight Bearing Restrictions: Yes LLE Weight Bearing: Weight bearing as tolerated       Mobility Bed Mobility Overal bed mobility: Needs Assistance       Supine to sit: Max assist     General bed mobility comments: Pt able to assist  with RLE advancement to EOB, however required max A  for L LE management, trunk elevation, and scooting forward at EOB, used be dpad to hep scoot hips forward. Pt required min A for UB/sitting balance and increased time to progress to min guard A    Transfers Overall transfer level: Needs assistance Equipment used: Rolling walker (2 wheels) Transfers: Sit to/from Stand, Bed to chair/wheelchair/BSC Sit to Stand: Max assist Stand pivot transfers: Total assist         General transfer comment: Sit - stand from EOB x 2 trials max A, total A to SPT to recliner. Lift pad under pt for nursing staff to safely return back to bed later     Balance Overall balance assessment: Needs assistance Sitting-balance support: Bilateral upper extremity supported, Feet supported Sitting balance-Leahy Scale: Poor Sitting balance - Comments: Sitting EOB with posterior lean requiring assist to maintain balance Postural control: Posterior lean, Right lateral lean Standing balance support: Bilateral upper extremity supported, During functional activity, Reliant on assistive device for balance Standing balance-Leahy Scale: Zero                             ADL either performed or assessed with clinical judgement   ADL Overall ADL's : Needs assistance/impaired     Grooming: Wash/dry hands;Wash/dry face;Min guard;Sitting           Upper Body Dressing : Moderate assistance;Sitting       Toilet Transfer: Total assistance;Maximal assistance Toilet Transfer Details (indicate cue type and reason): simulated to recliner Toileting- Clothing Manipulation and Hygiene: Total assistance  Functional mobility during ADLs: Maximal assistance;Total assistance      Extremity/Trunk Assessment Upper Extremity Assessment Upper Extremity Assessment: Generalized weakness   Lower Extremity Assessment Lower Extremity Assessment: Defer to PT evaluation   Cervical / Trunk Assessment Cervical /  Trunk Assessment: Kyphotic    Vision Baseline Vision/History: 1 Wears glasses Ability to See in Adequate Light: 0 Adequate Patient Visual Report: No change from baseline     Perception     Praxis      Cognition Arousal/Alertness: Awake/alert Behavior During Therapy: WFL for tasks assessed/performed Overall Cognitive Status: Within Functional Limits for tasks assessed                                          Exercises      Shoulder Instructions       General Comments      Pertinent Vitals/ Pain       Pain Assessment Pain Assessment: Faces Faces Pain Scale: Hurts even more Pain Location: L hip Pain Descriptors / Indicators: Discomfort, Grimacing, Guarding Pain Intervention(s): Limited activity within patient's tolerance, Monitored during session, Repositioned  Home Living                                          Prior Functioning/Environment              Frequency  Min 2X/week        Progress Toward Goals  OT Goals(current goals can now be found in the care plan section)  Progress towards OT goals: Progressing toward goals     Plan Discharge plan remains appropriate    Co-evaluation                 AM-PAC OT "6 Clicks" Daily Activity     Outcome Measure   Help from another person eating meals?: A Little Help from another person taking care of personal grooming?: A Little Help from another person toileting, which includes using toliet, bedpan, or urinal?: A Lot Help from another person bathing (including washing, rinsing, drying)?: A Lot Help from another person to put on and taking off regular upper body clothing?: A Little Help from another person to put on and taking off regular lower body clothing?: Total 6 Click Score: 14    End of Session Equipment Utilized During Treatment: Gait belt  OT Visit Diagnosis: Unsteadiness on feet (R26.81);Other abnormalities of gait and mobility (R26.89);Muscle  weakness (generalized) (M62.81);History of falling (Z91.81);Pain Pain - Right/Left: Left Pain - part of body: Hip   Activity Tolerance Patient limited by pain;Patient limited by fatigue   Patient Left in chair;with call bell/phone within reach;with family/visitor present   Nurse Communication          Time: [phone] OT Time Calculation (min): 32 min  Charges: OT General Charges $OT Visit: 1 Visit OT Treatments $Self Care/Home Management : 8-22 mins $Therapeutic Activity: 8-22 mins    Galen Manila 10/04/2022, 1:33 PM

## 2022-10-04 NOTE — Progress Notes (Signed)
Physical Therapy Treatment Patient Details Name: Amanda [name] MRN: [id number] DOB: October 24, 1918 Today's Date: 10/04/2022   History of Present Illness Pt is a 89 y/o F admitted on 09/29/22 after presenting with c/o fall & inability to ambulate. Pt found to have L pertrochanteric hip fx. Pt is s/p L IM nail on 10/01/22. Pt also noted to have small R medial malleolus ankle fx not requiring surgical attention. PMH: chronic a-fib, DM, hypothyroidism    PT Comments    Pt supine in bed after sitting in recliner for 4 hours.  She remains lethargic so supine lateral scoot was used for back to bed.  Pt required rolling to R and L to place and remove pads before and after transfer.     Recommendations for follow up therapy are one component of a multi-disciplinary discharge planning process, led by the attending physician.  Recommendations may be updated based on patient status, additional functional criteria and insurance authorization.  Follow Up Recommendations  Can patient physically be transported by private vehicle: No    Assistance Recommended at Discharge Frequent or constant Supervision/Assistance  Patient can return home with the following Two people to help with bathing/dressing/bathroom;Two people to help with walking and/or transfers;Help with stairs or ramp for entrance;Direct supervision/assist for medications management;Assistance with feeding;Assist for transportation;Assistance with cooking/housework;Direct supervision/assist for financial management   Equipment Recommendations  None recommended by PT    Recommendations for Other Services       Precautions / Restrictions Precautions Precautions: Fall Restrictions Weight Bearing Restrictions: Yes RLE Weight Bearing: Weight bearing as tolerated LLE Weight Bearing: Weight bearing as tolerated     Mobility  Bed Mobility Overal bed mobility: Needs Assistance Bed Mobility: Rolling           General bed mobility  comments: Performed rolling in chair to place blanket under her for lateral supine transfer.  Pt required total +2 for transfers.  Once in bed performed transfer rolling in bed to remove excess blankets and pads under patient.    Transfers Overall transfer level:  (did not perform patient is very lethargic this pm.)                      Ambulation/Gait                   Stairs             Wheelchair Mobility    Modified Rankin (Stroke Patients Only)       Balance Overall balance assessment: Needs assistance Sitting-balance support: Bilateral upper extremity supported, Feet supported Sitting balance-Leahy Scale: Poor Sitting balance - Comments: Sitting edge of chair with posterior lean requiring assist to maintain balance                                    Cognition Arousal/Alertness: Awake/alert Behavior During Therapy: WFL for tasks assessed/performed Overall Cognitive Status: Within Functional Limits for tasks assessed                                 General Comments: Pt noted to have some slurred speech, family reports pt is not enunciating as well as prior to sx, no other focal deficits noted - MD & nurse made aware.        Exercises      General Comments  Pertinent Vitals/Pain Pain Assessment Pain Location: L hip Pain Descriptors / Indicators: Discomfort, Grimacing, Guarding    Home Living     Available Help at Discharge: Family;Available 24 hours/day Type of Home: House                  Prior Function            PT Goals (current goals can now be found in the care plan section) Acute Rehab PT Goals Patient Stated Goal: return to PLOF Potential to Achieve Goals: Fair Progress towards PT goals: Progressing toward goals    Frequency    Min 3X/week      PT Plan Current plan remains appropriate    Co-evaluation              AM-PAC PT "6 Clicks" Mobility   Outcome  Measure  Help needed turning from your back to your side while in a flat bed without using bedrails?: Total Help needed moving from lying on your back to sitting on the side of a flat bed without using bedrails?: Total Help needed moving to and from a bed to a chair (including a wheelchair)?: Total Help needed standing up from a chair using your arms (e.g., wheelchair or bedside chair)?: Total Help needed to walk in hospital room?: Total Help needed climbing 3-5 steps with a railing? : Total 6 Click Score: 6    End of Session Equipment Utilized During Treatment: Gait belt Activity Tolerance: Patient limited by pain;Patient limited by fatigue;Patient limited by lethargy Patient left: in chair;with call bell/phone within reach;with family/visitor present Nurse Communication: Mobility status PT Visit Diagnosis: Pain;Muscle weakness (generalized) (M62.81);Other abnormalities of gait and mobility (R26.89);Difficulty in walking, not elsewhere classified (R26.2);Unsteadiness on feet (R26.81) Pain - Right/Left: Left Pain - part of body: Hip     Time: 1610-9604 PT Time Calculation (min) (ACUTE ONLY): 9 min  Charges:  $Therapeutic Activity: 8-22 mins                     Bonney Leitz , PTA Acute Rehabilitation Services Office [phone]    Florestine Avers 10/04/2022, 5:16 PM

## 2022-10-04 NOTE — Progress Notes (Signed)
Speech Language Pathology Treatment: Dysphagia  Patient Details Name: Amanda [name] MRN: [id number] DOB: 1918-08-13 Today's Date: 10/04/2022 Time: [id number] SLP Time Calculation (min) (ACUTE ONLY): 19 min  Assessment / Plan / Recommendation Clinical Impression  Pt seen for skilled ST treatment of dysphagia following the MD's concerns with the increased s/s of aspiration observed with mildly thick liquid. The was lethragic upon arrival, suspected due to recent medication administration for pain. The RN notes she was not awake enough to eat breakfast this morning, her lunch tray was available during the session. The pt was given trials of nectar thick liquid via straw and via spoon, pt required total assist. Pt had a dry and extensive delayed cough with nectar thick liquids x3, the pt had no immediate wet vocal quality or immediate coughing. The pt requiring repeated cueing from her daughter to reduce her coughing and throat clearing to avoid inflamation of the vocal cords. The pt also required repeated cueing to adequately draw liquid through the straw and had noted difficulty building enough pressure to drink the liquid. Pt had increased success given mod verbal cueing with a spoonful of liquid. Pt given one small bite of puree and had some mild anterioir spillage, no further solids were trialed given lethargy levels. The pt reports that feels uncomfortable to drink but requested water several times, pt reports xerostomia when asked. Pt's family given education of MBSS results and options for diet modification and compensatory strategies to reduce liklihood of aspiration event. Per MBSS reports, the pt is having significant esophageal dysmotility and has instances of penetration of reflux, which is likely what the pt is coughing to with PO trials. Honey thick liquid not adviseable at this time given the pt's risk for dehydration and concerns for adverse pulmonary effects given high risk for apiration.  Given the pts aversion to PO intake and current lethargy levels, safest diet remains Dys 1 (puree)/Mildly thick liquid (nectar) with STRICT aspiration precuations (stay upright for meals and at least 30 minutes following, small bites and sips, alternation of solids and liquids, eat/drink slowly, full assist with meals) and frequent oral care. It is extremely important that the pt is awake and alert for ALL PO intake. SLP to check on the pt's PO intake again when the pt is less lethargic.   HPI HPI: Pt is a 89 year old female admitted after a fall and subsequent pain preventing ambulation. Patient was found to have a left hip fracture. Underwnt surgery on 5/15. Reported feeling of food and liquid in throat. Family reports this has been ongoing for a while. She is afraid of choking. Found to have had acute CVA perioperatively. MRI shows Acute lacunar infarct in the bilateral corona radiata.      SLP Plan  Continue with current plan of care  Patient needs continued Speech Lanaguage Pathology Services   Recommendations for follow up therapy are one component of a multi-disciplinary discharge planning process, led by the attending physician.  Recommendations may be updated based on patient status, additional functional criteria and insurance authorization.    Recommendations  Diet recommendations: Nectar-thick liquid;Dysphagia 1 (puree) Liquids provided via: Teaspoon;Straw;Cup (Cup appropriate given the pt is awake/alert) Medication Administration: Crushed with puree Supervision: Full supervision/cueing for compensatory strategies;Staff to assist with self feeding;Trained caregiver to feed patient Compensations: Follow solids with liquid;Slow rate;Small sips/bites;Minimize environmental distractions Postural Changes and/or Swallow Maneuvers: Seated upright 90 degrees  Oral care BID   Frequent or constant Supervision/Assistance Dysphagia, oropharyngeal phase  (R13.12);Cognitive communication deficit (R41.841);Dysarthria and anarthria (R47.1)     Continue with current plan of care     Dione Housekeeper M.S. CF-SLP

## 2022-10-04 NOTE — Evaluation (Signed)
Speech Language Pathology Evaluation Patient Details Name: Amanda [name] MRN: [id number] DOB: Nov 11, 1918 Today's Date: 10/04/2022 Time: 1340-1400 SLP Time Calculation (min) (ACUTE ONLY): 20 min  Problem List:  Patient Active Problem List   Diagnosis Date Noted   CVA (cerebral vascular accident) (HCC) 10/03/2022   Displaced intertrochanteric fracture of left femur, initial encounter for closed fracture (HCC) 10/01/2022   Hip fracture (HCC) 09/29/2022   Restless leg syndrome 09/29/2022   DNR (do not resuscitate) 09/29/2022   Pulmonary edema 03/18/2022   Normocytic anemia 03/18/2022   Acute respiratory failure with hypoxia (HCC) 03/18/2022   Chronic a-fib (HCC) 01/05/2019   Dysarthria 01/02/2019   Aphasia 01/01/2019   HTN (hypertension) 01/01/2019   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 09/28/2015   Anemia 09/28/2015   Vertigo    Chronic anticoagulation 05/24/2014   Permanent atrial fibrillation (HCC) 05/23/2014   Diabetes mellitus type 2, controlled (HCC) 05/23/2014   Slurred speech    Encounter for therapeutic drug monitoring 08/19/2013   Benign hypertensive heart disease without heart failure 10/09/2010   Hypothyroidism 10/09/2010   Atrial fibrillation (HCC) 09/03/2010   Past Medical History:  Past Medical History:  Diagnosis Date   Carpal tunnel syndrome    Chronic anticoagulation    Chronic atrial fibrillation (HCC)    Diabetes mellitus    NON-INSULIN   Hypothyroidism    Sinus congestion    Past Surgical History:  Past Surgical History:  Procedure Laterality Date   APPENDECTOMY     CARDIOVASCULAR STRESS TEST  07/10/2004   EF 81%   CHOLECYSTECTOMY     INTRAMEDULLARY (IM) NAIL INTERTROCHANTERIC Left 10/01/2022   Procedure: INTRAMEDULLARY (IM) NAIL INTERTROCHANTERIC;  Surgeon: Tarry Kos, MD;  Location: [facility] OR;  Service: Orthopedics;  Laterality: Left;   US ECHOCARDIOGRAPHY  12/06/2007   EF 55-60%   HPI:  Pt is a 89 year old female admitted after a fall  and subsequent pain preventing ambulation. Patient was found to have a left hip fracture. Underwnt surgery on 5/15. Reported feeling of food and liquid in throat. Family reports this has been ongoing for a while. She is afraid of choking. Found to have had acute CVA perioperatively. MRI shows Acute lacunar infarct in the bilateral corona radiata.   Assessment / Plan / Recommendation Clinical Impression  Pt seen for speech/language and cognition evaluation. Upon arrival, the pt was recieving pain meds from RN due to increased pain. Given the pt's lethargy status and pain levels, more formal speech/language and cognition not appropriate at this time. The pt's daughter and two sons were present t/o the session. The pt was very lethargic upon arrival and required repeated redirection to the conversation at hand, however this may be due to pain meds compared to deficits. Pt able to answer orientation questions, oriented to month, day of week, city, state, and location, pt said year was 2022. Pt reports (when asked) that her thinking skills feel different but that her speech is very different than her baseline. Pt able to follow one step directions min given cues. The pt also answered all questions asked when given delayed processing speed. Pt's speech was slow and in one word utterances and at most short phrases. The pt's vocal quality was dry and scratchy, suspected due to lack of hydration and chronic throat clearing and coughing. The pt's articulation was moderately dysarthric, and her intelligibility was approx 60-75%.The pts family reports that her cognitive function is completely [facility] and that her speech is the primary  concern. The pt's family reports she has a lot of frustration with people not understanding her but that her family is usually able to determine what she's saying. Pt's daughter reports having this increase in pain and decrease in energy upon transfer to their current bed on 5N. The pt presents  with a possible MCI and moderate dysarthria following her CVA and fall, however, the pts pain levels and lethargy may have affected these results and further assessment and treatment is required to determine cog and speech baseline.    SLP Assessment  SLP Recommendation/Assessment: Patient needs continued Speech Lanaguage Pathology Services SLP Visit Diagnosis: Dysphagia, oropharyngeal phase (R13.12);Cognitive communication deficit (R41.841);Dysarthria and anarthria (R47.1)    Recommendations for follow up therapy are one component of a multi-disciplinary discharge planning process, led by the attending physician.  Recommendations may be updated based on patient status, additional functional criteria and insurance authorization.       Assistance Recommended at Discharge  Frequent or constant Supervision/Assistance  Functional Status Assessment Patient has had a recent decline in their functional status and demonstrates the ability to make significant improvements in function in a reasonable and predictable amount of time.  Frequency and Duration min 2x/week         SLP Evaluation Cognition  Overall Cognitive Status: Within Functional Limits for tasks assessed Arousal/Alertness: Lethargic (pt lethargic suspected due to both medications and pain) Orientation Level: Oriented X4;Oriented to place;Oriented to time;Oriented to person;Oriented to situation Year: 2022 Month: May Day of Week: Correct Attention: Focused Focused Attention: Impaired Focused Attention Impairment: Functional basic (suspected due to lethargy levels) Memory: Appears intact Awareness: Appears intact Problem Solving:  (unable to determine due to lethargy levels) Safety/Judgment:  (unable to determine due to lethargy levels)       Comprehension  Auditory Comprehension Yes/No Questions: Within Functional Limits Commands: Within Functional Limits Conversation: Simple Interfering Components:  Pain;Hearing EffectiveTechniques: Extra processing time Visual Recognition/Discrimination Discrimination: Not tested Reading Comprehension Reading Status: Not tested    Expression Expression Primary Mode of Expression: Verbal Verbal Expression Overall Verbal Expression: Impaired Automatic Speech:  (unable to determine due to lethargy levels) Level of Generative/Spontaneous Verbalization: Phrase;Word Repetition:  (unable to determine due to lethargy levels) Naming: Not tested (unable to determine due to lethargy levels) Pragmatics: No impairment Effective Techniques: Open ended questions Written Expression Dominant Hand: Right Written Expression: Not tested   Oral / Motor  Oral Motor/Sensory Function Overall Oral Motor/Sensory Function: Mild impairment Facial ROM: Within Functional Limits Facial Symmetry: Other (Comment);Suspected CN VII (facial) dysfunction;Abnormal symmetry left Facial Strength: Reduced left Facial Sensation: Within Functional Limits Lingual ROM: Within Functional Limits Lingual Symmetry: Within Functional Limits Lingual Strength: Reduced;Suspected CN XII (hypoglossal) dysfunction Motor Speech Interfering Components: Inadequate dentition            Dione Housekeeper M.S. CF-SLP

## 2022-10-04 NOTE — Progress Notes (Signed)
Nutrition Follow-up  DOCUMENTATION CODES:   Not applicable  INTERVENTION:  - Continue Magic cup TID with meals, each supplement provides 290 kcal and 9 grams of protein  - Add Ensure Enlive po BID, each supplement provides 350 kcal and 20 grams of protein.  - Add MVI q day.   NUTRITION DIAGNOSIS:   Increased nutrient needs related to hip fracture, post-op healing as evidenced by estimated needs.  GOAL:   Patient will meet greater than or equal to 90% of their needs - Ongoing  MONITOR:   PO intake, Supplement acceptance, Labs, Weight trends, I & O's  REASON FOR ASSESSMENT:   Consult Assessment of nutrition requirement/status (Hip/Femur fracture patient)  ASSESSMENT:   89 year old female with PMHx of chronic A-fib, DM, hypothyroidism admitted after a fall found to have left hip fracture, also with possible fracture of right medial malleolus.  Meds reviewed: colace, sliding scale insulin. Labs reviewed: Na low, BUN/creatinine elevated.   MD consult for assessment of nutrition status. RD team already following the patient. RD called pt room and spoke with pt's daughter. She reports that the pt has yet to receive a meal tray. RD will place an order for a lunch tray and have it sent up. RD will also switch pt over to "room service not appropriate." She does report that the pt did well with her dinner tray last night. RD will also add Ensure BID. Will continue to closely monitor PO intakes.   Diet Order:   Diet Order             DIET - DYS 1 Room service appropriate? Yes; Fluid consistency: Nectar Thick  Diet effective now                   EDUCATION NEEDS:   Education needs have been addressed  Skin:  Skin Assessment: Reviewed RN Assessment  Last BM:  09/28/22  Height:   Ht Readings from Last 1 Encounters:  09/29/22 5\' 3"  (1.6 m)    Weight:   Wt Readings from Last 1 Encounters:  09/29/22 68.9 kg    Ideal Body Weight:  52.3 kg  BMI:  Body mass index  is 26.91 kg/m.  Estimated Nutritional Needs:   Kcal:  1500-1700  Protein:  82-92 grams  Fluid:  1.5-1.7 L/day  Bethann Humble, RD, LDN, CNSC.

## 2022-10-04 NOTE — Progress Notes (Signed)
Physical Therapy Treatment Patient Details Name: Amanda [name] MRN: [id number] DOB: 05/27/1918 Today's Date: 10/04/2022   History of Present Illness Pt is a 89 y/o F admitted on 09/29/22 after presenting with c/o fall & inability to ambulate. Pt found to have L pertrochanteric hip fx. Pt is s/p L IM nail on 10/01/22. Pt also noted to have small R medial malleolus ankle fx not requiring surgical attention. PMH: chronic a-fib, DM, hypothyroidism    PT Comments    Pt supine in reclined position in recliner.  She is writhing slowly in pain with facial grimmacing but intermittently falling asleep.  Tx limited to repositioning in recliner chair as she had slid down the recliner quite a bit and was listing to the L.  PTA and Rn repositioned patient for comfort.  Will continue to follow patient during acute stay.      Recommendations for follow up therapy are one component of a multi-disciplinary discharge planning process, led by the attending physician.  Recommendations may be updated based on patient status, additional functional criteria and insurance authorization.  Follow Up Recommendations  Can patient physically be transported by private vehicle: No    Assistance Recommended at Discharge Frequent or constant Supervision/Assistance  Patient can return home with the following Two people to help with bathing/dressing/bathroom;Two people to help with walking and/or transfers;Help with stairs or ramp for entrance;Direct supervision/assist for medications management;Assistance with feeding;Assist for transportation;Assistance with cooking/housework;Direct supervision/assist for financial management   Equipment Recommendations  None recommended by PT    Recommendations for Other Services       Precautions / Restrictions Precautions Precautions: Fall Restrictions Weight Bearing Restrictions: Yes RLE Weight Bearing: Weight bearing as tolerated LLE Weight Bearing: Weight bearing as  tolerated     Mobility  Bed Mobility Overal bed mobility: Needs Assistance             General bed mobility comments: Performed mobility in recliner to reposition patient and reduce risk of pressure on bottom as she had been sitting in recliner for 1.5 hours.  Family requested patient stay in recliner for aspiration risk.  PTA and nurse repositioned her bottom in recliner and placed pillow under head and L hip for comfort.  Pt resting soudly post session.    Transfers Overall transfer level:  (deferred back to bed per patient's son request.)                      Ambulation/Gait                   Stairs             Wheelchair Mobility    Modified Rankin (Stroke Patients Only)       Balance Overall balance assessment: Needs assistance Sitting-balance support: Bilateral upper extremity supported, Feet supported Sitting balance-Leahy Scale: Poor Sitting balance - Comments: Sitting edge of chair with posterior lean requiring assist to maintain balance                                    Cognition Arousal/Alertness: Awake/alert Behavior During Therapy: WFL for tasks assessed/performed Overall Cognitive Status: Within Functional Limits for tasks assessed                                 General Comments: Pt noted to have some slurred  speech, family reports pt is not enunciating as well as prior to sx, no other focal deficits noted - MD & nurse made aware.        Exercises      General Comments        Pertinent Vitals/Pain Pain Assessment Pain Assessment: Faces Faces Pain Scale: Hurts even more Pain Location: L hip Pain Descriptors / Indicators: Discomfort, Grimacing, Guarding Pain Intervention(s): Monitored during session, Repositioned    Home Living     Available Help at Discharge: Family;Available 24 hours/day Type of Home: House                  Prior Function            PT Goals (current  goals can now be found in the care plan section) Acute Rehab PT Goals Patient Stated Goal: return to PLOF Potential to Achieve Goals: Fair Progress towards PT goals: Progressing toward goals    Frequency    Min 3X/week      PT Plan Current plan remains appropriate    Co-evaluation              AM-PAC PT "6 Clicks" Mobility   Outcome Measure  Help needed turning from your back to your side while in a flat bed without using bedrails?: Total Help needed moving from lying on your back to sitting on the side of a flat bed without using bedrails?: Total Help needed moving to and from a bed to a chair (including a wheelchair)?: Total Help needed standing up from a chair using your arms (e.g., wheelchair or bedside chair)?: Total Help needed to walk in hospital room?: Total Help needed climbing 3-5 steps with a railing? : Total 6 Click Score: 6    End of Session Equipment Utilized During Treatment: Gait belt Activity Tolerance: Patient limited by pain;Patient limited by fatigue;Patient limited by lethargy Patient left: in chair;with call bell/phone within reach;with family/visitor present Nurse Communication: Mobility status PT Visit Diagnosis: Pain;Muscle weakness (generalized) (M62.81);Other abnormalities of gait and mobility (R26.89);Difficulty in walking, not elsewhere classified (R26.2);Unsteadiness on feet (R26.81) Pain - Right/Left: Left Pain - part of body: Hip     Time: 4098-1191 PT Time Calculation (min) (ACUTE ONLY): 18 min  Charges:  $Therapeutic Activity: 8-22 mins                     Bonney Leitz , PTA Acute Rehabilitation Services Office [phone]    Amanda [name] 10/04/2022, 3:04 PM

## 2022-10-04 NOTE — Progress Notes (Signed)
PROGRESS NOTE    Amanda [name]  ZOX:[id number] DOB: December 23, 1918 DOA: 09/29/2022 PCP: Lupita Raider, MD     Brief Narrative:  Amanda [name] is a 89 y.o. female with a history of chronic atrial fibrillation, diabetes mellitus, hypothyroidism. Patient presented after a fall at home and subsequent pain preventing ambulation. Patient was found to have a left hip fracture. Orthopedic surgery consulted for surgical management.  Patient underwent intramedullary nail fixation of left hip by Dr. Roda Shutters 5/15.  New events last 24 hours / Subjective: Patient seen and daughter at bedside.  Patient sleeping soundly.  Per daughter, patient is not a morning person.  No new issues overnight per report.  Assessment & Plan:   Principal Problem:   Displaced intertrochanteric fracture of left femur, initial encounter for closed fracture [facility]) Active Problems:   Hypothyroidism   Permanent atrial fibrillation (HCC)   Diabetes mellitus type 2, controlled (HCC)   HTN (hypertension)   Hip fracture (HCC)   Restless leg syndrome   DNR (do not resuscitate)   CVA (cerebral vascular accident) (HCC)  Left hip fracture secondary to mechanical fall -Orthopedic surgery consulted -S/p intramedullary nail fixation of left hip by Dr. Roda Shutters 5/15 -PT OT looking into CIR placement  Small bilateral acute CVA -MRI brain: Acute lacunar infarct in the bilateral corona radiata  -Appreciate neurology  -Echocardiogram with EF 60 to 65% -MRA head negative for large vessel occlusion -Bilateral carotid duplex consistent with 1 to 39% stenosis -LDL 68 -Continue Xarelto -PT OT SLP  AKI on CKD stage IIIa -Baseline creatinine 1 -Hold lisinopril -Renal ultrasound without hydronephrosis -Improving, continue IV fluid 1 more day.  Continue to watch urine output  Hyperkalemia -Resolved  Chronic A-fib -Xarelto   Diabetes mellitus type 2 -SSI  Hypertension -Lisinopril on hold due to  AKI  Hypothyroidism -Synthroid  RLS -Requip  CKD stage IIIa -Stable   DVT prophylaxis:  SCDs Start: 10/01/22 1829 Place TED hose Start: 10/01/22 1829 SCDs Start: 09/29/22 1516 Rivaroxaban (XARELTO) tablet 15 mg  Code Status: DNR Family Communication: Daughter at bedside Disposition Plan: Consideration for CIR Status is: Inpatient Remains inpatient appropriate because: CIR placement    Antimicrobials:  Anti-infectives (From admission, onward)    Start     Dose/Rate Route Frequency Ordered Stop   10/01/22 2000  ceFAZolin (ANCEF) IVPB 2g/100 mL premix        2 g 200 mL/hr over 30 Minutes Intravenous Every 6 hours 10/01/22 1828 10/03/22 0951        Objective: Vitals:   10/03/22 0804 10/03/22 1935 10/04/22 0435 10/04/22 0738  BP: (!) 120/37 (!) 102/42 (!) 123/91 (!) 99/53  Pulse: 90 93 73 76  Resp: 16  12 16   Temp: 98.9 F (37.2 C) 98 F (36.7 C) 97.7 F (36.5 C) 99.8 F (37.7 C)  TempSrc: Oral Oral Oral Oral  SpO2: 100% 93% 92% 94%  Weight:      Height:        Intake/Output Summary (Last 24 hours) at 10/04/2022 1049 Last data filed at 10/03/2022 1700 Gross per 24 hour  Intake 850 ml  Output --  Net 850 ml    Filed Weights   09/29/22 1127  Weight: 68.9 kg    Examination:  General exam: Appears calm and comfortable  Respiratory system: Clear to auscultation. Respiratory effort normal. No respiratory distress.  Cardiovascular system: S1 & S2 heard, irreg rhythm  Gastrointestinal system: Abdomen is nondistended, soft  Central nervous system: Deferred today Extremities: Symmetric  in appearance  Skin: No rashes, lesions or ulcers on exposed skin   Data Reviewed: I have personally reviewed following labs and imaging studies  CBC: Recent Labs  Lab 09/29/22 1135 10/01/22 0625 10/02/22 0128 10/03/22 0851 10/04/22 0241  WBC 9.2 14.8* 13.5* 11.3* 12.1*  NEUTROABS 7.0  --   --   --   --   HGB 11.2* 8.2* 8.9* 7.5* 7.4*  HCT 35.7* 25.2* 27.1* 24.0*  23.2*  MCV 103.2* 98.1 96.1 101.7* 100.4*  PLT 190 129* 127* 120* 134*    Basic Metabolic Panel: Recent Labs  Lab 10/01/22 0625 10/02/22 0128 10/02/22 0812 10/02/22 1317 10/03/22 0851 10/04/22 0241  NA 129* 129* 130*  --  131* 132*  K 4.8 6.0* 5.5* 4.7 4.9 4.9  CL 98 97* 97*  --  102 103  CO2 23 21* 23  --  22 20*  GLUCOSE 144* 255* 231*  --  183* 172*  BUN 30* 38* 40*  --  45* 47*  CREATININE 1.05* 1.30* 1.40*  --  1.39* 1.28*  CALCIUM 8.3* 8.0* 8.1*  --  7.7* 7.9*    GFR: Estimated Creatinine Clearance: 20.7 mL/min (A) (by C-G formula based on SCr of 1.28 mg/dL (H)). Liver Function Tests: No results for input(s): "AST", "ALT", "ALKPHOS", "BILITOT", "PROT", "ALBUMIN" in the last 168 hours. No results for input(s): "LIPASE", "AMYLASE" in the last 168 hours. No results for input(s): "AMMONIA" in the last 168 hours. Coagulation Profile: Recent Labs  Lab 09/29/22 1135  INR 1.4*    Cardiac Enzymes: No results for input(s): "CKTOTAL", "CKMB", "CKMBINDEX", "TROPONINI" in the last 168 hours. BNP (last 3 results) No results for input(s): "PROBNP" in the last 8760 hours. HbA1C: No results for input(s): "HGBA1C" in the last 72 hours. CBG: Recent Labs  Lab 10/03/22 0805 10/03/22 1210 10/03/22 1705 10/03/22 2020 10/04/22 0827  GLUCAP 161* 206* 168* 168* 161*    Lipid Profile: Recent Labs    10/04/22 0241  CHOL 134  HDL 48  LDLCALC 68  TRIG 91  CHOLHDL 2.8   Thyroid Function Tests: No results for input(s): "TSH", "T4TOTAL", "FREET4", "T3FREE", "THYROIDAB" in the last 72 hours. Anemia Panel: No results for input(s): "VITAMINB12", "FOLATE", "FERRITIN", "TIBC", "IRON", "RETICCTPCT" in the last 72 hours. Sepsis Labs: No results for input(s): "PROCALCITON", "LATICACIDVEN" in the last 168 hours.  Recent Results (from the past 240 hour(s))  Surgical pcr screen     Status: None   Collection Time: 09/30/22  5:40 PM   Specimen: Nasal Mucosa; Nasal Swab  Result  Value Ref Range Status   MRSA, PCR NEGATIVE NEGATIVE Final   Staphylococcus aureus NEGATIVE NEGATIVE Final    Comment: (NOTE) The Xpert SA Assay (FDA approved for NASAL specimens in patients 26 years of age and older), is one component of a comprehensive surveillance program. It is not intended to diagnose infection nor to guide or monitor treatment. Performed at [facility], [address], [place], [place] [address]       Radiology Studies: VAS US CAROTID  Result Date: 10/04/2022 Carotid Arterial Duplex Study Patient Name:  Amanda [name]  Date of Exam:   10/03/2022 Medical Rec #: [id number]           Accession #:    [id number] Date of Birth: 10-01-1918           Patient Gender: F Patient Age:   89 years [facility] Procedure:  VAS US CAROTID Referring Phys: Angelique Blonder WOLFE --------------------------------------------------------------------------------  Indications:       CVA. Risk Factors:      Hypertension, Diabetes. Comparison Study:  01/01/2019 - Right Carotid: The extracranial vessels were                    near-normal with only minimal                    wall                    thickening or plaque.                     Left Carotid: The extracranial vessels were near-normal with                    only minimal                    wall                    thickening or plaque.                     Vertebrals: Bilateral vertebral arteries demonstrate                    antegrade flow.                    Subclavians: Normal flow hemodynamics were seen in bilateral                    subclavian                    arteries. Performing Technologist: Chanda Busing RVT  Examination Guidelines: A complete evaluation includes B-mode imaging, spectral Doppler, color Doppler, and power Doppler as needed of all accessible portions of each vessel. Bilateral testing is considered an integral part of a complete examination. Limited examinations for reoccurring indications  may be performed as noted.  Right Carotid Findings: +----------+--------+--------+--------+-----------------------+--------+           PSV cm/sEDV cm/sStenosisPlaque Description     Comments +----------+--------+--------+--------+-----------------------+--------+ CCA Prox  96      10              smooth and heterogenous         +----------+--------+--------+--------+-----------------------+--------+ CCA Distal96      13              smooth and heterogenous         +----------+--------+--------+--------+-----------------------+--------+ ICA Prox  69      17              smooth and heterogenous         +----------+--------+--------+--------+-----------------------+--------+ ICA Mid   103     27              smooth and heterogenous         +----------+--------+--------+--------+-----------------------+--------+ ICA Distal104     21                                     tortuous +----------+--------+--------+--------+-----------------------+--------+ ECA       104     0                                               +----------+--------+--------+--------+-----------------------+--------+ +----------+--------+-------+--------+-------------------+  PSV cm/sEDV cmsDescribeArm Pressure (mmHG) +----------+--------+-------+--------+-------------------+ [id number]                                        +----------+--------+-------+--------+-------------------+ +---------+--------+--+--------+--+---------+ VertebralPSV cm/s57EDV cm/s12Antegrade +---------+--------+--+--------+--+---------+  Left Carotid Findings: +----------+--------+--------+--------+-----------------------+--------+           PSV cm/sEDV cm/sStenosisPlaque Description     Comments +----------+--------+--------+--------+-----------------------+--------+ CCA Prox  140     13              smooth and heterogenous          +----------+--------+--------+--------+-----------------------+--------+ CCA Distal100     13              smooth and heterogenous         +----------+--------+--------+--------+-----------------------+--------+ ICA Prox  93      20                                              +----------+--------+--------+--------+-----------------------+--------+ ICA Mid   91      23              smooth and heterogenous         +----------+--------+--------+--------+-----------------------+--------+ ICA Distal125     29                                     tortuous +----------+--------+--------+--------+-----------------------+--------+ ECA       101     0                                               +----------+--------+--------+--------+-----------------------+--------+ +----------+--------+--------+--------+-------------------+           PSV cm/sEDV cm/sDescribeArm Pressure (mmHG) +----------+--------+--------+--------+-------------------+ [id number]                                         +----------+--------+--------+--------+-------------------+ +---------+--------+--+--------+--+---------+ VertebralPSV cm/s56EDV cm/s13Antegrade +---------+--------+--+--------+--+---------+   Summary: Right Carotid: Velocities in the right ICA are consistent with a 1-39% stenosis. Left Carotid: Velocities in the left ICA are consistent with a 1-39% stenosis. Vertebrals: Bilateral vertebral arteries demonstrate antegrade flow. *See table(s) above for measurements and observations.  Electronically signed by Gerarda Fraction on 10/04/2022 at 8:51:06 AM.    Final    MR ANGIO HEAD WO CONTRAST  Result Date: 10/04/2022 CLINICAL DATA:  Follow-up examination for stroke. EXAM: MRA HEAD WITHOUT CONTRAST TECHNIQUE: Angiographic images of the Circle of Willis were acquired using MRA technique without intravenous contrast. COMPARISON:  Prior MRI from 10/02/2022. FINDINGS: Anterior circulation:  Examination moderately to severely degraded by motion artifact. Both internal carotid arteries are patent through the siphons without visible stenosis or other abnormality. A1 segments patent. Grossly normal anterior communicating complex. Anterior cerebral arteries patent without visible stenosis. No M1 stenosis or occlusion. No visible proximal MCA branch occlusion or high-grade stenosis. Distal MCA branches grossly perfused and symmetric. Posterior circulation: Visualized V4 segments patent without stenosis. Left PICA grossly patent at its origin. Right PICA origin not well seen on this motion degraded exam. Basilar patent  without stenosis. Superior cerebral arteries patent bilaterally. Both PCAs primarily supplied via the basilar. Suspected atheromatous irregularity within both PCAs which remain patent to their distal aspects. Evaluation for potential stenosis fairly limited by motion. Anatomic variants: None significant. Other: No visible aneurysm. IMPRESSION: 1. Technically limited exam due to extensive motion artifact. 2. Negative intracranial MRA for large vessel occlusion. Please note that evaluation for potential stenoses is fairly limited given motion artifact. Electronically Signed   By: Rise Mu M.D.   On: 10/04/2022 04:10   ECHOCARDIOGRAM COMPLETE  Result Date: 10/03/2022    ECHOCARDIOGRAM REPORT   Patient Name:   Amanda [name] Date of Exam: 10/03/2022 Medical Rec #:  [id number]          Height:       63.0 in Accession #:    [id number]         Weight:       151.9 lb Date of Birth:  June 27, 1918          BSA:          1.720 m Patient Age:    89 years          BP:           120/37 mmHg Patient Gender: F                  HR:           91 bpm. Exam Location:  Inpatient Procedure: 2D Echo, Cardiac Doppler and Color Doppler Indications:    stroke  History:        Patient has prior history of Echocardiogram examinations, most                 recent 03/18/2022. Arrythmias:Atrial Fibrillation;  Risk                 Factors:Diabetes and Hypertension.  Sonographer:    Delcie Roch RDCS Referring Phys: [id number] Afrika Brick IMPRESSIONS  1. Left ventricular ejection fraction, by estimation, is 60 to 65%. The left ventricle has normal function. The left ventricle has no regional wall motion abnormalities. There is mild concentric left ventricular hypertrophy. Left ventricular diastolic function could not be evaluated.  2. Right ventricular systolic function is mildly reduced. The right ventricular size is normal. There is severely elevated pulmonary artery systolic pressure.  3. Left atrial size was moderately dilated.  4. Right atrial size was severely dilated.  5. The mitral valve is degenerative. Trivial mitral valve regurgitation. No evidence of mitral stenosis.  6. Tricuspid valve regurgitation is severe.  7. The aortic valve is tricuspid. Aortic valve regurgitation is not visualized. No aortic stenosis is present.  8. The inferior vena cava is normal in size with greater than 50% respiratory variability, suggesting right atrial pressure of 3 mmHg. FINDINGS  Left Ventricle: Left ventricular ejection fraction, by estimation, is 60 to 65%. The left ventricle has normal function. The left ventricle has no regional wall motion abnormalities. The left ventricular internal cavity size was normal in size. There is  mild concentric left ventricular hypertrophy. Left ventricular diastolic function could not be evaluated due to atrial fibrillation. Left ventricular diastolic function could not be evaluated. Right Ventricle: The right ventricular size is normal. No increase in right ventricular wall thickness. Right ventricular systolic function is mildly reduced. There is severely elevated pulmonary artery systolic pressure. The tricuspid regurgitant velocity is 3.98 m/s, and with an assumed right atrial pressure of 5 mmHg, the estimated right ventricular  systolic pressure is 68.4 mmHg. Left Atrium: Left atrial  size was moderately dilated. Right Atrium: Right atrial size was severely dilated. Pericardium: Trivial pericardial effusion is present. The pericardial effusion is posterior to the left ventricle. Mitral Valve: The mitral valve is degenerative in appearance. Trivial mitral valve regurgitation. No evidence of mitral valve stenosis. Tricuspid Valve: The tricuspid valve is normal in structure. Tricuspid valve regurgitation is severe. No evidence of tricuspid stenosis. Aortic Valve: The aortic valve is tricuspid. Aortic valve regurgitation is not visualized. No aortic stenosis is present. Pulmonic Valve: The pulmonic valve was normal in structure. Pulmonic valve regurgitation is trivial. No evidence of pulmonic stenosis. Aorta: The aortic root is normal in size and structure. Venous: The inferior vena cava is normal in size with greater than 50% respiratory variability, suggesting right atrial pressure of 3 mmHg. IAS/Shunts: No atrial level shunt detected by color flow Doppler.  LEFT VENTRICLE PLAX 2D LVIDd:         3.80 cm LVIDs:         2.70 cm LV PW:         1.30 cm LV IVS:        1.00 cm LVOT diam:     1.80 cm LV SV:         40 LV SV Index:   23 LVOT Area:     2.54 cm  RIGHT VENTRICLE          IVC RV Basal diam:  2.90 cm  IVC diam: 1.70 cm TAPSE (M-mode): 0.9 cm LEFT ATRIUM             Index        RIGHT ATRIUM           Index LA diam:        3.90 cm 2.27 cm/m   RA Area:     23.90 cm LA Vol (A2C):   51.8 ml 30.11 ml/m  RA Volume:   80.20 ml  46.62 ml/m LA Vol (A4C):   60.7 ml 35.29 ml/m LA Biplane Vol: 59.2 ml 34.41 ml/m  AORTIC VALVE LVOT Vmax:   109.00 cm/s LVOT Vmean:  67.700 cm/s LVOT VTI:    0.158 m  AORTA Ao Root diam: 2.90 cm Ao Asc diam:  2.90 cm TRICUSPID VALVE TR Peak grad:   63.4 mmHg TR Vmax:        398.00 cm/s  SHUNTS Systemic VTI:  0.16 m Systemic Diam: 1.80 cm Arvilla Meres MD Electronically signed by Arvilla Meres MD Signature Date/Time: 10/03/2022/6:07:58 PM    Final    US  RENAL  Result Date: 10/03/2022 CLINICAL DATA:  [id number] AKI (acute kidney injury) (HCC) [id number] EXAM: RENAL / URINARY TRACT ULTRASOUND COMPLETE COMPARISON:  06/13/2009 FINDINGS: Right Kidney: Renal measurements: 8.3 x 3.8 x 4.6 cm = volume: 76.5 mL. No hydronephrosis. Increased renal cortical echogenicity. Left Kidney: Renal measurements: 9.7 x 3.9 x 4.5 cm = volume: 89.0 mL. No hydronephrosis. Increased renal cortical echogenicity. Bladder: Not visualized, presumably decompressed Other: None. IMPRESSION: No hydronephrosis. Increased renal cortical echogenicity bilaterally, as can be seen in medical renal disease. Electronically Signed   By: Caprice Renshaw M.D.   On: 10/03/2022 12:57   DG Swallowing Func-Speech Pathology  Result Date: 10/03/2022 Table formatting from the original result was not included. Modified Barium Swallow Study Patient Details Name: Amanda [name] MRN: [id number] Date of Birth: 04-28-1919 Today's Date: 10/03/2022 HPI/PMH: HPI: Pt is a 89 year old female admitted after a fall and  subsequent pain preventing ambulation. Patient was found to have a left hip fracture. Underwnt surgery on 5/15. Reported feeling of food and liquid in throat. Family reports this has been ongoing for a while. She is afraid of choking. Found to ahve had acute CVA perioperatively. MRI shows Acute lacunar infarct in the bilateral corona radiata. Clinical Impression: Pt participatory, but very hard of hearing and a little lethargic this am. Pt able to follow simple commands, but had difficulty staying attentive to follow swallow strategies or comprehend fully though cognition typically WNL. Pt demonstrates a mild oropharyngeal dysphagia with mild anterior spillage due to left facial nerve weakness and mild oral residue likely due to decreased lingual tension.  There were instance of dleayed swallow initaition and penetration before the swallow that was ejected with good vestbublar closure. Trace aspiration after the  swallow did occur due to moderate resiude in the pyriform sinuses. Pt was able to sense and clear with a cough and cleared oral and pharyngeal residue with a second swallow. Pts severe and prolonged coughing however was mostly due to stasis of puree in the proximal esophagus. Pt repeatedly coughing and hacking well after airway clear due to primary esophageal dysphagia. Liquid wash very helpful.  Did not attempt mastication of solids due to xerostomia and no dentition at the time of exam as well as severe esophageal retention and prolonged coughing and globus as a result. Typically pt attempts a mechanical soft texture at home and goes out to eat often with family. Given increased risk of aspiration due to mobility issues after hip fx and pain medication in setting of chronic esophageal dysphagia and mild oropharyngeal dysphagia, will maintain a restricitve diet of puree and nectar thick liquids with aspiration precautions and reflux precautions. Hopeful for upgrade during rehabilitation process. Factors that may increase risk of adverse event in presence of aspiration Rubye Oaks & Clearance Coots 2021): Factors that may increase risk of adverse event in presence of aspiration Rubye Oaks & Clearance Coots 2021): Limited mobility; Aspiration of thick, dense, and/or acidic materials Recommendations/Plan: Swallowing Evaluation Recommendations Swallowing Evaluation Recommendations Recommendations: PO diet PO Diet Recommendation: Dysphagia 1 (Pureed); Mildly thick liquids (Level 2, nectar thick) Liquid Administration via: Cup; Straw Medication Administration: Crushed with puree Supervision: Full supervision/cueing for swallowing strategies Swallowing strategies  : Slow rate; Small bites/sips; Follow solids with liquids; Minimize environmental distractions Postural changes: Position pt fully upright for meals; Stay upright 30-60 min after meals Oral care recommendations: Oral care BID (2x/day) Treatment Plan Treatment Plan Treatment  recommendations: Therapy as outlined in treatment plan below Follow-up recommendations: Acute inpatient rehab (3 hours/day) Functional status assessment: Patient has had a recent decline in their functional status and demonstrates the ability to make significant improvements in function in a reasonable and predictable amount of time. Treatment frequency: Min 2x/week Treatment duration: 2 weeks Interventions: Aspiration precaution training; Compensatory techniques; Patient/family education; Trials of upgraded texture/liquids; Diet toleration management by SLP Recommendations Recommendations for follow up therapy are one component of a multi-disciplinary discharge planning process, led by the attending physician.  Recommendations may be updated based on patient status, additional functional criteria and insurance authorization. Assessment: Orofacial Exam: Orofacial Exam Oral Cavity: Oral Hygiene: Xerostomia Oral Cavity - Dentition: Edentulous Anatomy: No data recorded Boluses Administered: Boluses Administered Boluses Administered: Mildly thick liquids (Level 2, nectar thick); Thin liquids (Level 0); Puree; Solid  Oral Impairment Domain: Oral Impairment Domain Lip Closure: Escape beyond mid-chin Tongue control during bolus hold: Escape to lateral buccal cavity/floor of mouth Bolus preparation/mastication: -- (  solids NT) Oral residue: Residue collection on oral structures Location of oral residue : Tongue; Palate Initiation of pharyngeal swallow : Pyriform sinuses  Pharyngeal Impairment Domain: Pharyngeal Impairment Domain Soft palate elevation: No bolus between soft palate (SP)/pharyngeal wall (PW) Laryngeal elevation: Complete superior movement of thyroid cartilage with complete approximation of arytenoids to epiglottic petiole Anterior hyoid excursion: Complete anterior movement Epiglottic movement: Complete inversion Laryngeal vestibule closure: Complete, no air/contrast in laryngeal vestibule Pharyngeal stripping  wave : Present - complete Pharyngoesophageal segment opening: Complete distension and complete duration, no obstruction of flow Tongue base retraction: Trace column of contrast or air between tongue base and PPW Pharyngeal residue: Collection of residue within or on pharyngeal structures Location of pharyngeal residue: Valleculae; Pyriform sinuses  Esophageal Impairment Domain: Esophageal Impairment Domain Esophageal clearance upright position: Esophageal retention with retrograde flow below pharyngoesophageal segment (PES); Esophageal retention Pill: Esophageal Impairment Domain Esophageal clearance upright position: Esophageal retention with retrograde flow below pharyngoesophageal segment (PES); Esophageal retention Penetration/Aspiration Scale Score: No data recorded Compensatory Strategies: Compensatory Strategies Compensatory strategies: No   General Information: Caregiver present: No  Diet Prior to this Study: Dysphagia 1 (pureed); Mildly thick liquids (Level 2, nectar thick)   Temperature : Normal   No data recorded  Supplemental O2: None (Room air)   History of Recent Intubation: Yes  Behavior/Cognition: Alert; Cooperative; Pleasant mood (hard of hearing) Self-Feeding Abilities: Able to self-feed Baseline vocal quality/speech: Normal Volitional Cough: Able to elicit Volitional Swallow: Able to elicit No data recorded Goal Planning: Prognosis for improved oropharyngeal function: Good No data recorded No data recorded Patient/Family Stated Goal: rehab No data recorded Pain: Pain Assessment Pain Assessment: 0-10 Pain Score: 10 Pain Location: L hip Pain Descriptors / Indicators: Discomfort; Grimacing; Guarding Pain Intervention(s): Monitored during session; Limited activity within patient's tolerance; Repositioned End of Session: Start Time:SLP Start Time (ACUTE ONLY): 8119 Stop Time: SLP Stop Time (ACUTE ONLY): 0940 Time Calculation:SLP Time Calculation (min) (ACUTE ONLY): 15 min Charges: SLP Evaluations $ SLP  Speech Visit: 1 Visit SLP Evaluations $BSS Swallow: 1 Procedure $MBS Swallow: 1 Procedure $Swallowing Treatment: 1 Procedure SLP visit diagnosis: SLP Visit Diagnosis: Dysphagia, oropharyngeal phase (R13.12) Past Medical History: Past Medical History: Diagnosis Date  Carpal tunnel syndrome   Chronic anticoagulation   Chronic atrial fibrillation (HCC)   Diabetes mellitus   NON-INSULIN  Hypothyroidism   Sinus congestion  Past Surgical History: Past Surgical History: Procedure Laterality Date  APPENDECTOMY    CARDIOVASCULAR STRESS TEST  07/10/2004  EF 81%  CHOLECYSTECTOMY    INTRAMEDULLARY (IM) NAIL INTERTROCHANTERIC Left 10/01/2022  Procedure: INTRAMEDULLARY (IM) NAIL INTERTROCHANTERIC;  Surgeon: Tarry Kos, MD;  Location: [facility] OR;  Service: Orthopedics;  Laterality: Left;  US ECHOCARDIOGRAPHY  12/06/2007  EF 55-60% DeBlois, Riley Nearing 10/03/2022, 12:26 PM  MR BRAIN WO CONTRAST  Result Date: 10/02/2022 CLINICAL DATA:  Neuro deficit with acute stroke suspected EXAM: MRI HEAD WITHOUT CONTRAST TECHNIQUE: Multiplanar, multiecho pulse sequences of the brain and surrounding structures were obtained without intravenous contrast. COMPARISON:  Head CT from 3 days ago FINDINGS: Brain: Small acute infarcts in the bilateral corona radiata. Although that bilateral and somewhat symmetric in size and location these are not at the basal ganglia to imply a global/toxic cause. There is chronic small vessel ischemia in the cerebral white matter to a mild degree for age. No acute hemorrhage, hydrocephalus, or collection. Cerebral volume loss that is mild for age. Few chronic microhemorrhages mainly in the deep brain , likely microvascular ischemic. Vascular: Normal  flow voids. Skull and upper cervical spine: Normal marrow signal. Sinuses/Orbits: Negative. IMPRESSION: 1. Acute lacunar infarct in the bilateral corona radiata. 2. Generalized atrophy and chronic small vessel ischemia. Electronically Signed   By: Tiburcio Pea M.D.    On: 10/02/2022 19:27      Scheduled Meds:   stroke: early stages of recovery book   Does not apply Once   Chlorhexidine Gluconate Cloth  6 each Topical Daily   docusate  100 mg Oral BID   insulin aspart  0-9 Units Subcutaneous TID WC   levothyroxine  88 mcg Oral Q0600   loratadine  10 mg Oral Daily   rivaroxaban  15 mg Oral Q supper   rOPINIRole  0.25 mg Oral QHS   Continuous Infusions:  sodium chloride 125 mL/hr at 10/03/22 2315   methocarbamol (ROBAXIN) IV     promethazine (PHENERGAN) injection (IM or IVPB) 12.5 mg (10/02/22 2130)     LOS: 5 days   Time spent: 25 minutes   Noralee Stain, DO [facility] 10/04/2022, 10:49 AM   Available via Epic secure chat 7am-7pm After these hours, please refer to coverage provider listed on [email or web address]

## 2022-10-04 NOTE — Progress Notes (Signed)
STROKE TEAM PROGRESS NOTE   INTERVAL HISTORY Family at bedside.  She is doing well.  No new complaints.  Stroke workup complete.  LDL is elevated.  Initially there concerned about starting statin but then agreed to low-dose.  Discussed imaging workup with them.  Answered all questions.  Vitals:   10/03/22 1935 10/04/22 0435 10/04/22 0738 10/04/22 1400  BP: (!) 102/42 (!) 123/91 (!) 99/53 (!) 106/44  Pulse: 93 73 76   Resp:  12 16 19   Temp: 98 F (36.7 C) 97.7 F (36.5 C) 99.8 F (37.7 C)   TempSrc: Oral Oral Oral   SpO2: 93% 92% 94%   Weight:      Height:       CBC:  Recent Labs  Lab 09/29/22 1135 10/01/22 0625 10/03/22 0851 10/04/22 0241  WBC 9.2   < > 11.3* 12.1*  NEUTROABS 7.0  --   --   --   HGB 11.2*   < > 7.5* 7.4*  HCT 35.7*   < > 24.0* 23.2*  MCV 103.2*   < > 101.7* 100.4*  PLT 190   < > 120* 134*   < > = values in this interval not displayed.    Basic Metabolic Panel:  Recent Labs  Lab 10/03/22 0851 10/04/22 0241  NA 131* 132*  K 4.9 4.9  CL 102 103  CO2 22 20*  GLUCOSE 183* 172*  BUN 45* 47*  CREATININE 1.39* 1.28*  CALCIUM 7.7* 7.9*    Lipid Panel:  Recent Labs  Lab 10/04/22 0241  CHOL 134  TRIG 91  HDL 48  CHOLHDL 2.8  VLDL 18  LDLCALC 68   HgbA1c: No results for input(s): "HGBA1C" in the last 168 hours. Urine Drug Screen: No results for input(s): "LABOPIA", "COCAINSCRNUR", "LABBENZ", "AMPHETMU", "THCU", "LABBARB" in the last 168 hours.  Alcohol Level No results for input(s): "ETH" in the last 168 hours.  IMAGING past 24 hours VAS US CAROTID  Result Date: 10/04/2022 Carotid Arterial Duplex Study Patient Name:  Amanda [name]  Date of Exam:   10/03/2022 Medical Rec #: [id number]           Accession #:    [id number] Date of Birth: 10-Feb-1919           Patient Gender: F Patient Age:   89 years Exam Location:  [facility] Procedure:      VAS US CAROTID Referring Phys: Gevena Mart  --------------------------------------------------------------------------------  Indications:       CVA. Risk Factors:      Hypertension, Diabetes. Comparison Study:  01/01/2019 - Right Carotid: The extracranial vessels were                    near-normal with only minimal                    wall                    thickening or plaque.                     Left Carotid: The extracranial vessels were near-normal with                    only minimal                    wall  thickening or plaque.                     Vertebrals: Bilateral vertebral arteries demonstrate                    antegrade flow.                    Subclavians: Normal flow hemodynamics were seen in bilateral                    subclavian                    arteries. Performing Technologist: Chanda Busing RVT  Examination Guidelines: A complete evaluation includes B-mode imaging, spectral Doppler, color Doppler, and power Doppler as needed of all accessible portions of each vessel. Bilateral testing is considered an integral part of a complete examination. Limited examinations for reoccurring indications may be performed as noted.  Right Carotid Findings: +----------+--------+--------+--------+-----------------------+--------+           PSV cm/sEDV cm/sStenosisPlaque Description     Comments +----------+--------+--------+--------+-----------------------+--------+ CCA Prox  96      10              smooth and heterogenous         +----------+--------+--------+--------+-----------------------+--------+ CCA Distal96      13              smooth and heterogenous         +----------+--------+--------+--------+-----------------------+--------+ ICA Prox  69      17              smooth and heterogenous         +----------+--------+--------+--------+-----------------------+--------+ ICA Mid   103     27              smooth and heterogenous          +----------+--------+--------+--------+-----------------------+--------+ ICA Distal104     21                                     tortuous +----------+--------+--------+--------+-----------------------+--------+ ECA       104     0                                               +----------+--------+--------+--------+-----------------------+--------+ +----------+--------+-------+--------+-------------------+           PSV cm/sEDV cmsDescribeArm Pressure (mmHG) +----------+--------+-------+--------+-------------------+ [id number]                                        +----------+--------+-------+--------+-------------------+ +---------+--------+--+--------+--+---------+ VertebralPSV cm/s57EDV cm/s12Antegrade +---------+--------+--+--------+--+---------+  Left Carotid Findings: +----------+--------+--------+--------+-----------------------+--------+           PSV cm/sEDV cm/sStenosisPlaque Description     Comments +----------+--------+--------+--------+-----------------------+--------+ CCA Prox  140     13              smooth and heterogenous         +----------+--------+--------+--------+-----------------------+--------+ CCA Distal100     13              smooth and heterogenous         +----------+--------+--------+--------+-----------------------+--------+ ICA Prox  93      20                                              +----------+--------+--------+--------+-----------------------+--------+  ICA Mid   91      23              smooth and heterogenous         +----------+--------+--------+--------+-----------------------+--------+ ICA Distal125     29                                     tortuous +----------+--------+--------+--------+-----------------------+--------+ ECA       101     0                                               +----------+--------+--------+--------+-----------------------+--------+  +----------+--------+--------+--------+-------------------+           PSV cm/sEDV cm/sDescribeArm Pressure (mmHG) +----------+--------+--------+--------+-------------------+ Subclavian151                                         +----------+--------+--------+--------+-------------------+ +---------+--------+--+--------+--+---------+ VertebralPSV cm/s56EDV cm/s13Antegrade +---------+--------+--+--------+--+---------+   Summary: Right Carotid: Velocities in the right ICA are consistent with a 1-39% stenosis. Left Carotid: Velocities in the left ICA are consistent with a 1-39% stenosis. Vertebrals: Bilateral vertebral arteries demonstrate antegrade flow. *See table(s) above for measurements and observations.  Electronically signed by Gerarda Fraction on 10/04/2022 at 8:51:06 AM.    Final    MR ANGIO HEAD WO CONTRAST  Result Date: 10/04/2022 CLINICAL DATA:  Follow-up examination for stroke. EXAM: MRA HEAD WITHOUT CONTRAST TECHNIQUE: Angiographic images of the Circle of Willis were acquired using MRA technique without intravenous contrast. COMPARISON:  Prior MRI from 10/02/2022. FINDINGS: Anterior circulation: Examination moderately to severely degraded by motion artifact. Both internal carotid arteries are patent through the siphons without visible stenosis or other abnormality. A1 segments patent. Grossly normal anterior communicating complex. Anterior cerebral arteries patent without visible stenosis. No M1 stenosis or occlusion. No visible proximal MCA branch occlusion or high-grade stenosis. Distal MCA branches grossly perfused and symmetric. Posterior circulation: Visualized V4 segments patent without stenosis. Left PICA grossly patent at its origin. Right PICA origin not well seen on this motion degraded exam. Basilar patent without stenosis. Superior cerebral arteries patent bilaterally. Both PCAs primarily supplied via the basilar. Suspected atheromatous irregularity within both PCAs which  remain patent to their distal aspects. Evaluation for potential stenosis fairly limited by motion. Anatomic variants: None significant. Other: No visible aneurysm. IMPRESSION: 1. Technically limited exam due to extensive motion artifact. 2. Negative intracranial MRA for large vessel occlusion. Please note that evaluation for potential stenoses is fairly limited given motion artifact. Electronically Signed   By: Rise Mu M.D.   On: 10/04/2022 04:10   ECHOCARDIOGRAM COMPLETE  Result Date: 10/03/2022    ECHOCARDIOGRAM REPORT   Patient Name:   Amanda [name] Date of Exam: 10/03/2022 Medical Rec #:  [id number]          Height:       63.0 in Accession #:    [id number]         Weight:       151.9 lb Date of Birth:  09/26/1918          BSA:          1.720 m Patient Age:    89 years  BP:           120/37 mmHg Patient Gender: F                  HR:           91 bpm. Exam Location:  Inpatient Procedure: 2D Echo, Cardiac Doppler and Color Doppler Indications:    stroke  History:        Patient has prior history of Echocardiogram examinations, most                 recent 03/18/2022. Arrythmias:Atrial Fibrillation; Risk                 Factors:Diabetes and Hypertension.  Sonographer:    Delcie Roch RDCS Referring Phys: [id number] JENNIFER CHOI IMPRESSIONS  1. Left ventricular ejection fraction, by estimation, is 60 to 65%. The left ventricle has normal function. The left ventricle has no regional wall motion abnormalities. There is mild concentric left ventricular hypertrophy. Left ventricular diastolic function could not be evaluated.  2. Right ventricular systolic function is mildly reduced. The right ventricular size is normal. There is severely elevated pulmonary artery systolic pressure.  3. Left atrial size was moderately dilated.  4. Right atrial size was severely dilated.  5. The mitral valve is degenerative. Trivial mitral valve regurgitation. No evidence of mitral stenosis.  6. Tricuspid  valve regurgitation is severe.  7. The aortic valve is tricuspid. Aortic valve regurgitation is not visualized. No aortic stenosis is present.  8. The inferior vena cava is normal in size with greater than 50% respiratory variability, suggesting right atrial pressure of 3 mmHg. FINDINGS  Left Ventricle: Left ventricular ejection fraction, by estimation, is 60 to 65%. The left ventricle has normal function. The left ventricle has no regional wall motion abnormalities. The left ventricular internal cavity size was normal in size. There is  mild concentric left ventricular hypertrophy. Left ventricular diastolic function could not be evaluated due to atrial fibrillation. Left ventricular diastolic function could not be evaluated. Right Ventricle: The right ventricular size is normal. No increase in right ventricular wall thickness. Right ventricular systolic function is mildly reduced. There is severely elevated pulmonary artery systolic pressure. The tricuspid regurgitant velocity is 3.98 m/s, and with an assumed right atrial pressure of 5 mmHg, the estimated right ventricular systolic pressure is 68.4 mmHg. Left Atrium: Left atrial size was moderately dilated. Right Atrium: Right atrial size was severely dilated. Pericardium: Trivial pericardial effusion is present. The pericardial effusion is posterior to the left ventricle. Mitral Valve: The mitral valve is degenerative in appearance. Trivial mitral valve regurgitation. No evidence of mitral valve stenosis. Tricuspid Valve: The tricuspid valve is normal in structure. Tricuspid valve regurgitation is severe. No evidence of tricuspid stenosis. Aortic Valve: The aortic valve is tricuspid. Aortic valve regurgitation is not visualized. No aortic stenosis is present. Pulmonic Valve: The pulmonic valve was normal in structure. Pulmonic valve regurgitation is trivial. No evidence of pulmonic stenosis. Aorta: The aortic root is normal in size and structure. Venous: The  inferior vena cava is normal in size with greater than 50% respiratory variability, suggesting right atrial pressure of 3 mmHg. IAS/Shunts: No atrial level shunt detected by color flow Doppler.  LEFT VENTRICLE PLAX 2D LVIDd:         3.80 cm LVIDs:         2.70 cm LV PW:         1.30 cm LV IVS:  1.00 cm LVOT diam:     1.80 cm LV SV:         40 LV SV Index:   23 LVOT Area:     2.54 cm  RIGHT VENTRICLE          IVC RV Basal diam:  2.90 cm  IVC diam: 1.70 cm TAPSE (M-mode): 0.9 cm LEFT ATRIUM             Index        RIGHT ATRIUM           Index LA diam:        3.90 cm 2.27 cm/m   RA Area:     23.90 cm LA Vol (A2C):   51.8 ml 30.11 ml/m  RA Volume:   80.20 ml  46.62 ml/m LA Vol (A4C):   60.7 ml 35.29 ml/m LA Biplane Vol: 59.2 ml 34.41 ml/m  AORTIC VALVE LVOT Vmax:   109.00 cm/s LVOT Vmean:  67.700 cm/s LVOT VTI:    0.158 m  AORTA Ao Root diam: 2.90 cm Ao Asc diam:  2.90 cm TRICUSPID VALVE TR Peak grad:   63.4 mmHg TR Vmax:        398.00 cm/s  SHUNTS Systemic VTI:  0.16 m Systemic Diam: 1.80 cm Arvilla Meres MD Electronically signed by Arvilla Meres MD Signature Date/Time: 10/03/2022/6:07:58 PM    Final     PHYSICAL EXAM  Temp:  [97.7 F (36.5 C)-99.8 F (37.7 C)] 99.8 F (37.7 C) (05/18 0738) Pulse Rate:  [73-93] 76 (05/18 0738) Resp:  [12-19] 19 (05/18 1400) BP: (99-123)/(42-91) 106/44 (05/18 1400) SpO2:  [92 %-94 %] 94 % (05/18 0738)  General -elderly female in no apparent distress Cardiovascular -irregularly irregular  Mental Status -  Level of arousal and orientation to time, place, and person were intact.  Follows commands.  No aphasia, dysarthric Language including expression, naming, repetition, comprehension was assessed and found intact.  Cranial Nerves II - XII - II -left lower quadrant field deficit  III, IV, VI - Extraocular movements intact. V - Facial sensation intact bilaterally. VII - left facial VIII - Hearing & vestibular intact bilaterally. X - Palate  elevates symmetrically. XI - Chin turning & shoulder shrug intact bilaterally. XII - Tongue protrusion intact.  Motor Strength - bilateral uppers 4/5, right lower 4/5, left lower able to lift off bed but not hold due to pain from hip surgery Motor Tone - Muscle tone was assessed at the neck and appendages and was normal. Sensory - Light touch, temperature/pinprick were assessed and were symmetrical.    Coordination -left arm ataxia  Gait and Station - deferred.  ASSESSMENT/PLAN Amanda [name] is a 89 y.o. female with history of chronic atrial fibrillation, diabetes mellitus, hypothyroidism. Patient presented after a fall at home and subsequent pain preventing ambulation and admitted on 5/13. On 5/15 she underwent an  open treatment of intertrochanteric, pertrochanteric and subtrochanteric fracture with intramedullary implant. On Thursday afternoon, new slurred speech and facial droop were noted; on review of her post-surgery course, slurred speech had also been noted after surgery on 5/15, which had been attributed to anesthesia. The facial droop noted Thursday   Stroke: Acute bilateral corona radiata ischemic infarcts, etiology:  cardioembolic in the setting of Xarelto being held for surgery  MRI  Acute lacunar infarct in the bilateral corona radiata. Generalized atrophy and chronic small vessel ischemia. MRA negative Carotid dopplers unremarkable 2D Echo 66 5%. LDL 119 HgbA1c 6.4 in 08/2022  VTE prophylaxis -Xarelto Xarelto (rivaroxaban) daily prior to admission, now on Xarelto (rivaroxaban) daily.  Therapy recommendations: SNF Disposition: Pending  Hypertension Chronic A-fib on Xarelto Home meds: Lisinopril 20 mg, Xarelto resumed Stable BP goal < 180/105 on AC Long-term BP goal normotensive  Hyperlipidemia Home meds: None LDL 119, goal < 70 Start low-dose Crestor 5 mg  Diabetes type II Controlled Home meds: Amaryl 1 mg HgbA1c 6.4 in 08/2022, goal <  7.0 CBGs SSI  Other Stroke Risk Factors Advanced Age >/= 59   Other Active Problems Hypothyroidism Left hip fracture s/p surgery AKI on CKD 3b  Hospital day # 5  Workup complete neurology to sign off please call with questions    To contact Stroke Continuity provider, please refer to [email or web address]. After hours, contact General Neurology

## 2022-10-05 DIAGNOSIS — S72142A Displaced intertrochanteric fracture of left femur, initial encounter for closed fracture: Secondary | ICD-10-CM | POA: Diagnosis not present

## 2022-10-05 LAB — BASIC METABOLIC PANEL
Anion gap: 12 (ref 5–15)
BUN: 46 mg/dL — ABNORMAL HIGH (ref 8–23)
CO2: 15 mmol/L — ABNORMAL LOW (ref 22–32)
Calcium: 7.6 mg/dL — ABNORMAL LOW (ref 8.9–10.3)
Chloride: 108 mmol/L (ref 98–111)
Creatinine, Ser: 1.05 mg/dL — ABNORMAL HIGH (ref 0.44–1.00)
GFR, Estimated: 47 mL/min — ABNORMAL LOW (ref >60)
Glucose, Bld: 165 mg/dL — ABNORMAL HIGH (ref 70–99)
Potassium: 5.3 mmol/L — ABNORMAL HIGH (ref 3.5–5.1)
Sodium: 135 mmol/L (ref 135–145)

## 2022-10-05 LAB — GLUCOSE, CAPILLARY
Glucose-Capillary: 144 mg/dL — ABNORMAL HIGH (ref 70–99)
Glucose-Capillary: 170 mg/dL — ABNORMAL HIGH (ref 70–99)
Glucose-Capillary: 154 mg/dL — ABNORMAL HIGH (ref 70–99)
Glucose-Capillary: 165 mg/dL — ABNORMAL HIGH (ref 70–99)

## 2022-10-05 LAB — POTASSIUM: Potassium: 5.3 mmol/L — ABNORMAL HIGH (ref 3.5–5.1)

## 2022-10-05 MED ORDER — OXYCODONE HCL 5 MG PO TABS
5.0000 mg | ORAL_TABLET | ORAL | Status: DC | PRN
  Filled 2022-10-05: qty 1

## 2022-10-05 MED ORDER — ACETAMINOPHEN 500 MG PO TABS: 500.0000 mg | ORAL_TABLET | ORAL | Status: DC | PRN

## 2022-10-05 MED ORDER — ACETAMINOPHEN 160 MG/5ML PO SOLN
500.0000 mg | Freq: Four times a day (QID) | ORAL | Status: DC | PRN
  Administered 2022-10-05: 500 mg via ORAL
  Filled 2022-10-05: qty 20.3

## 2022-10-05 MED ORDER — OXYCODONE HCL 5 MG PO TABS
10.0000 mg | ORAL_TABLET | ORAL | Status: DC | PRN
  Administered 2022-10-05 – 2022-10-06 (×3): 10 mg via ORAL
  Filled 2022-10-05 (×3): qty 2

## 2022-10-05 NOTE — Progress Notes (Signed)
PROGRESS NOTE    Amanda [name]  JYN:[id number] DOB: 06-Aug-1918 DOA: 09/29/2022 PCP: Lupita Raider, MD     Brief Narrative:  Amanda [name] is a 89 y.o. female with a history of chronic atrial fibrillation, diabetes mellitus, hypothyroidism. Patient presented after a fall at home and subsequent pain preventing ambulation. Patient was found to have a left hip fracture. Orthopedic surgery consulted for surgical management.  Patient underwent intramedullary nail fixation of left hip by Dr. Roda Shutters 5/15.  New events last 24 hours / Subjective: Patient is alert to voice.  She is able to tell me that she is at the hospital, she is here because she fell.  Does not answer all questions, seems to be sleepy.  She is able to follow simple commands to handgrip bilaterally and to wiggle her toes.  Assessment & Plan:   Principal Problem:   Displaced intertrochanteric fracture of left femur, initial encounter for closed fracture [facility]) Active Problems:   Hypothyroidism   Permanent atrial fibrillation (HCC)   Diabetes mellitus type 2, controlled (HCC)   HTN (hypertension)   Hip fracture (HCC)   Restless leg syndrome   DNR (do not resuscitate)   CVA (cerebral vascular accident) (HCC)  Left hip fracture secondary to mechanical fall -Orthopedic surgery consulted -S/p intramedullary nail fixation of left hip by Dr. Roda Shutters 5/15 -PT OT looking into CIR placement  Small bilateral acute CVA -MRI brain: Acute lacunar infarct in the bilateral corona radiata  -Appreciate neurology  -Echocardiogram with EF 60 to 65% -MRA head negative for large vessel occlusion -Bilateral carotid duplex consistent with 1 to 39% stenosis -LDL 68 -Continue Xarelto -PT OT SLP  AKI on CKD stage IIIa -Baseline creatinine 1 -Hold lisinopril -Renal ultrasound without hydronephrosis -Resolved  Hyperkalemia -Mild.  Monitor  Chronic A-fib -Xarelto   Diabetes mellitus type 2 -SSI  Hypertension -Lisinopril on hold  due to AKI  Hypothyroidism -Synthroid  RLS -Requip  CKD stage IIIa -Stable   DVT prophylaxis:  SCDs Start: 10/01/22 1829 Place TED hose Start: 10/01/22 1829 SCDs Start: 09/29/22 1516 Rivaroxaban (XARELTO) tablet 15 mg  Code Status: DNR Family Communication: No family at bedside Disposition Plan: Consideration for CIR Status is: Inpatient Remains inpatient appropriate because: CIR placement    Antimicrobials:  Anti-infectives (From admission, onward)    Start     Dose/Rate Route Frequency Ordered Stop   10/01/22 2000  ceFAZolin (ANCEF) IVPB 2g/100 mL premix        2 g 200 mL/hr over 30 Minutes Intravenous Every 6 hours 10/01/22 1828 10/03/22 0951        Objective: Vitals:   10/04/22 1650 10/04/22 2017 10/05/22 0437 10/05/22 0751  BP: (!) 128/43 (!) 122/53 121/77 109/67  Pulse: 81 90 (!) 101 80  Resp: 14 17 19 18   Temp: 98.7 F (37.1 C)   98.4 F (36.9 C)  TempSrc: Oral   Oral  SpO2: 100% 100% 91% 98%  Weight:      Height:        Intake/Output Summary (Last 24 hours) at 10/05/2022 1052 Last data filed at 10/05/2022 0700 Gross per 24 hour  Intake 3738.08 ml  Output 250 ml  Net 3488.08 ml    Filed Weights   09/29/22 1127  Weight: 68.9 kg    Examination:  General exam: Appears calm and comfortable  Respiratory system: Clear to auscultation. Respiratory effort normal. No respiratory distress.  Cardiovascular system: S1 & S2 heard, irreg rhythm  Gastrointestinal system: Abdomen is nondistended,  soft  Central nervous system: Alert to voice, moves all 4 extremities spontaneously, continues to have dysarthria Extremities: Symmetric in appearance  Skin: No rashes, lesions or ulcers on exposed skin   Data Reviewed: I have personally reviewed following labs and imaging studies  CBC: Recent Labs  Lab 09/29/22 1135 10/01/22 0625 10/02/22 0128 10/03/22 0851 10/04/22 0241  WBC 9.2 14.8* 13.5* 11.3* 12.1*  NEUTROABS 7.0  --   --   --   --   HGB 11.2*  8.2* 8.9* 7.5* 7.4*  HCT 35.7* 25.2* 27.1* 24.0* 23.2*  MCV 103.2* 98.1 96.1 101.7* 100.4*  PLT 190 129* 127* 120* 134*    Basic Metabolic Panel: Recent Labs  Lab 10/02/22 0128 10/02/22 0812 10/02/22 1317 10/03/22 0851 10/04/22 0241 10/05/22 0705 10/05/22 1010  NA 129* 130*  --  131* 132* 135  --   K 6.0* 5.5* 4.7 4.9 4.9 5.3* 5.3*  CL 97* 97*  --  102 103 108  --   CO2 21* 23  --  22 20* 15*  --   GLUCOSE 255* 231*  --  183* 172* 165*  --   BUN 38* 40*  --  45* 47* 46*  --   CREATININE 1.30* 1.40*  --  1.39* 1.28* 1.05*  --   CALCIUM 8.0* 8.1*  --  7.7* 7.9* 7.6*  --     GFR: Estimated Creatinine Clearance: 25.2 mL/min (A) (by C-G formula based on SCr of 1.05 mg/dL (H)). Liver Function Tests: No results for input(s): "AST", "ALT", "ALKPHOS", "BILITOT", "PROT", "ALBUMIN" in the last 168 hours. No results for input(s): "LIPASE", "AMYLASE" in the last 168 hours. No results for input(s): "AMMONIA" in the last 168 hours. Coagulation Profile: Recent Labs  Lab 09/29/22 1135  INR 1.4*    Cardiac Enzymes: No results for input(s): "CKTOTAL", "CKMB", "CKMBINDEX", "TROPONINI" in the last 168 hours. BNP (last 3 results) No results for input(s): "PROBNP" in the last 8760 hours. HbA1C: No results for input(s): "HGBA1C" in the last 72 hours. CBG: Recent Labs  Lab 10/04/22 0827 10/04/22 1144 10/04/22 1648 10/04/22 2145 10/05/22 0751  GLUCAP 161* 154* 114* 112* 144*    Lipid Profile: Recent Labs    10/04/22 0241  CHOL 134  HDL 48  LDLCALC 68  TRIG 91  CHOLHDL 2.8    Thyroid Function Tests: No results for input(s): "TSH", "T4TOTAL", "FREET4", "T3FREE", "THYROIDAB" in the last 72 hours. Anemia Panel: No results for input(s): "VITAMINB12", "FOLATE", "FERRITIN", "TIBC", "IRON", "RETICCTPCT" in the last 72 hours. Sepsis Labs: No results for input(s): "PROCALCITON", "LATICACIDVEN" in the last 168 hours.  Recent Results (from the past 240 hour(s))  Surgical pcr  screen     Status: None   Collection Time: 09/30/22  5:40 PM   Specimen: Nasal Mucosa; Nasal Swab  Result Value Ref Range Status   MRSA, PCR NEGATIVE NEGATIVE Final   Staphylococcus aureus NEGATIVE NEGATIVE Final    Comment: (NOTE) The Xpert SA Assay (FDA approved for NASAL specimens in patients 72 years of age and older), is one component of a comprehensive surveillance program. It is not intended to diagnose infection nor to guide or monitor treatment. Performed at [facility], [address], [place], [place] [address]       Radiology Studies: VAS US CAROTID  Result Date: 10/04/2022 Carotid Arterial Duplex Study Patient Name:  JOSELY DINELLI  Date of Exam:   10/03/2022 Medical Rec #: [id number]  Accession #:    [id number] Date of Birth: 30-Dec-1918           Patient Gender: F Patient Age:   89 years Exam Location:  [facility] Procedure:      VAS US CAROTID Referring Phys: Angelique Blonder WOLFE --------------------------------------------------------------------------------  Indications:       CVA. Risk Factors:      Hypertension, Diabetes. Comparison Study:  01/01/2019 - Right Carotid: The extracranial vessels were                    near-normal with only minimal                    wall                    thickening or plaque.                     Left Carotid: The extracranial vessels were near-normal with                    only minimal                    wall                    thickening or plaque.                     Vertebrals: Bilateral vertebral arteries demonstrate                    antegrade flow.                    Subclavians: Normal flow hemodynamics were seen in bilateral                    subclavian                    arteries. Performing Technologist: Chanda Busing RVT  Examination Guidelines: A complete evaluation includes B-mode imaging, spectral Doppler, color Doppler, and power Doppler as needed of all accessible portions of each vessel. Bilateral testing  is considered an integral part of a complete examination. Limited examinations for reoccurring indications may be performed as noted.  Right Carotid Findings: +----------+--------+--------+--------+-----------------------+--------+           PSV cm/sEDV cm/sStenosisPlaque Description     Comments +----------+--------+--------+--------+-----------------------+--------+ CCA Prox  96      10              smooth and heterogenous         +----------+--------+--------+--------+-----------------------+--------+ CCA Distal96      13              smooth and heterogenous         +----------+--------+--------+--------+-----------------------+--------+ ICA Prox  69      17              smooth and heterogenous         +----------+--------+--------+--------+-----------------------+--------+ ICA Mid   103     27              smooth and heterogenous         +----------+--------+--------+--------+-----------------------+--------+ ICA Distal104     21  tortuous +----------+--------+--------+--------+-----------------------+--------+ ECA       104     0                                               +----------+--------+--------+--------+-----------------------+--------+ +----------+--------+-------+--------+-------------------+           PSV cm/sEDV cmsDescribeArm Pressure (mmHG) +----------+--------+-------+--------+-------------------+ [id number]                                        +----------+--------+-------+--------+-------------------+ +---------+--------+--+--------+--+---------+ VertebralPSV cm/s57EDV cm/s12Antegrade +---------+--------+--+--------+--+---------+  Left Carotid Findings: +----------+--------+--------+--------+-----------------------+--------+           PSV cm/sEDV cm/sStenosisPlaque Description     Comments +----------+--------+--------+--------+-----------------------+--------+ CCA Prox  140      13              smooth and heterogenous         +----------+--------+--------+--------+-----------------------+--------+ CCA Distal100     13              smooth and heterogenous         +----------+--------+--------+--------+-----------------------+--------+ ICA Prox  93      20                                              +----------+--------+--------+--------+-----------------------+--------+ ICA Mid   91      23              smooth and heterogenous         +----------+--------+--------+--------+-----------------------+--------+ ICA Distal125     29                                     tortuous +----------+--------+--------+--------+-----------------------+--------+ ECA       101     0                                               +----------+--------+--------+--------+-----------------------+--------+ +----------+--------+--------+--------+-------------------+           PSV cm/sEDV cm/sDescribeArm Pressure (mmHG) +----------+--------+--------+--------+-------------------+ [id number]                                         +----------+--------+--------+--------+-------------------+ +---------+--------+--+--------+--+---------+ VertebralPSV cm/s56EDV cm/s13Antegrade +---------+--------+--+--------+--+---------+   Summary: Right Carotid: Velocities in the right ICA are consistent with a 1-39% stenosis. Left Carotid: Velocities in the left ICA are consistent with a 1-39% stenosis. Vertebrals: Bilateral vertebral arteries demonstrate antegrade flow. *See table(s) above for measurements and observations.  Electronically signed by Gerarda Fraction on 10/04/2022 at 8:51:06 AM.    Final    MR ANGIO HEAD WO CONTRAST  Result Date: 10/04/2022 CLINICAL DATA:  Follow-up examination for stroke. EXAM: MRA HEAD WITHOUT CONTRAST TECHNIQUE: Angiographic images of the Circle of Willis were acquired using MRA technique without intravenous contrast. COMPARISON:  Prior MRI  from 10/02/2022. FINDINGS: Anterior circulation: Examination moderately to severely degraded by motion artifact. Both internal carotid arteries are patent  through the siphons without visible stenosis or other abnormality. A1 segments patent. Grossly normal anterior communicating complex. Anterior cerebral arteries patent without visible stenosis. No M1 stenosis or occlusion. No visible proximal MCA branch occlusion or high-grade stenosis. Distal MCA branches grossly perfused and symmetric. Posterior circulation: Visualized V4 segments patent without stenosis. Left PICA grossly patent at its origin. Right PICA origin not well seen on this motion degraded exam. Basilar patent without stenosis. Superior cerebral arteries patent bilaterally. Both PCAs primarily supplied via the basilar. Suspected atheromatous irregularity within both PCAs which remain patent to their distal aspects. Evaluation for potential stenosis fairly limited by motion. Anatomic variants: None significant. Other: No visible aneurysm. IMPRESSION: 1. Technically limited exam due to extensive motion artifact. 2. Negative intracranial MRA for large vessel occlusion. Please note that evaluation for potential stenoses is fairly limited given motion artifact. Electronically Signed   By: Rise Mu M.D.   On: 10/04/2022 04:10   ECHOCARDIOGRAM COMPLETE  Result Date: 10/03/2022    ECHOCARDIOGRAM REPORT   Patient Name:   ASON STURM Date of Exam: 10/03/2022 Medical Rec #:  [id number]          Height:       63.0 in Accession #:    [id number]         Weight:       151.9 lb Date of Birth:  [birth year]          BSA:          1.720 m Patient Age:    89 years          BP:           120/37 mmHg Patient Gender: F                  HR:           91 bpm. Exam Location:  Inpatient Procedure: 2D Echo, Cardiac Doppler and Color Doppler Indications:    stroke  History:        Patient has prior history of Echocardiogram examinations, most                  recent 03/18/2022. Arrythmias:Atrial Fibrillation; Risk                 Factors:Diabetes and Hypertension.  Sonographer:    Delcie Roch RDCS Referring Phys: [id number] Kamrynn Melott IMPRESSIONS  1. Left ventricular ejection fraction, by estimation, is 60 to 65%. The left ventricle has normal function. The left ventricle has no regional wall motion abnormalities. There is mild concentric left ventricular hypertrophy. Left ventricular diastolic function could not be evaluated.  2. Right ventricular systolic function is mildly reduced. The right ventricular size is normal. There is severely elevated pulmonary artery systolic pressure.  3. Left atrial size was moderately dilated.  4. Right atrial size was severely dilated.  5. The mitral valve is degenerative. Trivial mitral valve regurgitation. No evidence of mitral stenosis.  6. Tricuspid valve regurgitation is severe.  7. The aortic valve is tricuspid. Aortic valve regurgitation is not visualized. No aortic stenosis is present.  8. The inferior vena cava is normal in size with greater than 50% respiratory variability, suggesting right atrial pressure of 3 mmHg. FINDINGS  Left Ventricle: Left ventricular ejection fraction, by estimation, is 60 to 65%. The left ventricle has normal function. The left ventricle has no regional wall motion abnormalities. The left ventricular internal cavity size was normal in size. There is  mild concentric left  ventricular hypertrophy. Left ventricular diastolic function could not be evaluated due to atrial fibrillation. Left ventricular diastolic function could not be evaluated. Right Ventricle: The right ventricular size is normal. No increase in right ventricular wall thickness. Right ventricular systolic function is mildly reduced. There is severely elevated pulmonary artery systolic pressure. The tricuspid regurgitant velocity is 3.98 m/s, and with an assumed right atrial pressure of 5 mmHg, the estimated right ventricular  systolic pressure is 68.4 mmHg. Left Atrium: Left atrial size was moderately dilated. Right Atrium: Right atrial size was severely dilated. Pericardium: Trivial pericardial effusion is present. The pericardial effusion is posterior to the left ventricle. Mitral Valve: The mitral valve is degenerative in appearance. Trivial mitral valve regurgitation. No evidence of mitral valve stenosis. Tricuspid Valve: The tricuspid valve is normal in structure. Tricuspid valve regurgitation is severe. No evidence of tricuspid stenosis. Aortic Valve: The aortic valve is tricuspid. Aortic valve regurgitation is not visualized. No aortic stenosis is present. Pulmonic Valve: The pulmonic valve was normal in structure. Pulmonic valve regurgitation is trivial. No evidence of pulmonic stenosis. Aorta: The aortic root is normal in size and structure. Venous: The inferior vena cava is normal in size with greater than 50% respiratory variability, suggesting right atrial pressure of 3 mmHg. IAS/Shunts: No atrial level shunt detected by color flow Doppler.  LEFT VENTRICLE PLAX 2D LVIDd:         3.80 cm LVIDs:         2.70 cm LV PW:         1.30 cm LV IVS:        1.00 cm LVOT diam:     1.80 cm LV SV:         40 LV SV Index:   23 LVOT Area:     2.54 cm  RIGHT VENTRICLE          IVC RV Basal diam:  2.90 cm  IVC diam: 1.70 cm TAPSE (M-mode): 0.9 cm LEFT ATRIUM             Index        RIGHT ATRIUM           Index LA diam:        3.90 cm 2.27 cm/m   RA Area:     23.90 cm LA Vol (A2C):   51.8 ml 30.11 ml/m  RA Volume:   80.20 ml  46.62 ml/m LA Vol (A4C):   60.7 ml 35.29 ml/m LA Biplane Vol: 59.2 ml 34.41 ml/m  AORTIC VALVE LVOT Vmax:   109.00 cm/s LVOT Vmean:  67.700 cm/s LVOT VTI:    0.158 m  AORTA Ao Root diam: 2.90 cm Ao Asc diam:  2.90 cm TRICUSPID VALVE TR Peak grad:   63.4 mmHg TR Vmax:        398.00 cm/s  SHUNTS Systemic VTI:  0.16 m Systemic Diam: 1.80 cm Arvilla Meres MD Electronically signed by Arvilla Meres MD Signature  Date/Time: 10/03/2022/6:07:58 PM    Final    US RENAL  Result Date: 10/03/2022 CLINICAL DATA:  [id number] AKI (acute kidney injury) (HCC) [id number] EXAM: RENAL / URINARY TRACT ULTRASOUND COMPLETE COMPARISON:  06/13/2009 FINDINGS: Right Kidney: Renal measurements: 8.3 x 3.8 x 4.6 cm = volume: 76.5 mL. No hydronephrosis. Increased renal cortical echogenicity. Left Kidney: Renal measurements: 9.7 x 3.9 x 4.5 cm = volume: 89.0 mL. No hydronephrosis. Increased renal cortical echogenicity. Bladder: Not visualized, presumably decompressed Other: None. IMPRESSION: No hydronephrosis. Increased renal cortical echogenicity bilaterally, as  can be seen in medical renal disease. Electronically Signed   By: Caprice Renshaw M.D.   On: 10/03/2022 12:57      Scheduled Meds:  Chlorhexidine Gluconate Cloth  6 each Topical Daily   docusate  100 mg Oral BID   feeding supplement  237 mL Oral BID BM   insulin aspart  0-9 Units Subcutaneous TID WC   levothyroxine  88 mcg Oral Q0600   loratadine  10 mg Oral Daily   rivaroxaban  15 mg Oral Q supper   rOPINIRole  0.25 mg Oral QHS   rosuvastatin  5 mg Oral Daily   Continuous Infusions:  sodium chloride 100 mL/hr at 10/05/22 1024   methocarbamol (ROBAXIN) IV       LOS: 6 days   Time spent: 25 minutes   Noralee Stain, DO [facility] 10/05/2022, 10:52 AM   Available via Epic secure chat 7am-7pm After these hours, please refer to coverage provider listed on [email or web address]

## 2022-10-05 NOTE — Progress Notes (Signed)
Speech Language Pathology Treatment: Dysphagia  Patient Details Name: Amanda [name] MRN: [id number] DOB: April 29, 1919 Today's Date: 10/05/2022 Time: 4098-1191 SLP Time Calculation (min) (ACUTE ONLY): 30 min  Assessment / Plan / Recommendation Clinical Impression  Patient seen by SLP for skilled treatment focused on dysphagia goals. Patient's daughter and granddaughter were both in the room. Patient was awake and alert but fatigued, drowsy and c/o pain from hip. She was agreeable to having some nectar thickened water. Initially, she was able to draw liquid through straw to drink but after a few sips, she then was not able to drink through straw and would instead blow into straw. SLP retrieved a 10cc Provale cup as it has a sip top lid and double handles. Patient was able to manage holding cup and drinking from it but it took some getting used to as the flow of the nectar thick liquid through the Provale lid is slower. She did have one instance of delayed cough. Per her granddaughter, she has been having congested sounding cough but not productive. SLP recommending family continue offering nectar thick liquids and can use straw, regular cup, Provale cup. SLP will continue to follow.   HPI HPI: Pt is a 89 year old female admitted after a fall and subsequent pain preventing ambulation. Patient was found to have a left hip fracture. Underwnt surgery on 5/15. Reported feeling of food and liquid in throat. Family reports this has been ongoing for a while. She is afraid of choking. Found to have had acute CVA perioperatively. MRI shows Acute lacunar infarct in the bilateral corona radiata.      SLP Plan  Continue with current plan of care      Recommendations for follow up therapy are one component of a multi-disciplinary discharge planning process, led by the attending physician.  Recommendations may be updated based on patient status, additional functional criteria and insurance authorization.     Recommendations  Diet recommendations: Nectar-thick liquid;Dysphagia 1 (puree) Liquids provided via: Cup;Straw;Teaspoon Medication Administration: Crushed with puree Supervision: Full supervision/cueing for compensatory strategies;Staff to assist with self feeding;Trained caregiver to feed patient Compensations: Follow solids with liquid;Slow rate;Small sips/bites;Minimize environmental distractions Postural Changes and/or Swallow Maneuvers: Seated upright 90 degrees;Upright 30-60 min after meal                  Oral care BID   Frequent or constant Supervision/Assistance Dysphagia, oropharyngeal phase (R13.12)     Continue with current plan of care    Angela Nevin, MA, CCC-SLP Speech Therapy

## 2022-10-06 ENCOUNTER — Other Ambulatory Visit: Payer: Self-pay

## 2022-10-06 ENCOUNTER — Inpatient Hospital Stay (HOSPITAL_COMMUNITY)
Admission: RE | Admit: 2022-10-06 | Discharge: 2022-10-08 | DRG: 057 | Disposition: A | Discharge status: Hospice | Payer: Medicare Other | Source: Intra-hospital | Attending: Physical Medicine & Rehabilitation | Admitting: Physical Medicine & Rehabilitation

## 2022-10-06 ENCOUNTER — Encounter (HOSPITAL_COMMUNITY): Payer: Self-pay | Admitting: Physical Medicine & Rehabilitation

## 2022-10-06 DIAGNOSIS — W109XXD Fall (on) (from) unspecified stairs and steps, subsequent encounter: Secondary | ICD-10-CM | POA: Diagnosis present

## 2022-10-06 DIAGNOSIS — I69319 Unspecified symptoms and signs involving cognitive functions following cerebral infarction: Secondary | ICD-10-CM

## 2022-10-06 DIAGNOSIS — Z7984 Long term (current) use of oral hypoglycemic drugs: Secondary | ICD-10-CM

## 2022-10-06 DIAGNOSIS — F411 Generalized anxiety disorder: Secondary | ICD-10-CM | POA: Diagnosis not present

## 2022-10-06 DIAGNOSIS — I69322 Dysarthria following cerebral infarction: Secondary | ICD-10-CM

## 2022-10-06 DIAGNOSIS — I69391 Dysphagia following cerebral infarction: Principal | ICD-10-CM

## 2022-10-06 DIAGNOSIS — Z882 Allergy status to sulfonamides status: Secondary | ICD-10-CM | POA: Diagnosis not present

## 2022-10-06 DIAGNOSIS — I482 Chronic atrial fibrillation, unspecified: Secondary | ICD-10-CM | POA: Diagnosis present

## 2022-10-06 DIAGNOSIS — Z79890 Hormone replacement therapy (postmenopausal): Secondary | ICD-10-CM | POA: Diagnosis not present

## 2022-10-06 DIAGNOSIS — Z885 Allergy status to narcotic agent status: Secondary | ICD-10-CM

## 2022-10-06 DIAGNOSIS — I639 Cerebral infarction, unspecified: Secondary | ICD-10-CM | POA: Diagnosis not present

## 2022-10-06 DIAGNOSIS — I6381 Other cerebral infarction due to occlusion or stenosis of small artery: Secondary | ICD-10-CM | POA: Diagnosis present

## 2022-10-06 DIAGNOSIS — Z79899 Other long term (current) drug therapy: Secondary | ICD-10-CM

## 2022-10-06 DIAGNOSIS — I10 Essential (primary) hypertension: Secondary | ICD-10-CM | POA: Diagnosis present

## 2022-10-06 DIAGNOSIS — Z515 Encounter for palliative care: Secondary | ICD-10-CM

## 2022-10-06 DIAGNOSIS — Z833 Family history of diabetes mellitus: Secondary | ICD-10-CM | POA: Diagnosis not present

## 2022-10-06 DIAGNOSIS — F329 Major depressive disorder, single episode, unspecified: Secondary | ICD-10-CM | POA: Diagnosis present

## 2022-10-06 DIAGNOSIS — R0902 Hypoxemia: Secondary | ICD-10-CM | POA: Diagnosis not present

## 2022-10-06 DIAGNOSIS — Z7401 Bed confinement status: Secondary | ICD-10-CM | POA: Diagnosis not present

## 2022-10-06 DIAGNOSIS — Z7901 Long term (current) use of anticoagulants: Secondary | ICD-10-CM

## 2022-10-06 DIAGNOSIS — E119 Type 2 diabetes mellitus without complications: Secondary | ICD-10-CM | POA: Diagnosis present

## 2022-10-06 DIAGNOSIS — Z91013 Allergy to seafood: Secondary | ICD-10-CM | POA: Diagnosis not present

## 2022-10-06 DIAGNOSIS — Z8249 Family history of ischemic heart disease and other diseases of the circulatory system: Secondary | ICD-10-CM

## 2022-10-06 DIAGNOSIS — D62 Acute posthemorrhagic anemia: Secondary | ICD-10-CM | POA: Diagnosis present

## 2022-10-06 DIAGNOSIS — I4891 Unspecified atrial fibrillation: Secondary | ICD-10-CM | POA: Diagnosis not present

## 2022-10-06 DIAGNOSIS — Z888 Allergy status to other drugs, medicaments and biological substances status: Secondary | ICD-10-CM | POA: Diagnosis not present

## 2022-10-06 DIAGNOSIS — S72142A Displaced intertrochanteric fracture of left femur, initial encounter for closed fracture: Secondary | ICD-10-CM | POA: Diagnosis not present

## 2022-10-06 DIAGNOSIS — I69392 Facial weakness following cerebral infarction: Secondary | ICD-10-CM

## 2022-10-06 DIAGNOSIS — E039 Hypothyroidism, unspecified: Secondary | ICD-10-CM | POA: Diagnosis present

## 2022-10-06 DIAGNOSIS — S72142D Displaced intertrochanteric fracture of left femur, subsequent encounter for closed fracture with routine healing: Secondary | ICD-10-CM | POA: Diagnosis not present

## 2022-10-06 DIAGNOSIS — Z823 Family history of stroke: Secondary | ICD-10-CM

## 2022-10-06 DIAGNOSIS — R1310 Dysphagia, unspecified: Secondary | ICD-10-CM | POA: Diagnosis present

## 2022-10-06 DIAGNOSIS — R4182 Altered mental status, unspecified: Secondary | ICD-10-CM | POA: Diagnosis not present

## 2022-10-06 DIAGNOSIS — E785 Hyperlipidemia, unspecified: Secondary | ICD-10-CM | POA: Diagnosis present

## 2022-10-06 DIAGNOSIS — R627 Adult failure to thrive: Secondary | ICD-10-CM | POA: Diagnosis present

## 2022-10-06 DIAGNOSIS — I69318 Other symptoms and signs involving cognitive functions following cerebral infarction: Secondary | ICD-10-CM | POA: Diagnosis not present

## 2022-10-06 DIAGNOSIS — Z66 Do not resuscitate: Secondary | ICD-10-CM | POA: Diagnosis present

## 2022-10-06 DIAGNOSIS — S72002D Fracture of unspecified part of neck of left femur, subsequent encounter for closed fracture with routine healing: Secondary | ICD-10-CM | POA: Diagnosis not present

## 2022-10-06 LAB — CBC
HCT: 20.8 % — ABNORMAL LOW (ref 36.0–46.0)
Hemoglobin: 6.5 g/dL — CL (ref 12.0–15.0)
MCH: 31.9 pg (ref 26.0–34.0)
MCHC: 31.3 g/dL (ref 30.0–36.0)
MCV: 102 fL — ABNORMAL HIGH (ref 80.0–100.0)
Platelets: 183 10*3/uL (ref 150–400)
RBC: 2.04 MIL/uL — ABNORMAL LOW (ref 3.87–5.11)
RDW: 15.5 % (ref 11.5–15.5)
WBC: 10.2 10*3/uL (ref 4.0–10.5)
nRBC: 0.5 % — ABNORMAL HIGH (ref 0.0–0.2)

## 2022-10-06 LAB — BASIC METABOLIC PANEL
Anion gap: 6 (ref 5–15)
Anion gap: 6 (ref 5–15)
BUN: 49 mg/dL — ABNORMAL HIGH (ref 8–23)
BUN: 41 mg/dL — ABNORMAL HIGH (ref 8–23)
CO2: 19 mmol/L — ABNORMAL LOW (ref 22–32)
CO2: 20 mmol/L — ABNORMAL LOW (ref 22–32)
Calcium: 7.3 mg/dL — ABNORMAL LOW (ref 8.9–10.3)
Calcium: 7.8 mg/dL — ABNORMAL LOW (ref 8.9–10.3)
Chloride: 109 mmol/L (ref 98–111)
Chloride: 110 mmol/L (ref 98–111)
Creatinine, Ser: 1.1 mg/dL — ABNORMAL HIGH (ref 0.44–1.00)
Creatinine, Ser: 0.93 mg/dL (ref 0.44–1.00)
GFR, Estimated: 44 mL/min — ABNORMAL LOW (ref >60)
GFR, Estimated: 54 mL/min — ABNORMAL LOW (ref >60)
Glucose, Bld: 187 mg/dL — ABNORMAL HIGH (ref 70–99)
Glucose, Bld: 177 mg/dL — ABNORMAL HIGH (ref 70–99)
Potassium: 4.5 mmol/L (ref 3.5–5.1)
Potassium: 4.8 mmol/L (ref 3.5–5.1)
Sodium: 134 mmol/L — ABNORMAL LOW (ref 135–145)
Sodium: 136 mmol/L (ref 135–145)

## 2022-10-06 LAB — GLUCOSE, CAPILLARY
Glucose-Capillary: 163 mg/dL — ABNORMAL HIGH (ref 70–99)
Glucose-Capillary: 175 mg/dL — ABNORMAL HIGH (ref 70–99)
Glucose-Capillary: 153 mg/dL — ABNORMAL HIGH (ref 70–99)
Glucose-Capillary: 142 mg/dL — ABNORMAL HIGH (ref 70–99)
Glucose-Capillary: 155 mg/dL — ABNORMAL HIGH (ref 70–99)

## 2022-10-06 LAB — HEMOGLOBIN AND HEMATOCRIT, BLOOD
HCT: 25.5 % — ABNORMAL LOW (ref 36.0–46.0)
Hemoglobin: 8.3 g/dL — ABNORMAL LOW (ref 12.0–15.0)

## 2022-10-06 LAB — PREPARE RBC (CROSSMATCH)

## 2022-10-06 LAB — BPAM RBC: ISSUE DATE / TIME: 202405200619

## 2022-10-06 MED ORDER — INSULIN ASPART 100 UNIT/ML IJ SOLN
0.0000 [IU] | Freq: Three times a day (TID) | INTRAMUSCULAR | Status: DC
  Administered 2022-10-06 – 2022-10-07 (×3): 1 [IU] via SUBCUTANEOUS
  Administered 2022-10-07 – 2022-10-08 (×2): 2 [IU] via SUBCUTANEOUS

## 2022-10-06 MED ORDER — OXYCODONE HCL 5 MG PO TABS
10.0000 mg | ORAL_TABLET | ORAL | Status: DC | PRN
  Administered 2022-10-06 – 2022-10-08 (×4): 10 mg via ORAL
  Filled 2022-10-06 (×5): qty 2

## 2022-10-06 MED ORDER — LORATADINE 10 MG PO TABS
10.0000 mg | ORAL_TABLET | Freq: Every day | ORAL | Status: DC
  Administered 2022-10-07: 10 mg via ORAL
  Filled 2022-10-06: qty 1

## 2022-10-06 MED ORDER — PROCHLORPERAZINE 25 MG RE SUPP: 12.5000 mg | Freq: Four times a day (QID) | RECTAL | Status: DC | PRN

## 2022-10-06 MED ORDER — GUAIFENESIN-DM 100-10 MG/5ML PO SYRP: 5.0000 mL | ORAL_SOLUTION | Freq: Four times a day (QID) | ORAL | Status: DC | PRN

## 2022-10-06 MED ORDER — DIPHENHYDRAMINE HCL 25 MG PO CAPS: 25.0000 mg | ORAL_CAPSULE | Freq: Four times a day (QID) | ORAL | Status: DC | PRN

## 2022-10-06 MED ORDER — SENNOSIDES-DOCUSATE SODIUM 8.6-50 MG PO TABS: 1.0000 | ORAL_TABLET | Freq: Every evening | ORAL | Status: DC | PRN

## 2022-10-06 MED ORDER — ACETAMINOPHEN 325 MG PO TABS
325.0000 mg | ORAL_TABLET | ORAL | Status: DC | PRN
  Administered 2022-10-06 – 2022-10-08 (×4): 650 mg via ORAL
  Filled 2022-10-06 (×4): qty 2

## 2022-10-06 MED ORDER — SALINE SPRAY 0.65 % NA SOLN
1.0000 | Freq: Every day | NASAL | Status: DC | PRN
  Filled 2022-10-06: qty 44

## 2022-10-06 MED ORDER — RIVAROXABAN 15 MG PO TABS
15.0000 mg | ORAL_TABLET | Freq: Every day | ORAL | Status: DC
  Administered 2022-10-06 – 2022-10-07 (×2): 15 mg via ORAL
  Filled 2022-10-06 (×3): qty 1

## 2022-10-06 MED ORDER — ALUM & MAG HYDROXIDE-SIMETH 200-200-20 MG/5ML PO SUSP: 30.0000 mL | ORAL | Status: DC | PRN

## 2022-10-06 MED ORDER — PROCHLORPERAZINE EDISYLATE 10 MG/2ML IJ SOLN: 5.0000 mg | Freq: Four times a day (QID) | INTRAMUSCULAR | Status: DC | PRN

## 2022-10-06 MED ORDER — ROPINIROLE HCL 0.25 MG PO TABS
0.2500 mg | ORAL_TABLET | Freq: Every day | ORAL | Status: DC
  Administered 2022-10-06 – 2022-10-07 (×2): 0.25 mg via ORAL
  Filled 2022-10-06 (×3): qty 1

## 2022-10-06 MED ORDER — FLEET ENEMA 7-19 GM/118ML RE ENEM: 1.0000 | ENEMA | Freq: Once | RECTAL | Status: DC | PRN

## 2022-10-06 MED ORDER — DOCUSATE SODIUM 50 MG/5ML PO LIQD
100.0000 mg | Freq: Two times a day (BID) | ORAL | Status: DC
  Administered 2022-10-06 – 2022-10-07 (×3): 100 mg via ORAL
  Filled 2022-10-06 (×3): qty 10

## 2022-10-06 MED ORDER — ROSUVASTATIN CALCIUM 5 MG PO TABS
5.0000 mg | ORAL_TABLET | Freq: Every day | ORAL | Status: DC
  Administered 2022-10-07: 5 mg via ORAL
  Filled 2022-10-06: qty 1

## 2022-10-06 MED ORDER — SODIUM CHLORIDE 0.9% IV SOLUTION: Freq: Once | INTRAVENOUS | Status: AC

## 2022-10-06 MED ORDER — PROCHLORPERAZINE MALEATE 5 MG PO TABS: 5.0000 mg | ORAL_TABLET | Freq: Four times a day (QID) | ORAL | Status: DC | PRN

## 2022-10-06 MED ORDER — DOCUSATE SODIUM 50 MG/5ML PO LIQD: 100.0000 mg | Freq: Two times a day (BID) | ORAL | 0 refills | Status: DC

## 2022-10-06 MED ORDER — LEVOTHYROXINE SODIUM 88 MCG PO TABS
88.0000 ug | ORAL_TABLET | Freq: Every day | ORAL | Status: DC
  Administered 2022-10-07 – 2022-10-08 (×2): 88 ug via ORAL
  Filled 2022-10-06 (×2): qty 1

## 2022-10-06 MED ORDER — BISACODYL 10 MG RE SUPP: 10.0000 mg | Freq: Every day | RECTAL | Status: DC | PRN

## 2022-10-06 MED ORDER — TRAZODONE HCL 50 MG PO TABS
25.0000 mg | ORAL_TABLET | Freq: Every evening | ORAL | Status: DC | PRN
  Administered 2022-10-06: 25 mg via ORAL
  Filled 2022-10-06: qty 1

## 2022-10-06 MED ORDER — ROSUVASTATIN CALCIUM 5 MG PO TABS: 5.0000 mg | ORAL_TABLET | Freq: Every day | ORAL | Status: DC

## 2022-10-06 MED ORDER — GLYCERIN (LAXATIVE) 2 G RE SUPP: 1.0000 | Freq: Every day | RECTAL | 0 refills | Status: DC | PRN

## 2022-10-06 MED ORDER — METHOCARBAMOL 500 MG PO TABS
500.0000 mg | ORAL_TABLET | Freq: Four times a day (QID) | ORAL | Status: DC | PRN
  Administered 2022-10-06 – 2022-10-08 (×4): 500 mg via ORAL
  Filled 2022-10-06 (×4): qty 1

## 2022-10-06 MED ORDER — ENSURE MAX PROTEIN PO LIQD
11.0000 [oz_av] | Freq: Two times a day (BID) | ORAL | Status: DC
  Administered 2022-10-06 – 2022-10-07 (×2): 11 [oz_av] via ORAL

## 2022-10-06 NOTE — Discharge Summary (Signed)
Physician Discharge Summary  Amanda [name] BJY:[id number] DOB: 06/15/1918 DOA: 09/29/2022  PCP: Lupita Raider, MD  Admit date: 09/29/2022 Discharge date: 10/06/2022  Admitted From: Home Disposition:  CIR  Recommendations for Outpatient Follow-up:  Follow up with PCP in 1 week Follow up with Neurology in 4 weeks  Follow up CBC to check Hgb  Discharge Condition: Stable CODE STATUS: DNR  Diet recommendation: Dys 1   Brief/Interim Summary: Amanda [name] is a 89 y.o. female with a history of chronic atrial fibrillation, diabetes mellitus, hypothyroidism. Patient presented after a fall at home and subsequent pain preventing ambulation. Patient was found to have a left hip fracture. Orthopedic surgery consulted for surgical management.  Patient underwent intramedullary nail fixation of left hip by Dr. Roda Shutters 5/15.  Hospitalization complicated by dysarthria and dysphagia.  Further neurologic workup revealed acute CVA.  Neurology team was consulted.  Patient was resumed on Xarelto.  Hospitalization also complicated by acute kidney injury, has been resolved with IV fluid.  Lastly, she did have acute blood loss anemia following surgery and required 1 unit packed red blood cell 5/20.  Discharge Diagnoses:   Principal Problem:   Displaced intertrochanteric fracture of left femur, initial encounter for closed fracture [facility]) Active Problems:   Hypothyroidism   Permanent atrial fibrillation (HCC)   Diabetes mellitus type 2, controlled (HCC)   HTN (hypertension)   Hip fracture (HCC)   Restless leg syndrome   DNR (do not resuscitate)   CVA (cerebral vascular accident) (HCC)    Left hip fracture secondary to mechanical fall -Orthopedic surgery consulted -S/p intramedullary nail fixation of left hip by Dr. Roda Shutters 5/15 -PT OT looking into CIR placement   Small bilateral acute CVA -MRI brain: Acute lacunar infarct in the bilateral corona radiata  -Appreciate neurology  -Echocardiogram with  EF 60 to 65% -MRA head negative for large vessel occlusion -Bilateral carotid duplex consistent with 1 to 39% stenosis -LDL 68 -Continue Xarelto -PT OT SLP  Acute blood loss anemia -Slowly trended downward after surgery.  No other evidence of blood loss -Transfuse 1 unit packed red blood cell today -Continue to monitor CBC while on Xarelto   AKI on CKD stage IIIa -Baseline creatinine 1 -Hold lisinopril -Renal ultrasound without hydronephrosis -Resolved   Hyperkalemia -Resolved   Chronic A-fib -Xarelto    Diabetes mellitus type 2 -SSI   Hypertension -Lisinopril on hold due to AKI   Hypothyroidism -Synthroid   RLS -Requip     Discharge Instructions  Discharge Instructions     Ambulatory referral to Neurology   Complete by: As directed    An appointment is requested in approximately: 4 weeks   Increase activity slowly   Complete by: As directed    Weight bearing as tolerated   Complete by: As directed       Allergies as of 10/06/2022       Reactions   Calcium-vitamin D [calcium Carb-ergocalciferol] Other (See Comments)   Constipation    Tape Other (See Comments)   Skin very thin, tears easily   Codeine Nausea Only   Hydrocodone Nausea Only   Metoprolol Nausea Only   Rythmol [propafenone Hcl] Other (See Comments)   Dizziness    Shellfish Allergy Itching, Rash   Mussels only.   Sulfa Antibiotics Nausea Only        Medication List     STOP taking these medications    glimepiride 1 MG tablet Commonly known as: AMARYL   lisinopril 20 MG tablet Commonly  known as: ZESTRIL       TAKE these medications    acetaminophen 500 MG tablet Commonly known as: TYLENOL Take 1,000 mg by mouth every 6 (six) hours as needed (for pain).   Arnicare Arnica Crea Apply 1 application  topically 2 (two) times daily as needed (leg pains).   Benefiber Powd Take 1 Scoop by mouth daily.   docusate 50 MG/5ML liquid Commonly known as: COLACE Take 10 mLs  (100 mg total) by mouth 2 (two) times daily.   ferrous sulfate 325 (65 FE) MG EC tablet Take 1 tablet (325 mg total) by mouth 2 (two) times daily.   FISH OIL PO Take 1 capsule by mouth every evening.   furosemide 40 MG tablet Commonly known as: Lasix Take 1 tablet (40 mg total) by mouth daily as needed for fluid or edema.   Glycerin (Adult) 2 g Supp Place 1 suppository rectally daily as needed for moderate constipation.   levothyroxine 88 MCG tablet Commonly known as: SYNTHROID Take 88 mcg by mouth daily before breakfast.   loratadine 10 MG tablet Commonly known as: CLARITIN Take 10 mg by mouth daily. [employer] brand - Allerclear   MAGNESIUM OXIDE PO Take 1 tablet by mouth every evening.   meclizine 25 MG tablet Commonly known as: ANTIVERT Take one every 6-8 hours as needed for dizziness What changed:  how much to take how to take this when to take this reasons to take this additional instructions   oxyCODONE-acetaminophen 5-325 MG tablet Commonly known as: Percocet Take 1-2 tablets by mouth 2 (two) times daily as needed for severe pain.   PRESERVISION AREDS 2 PO Take 1 capsule by mouth 2 (two) times daily.   PROBIOTIC DAILY PO Take 2 capsules by mouth daily as needed (digestion).   rOPINIRole 0.25 MG tablet Commonly known as: REQUIP Take 0.25 mg by mouth 2 (two) times daily.   rosuvastatin 5 MG tablet Commonly known as: CRESTOR Take 1 tablet (5 mg total) by mouth daily.   sodium chloride 0.65 % nasal spray Commonly known as: OCEAN Place 1 spray into the nose daily as needed for congestion.   VITAMIN B-12 PO Take 1 tablet by mouth daily.   VITAMIN B-3 PO Take 1 tablet by mouth daily.   VITAMIN D-3 PO Take 1 capsule by mouth daily.   Xarelto 15 MG Tabs tablet Generic drug: Rivaroxaban TAKE 1 TABLET(15 MG) BY MOUTH DAILY WITH SUPPER What changed: See the new instructions.               Discharge Care Instructions  (From admission, onward)            Start     Ordered   10/01/22 0000  Weight bearing as tolerated        10/01/22 1417            Follow-up Information     Tarry Kos, MD Follow up in 2 week(s).   Specialty: Orthopedic Surgery Why: For suture removal, For wound re-check Contact information: [address] [place] [address] [phone]         Lupita Raider, MD Follow up.   Specialty: Family Medicine Contact information: [address] [place] [address] [phone]         [facility] Follow up in 1 month(s).   Contact information: [address]     [address] [place] [address] [phone]  Allergies  Allergen Reactions   Calcium-Vitamin D [Calcium Carb-Ergocalciferol] Other (See Comments)    Constipation    Tape Other (See Comments)    Skin very thin, tears easily   Codeine Nausea Only   Hydrocodone Nausea Only   Metoprolol Nausea Only   Rythmol [Propafenone Hcl] Other (See Comments)    Dizziness    Shellfish Allergy Itching and Rash    Mussels only.   Sulfa Antibiotics Nausea Only    Consultations: Ortho Neuro    Procedures/Studies: VAS US CAROTID  Result Date: 10/04/2022 Carotid Arterial Duplex Study Patient Name:  ELENI HLAVACEK  Date of Exam:   10/03/2022 Medical Rec #: [id number]           Accession #:    [id number] Date of Birth: [birth year]           Patient Gender: F Patient Age:   89 years Exam Location:  [facility] Procedure:      VAS US CAROTID Referring Phys: Gevena Mart --------------------------------------------------------------------------------  Indications:       CVA. Risk Factors:      Hypertension, Diabetes. Comparison Study:  01/01/2019 - Right Carotid: The extracranial vessels were                    near-normal with only minimal                    wall                    thickening or plaque.                     Left Carotid: The extracranial  vessels were near-normal with                    only minimal                    wall                    thickening or plaque.                     Vertebrals: Bilateral vertebral arteries demonstrate                    antegrade flow.                    Subclavians: Normal flow hemodynamics were seen in bilateral                    subclavian                    arteries. Performing Technologist: Chanda Busing RVT  Examination Guidelines: A complete evaluation includes B-mode imaging, spectral Doppler, color Doppler, and power Doppler as needed of all accessible portions of each vessel. Bilateral testing is considered an integral part of a complete examination. Limited examinations for reoccurring indications may be performed as noted.  Right Carotid Findings: +----------+--------+--------+--------+-----------------------+--------+           PSV cm/sEDV cm/sStenosisPlaque Description     Comments +----------+--------+--------+--------+-----------------------+--------+ CCA Prox  96      10              smooth and heterogenous         +----------+--------+--------+--------+-----------------------+--------+ CCA Distal96      13  smooth and heterogenous         +----------+--------+--------+--------+-----------------------+--------+ ICA Prox  69      17              smooth and heterogenous         +----------+--------+--------+--------+-----------------------+--------+ ICA Mid   103     27              smooth and heterogenous         +----------+--------+--------+--------+-----------------------+--------+ ICA Distal104     21                                     tortuous +----------+--------+--------+--------+-----------------------+--------+ ECA       104     0                                               +----------+--------+--------+--------+-----------------------+--------+ +----------+--------+-------+--------+-------------------+           PSV  cm/sEDV cmsDescribeArm Pressure (mmHG) +----------+--------+-------+--------+-------------------+ [id number]                                        +----------+--------+-------+--------+-------------------+ +---------+--------+--+--------+--+---------+ VertebralPSV cm/s57EDV cm/s12Antegrade +---------+--------+--+--------+--+---------+  Left Carotid Findings: +----------+--------+--------+--------+-----------------------+--------+           PSV cm/sEDV cm/sStenosisPlaque Description     Comments +----------+--------+--------+--------+-----------------------+--------+ CCA Prox  140     13              smooth and heterogenous         +----------+--------+--------+--------+-----------------------+--------+ CCA Distal100     13              smooth and heterogenous         +----------+--------+--------+--------+-----------------------+--------+ ICA Prox  93      20                                              +----------+--------+--------+--------+-----------------------+--------+ ICA Mid   91      23              smooth and heterogenous         +----------+--------+--------+--------+-----------------------+--------+ ICA Distal125     29                                     tortuous +----------+--------+--------+--------+-----------------------+--------+ ECA       101     0                                               +----------+--------+--------+--------+-----------------------+--------+ +----------+--------+--------+--------+-------------------+           PSV cm/sEDV cm/sDescribeArm Pressure (mmHG) +----------+--------+--------+--------+-------------------+ [id number]                                         +----------+--------+--------+--------+-------------------+ +---------+--------+--+--------+--+---------+ VertebralPSV  cm/s56EDV cm/s13Antegrade +---------+--------+--+--------+--+---------+   Summary: Right Carotid:  Velocities in the right ICA are consistent with a 1-39% stenosis. Left Carotid: Velocities in the left ICA are consistent with a 1-39% stenosis. Vertebrals: Bilateral vertebral arteries demonstrate antegrade flow. *See table(s) above for measurements and observations.  Electronically signed by Gerarda Fraction on 10/04/2022 at 8:51:06 AM.    Final    MR ANGIO HEAD WO CONTRAST  Result Date: 10/04/2022 CLINICAL DATA:  Follow-up examination for stroke. EXAM: MRA HEAD WITHOUT CONTRAST TECHNIQUE: Angiographic images of the Circle of Willis were acquired using MRA technique without intravenous contrast. COMPARISON:  Prior MRI from 10/02/2022. FINDINGS: Anterior circulation: Examination moderately to severely degraded by motion artifact. Both internal carotid arteries are patent through the siphons without visible stenosis or other abnormality. A1 segments patent. Grossly normal anterior communicating complex. Anterior cerebral arteries patent without visible stenosis. No M1 stenosis or occlusion. No visible proximal MCA branch occlusion or high-grade stenosis. Distal MCA branches grossly perfused and symmetric. Posterior circulation: Visualized V4 segments patent without stenosis. Left PICA grossly patent at its origin. Right PICA origin not well seen on this motion degraded exam. Basilar patent without stenosis. Superior cerebral arteries patent bilaterally. Both PCAs primarily supplied via the basilar. Suspected atheromatous irregularity within both PCAs which remain patent to their distal aspects. Evaluation for potential stenosis fairly limited by motion. Anatomic variants: None significant. Other: No visible aneurysm. IMPRESSION: 1. Technically limited exam due to extensive motion artifact. 2. Negative intracranial MRA for large vessel occlusion. Please note that evaluation for potential stenoses is fairly limited given motion artifact. Electronically Signed   By: Rise Mu M.D.   On: 10/04/2022 04:10    ECHOCARDIOGRAM COMPLETE  Result Date: 10/03/2022    ECHOCARDIOGRAM REPORT   Patient Name:   AYVIANA CANTO Date of Exam: 10/03/2022 Medical Rec #:  [id number]          Height:       63.0 in Accession #:    [id number]         Weight:       151.9 lb Date of Birth:  1919-01-31          BSA:          1.720 m Patient Age:    89 years          BP:           120/37 mmHg Patient Gender: F                  HR:           91 bpm. Exam Location:  Inpatient Procedure: 2D Echo, Cardiac Doppler and Color Doppler Indications:    stroke  History:        Patient has prior history of Echocardiogram examinations, most                 recent 03/18/2022. Arrythmias:Atrial Fibrillation; Risk                 Factors:Diabetes and Hypertension.  Sonographer:    Delcie Roch RDCS Referring Phys: [id number] Kobie Matkins IMPRESSIONS  1. Left ventricular ejection fraction, by estimation, is 60 to 65%. The left ventricle has normal function. The left ventricle has no regional wall motion abnormalities. There is mild concentric left ventricular hypertrophy. Left ventricular diastolic function could not be evaluated.  2. Right ventricular systolic function is mildly reduced. The right ventricular size is normal. There is severely elevated pulmonary artery systolic pressure.  3. Left atrial size was moderately dilated.  4. Right atrial size was severely dilated.  5. The mitral valve is degenerative. Trivial mitral valve regurgitation. No evidence of mitral stenosis.  6. Tricuspid valve regurgitation is severe.  7. The aortic valve is tricuspid. Aortic valve regurgitation is not visualized. No aortic stenosis is present.  8. The inferior vena cava is normal in size with greater than 50% respiratory variability, suggesting right atrial pressure of 3 mmHg. FINDINGS  Left Ventricle: Left ventricular ejection fraction, by estimation, is 60 to 65%. The left ventricle has normal function. The left ventricle has no regional wall motion  abnormalities. The left ventricular internal cavity size was normal in size. There is  mild concentric left ventricular hypertrophy. Left ventricular diastolic function could not be evaluated due to atrial fibrillation. Left ventricular diastolic function could not be evaluated. Right Ventricle: The right ventricular size is normal. No increase in right ventricular wall thickness. Right ventricular systolic function is mildly reduced. There is severely elevated pulmonary artery systolic pressure. The tricuspid regurgitant velocity is 3.98 m/s, and with an assumed right atrial pressure of 5 mmHg, the estimated right ventricular systolic pressure is 68.4 mmHg. Left Atrium: Left atrial size was moderately dilated. Right Atrium: Right atrial size was severely dilated. Pericardium: Trivial pericardial effusion is present. The pericardial effusion is posterior to the left ventricle. Mitral Valve: The mitral valve is degenerative in appearance. Trivial mitral valve regurgitation. No evidence of mitral valve stenosis. Tricuspid Valve: The tricuspid valve is normal in structure. Tricuspid valve regurgitation is severe. No evidence of tricuspid stenosis. Aortic Valve: The aortic valve is tricuspid. Aortic valve regurgitation is not visualized. No aortic stenosis is present. Pulmonic Valve: The pulmonic valve was normal in structure. Pulmonic valve regurgitation is trivial. No evidence of pulmonic stenosis. Aorta: The aortic root is normal in size and structure. Venous: The inferior vena cava is normal in size with greater than 50% respiratory variability, suggesting right atrial pressure of 3 mmHg. IAS/Shunts: No atrial level shunt detected by color flow Doppler.  LEFT VENTRICLE PLAX 2D LVIDd:         3.80 cm LVIDs:         2.70 cm LV PW:         1.30 cm LV IVS:        1.00 cm LVOT diam:     1.80 cm LV SV:         40 LV SV Index:   23 LVOT Area:     2.54 cm  RIGHT VENTRICLE          IVC RV Basal diam:  2.90 cm  IVC diam:  1.70 cm TAPSE (M-mode): 0.9 cm LEFT ATRIUM             Index        RIGHT ATRIUM           Index LA diam:        3.90 cm 2.27 cm/m   RA Area:     23.90 cm LA Vol (A2C):   51.8 ml 30.11 ml/m  RA Volume:   80.20 ml  46.62 ml/m LA Vol (A4C):   60.7 ml 35.29 ml/m LA Biplane Vol: 59.2 ml 34.41 ml/m  AORTIC VALVE LVOT Vmax:   109.00 cm/s LVOT Vmean:  67.700 cm/s LVOT VTI:    0.158 m  AORTA Ao Root diam: 2.90 cm Ao Asc diam:  2.90 cm TRICUSPID VALVE TR Peak grad:   63.4 mmHg TR  Vmax:        398.00 cm/s  SHUNTS Systemic VTI:  0.16 m Systemic Diam: 1.80 cm Arvilla Meres MD Electronically signed by Arvilla Meres MD Signature Date/Time: 10/03/2022/6:07:58 PM    Final    US RENAL  Result Date: 10/03/2022 CLINICAL DATA:  [id number] AKI (acute kidney injury) (HCC) [id number] EXAM: RENAL / URINARY TRACT ULTRASOUND COMPLETE COMPARISON:  06/13/2009 FINDINGS: Right Kidney: Renal measurements: 8.3 x 3.8 x 4.6 cm = volume: 76.5 mL. No hydronephrosis. Increased renal cortical echogenicity. Left Kidney: Renal measurements: 9.7 x 3.9 x 4.5 cm = volume: 89.0 mL. No hydronephrosis. Increased renal cortical echogenicity. Bladder: Not visualized, presumably decompressed Other: None. IMPRESSION: No hydronephrosis. Increased renal cortical echogenicity bilaterally, as can be seen in medical renal disease. Electronically Signed   By: Caprice Renshaw M.D.   On: 10/03/2022 12:57   DG Swallowing Func-Speech Pathology  Result Date: 10/03/2022 Table formatting from the original result was not included. Modified Barium Swallow Study Patient Details Name: YENESIS PAWELEK MRN: [id number] Date of Birth: 1918/11/07 Today's Date: 10/03/2022 HPI/PMH: HPI: Pt is a 89 year old female admitted after a fall and subsequent pain preventing ambulation. Patient was found to have a left hip fracture. Underwnt surgery on 5/15. Reported feeling of food and liquid in throat. Family reports this has been ongoing for a while. She is afraid of choking. Found to  ahve had acute CVA perioperatively. MRI shows Acute lacunar infarct in the bilateral corona radiata. Clinical Impression: Pt participatory, but very hard of hearing and a little lethargic this am. Pt able to follow simple commands, but had difficulty staying attentive to follow swallow strategies or comprehend fully though cognition typically WNL. Pt demonstrates a mild oropharyngeal dysphagia with mild anterior spillage due to left facial nerve weakness and mild oral residue likely due to decreased lingual tension.  There were instance of dleayed swallow initaition and penetration before the swallow that was ejected with good vestbublar closure. Trace aspiration after the swallow did occur due to moderate resiude in the pyriform sinuses. Pt was able to sense and clear with a cough and cleared oral and pharyngeal residue with a second swallow. Pts severe and prolonged coughing however was mostly due to stasis of puree in the proximal esophagus. Pt repeatedly coughing and hacking well after airway clear due to primary esophageal dysphagia. Liquid wash very helpful.  Did not attempt mastication of solids due to xerostomia and no dentition at the time of exam as well as severe esophageal retention and prolonged coughing and globus as a result. Typically pt attempts a mechanical soft texture at home and goes out to eat often with family. Given increased risk of aspiration due to mobility issues after hip fx and pain medication in setting of chronic esophageal dysphagia and mild oropharyngeal dysphagia, will maintain a restricitve diet of puree and nectar thick liquids with aspiration precautions and reflux precautions. Hopeful for upgrade during rehabilitation process. Factors that may increase risk of adverse event in presence of aspiration Rubye Oaks & Clearance Coots 2021): Factors that may increase risk of adverse event in presence of aspiration Rubye Oaks & Clearance Coots 2021): Limited mobility; Aspiration of thick, dense, and/or  acidic materials Recommendations/Plan: Swallowing Evaluation Recommendations Swallowing Evaluation Recommendations Recommendations: PO diet PO Diet Recommendation: Dysphagia 1 (Pureed); Mildly thick liquids (Level 2, nectar thick) Liquid Administration via: Cup; Straw Medication Administration: Crushed with puree Supervision: Full supervision/cueing for swallowing strategies Swallowing strategies  : Slow rate; Small bites/sips; Follow solids with liquids; Minimize environmental distractions  Postural changes: Position pt fully upright for meals; Stay upright 30-60 min after meals Oral care recommendations: Oral care BID (2x/day) Treatment Plan Treatment Plan Treatment recommendations: Therapy as outlined in treatment plan below Follow-up recommendations: Acute inpatient rehab (3 hours/day) Functional status assessment: Patient has had a recent decline in their functional status and demonstrates the ability to make significant improvements in function in a reasonable and predictable amount of time. Treatment frequency: Min 2x/week Treatment duration: 2 weeks Interventions: Aspiration precaution training; Compensatory techniques; Patient/family education; Trials of upgraded texture/liquids; Diet toleration management by SLP Recommendations Recommendations for follow up therapy are one component of a multi-disciplinary discharge planning process, led by the attending physician.  Recommendations may be updated based on patient status, additional functional criteria and insurance authorization. Assessment: Orofacial Exam: Orofacial Exam Oral Cavity: Oral Hygiene: Xerostomia Oral Cavity - Dentition: Edentulous Anatomy: No data recorded Boluses Administered: Boluses Administered Boluses Administered: Mildly thick liquids (Level 2, nectar thick); Thin liquids (Level 0); Puree; Solid  Oral Impairment Domain: Oral Impairment Domain Lip Closure: Escape beyond mid-chin Tongue control during bolus hold: Escape to lateral buccal  cavity/floor of mouth Bolus preparation/mastication: -- (solids NT) Oral residue: Residue collection on oral structures Location of oral residue : Tongue; Palate Initiation of pharyngeal swallow : Pyriform sinuses  Pharyngeal Impairment Domain: Pharyngeal Impairment Domain Soft palate elevation: No bolus between soft palate (SP)/pharyngeal wall (PW) Laryngeal elevation: Complete superior movement of thyroid cartilage with complete approximation of arytenoids to epiglottic petiole Anterior hyoid excursion: Complete anterior movement Epiglottic movement: Complete inversion Laryngeal vestibule closure: Complete, no air/contrast in laryngeal vestibule Pharyngeal stripping wave : Present - complete Pharyngoesophageal segment opening: Complete distension and complete duration, no obstruction of flow Tongue base retraction: Trace column of contrast or air between tongue base and PPW Pharyngeal residue: Collection of residue within or on pharyngeal structures Location of pharyngeal residue: Valleculae; Pyriform sinuses  Esophageal Impairment Domain: Esophageal Impairment Domain Esophageal clearance upright position: Esophageal retention with retrograde flow below pharyngoesophageal segment (PES); Esophageal retention Pill: Esophageal Impairment Domain Esophageal clearance upright position: Esophageal retention with retrograde flow below pharyngoesophageal segment (PES); Esophageal retention Penetration/Aspiration Scale Score: No data recorded Compensatory Strategies: Compensatory Strategies Compensatory strategies: No   General Information: Caregiver present: No  Diet Prior to this Study: Dysphagia 1 (pureed); Mildly thick liquids (Level 2, nectar thick)   Temperature : Normal   No data recorded  Supplemental O2: None (Room air)   History of Recent Intubation: Yes  Behavior/Cognition: Alert; Cooperative; Pleasant mood (hard of hearing) Self-Feeding Abilities: Able to self-feed Baseline vocal quality/speech: Normal  Volitional Cough: Able to elicit Volitional Swallow: Able to elicit No data recorded Goal Planning: Prognosis for improved oropharyngeal function: Good No data recorded No data recorded Patient/Family Stated Goal: rehab No data recorded Pain: Pain Assessment Pain Assessment: 0-10 Pain Score: 10 Pain Location: L hip Pain Descriptors / Indicators: Discomfort; Grimacing; Guarding Pain Intervention(s): Monitored during session; Limited activity within patient's tolerance; Repositioned End of Session: Start Time:SLP Start Time (ACUTE ONLY): 2440 Stop Time: SLP Stop Time (ACUTE ONLY): 0940 Time Calculation:SLP Time Calculation (min) (ACUTE ONLY): 15 min Charges: SLP Evaluations $ SLP Speech Visit: 1 Visit SLP Evaluations $BSS Swallow: 1 Procedure $MBS Swallow: 1 Procedure $Swallowing Treatment: 1 Procedure SLP visit diagnosis: SLP Visit Diagnosis: Dysphagia, oropharyngeal phase (R13.12) Past Medical History: Past Medical History: Diagnosis Date  Carpal tunnel syndrome   Chronic anticoagulation   Chronic atrial fibrillation (HCC)   Diabetes mellitus   NON-INSULIN  Hypothyroidism  Sinus congestion  Past Surgical History: Past Surgical History: Procedure Laterality Date  APPENDECTOMY    CARDIOVASCULAR STRESS TEST  07/10/2004  EF 81%  CHOLECYSTECTOMY    INTRAMEDULLARY (IM) NAIL INTERTROCHANTERIC Left 10/01/2022  Procedure: INTRAMEDULLARY (IM) NAIL INTERTROCHANTERIC;  Surgeon: Tarry Kos, MD;  Location: [facility] OR;  Service: Orthopedics;  Laterality: Left;  US ECHOCARDIOGRAPHY  12/06/2007  EF 55-60% DeBlois, Riley Nearing 10/03/2022, 12:26 PM  MR BRAIN WO CONTRAST  Result Date: 10/02/2022 CLINICAL DATA:  Neuro deficit with acute stroke suspected EXAM: MRI HEAD WITHOUT CONTRAST TECHNIQUE: Multiplanar, multiecho pulse sequences of the brain and surrounding structures were obtained without intravenous contrast. COMPARISON:  Head CT from 3 days ago FINDINGS: Brain: Small acute infarcts in the bilateral corona radiata. Although  that bilateral and somewhat symmetric in size and location these are not at the basal ganglia to imply a global/toxic cause. There is chronic small vessel ischemia in the cerebral white matter to a mild degree for age. No acute hemorrhage, hydrocephalus, or collection. Cerebral volume loss that is mild for age. Few chronic microhemorrhages mainly in the deep brain , likely microvascular ischemic. Vascular: Normal flow voids. Skull and upper cervical spine: Normal marrow signal. Sinuses/Orbits: Negative. IMPRESSION: 1. Acute lacunar infarct in the bilateral corona radiata. 2. Generalized atrophy and chronic small vessel ischemia. Electronically Signed   By: Tiburcio Pea M.D.   On: 10/02/2022 19:27   DG FEMUR MIN 2 VIEWS LEFT  Result Date: 10/01/2022 CLINICAL DATA:  [id number] Surgery, elective [id number] EXAM: LEFT FEMUR 2 VIEWS COMPARISON:  09/29/2022 FINDINGS: Intraoperative images during left femur intramedullary nailing for an intertrochanteric fracture. Improved fracture alignment. Intact hardware without evidence of loosening. IMPRESSION: Intraoperative images during left femur ORIF. No evidence of immediate hardware complication. Improved intertrochanteric fracture alignment. Electronically Signed   By: Caprice Renshaw M.D.   On: 10/01/2022 16:14   DG C-Arm 1-60 Min-No Report  Result Date: 10/01/2022 Fluoroscopy was utilized by the requesting physician.  No radiographic interpretation.   DG Ankle Complete Right  Result Date: 09/29/2022 CLINICAL DATA:  Tripped and fell going down 1 step landing on right side. EXAM: RIGHT ANKLE - COMPLETE 3+ VIEW COMPARISON:  None Available. FINDINGS: Ankle mortise is normal. Findings suggested a possible fracture of the medial malleolus seen only on the frontal film. Small inferior calcaneal spur. Minimal degenerative change of the midfoot. IMPRESSION: Possible fracture of the medial malleolus seen only on the frontal film. Electronically Signed   By: Elberta Fortis M.D.    On: 09/29/2022 13:15   DG Hip Unilat With Pelvis 2-3 Views Left  Result Date: 09/29/2022 CLINICAL DATA:  Tripped and fell over 1 steps landing on right side. Left lower extremity deformity. EXAM: DG HIP (WITH OR WITHOUT PELVIS) 2-3V LEFT COMPARISON:  None Available. FINDINGS: Mild diffuse decreased bone mineralization. Minimal symmetric degenerative changes of the hips. There is a displaced intertrochanteric fracture of the left hip. Minimally displaced lesser trochanteric fragment. Minimal exaggerated coxa vera about the fracture site. Degenerative changes of the spine. IMPRESSION: Displaced intertrochanteric fracture of the left hip. Electronically Signed   By: Elberta Fortis M.D.   On: 09/29/2022 13:12   DG Chest Port 1 View  Result Date: 09/29/2022 CLINICAL DATA:  Tripped and fell over one-step landing on right side. EXAM: PORTABLE CHEST 1 VIEW COMPARISON:  03/20/2022 FINDINGS: Lungs are hyperexpanded and otherwise clear. Mild stable cardiomegaly. No evidence of acute fracture. IMPRESSION: 1. No acute findings. 2. Mild stable cardiomegaly. Electronically Signed  By: Elberta Fortis M.D.   On: 09/29/2022 13:10   CT HEAD WO CONTRAST  Result Date: 09/29/2022 CLINICAL DATA:  Head trauma, minor EXAM: CT HEAD WITHOUT CONTRAST TECHNIQUE: Contiguous axial images were obtained from the base of the skull through the vertex without intravenous contrast. RADIATION DOSE REDUCTION: This exam was performed according to the departmental dose-optimization program which includes automated exposure control, adjustment of the mA and/or kV according to patient size and/or use of iterative reconstruction technique. COMPARISON:  CT head dated May 16, 2020 FINDINGS: Brain: No evidence of acute infarction, hemorrhage, hydrocephalus, extra-axial collection or mass lesion/mass effect. Patchy areas of low-attenuation of the periventricular and subcortical white matter presumed moderate chronic microvascular ischemic changes.  Mild generalized cerebral atrophy. Vascular: No hyperdense vessel or unexpected calcification. Skull: Normal. Negative for fracture or focal lesion. Sinuses/Orbits: No acute finding. Other: None. IMPRESSION: 1. No acute intracranial abnormality. 2. Moderate chronic microvascular ischemic changes of the white matter. Mild generalized cerebral atrophy. Electronically Signed   By: Larose Hires D.O.   On: 09/29/2022 12:21       Discharge Exam: Vitals:   10/06/22 0728 10/06/22 0848  BP: (!) 132/58 117/64  Pulse: 78 75  Resp: 16 17  Temp: 98.2 F (36.8 C) 98.2 F (36.8 C)  SpO2: 100%     General: Pt is alert, awake, not in acute distress Cardiovascular: Irregular rhythm, rate 60-70, S1/S2 +, no edema Respiratory: CTA bilaterally, no wheezing, no rhonchi, no respiratory distress Abdominal: Soft, NT, ND, bowel sounds + Extremities: no edema, no cyanosis CNS: Alert, remains dysarthric Psych: Normal mood and affect, stable judgement and insight     The results of significant diagnostics from this hospitalization (including imaging, microbiology, ancillary and laboratory) are listed below for reference.     Microbiology: Recent Results (from the past 240 hour(s))  Surgical pcr screen     Status: None   Collection Time: 09/30/22  5:40 PM   Specimen: Nasal Mucosa; Nasal Swab  Result Value Ref Range Status   MRSA, PCR NEGATIVE NEGATIVE Final   Staphylococcus aureus NEGATIVE NEGATIVE Final    Comment: (NOTE) The Xpert SA Assay (FDA approved for NASAL specimens in patients 63 years of age and older), is one component of a comprehensive surveillance program. It is not intended to diagnose infection nor to guide or monitor treatment. Performed at [facility], [address] [place], [place] [address]      Labs: BNP (last 3 results) Recent Labs    03/17/22 1248  BNP 273.4*   Basic Metabolic Panel: Recent Labs  Lab 10/02/22 0812 10/02/22 1317 10/03/22 0851  10/04/22 0241 10/05/22 0705 10/05/22 1010 10/06/22 0125  NA 130*  --  131* 132* 135  --  134*  K 5.5*   < > 4.9 4.9 5.3* 5.3* 4.5  CL 97*  --  102 103 108  --  109  CO2 23  --  22 20* 15*  --  19*  GLUCOSE 231*  --  183* 172* 165*  --  187*  BUN 40*  --  45* 47* 46*  --  49*  CREATININE 1.40*  --  1.39* 1.28* 1.05*  --  1.10*  CALCIUM 8.1*  --  7.7* 7.9* 7.6*  --  7.3*   < > = values in this interval not displayed.   Liver Function Tests: No results for input(s): "AST", "ALT", "ALKPHOS", "BILITOT", "PROT", "ALBUMIN" in the last 168 hours. No results for input(s): "LIPASE", "  AMYLASE" in the last 168 hours. No results for input(s): "AMMONIA" in the last 168 hours. CBC: Recent Labs  Lab 09/29/22 1135 10/01/22 0625 10/02/22 0128 10/03/22 0851 10/04/22 0241 10/06/22 0125  WBC 9.2 14.8* 13.5* 11.3* 12.1* 10.2  NEUTROABS 7.0  --   --   --   --   --   HGB 11.2* 8.2* 8.9* 7.5* 7.4* 6.5*  HCT 35.7* 25.2* 27.1* 24.0* 23.2* 20.8*  MCV 103.2* 98.1 96.1 101.7* 100.4* 102.0*  PLT 190 129* 127* 120* 134* 183   Cardiac Enzymes: No results for input(s): "CKTOTAL", "CKMB", "CKMBINDEX", "TROPONINI" in the last 168 hours. BNP: Invalid input(s): "POCBNP" CBG: Recent Labs  Lab 10/05/22 0751 10/05/22 1121 10/05/22 1612 10/05/22 2140 10/06/22 0727  GLUCAP 144* 170* 154* 165* 163*   D-Dimer No results for input(s): "DDIMER" in the last 72 hours. Hgb A1c No results for input(s): "HGBA1C" in the last 72 hours. Lipid Profile Recent Labs    10/04/22 0241  CHOL 134  HDL 48  LDLCALC 68  TRIG 91  CHOLHDL 2.8   Thyroid function studies No results for input(s): "TSH", "T4TOTAL", "T3FREE", "THYROIDAB" in the last 72 hours.  Invalid input(s): "FREET3" Anemia work up No results for input(s): "VITAMINB12", "FOLATE", "FERRITIN", "TIBC", "IRON", "RETICCTPCT" in the last 72 hours. Urinalysis    Component Value Date/Time   COLORURINE YELLOW 03/17/2022 1700   APPEARANCEUR CLEAR  03/17/2022 1700   LABSPEC 1.008 03/17/2022 1700   PHURINE 7.0 03/17/2022 1700   GLUCOSEU NEGATIVE 03/17/2022 1700   HGBUR NEGATIVE 03/17/2022 1700   BILIRUBINUR NEGATIVE 03/17/2022 1700   KETONESUR NEGATIVE 03/17/2022 1700   PROTEINUR NEGATIVE 03/17/2022 1700   UROBILINOGEN 0.2 05/23/2014 2023   NITRITE NEGATIVE 03/17/2022 1700   LEUKOCYTESUR NEGATIVE 03/17/2022 1700   Sepsis Labs Recent Labs  Lab 10/02/22 0128 10/03/22 0851 10/04/22 0241 10/06/22 0125  WBC 13.5* 11.3* 12.1* 10.2   Microbiology Recent Results (from the past 240 hour(s))  Surgical pcr screen     Status: None   Collection Time: 09/30/22  5:40 PM   Specimen: Nasal Mucosa; Nasal Swab  Result Value Ref Range Status   MRSA, PCR NEGATIVE NEGATIVE Final   Staphylococcus aureus NEGATIVE NEGATIVE Final    Comment: (NOTE) The Xpert SA Assay (FDA approved for NASAL specimens in patients 68 years of age and older), is one component of a comprehensive surveillance program. It is not intended to diagnose infection nor to guide or monitor treatment. Performed at [facility], [address], [place], [place] [address]      Patient was seen and examined on the day of discharge and was found to be in stable condition. Time coordinating discharge: 35 minutes including assessment and coordination of care, as well as examination of the patient.   SIGNED:  Noralee Stain, DO [facility] 10/06/2022, 11:10 AM

## 2022-10-06 NOTE — H&P (Addendum)
Physical Medicine and Rehabilitation Admission H&P        Chief Complaint  Patient presents with   Fall  : HPI: Amanda [name] with history of atrial fibrillation maintained on Xarelto, diabetes mellitus, hypothyroidism.  Per chart review patient lives alone.  Split-level home, 3 stairs up/down stairs with 1 side railing.  Used a rollator and a cane in the home..  Presented 09/29/2022 after mechanical fall when she missed 1 step landing on her left hip.  No loss of consciousness.  Cranial CT scan negative for acute changes.  X-rays and imaging revealed displaced intertrochanteric fracture of the left hip.  Admission chemistries unremarkable except sodium 132, glucose 246, hemoglobin 11.2.  Underwent open treatment of intertrochanteric hip fracture with intramedullary implant 10/01/2022 per Dr. Roda Shutters.  Weightbearing as tolerated left lower extremity.  Postoperative slurred speech and left-sided facial droop.  MRI of the brain showed acute lacunar infarct in the bilateral corona radiata.  MRA negative for large vessel occlusion.  Carotid Dopplers with no ICA stenosis.  Echocardiogram with ejection fraction of 60 to 65% no wall motion abnormalities.  Neurology follow-up patient was cleared to resume Xarelto as prior to admission.  She is currently maintained dysphagia #1 nectar thick liquid diet.  Hospital course acute blood loss anemia 6.5 orders for transfusion 10/06/2022 of 1 unit packed red blood cells..  Therapy evaluations completed due to patient decreased functional mobility was admitted for a comprehensive rehab program.   Review of Systems  Constitutional:  Negative for chills and fever.  HENT:  Positive for hearing loss.   Eyes:  Negative for blurred vision and double vision.  Respiratory:  Negative for cough, shortness of breath and wheezing.   Cardiovascular:  Positive for palpitations. Negative for chest pain and leg swelling.  Gastrointestinal:  Positive for constipation. Negative for  heartburn, nausea and vomiting.  Genitourinary:  Negative for dysuria, flank pain and hematuria.  Musculoskeletal:  Positive for falls, joint pain and myalgias.  Skin:  Negative for rash.  Neurological:  Positive for speech change and weakness.  Psychiatric/Behavioral:  The patient is nervous/anxious.   All other systems reviewed and are negative.       Past Medical History:  Diagnosis Date   Carpal tunnel syndrome     Chronic anticoagulation     Chronic atrial fibrillation (HCC)     Diabetes mellitus      NON-INSULIN   Hypothyroidism     Sinus congestion           Past Surgical History:  Procedure Laterality Date   APPENDECTOMY       CARDIOVASCULAR STRESS TEST   07/10/2004    EF 81%   CHOLECYSTECTOMY       INTRAMEDULLARY (IM) NAIL INTERTROCHANTERIC Left 10/01/2022    Procedure: INTRAMEDULLARY (IM) NAIL INTERTROCHANTERIC;  Surgeon: Tarry Kos, MD;  Location: [facility] OR;  Service: Orthopedics;  Laterality: Left;   US ECHOCARDIOGRAPHY   12/06/2007    EF 55-60%         Family History  Problem Relation Age of Onset   Stroke Mother     Hypertension Mother     Diabetes Sister     Hypertension Other     Heart attack Neg Hx      Social History:  reports that she has never smoked. She has never used smokeless tobacco. She reports that she does not drink alcohol and does not use drugs. Allergies:       Allergies  Allergen Reactions  Calcium-Vitamin D [Calcium Carb-Ergocalciferol] Other (See Comments)      Constipation    Tape Other (See Comments)      Skin very thin, tears easily   Codeine Nausea Only   Hydrocodone Nausea Only   Metoprolol Nausea Only   Rythmol [Propafenone Hcl] Other (See Comments)      Dizziness    Shellfish Allergy Itching and Rash      Mussels only.   Sulfa Antibiotics Nausea Only          Medications Prior to Admission  Medication Sig Dispense Refill   acetaminophen (TYLENOL) 500 MG tablet Take 1,000 mg by mouth every 6 (six) hours as needed (for  pain).       Cholecalciferol (VITAMIN D-3 PO) Take 1 capsule by mouth daily.       Cyanocobalamin (VITAMIN B-12 PO) Take 1 tablet by mouth daily.       ferrous sulfate 325 (65 FE) MG EC tablet Take 1 tablet (325 mg total) by mouth 2 (two) times daily. 60 tablet 0   furosemide (LASIX) 40 MG tablet Take 1 tablet (40 mg total) by mouth daily as needed for fluid or edema. 30 tablet 0   glimepiride (AMARYL) 1 MG tablet Take 1.5 mg by mouth daily with breakfast.        Homeopathic Products (ARNICARE ARNICA) CREA Apply 1 application  topically 2 (two) times daily as needed (leg pains).       levothyroxine (SYNTHROID, LEVOTHROID) 88 MCG tablet Take 88 mcg by mouth daily before breakfast.       lisinopril (ZESTRIL) 20 MG tablet Take 20 mg by mouth daily.       loratadine (CLARITIN) 10 MG tablet Take 10 mg by mouth daily. [employer] brand - Allerclear       MAGNESIUM OXIDE PO Take 1 tablet by mouth every evening.       meclizine (ANTIVERT) 25 MG tablet Take one every 6-8 hours as needed for dizziness (Patient taking differently: Take 25 mg by mouth daily as needed for dizziness.) 20 tablet 0   Multiple Vitamins-Minerals (PRESERVISION AREDS 2 PO) Take 1 capsule by mouth 2 (two) times daily.        Niacin (VITAMIN B-3 PO) Take 1 tablet by mouth daily.       Omega-3 Fatty Acids (FISH OIL PO) Take 1 capsule by mouth every evening.       Probiotic Product (PROBIOTIC DAILY PO) Take 2 capsules by mouth daily as needed (digestion).       rOPINIRole (REQUIP) 0.25 MG tablet Take 0.25 mg by mouth 2 (two) times daily.       sodium chloride (OCEAN) 0.65 % nasal spray Place 1 spray into the nose daily as needed for congestion.       XARELTO 15 MG TABS tablet TAKE 1 TABLET(15 MG) BY MOUTH DAILY WITH SUPPER (Patient taking differently: Take 15 mg by mouth daily with supper.) 30 tablet 5   Wheat Dextrin (BENEFIBER) POWD Take 1 Scoop by mouth daily.              Home: Home Living Family/patient expects to be discharged  to:: Private residence Living Arrangements: Alone Available Help at Discharge: Family, Available 24 hours/day Type of Home: House Home Access: Stairs to enter [employer] of Steps: 1 (3") + 4 Entrance Stairs-Rails: Left Home Layout:  (spit level) Alternate Level Stairs-Number of Steps: 1 step up, 1 step down Alternate Level Stairs-Rails: Right Bathroom Shower/Tub: Tub/shower unit, Walk-in shower  Bathroom Toilet: Pharmacist, community: Yes Home Equipment: Rollator (4 wheels), Cane - single point, BSC/3in1 Additional Comments: Pt lives alone in split level house, 3 stairs up/down stairs with one side railing. Pt uses rollator and cane in home.  Lives With: Alone   Functional History: Prior Function Prior Level of Function : Needs assist, History of Falls (last six months) Mobility Comments: Pt uses rollator at all times except with family takes her out in the community she will briefly use SPC to get from car to rollator. Pt with hx of a few falls in the past 6 months. ADLs Comments: Family provides pt with meals or pt fixes simple things, has an aide that assists with bathing 1x/week, pt dresses herself, pays for house cleaner 1x/month, pays all her bills.   Functional Status:  Mobility: Bed Mobility Overal bed mobility: Needs Assistance Bed Mobility: Rolling Supine to sit: Max assist General bed mobility comments: Performed rolling in chair to place blanket under her for lateral supine transfer.  Pt required total +2 for transfers.  Once in bed performed transfer rolling in bed to remove excess blankets and pads under patient. Transfers Overall transfer level:  (did not perform patient is very lethargic this pm.) Equipment used: Rolling walker (2 wheels) Transfers: Sit to/from Stand, Bed to chair/wheelchair/BSC Sit to Stand: Max assist Bed to/from chair/wheelchair/BSC transfer type:: Stand pivot Stand pivot transfers: Total assist Step pivot transfers: Max  assist, +2 physical assistance, From elevated surface General transfer comment: Sit - stand from EOB x 2 trials max A, total A to SPT to recliner. Lift pad under pt for nursing staff to safely return back to bed later Ambulation/Gait General Gait Details: unable   ADL: ADL Overall ADL's : Needs assistance/impaired Eating/Feeding: NPO Eating/Feeding Details (indicate cue type and reason): until speech eval due to feeling like food gets stuck when eating Grooming: Wash/dry hands, Wash/dry face, Min guard, Sitting Upper Body Bathing: Sitting, Moderate assistance Upper Body Bathing Details (indicate cue type and reason): poor sitting balance Lower Body Bathing: Total assistance, +2 for physical assistance, Sitting/lateral leans Upper Body Dressing : Moderate assistance, Sitting Lower Body Dressing: Total assistance, +2 for physical assistance, Sit to/from stand Toilet Transfer: Total assistance, Maximal assistance Toilet Transfer Details (indicate cue type and reason): simulated to recliner Toileting- Clothing Manipulation and Hygiene: Total assistance Functional mobility during ADLs: Maximal assistance, Total assistance General ADL Comments: Pt requires significant assistance with sitting on on EOB, LB ADLs, and transfers. Poor balance with standing, x2 for physical assistance and safety   Cognition: Cognition Overall Cognitive Status: Within Functional Limits for tasks assessed Arousal/Alertness: Lethargic (pt lethargic suspected due to both medications and pain) Orientation Level: Oriented X4 Year: 2022 Month: May Day of Week: Correct Attention: Focused Focused Attention: Impaired Focused Attention Impairment: Functional basic (suspected due to lethargy levels) Memory: Appears intact Awareness: Appears intact Problem Solving:  (unable to determine due to lethargy levels) Safety/Judgment:  (unable to determine due to lethargy levels) Cognition Arousal/Alertness:  Awake/alert Behavior During Therapy: WFL for tasks assessed/performed Overall Cognitive Status: Within Functional Limits for tasks assessed General Comments: Pt noted to have some slurred speech, family reports pt is not enunciating as well as prior to sx, no other focal deficits noted - MD & nurse made aware.   Physical Exam: Blood pressure 117/64, pulse 75, temperature 98.2 F (36.8 C), temperature source Oral, resp. rate 17, height 5\' 3"  (1.6 m), weight 68.9 kg, SpO2 100 %. Physical Exam Constitutional:  General: She is not in acute distress.    Comments: Slightly lethargic  HENT:     Head: Normocephalic and atraumatic.     Right Ear: External ear normal.     Left Ear: External ear normal.     Mouth/Throat:     Mouth: Mucous membranes are moist.  Eyes:     Conjunctiva/sclera: Conjunctivae normal.  Cardiovascular:     Rate and Rhythm: Normal rate and regular rhythm.     Heart sounds: No murmur heard.    No gallop.  Pulmonary:     Effort: Pulmonary effort is normal. No respiratory distress.  Abdominal:     General: Bowel sounds are normal. There is no distension.     Palpations: Abdomen is soft.     Tenderness: There is no abdominal tenderness.  Musculoskeletal:     Cervical back: Normal range of motion.  Skin:    General: Skin is warm.     Comments: Surgical hip incision clean and dry.  Neurological:     Comments: Patient is slightly lethargic but awakens enough to carry a conversation and follow basic commands. Fair insight and awareness. Able to provide me her street address. Left facial weakness and dysarthria present. Remaining CN exam normal. UE grossly 4/5 prox to distal, RLE 3+ to 4/5. LLE limited due to pain. Pt not completley cooperative with MMT. Sensation appears to be intact. No resting tone. DTR's tr.         Psychiatric:     Comments: Pt anxious. Sometimes closes down d/t anxiety.         Lab Results Last 48 Hours        Results for orders placed or  performed during the hospital encounter of 09/29/22 (from the past 48 hour(s))  Glucose, capillary     Status: Abnormal    Collection Time: 10/04/22 11:44 AM  Result Value Ref Range    Glucose-Capillary 154 (H) 70 - 99 mg/dL      Comment: Glucose reference range applies only to samples taken after fasting for at least 8 hours.  Glucose, capillary     Status: Abnormal    Collection Time: 10/04/22  4:48 PM  Result Value Ref Range    Glucose-Capillary 114 (H) 70 - 99 mg/dL      Comment: Glucose reference range applies only to samples taken after fasting for at least 8 hours.  Glucose, capillary     Status: Abnormal    Collection Time: 10/04/22  9:45 PM  Result Value Ref Range    Glucose-Capillary 112 (H) 70 - 99 mg/dL      Comment: Glucose reference range applies only to samples taken after fasting for at least 8 hours.  Basic metabolic panel     Status: Abnormal    Collection Time: 10/05/22  7:05 AM  Result Value Ref Range    Sodium 135 135 - 145 mmol/L    Potassium 5.3 (H) 3.5 - 5.1 mmol/L    Chloride 108 98 - 111 mmol/L    CO2 15 (L) 22 - 32 mmol/L    Glucose, Bld 165 (H) 70 - 99 mg/dL      Comment: Glucose reference range applies only to samples taken after fasting for at least 8 hours.    BUN 46 (H) 8 - 23 mg/dL    Creatinine, Ser 4.09 (H) 0.44 - 1.00 mg/dL    Calcium 7.6 (L) 8.9 - 10.3 mg/dL    GFR, Estimated 47 (L) >60 mL/min  Comment: (NOTE) Calculated using the CKD-EPI Creatinine Equation (2021)      Anion gap 12 5 - 15      Comment: Performed at [facility], [address], [place], [place] [address]  Glucose, capillary     Status: Abnormal    Collection Time: 10/05/22  7:51 AM  Result Value Ref Range    Glucose-Capillary 144 (H) 70 - 99 mg/dL      Comment: Glucose reference range applies only to samples taken after fasting for at least 8 hours.  Potassium     Status: Abnormal    Collection Time: 10/05/22 10:10 AM  Result Value Ref Range    Potassium 5.3  (H) 3.5 - 5.1 mmol/L      Comment: Performed at [facility], [address] [place], [place] [address]  Glucose, capillary     Status: Abnormal    Collection Time: 10/05/22 11:21 AM  Result Value Ref Range    Glucose-Capillary 170 (H) 70 - 99 mg/dL      Comment: Glucose reference range applies only to samples taken after fasting for at least 8 hours.  Glucose, capillary     Status: Abnormal    Collection Time: 10/05/22  4:12 PM  Result Value Ref Range    Glucose-Capillary 154 (H) 70 - 99 mg/dL      Comment: Glucose reference range applies only to samples taken after fasting for at least 8 hours.  Glucose, capillary     Status: Abnormal    Collection Time: 10/05/22  9:40 PM  Result Value Ref Range    Glucose-Capillary 165 (H) 70 - 99 mg/dL      Comment: Glucose reference range applies only to samples taken after fasting for at least 8 hours.  Basic metabolic panel     Status: Abnormal    Collection Time: 10/06/22  1:25 AM  Result Value Ref Range    Sodium 134 (L) 135 - 145 mmol/L    Potassium 4.5 3.5 - 5.1 mmol/L    Chloride 109 98 - 111 mmol/L    CO2 19 (L) 22 - 32 mmol/L    Glucose, Bld 187 (H) 70 - 99 mg/dL      Comment: Glucose reference range applies only to samples taken after fasting for at least 8 hours.    BUN 49 (H) 8 - 23 mg/dL    Creatinine, Ser 0.98 (H) 0.44 - 1.00 mg/dL    Calcium 7.3 (L) 8.9 - 10.3 mg/dL    GFR, Estimated 44 (L) >60 mL/min      Comment: (NOTE) Calculated using the CKD-EPI Creatinine Equation (2021)      Anion gap 6 5 - 15      Comment: Performed at [facility], [address] [place], [place] [address]  CBC     Status: Abnormal    Collection Time: 10/06/22  1:25 AM  Result Value Ref Range    WBC 10.2 4.0 - 10.5 K/uL    RBC 2.04 (L) 3.87 - 5.11 MIL/uL    Hemoglobin 6.5 (LL) 12.0 - 15.0 g/dL      Comment: REPEATED TO VERIFY THIS CRITICAL RESULT HAS VERIFIED AND BEEN CALLED TO LOPEZ, V RN BY FARZANEH AMIREHSANI ON 05 20 2024 AT  0231, AND HAS BEEN READ BACK.       HCT 20.8 (L) 36.0 - 46.0 %    MCV 102.0 (H) 80.0 - 100.0 fL    MCH 31.9 26.0 -  34.0 pg    MCHC 31.3 30.0 - 36.0 g/dL    RDW 16.1 09.6 - 04.5 %    Platelets 183 150 - 400 K/uL    nRBC 0.5 (H) 0.0 - 0.2 %      Comment: Performed at [facility], [address] [place], [place] [address]  Type and screen [facility]     Status: None (Preliminary result)    Collection Time: 10/06/22  4:11 AM  Result Value Ref Range    ABO/RH(D) O POS      Antibody Screen NEG      Sample Expiration 10/09/2022,2359      Unit Number [id number]      Blood Component Type RBC, LR IRR      Unit division 00      Status of Unit ISSUED      Transfusion Status OK TO TRANSFUSE      Crossmatch Result          Compatible Performed at [facility], [address] [place], [place] [address]    Prepare RBC (crossmatch)     Status: None    Collection Time: 10/06/22  4:11 AM  Result Value Ref Range    Order Confirmation          ORDER PROCESSED BY BLOOD BANK Performed at [facility], [address], [place], [place] [address]    Glucose, capillary     Status: Abnormal    Collection Time: 10/06/22  7:27 AM  Result Value Ref Range    Glucose-Capillary 163 (H) 70 - 99 mg/dL      Comment: Glucose reference range applies only to samples taken after fasting for at least 8 hours.      Imaging Results (Last 48 hours)  No results found.         Blood pressure 117/64, pulse 75, temperature 98.2 F (36.8 C), temperature source Oral, resp. rate 17, height 5\' 3"  (1.6 m), weight 68.9 kg, SpO2 100 %.   Medical Problem List and Plan: 1. Functional deficits secondary to acute lacunar infarcts of bilateral corona radiata after left intertrochanteric hip fracture.  Status post intramedullary nailing 10/03/2022.  Weightbearing as tolerated             -patient may shower             -ELOS/Goals: 10-14 days, supervision to min assist goals with PT,  OT, SLP             -pt will need positive reinforcement/ego support as she moves forward with rehab program. Family appear very supportive. Pt and family will take "one day at a time".  2.  Antithrombotics: -DVT/anticoagulation:  Pharmaceutical: Xarelto             -antiplatelet therapy: N/A 3. Pain Management: Oxycodone/Robaxin as needed 4. Mood/Behavior/Sleep: Team to provide emotional support             -pt with anxiety and likely reactive depression about being in hospital as well as the stroke and hip fracture themselves.              -antipsychotic agents: N/A 5. Neuropsych/cognition: This patient is capable of making decisions on her own behalf. 6. Skin/Wound Care: Routine skin checks 7. Fluids/Electrolytes/Nutrition: Routine in and outs with follow-up chemistries 8.  Acute blood loss anemia.  Follow-up CBC 9.  Diabetes mellitus.  Latest hemoglobin A1c 5.7.  Currently on SSI.  Patient on Amaryl  1.5 mg daily prior to admission.  Resume as needed             -cbg's currently under fair control 10.  Permissive hypertension.  Patient on lisinopril 20 mg daily prior to admission.   -bp controlled. Resume meds as needed 11.  Hypothyroidism.  Synthroid 12.  Hyperlipidemia.  Crestor 13.  Atrial fibrillation.  Xarelto resumed.  Cardiac rate controlled, regular rhythm.      Mcarthur Rossetti Angiulli, PA-C 10/06/2022  I have personally performed a face to face diagnostic evaluation of this patient and formulated the key components of the plan.  Additionally, I have personally reviewed laboratory data, imaging studies, as well as relevant notes and concur with the physician assistant's documentation above.  The patient's status has not changed from the original H&P.  Any changes in documentation from the acute care chart have been noted above.  Ranelle Oyster, MD, [place]

## 2022-10-06 NOTE — Care Management Important Message (Signed)
Important Message  Patient Details  Name: EMELENE HEPPLER MRN: [id number] Date of Birth: [birth year]   Medicare Important Message Given:  Yes     Sherilyn Banker 10/06/2022, 2:15 PM

## 2022-10-06 NOTE — Progress Notes (Signed)
       Overnight   NAME: Amanda [name] MRN: [id number] DOB : [birth year]    Date of Service   10/06/2022   HPI/Events of Note    Notified by RN for Hgb value on AM labs of 6.5 g/dL  No obvious bleeding seen.   Interventions/ Plan   Transfusion order for 1 unit PRBC H&H post transfusion (to be ordered by RN at appropriate draw time).      Chinita Greenland BSN MSNA MSN ACNPC-AG Acute Care Nurse Practitioner [facility]

## 2022-10-06 NOTE — TOC Transition Note (Signed)
Transition of Care (TOC) - CM/SW Discharge Note Donn Pierini RN, BSN Transitions of Care Unit 4E- RN Case Manager See Treatment Team for direct phone # Cross Coverage for 5N  Patient Details  Name: Amanda [name] MRN: [id number] Date of Birth: 1918/06/14  Transition of Care [facility]) CM/SW Contact:  Darrold Span, RN Phone Number: 10/06/2022, 11:40 AM   Clinical Narrative:    TOC has been notified by INPT rehab liaison that pt has bed available today for transition to [facility] INPT rehab.   MD has cleared pt as medially stable for transition to INPT rehab- No further TOC needs noted.   Pt to transition this afternoon to [facility] INPT rehab.    Final next level of care: IP Rehab Facility Barriers to Discharge: Barriers Resolved   Patient Goals and CMS Choice CMS Medicare.gov Compare Post Acute Care list provided to:: Patient Choice offered to / list presented to : Adult Children (son Chanetta Marshall)  Discharge Placement                   [facility] INPT rehab      Discharge Plan and Services Additional resources added to the After Visit Summary for   In-house Referral: Clinical Social Work   Post Acute Care Choice: IP Rehab          DME Arranged: N/A DME Agency: NA       HH Arranged: NA HH Agency: NA        Social Determinants of Health (SDOH) Interventions SDOH Screenings   Food Insecurity: No Food Insecurity (09/29/2022)  Housing: Low Risk  (09/29/2022)  Transportation Needs: No Transportation Needs (09/29/2022)  Utilities: Not At Risk (09/29/2022)  Tobacco Use: Low Risk  (10/02/2022)     Readmission Risk Interventions    10/06/2022   11:39 AM  Readmission Risk Prevention Plan  Transportation Screening Complete  Home Care Screening Complete  Medication Review (RN CM) Complete

## 2022-10-06 NOTE — H&P (Signed)
Physical Medicine and Rehabilitation Admission H&P    Chief Complaint  Patient presents with   Fall  : HPI: Amanda [name] with history of atrial fibrillation maintained on Xarelto, diabetes mellitus, hypothyroidism.  Per chart review patient lives alone.  Split-level home, 3 stairs up/down stairs with 1 side railing.  Used a rollator and a cane in the home..  Presented 09/29/2022 after mechanical fall when she missed 1 step landing on her left hip.  No loss of consciousness.  Cranial CT scan negative for acute changes.  X-rays and imaging revealed displaced intertrochanteric fracture of the left hip.  Admission chemistries unremarkable except sodium 132, glucose 246, hemoglobin 11.2.  Underwent open treatment of intertrochanteric hip fracture with intramedullary implant 10/01/2022 per Dr. Roda Shutters.  Weightbearing as tolerated left lower extremity.  Postoperative slurred speech and left-sided facial droop.  MRI of the brain showed acute lacunar infarct in the bilateral corona radiata.  MRA negative for large vessel occlusion.  Carotid Dopplers with no ICA stenosis.  Echocardiogram with ejection fraction of 60 to 65% no wall motion abnormalities.  Neurology follow-up patient was cleared to resume Xarelto as prior to admission.  She is currently maintained dysphagia #1 nectar thick liquid diet.  Hospital course acute blood loss anemia 6.5 orders for transfusion 10/06/2022 of 1 unit packed red blood cells..  Therapy evaluations completed due to patient decreased functional mobility was admitted for a comprehensive rehab program.  Review of Systems  Constitutional:  Negative for chills and fever.  HENT:  Positive for hearing loss.   Eyes:  Negative for blurred vision and double vision.  Respiratory:  Negative for cough, shortness of breath and wheezing.   Cardiovascular:  Positive for palpitations. Negative for chest pain and leg swelling.  Gastrointestinal:  Positive for constipation. Negative for  heartburn, nausea and vomiting.  Genitourinary:  Negative for dysuria, flank pain and hematuria.  Musculoskeletal:  Positive for falls, joint pain and myalgias.  Skin:  Negative for rash.  Neurological:  Positive for speech change and weakness.  Psychiatric/Behavioral:  The patient is nervous/anxious.   All other systems reviewed and are negative.  Past Medical History:  Diagnosis Date   Carpal tunnel syndrome    Chronic anticoagulation    Chronic atrial fibrillation (HCC)    Diabetes mellitus    NON-INSULIN   Hypothyroidism    Sinus congestion    Past Surgical History:  Procedure Laterality Date   APPENDECTOMY     CARDIOVASCULAR STRESS TEST  07/10/2004   EF 81%   CHOLECYSTECTOMY     INTRAMEDULLARY (IM) NAIL INTERTROCHANTERIC Left 10/01/2022   Procedure: INTRAMEDULLARY (IM) NAIL INTERTROCHANTERIC;  Surgeon: Tarry Kos, MD;  Location: [facility] OR;  Service: Orthopedics;  Laterality: Left;   US ECHOCARDIOGRAPHY  12/06/2007   EF 55-60%   Family History  Problem Relation Age of Onset   Stroke Mother    Hypertension Mother    Diabetes Sister    Hypertension Other    Heart attack Neg Hx    Social History:  reports that she has never smoked. She has never used smokeless tobacco. She reports that she does not drink alcohol and does not use drugs. Allergies:  Allergies  Allergen Reactions   Calcium-Vitamin D [Calcium Carb-Ergocalciferol] Other (See Comments)    Constipation    Tape Other (See Comments)    Skin very thin, tears easily   Codeine Nausea Only   Hydrocodone Nausea Only   Metoprolol Nausea Only   Rythmol [Propafenone Hcl]  Other (See Comments)    Dizziness    Shellfish Allergy Itching and Rash    Mussels only.   Sulfa Antibiotics Nausea Only   Medications Prior to Admission  Medication Sig Dispense Refill   acetaminophen (TYLENOL) 500 MG tablet Take 1,000 mg by mouth every 6 (six) hours as needed (for pain).     Cholecalciferol (VITAMIN D-3 PO) Take 1 capsule by  mouth daily.     Cyanocobalamin (VITAMIN B-12 PO) Take 1 tablet by mouth daily.     ferrous sulfate 325 (65 FE) MG EC tablet Take 1 tablet (325 mg total) by mouth 2 (two) times daily. 60 tablet 0   furosemide (LASIX) 40 MG tablet Take 1 tablet (40 mg total) by mouth daily as needed for fluid or edema. 30 tablet 0   glimepiride (AMARYL) 1 MG tablet Take 1.5 mg by mouth daily with breakfast.      Homeopathic Products (ARNICARE ARNICA) CREA Apply 1 application  topically 2 (two) times daily as needed (leg pains).     levothyroxine (SYNTHROID, LEVOTHROID) 88 MCG tablet Take 88 mcg by mouth daily before breakfast.     lisinopril (ZESTRIL) 20 MG tablet Take 20 mg by mouth daily.     loratadine (CLARITIN) 10 MG tablet Take 10 mg by mouth daily. [employer] brand - Allerclear     MAGNESIUM OXIDE PO Take 1 tablet by mouth every evening.     meclizine (ANTIVERT) 25 MG tablet Take one every 6-8 hours as needed for dizziness (Patient taking differently: Take 25 mg by mouth daily as needed for dizziness.) 20 tablet 0   Multiple Vitamins-Minerals (PRESERVISION AREDS 2 PO) Take 1 capsule by mouth 2 (two) times daily.      Niacin (VITAMIN B-3 PO) Take 1 tablet by mouth daily.     Omega-3 Fatty Acids (FISH OIL PO) Take 1 capsule by mouth every evening.     Probiotic Product (PROBIOTIC DAILY PO) Take 2 capsules by mouth daily as needed (digestion).     rOPINIRole (REQUIP) 0.25 MG tablet Take 0.25 mg by mouth 2 (two) times daily.     sodium chloride (OCEAN) 0.65 % nasal spray Place 1 spray into the nose daily as needed for congestion.     XARELTO 15 MG TABS tablet TAKE 1 TABLET(15 MG) BY MOUTH DAILY WITH SUPPER (Patient taking differently: Take 15 mg by mouth daily with supper.) 30 tablet 5   Wheat Dextrin (BENEFIBER) POWD Take 1 Scoop by mouth daily.        Home: Home Living Family/patient expects to be discharged to:: Private residence Living Arrangements: Alone Available Help at Discharge: Family, Available 24  hours/day Type of Home: House Home Access: Stairs to enter [employer] of Steps: 1 (3") + 4 Entrance Stairs-Rails: Left Home Layout:  (spit level) Alternate Level Stairs-Number of Steps: 1 step up, 1 step down Alternate Level Stairs-Rails: Right Bathroom Shower/Tub: Tub/shower unit, Health visitor: Standard Bathroom Accessibility: Yes Home Equipment: Rollator (4 wheels), Cane - single point, BSC/3in1 Additional Comments: Pt lives alone in split level house, 3 stairs up/down stairs with one side railing. Pt uses rollator and cane in home.  Lives With: Alone   Functional History: Prior Function Prior Level of Function : Needs assist, History of Falls (last six months) Mobility Comments: Pt uses rollator at all times except with family takes her out in the community she will briefly use SPC to get from car to rollator. Pt with hx of a few  falls in the past 6 months. ADLs Comments: Family provides pt with meals or pt fixes simple things, has an aide that assists with bathing 1x/week, pt dresses herself, pays for house cleaner 1x/month, pays all her bills.  Functional Status:  Mobility: Bed Mobility Overal bed mobility: Needs Assistance Bed Mobility: Rolling Supine to sit: Max assist General bed mobility comments: Performed rolling in chair to place blanket under her for lateral supine transfer.  Pt required total +2 for transfers.  Once in bed performed transfer rolling in bed to remove excess blankets and pads under patient. Transfers Overall transfer level:  (did not perform patient is very lethargic this pm.) Equipment used: Rolling walker (2 wheels) Transfers: Sit to/from Stand, Bed to chair/wheelchair/BSC Sit to Stand: Max assist Bed to/from chair/wheelchair/BSC transfer type:: Stand pivot Stand pivot transfers: Total assist Step pivot transfers: Max assist, +2 physical assistance, From elevated surface General transfer comment: Sit - stand from EOB x 2  trials max A, total A to SPT to recliner. Lift pad under pt for nursing staff to safely return back to bed later Ambulation/Gait General Gait Details: unable    ADL: ADL Overall ADL's : Needs assistance/impaired Eating/Feeding: NPO Eating/Feeding Details (indicate cue type and reason): until speech eval due to feeling like food gets stuck when eating Grooming: Wash/dry hands, Wash/dry face, Min guard, Sitting Upper Body Bathing: Sitting, Moderate assistance Upper Body Bathing Details (indicate cue type and reason): poor sitting balance Lower Body Bathing: Total assistance, +2 for physical assistance, Sitting/lateral leans Upper Body Dressing : Moderate assistance, Sitting Lower Body Dressing: Total assistance, +2 for physical assistance, Sit to/from stand Toilet Transfer: Total assistance, Maximal assistance Toilet Transfer Details (indicate cue type and reason): simulated to recliner Toileting- Clothing Manipulation and Hygiene: Total assistance Functional mobility during ADLs: Maximal assistance, Total assistance General ADL Comments: Pt requires significant assistance with sitting on on EOB, LB ADLs, and transfers. Poor balance with standing, x2 for physical assistance and safety  Cognition: Cognition Overall Cognitive Status: Within Functional Limits for tasks assessed Arousal/Alertness: Lethargic (pt lethargic suspected due to both medications and pain) Orientation Level: Oriented X4 Year: 2022 Month: May Day of Week: Correct Attention: Focused Focused Attention: Impaired Focused Attention Impairment: Functional basic (suspected due to lethargy levels) Memory: Appears intact Awareness: Appears intact Problem Solving:  (unable to determine due to lethargy levels) Safety/Judgment:  (unable to determine due to lethargy levels) Cognition Arousal/Alertness: Awake/alert Behavior During Therapy: WFL for tasks assessed/performed Overall Cognitive Status: Within Functional Limits  for tasks assessed General Comments: Pt noted to have some slurred speech, family reports pt is not enunciating as well as prior to sx, no other focal deficits noted - MD & nurse made aware.  Physical Exam: Blood pressure 117/64, pulse 75, temperature 98.2 F (36.8 C), temperature source Oral, resp. rate 17, height 5\' 3"  (1.6 m), weight 68.9 kg, SpO2 100 %. Physical Exam Constitutional:      General: She is not in acute distress.    Comments: Slightly lethargic  HENT:     Head: Normocephalic and atraumatic.     Right Ear: External ear normal.     Left Ear: External ear normal.     Mouth/Throat:     Mouth: Mucous membranes are moist.  Eyes:     Conjunctiva/sclera: Conjunctivae normal.  Cardiovascular:     Rate and Rhythm: Normal rate and regular rhythm.     Heart sounds: No murmur heard.    No gallop.  Pulmonary:  Effort: Pulmonary effort is normal. No respiratory distress.  Abdominal:     General: Bowel sounds are normal. There is no distension.     Palpations: Abdomen is soft.     Tenderness: There is no abdominal tenderness.  Musculoskeletal:     Cervical back: Normal range of motion.  Skin:    General: Skin is warm.     Comments: Surgical hip incision clean and dry.  Neurological:     Comments: Patient is slightly lethargic but awakens enough to carry a conversation and follow basic commands. Fair insight and awareness. Able to provide me her street address. Left facial weakness and dysarthria present. Remaining CN exam normal. UE grossly 4/5 prox to distal, RLE 3+ to 4/5. LLE limited due to pain. Pt not completley cooperative with MMT. Sensation appears to be intact. No resting tone. DTR's tr.       Psychiatric:     Comments: Pt anxious. Sometimes closes down d/t anxiety.      Results for orders placed or performed during the hospital encounter of 09/29/22 (from the past 48 hour(s))  Glucose, capillary     Status: Abnormal   Collection Time: 10/04/22 11:44 AM   Result Value Ref Range   Glucose-Capillary 154 (H) 70 - 99 mg/dL    Comment: Glucose reference range applies only to samples taken after fasting for at least 8 hours.  Glucose, capillary     Status: Abnormal   Collection Time: 10/04/22  4:48 PM  Result Value Ref Range   Glucose-Capillary 114 (H) 70 - 99 mg/dL    Comment: Glucose reference range applies only to samples taken after fasting for at least 8 hours.  Glucose, capillary     Status: Abnormal   Collection Time: 10/04/22  9:45 PM  Result Value Ref Range   Glucose-Capillary 112 (H) 70 - 99 mg/dL    Comment: Glucose reference range applies only to samples taken after fasting for at least 8 hours.  Basic metabolic panel     Status: Abnormal   Collection Time: 10/05/22  7:05 AM  Result Value Ref Range   Sodium 135 135 - 145 mmol/L   Potassium 5.3 (H) 3.5 - 5.1 mmol/L   Chloride 108 98 - 111 mmol/L   CO2 15 (L) 22 - 32 mmol/L   Glucose, Bld 165 (H) 70 - 99 mg/dL    Comment: Glucose reference range applies only to samples taken after fasting for at least 8 hours.   BUN 46 (H) 8 - 23 mg/dL   Creatinine, Ser 1.61 (H) 0.44 - 1.00 mg/dL   Calcium 7.6 (L) 8.9 - 10.3 mg/dL   GFR, Estimated 47 (L) >60 mL/min    Comment: (NOTE) Calculated using the CKD-EPI Creatinine Equation (2021)    Anion gap 12 5 - 15    Comment: Performed at [facility], [address], [place], [place] [address]  Glucose, capillary     Status: Abnormal   Collection Time: 10/05/22  7:51 AM  Result Value Ref Range   Glucose-Capillary 144 (H) 70 - 99 mg/dL    Comment: Glucose reference range applies only to samples taken after fasting for at least 8 hours.  Potassium     Status: Abnormal   Collection Time: 10/05/22 10:10 AM  Result Value Ref Range   Potassium 5.3 (H) 3.5 - 5.1 mmol/L    Comment: Performed at [facility], [address], [place], [place] [address]  Glucose, capillary  Status: Abnormal   Collection Time: 10/05/22 11:21 AM   Result Value Ref Range   Glucose-Capillary 170 (H) 70 - 99 mg/dL    Comment: Glucose reference range applies only to samples taken after fasting for at least 8 hours.  Glucose, capillary     Status: Abnormal   Collection Time: 10/05/22  4:12 PM  Result Value Ref Range   Glucose-Capillary 154 (H) 70 - 99 mg/dL    Comment: Glucose reference range applies only to samples taken after fasting for at least 8 hours.  Glucose, capillary     Status: Abnormal   Collection Time: 10/05/22  9:40 PM  Result Value Ref Range   Glucose-Capillary 165 (H) 70 - 99 mg/dL    Comment: Glucose reference range applies only to samples taken after fasting for at least 8 hours.  Basic metabolic panel     Status: Abnormal   Collection Time: 10/06/22  1:25 AM  Result Value Ref Range   Sodium 134 (L) 135 - 145 mmol/L   Potassium 4.5 3.5 - 5.1 mmol/L   Chloride 109 98 - 111 mmol/L   CO2 19 (L) 22 - 32 mmol/L   Glucose, Bld 187 (H) 70 - 99 mg/dL    Comment: Glucose reference range applies only to samples taken after fasting for at least 8 hours.   BUN 49 (H) 8 - 23 mg/dL   Creatinine, Ser 1.61 (H) 0.44 - 1.00 mg/dL   Calcium 7.3 (L) 8.9 - 10.3 mg/dL   GFR, Estimated 44 (L) >60 mL/min    Comment: (NOTE) Calculated using the CKD-EPI Creatinine Equation (2021)    Anion gap 6 5 - 15    Comment: Performed at [facility], [address] [place], [place] [address]  CBC     Status: Abnormal   Collection Time: 10/06/22  1:25 AM  Result Value Ref Range   WBC 10.2 4.0 - 10.5 K/uL   RBC 2.04 (L) 3.87 - 5.11 MIL/uL   Hemoglobin 6.5 (LL) 12.0 - 15.0 g/dL    Comment: REPEATED TO VERIFY THIS CRITICAL RESULT HAS VERIFIED AND BEEN CALLED TO LOPEZ, V RN BY FARZANEH AMIREHSANI ON 05 20 2024 AT 0231, AND HAS BEEN READ BACK.     HCT 20.8 (L) 36.0 - 46.0 %   MCV 102.0 (H) 80.0 - 100.0 fL   MCH 31.9 26.0 - 34.0 pg   MCHC 31.3 30.0 - 36.0 g/dL   RDW 54.0 98.1 - 19.1 %   Platelets 183 150 - 400 K/uL   nRBC 0.5 (H) 0.0  - 0.2 %    Comment: Performed at [facility], [address], [place], [place] [address]  Type and screen [facility]     Status: None (Preliminary result)   Collection Time: 10/06/22  4:11 AM  Result Value Ref Range   ABO/RH(D) O POS    Antibody Screen NEG    Sample Expiration 10/09/2022,2359    Unit Number [id number]    Blood Component Type RBC, LR IRR    Unit division 00    Status of Unit ISSUED    Transfusion Status OK TO TRANSFUSE    Crossmatch Result      Compatible Performed at [facility], [address], [place], [place] [address]   Prepare RBC (crossmatch)     Status: None   Collection Time: 10/06/22  4:11 AM  Result Value Ref Range   Order Confirmation  ORDER PROCESSED BY BLOOD BANK Performed at [facility], [address] [place], [place] [address]   Glucose, capillary     Status: Abnormal   Collection Time: 10/06/22  7:27 AM  Result Value Ref Range   Glucose-Capillary 163 (H) 70 - 99 mg/dL    Comment: Glucose reference range applies only to samples taken after fasting for at least 8 hours.   No results found.    Blood pressure 117/64, pulse 75, temperature 98.2 F (36.8 C), temperature source Oral, resp. rate 17, height 5\' 3"  (1.6 m), weight 68.9 kg, SpO2 100 %.  Medical Problem List and Plan: 1. Functional deficits secondary to acute bilateral corona radiata ischemic infarct after left intertrochanteric hip fracture.  Status post intramedullary nailing 10/03/2022.  Weightbearing as tolerated  -patient may shower  -ELOS/Goals: 10-14 days, supervision to min assist goals with PT, OT, SLP  -pt will need positive reinforcement/ego support as she moves forward with rehab program. Family appear very supportive. Pt and family will take "one day at a time".  2.  Antithrombotics: -DVT/anticoagulation:  Pharmaceutical: Xarelto  -antiplatelet therapy: N/A 3. Pain Management: Oxycodone/Robaxin as needed 4.  Mood/Behavior/Sleep: Team to provide emotional support  -pt with anxiety and likely reactive depression about being in hospital as well as the stroke and hip fracture themselves.   -antipsychotic agents: N/A 5. Neuropsych/cognition: This patient is capable of making decisions on her own behalf. 6. Skin/Wound Care: Routine skin checks 7. Fluids/Electrolytes/Nutrition: Routine in and outs with follow-up chemistries 8.  Acute blood loss anemia.  Follow-up CBC 9.  Diabetes mellitus.  Latest hemoglobin A1c 5.7.  Currently on SSI.  Patient on Amaryl 1.5 mg daily prior to admission.  Resume as needed  -cbg's currently under fair control 10.  Permissive hypertension.  Patient on lisinopril 20 mg daily prior to admission.   -bp controlled. Resume meds as needed 11.  Hypothyroidism.  Synthroid 12.  Hyperlipidemia.  Crestor 13.  Atrial fibrillation.  Xarelto resumed.  Cardiac rate controlled, regular rhythm.    Mcarthur Rossetti Angiulli, PA-C 10/06/2022

## 2022-10-06 NOTE — Progress Notes (Signed)
Speech Language Pathology Treatment: Dysphagia;Cognitive-Linquistic  Patient Details Name: Amanda [name] MRN: [id number] DOB: 02/18/1919 Today's Date: 10/06/2022 Time: [id number] SLP Time Calculation (min) (ACUTE ONLY): 8 min  Assessment / Plan / Recommendation Clinical Impression  Followed up with daughter, son, SIL and pt. Pt quite tired after a long conversation about going to Rehab with staff and family. Discussion with daughter focused on coughing (improved), feeding strategies (dtr verbalized slow rate, following with bites and sips, upright position), liquid texture (Nectar thick liquids while deconditioned, but should not be used long term given risk for dehydration and decreased intake). Overall daughter feels that pt is doing well and severity of coughing is better. Pt is frustrated by her communication difficulty. She has a lot to say to family, but intelligibility is easily impacted by fatigue and medication. We talked about how she will start to use strategies for slow, loud over articulated speech as she has the endurance to do so. Continue SLP at next level of care.   HPI HPI: Pt is a 89 year old female admitted after a fall and subsequent pain preventing ambulation. Patient was found to have a left hip fracture. Underwnt surgery on 5/15. Reported feeling of food and liquid in throat. Family reports this has been ongoing for a while. She is afraid of choking. Found to have had acute CVA perioperatively. MRI shows Acute lacunar infarct in the bilateral corona radiata.      SLP Plan  Continue with current plan of care      Recommendations for follow up therapy are one component of a multi-disciplinary discharge planning process, led by the attending physician.  Recommendations may be updated based on patient status, additional functional criteria and insurance authorization.    Recommendations  Diet recommendations: Dysphagia 1 (puree);Nectar-thick liquid Liquids provided via:  Cup;Straw;Teaspoon Medication Administration: Crushed with puree Supervision: Full supervision/cueing for compensatory strategies;Staff to assist with self feeding;Trained caregiver to feed patient Compensations: Follow solids with liquid;Slow rate;Small sips/bites;Minimize environmental distractions Postural Changes and/or Swallow Maneuvers: Seated upright 90 degrees;Upright 30-60 min after meal                              Continue with current plan of care     Viraat Vanpatten, Riley Nearing  10/06/2022, 12:34 PM

## 2022-10-06 NOTE — Progress Notes (Signed)
Inpatient Rehabilitation Center Individual Statement of Services  Patient Name:  Amanda [name]  Date:  10/06/2022  Welcome to the Inpatient Rehabilitation Center.  Our goal is to provide you with an individualized program based on your diagnosis and situation, designed to meet your specific needs.  With this comprehensive rehabilitation program, you will be expected to participate in at least 3 hours of rehabilitation therapies Monday-Friday, with modified therapy programming on the weekends.  Your rehabilitation program will include the following services:  Physical Therapy (PT), Occupational Therapy (OT), Speech Therapy (ST), 24 hour per day rehabilitation nursing, Therapeutic Recreaction (TR), Neuropsychology, Care Coordinator, Rehabilitation Medicine, Nutrition Services, Pharmacy Services, and Other  Weekly team conferences will be held on Wednesdays to discuss your progress.  Your Inpatient Rehabilitation Care Coordinator will talk with you frequently to get your input and to update you on team discussions.  Team conferences with you and your family in attendance may also be held.  Expected length of stay: 10-14 Days  Overall anticipated outcome: Supervision  Depending on your progress and recovery, your program may change. Your Inpatient Rehabilitation Care Coordinator will coordinate services and will keep you informed of any changes. Your Inpatient Rehabilitation Care Coordinator's name and contact numbers are listed  below.  The following services may also be recommended but are not provided by the Inpatient Rehabilitation Center:   Home Health Rehabiltiation Services Outpatient Rehabilitation Services    Arrangements will be made to provide these services after discharge if needed.  Arrangements include referral to agencies that provide these services.  Your insurance has been verified to be:   Medicare A & B Your primary doctor is:  Lupita Raider, MD  Pertinent information  will be shared with your doctor and your insurance company.  Inpatient Rehabilitation Care Coordinator:  Lavera Guise, [place] [phone] or [phone]  Information discussed with and copy given to patient by: Andria Rhein, 10/06/2022, 2:59 PM

## 2022-10-06 NOTE — Progress Notes (Signed)
Inpatient Rehab Admissions Coordinator:   I have a bed available for this patient to admit to CIR today.  Discussed with Dr. Alvino Chapel who is in agreement.  TOC aware.  Will f/u with pt/family at bedside to let them know.   Estill Dooms, PT, DPT Admissions Coordinator [phone] 10/06/22  10:07 AM

## 2022-10-06 NOTE — Progress Notes (Signed)
PMR Admission Coordinator Pre-Admission Assessment   Patient: Amanda [name] is an 89 y.o., female MRN: [id number] DOB: Jun 24, 1918 Height: 5\' 3"  (160 cm) Weight: 68.9 kg   Insurance Information HMO:     PPO:      PCP:      IPA:      80/20:      OTHER:  PRIMARY: Medicare a and b      Policy#: [id number]      Subscriber: pt Benefits:  Phone #: passport one source     Name: 5/17 Eff. Date: a 09/16/1985 and b 11/17/1986     Deduct: $1632      Out of Pocket Max: none      Life Max: none CIR: 100%      SNF: 20 full days Outpatient: 80%     Co-Pay: 20% Home Health: 100%      Co-Pay: none DME: 80%     Co-Pay: 20% Providers: pt choice  SECONDARY: AARP supplement      Policy#: [id number]   Financial Counselor:       Phone#:    The "Data Collection Information Summary" for patients in Inpatient Rehabilitation Facilities with attached "Privacy Act Statement-Health Care Records" was provided and verbally reviewed with: Family   Emergency Contact Information Contact Information       Name Relation Home Work Mobile    Blossom Daughter [phone]   [phone]    Sutphin,Nancy Daughter [phone]        Tiajuana Amass [phone]        Zimmerman,Linda Relative [phone]        Jamieka, [place]     [phone]         Current Medical History  Patient Admitting Diagnosis: Hip fx and CVA   History of Present Illness: 89 year old female with history of afib on Xarelto, CVA, HTN, restless leg syndrome,  CKD stage IIIa,DM and hypothyroidism. Presented on 09/29/22 after a fall on stairs.   Imaging revealed left hip fracture. Ortho consulted. Underwent IM nail fixation by Dr Roda Shutters on 5/15. Postoperatively on 5/16 developed new slurred speech and facial droop. Neurology consulted. MRI revealed two small white matter acute ischemic infarctions bilaterally. AKI on CKD IIIA. Baseline creat 1. Hold lisinopril. And given IV fluids. Renal Ultrasound pending. Remain on Xarelto. SSI for  type 2 DM.Marland Kitchen   Complete NIHSS TOTAL: 6   Patient[facility] has been reviewed by the rehabilitation admission coordinator and physician.   Past Medical History      Past Medical History:  Diagnosis Date   Carpal tunnel syndrome     Chronic anticoagulation     Chronic atrial fibrillation (HCC)     Diabetes mellitus      NON-INSULIN   Hypothyroidism     Sinus congestion      Has the patient had major surgery during 100 days prior to admission? Yes   Family History   family history includes Diabetes in her sister; Hypertension in her mother and another family member; Stroke in her mother.   Current Medications   Current Facility-Administered Medications:    0.9 %  sodium chloride infusion, , Intravenous, Continuous, Noralee Stain, DO, Last Rate: 100 mL/hr at 10/06/22 0851, Restarted at 10/06/22 0851   acetaminophen (TYLENOL) tablet 500 mg, 500 mg, Oral, Q4H PRN, Noralee Stain, DO   alum & mag hydroxide-simeth (MAALOX/MYLANTA) 200-200-20 MG/5ML suspension 30 mL, 30 mL, Oral, Q4H PRN, Tarry Kos, MD, 30 mL at  10/02/22 0827   Chlorhexidine Gluconate Cloth 2 % PADS 6 each, 6 each, Topical, Daily, Tarry Kos, MD, 6 each at 10/05/22 1032   docusate (COLACE) 50 MG/5ML liquid 100 mg, 100 mg, Oral, BID, Noralee Stain, DO, 100 mg at 10/05/22 2038   feeding supplement (ENSURE ENLIVE / ENSURE PLUS) liquid 237 mL, 237 mL, Oral, BID BM, Noralee Stain, DO, 237 mL at 10/05/22 1031   Glycerin (Adult) 2 g suppository 1 suppository, 1 suppository, Rectal, Daily PRN, Noralee Stain, DO, 1 suppository at 10/05/22 0640   insulin aspart (novoLOG) injection 0-9 Units, 0-9 Units, Subcutaneous, TID WC, Tarry Kos, MD, 2 Units at 10/06/22 0854   levothyroxine (SYNTHROID) tablet 88 mcg, 88 mcg, Oral, Q0600, Tarry Kos, MD, 88 mcg at 10/06/22 0602   loratadine (CLARITIN) tablet 10 mg, 10 mg, Oral, Daily, Tarry Kos, MD, 10 mg at 10/05/22 1031    menthol-cetylpyridinium (CEPACOL) lozenge 3 mg, 1 lozenge, Oral, PRN **OR** phenol (CHLORASEPTIC) mouth spray 1 spray, 1 spray, Mouth/Throat, PRN, Tarry Kos, MD   methocarbamol (ROBAXIN) tablet 500 mg, 500 mg, Oral, Q6H PRN, 500 mg at 10/04/22 2114 **OR** methocarbamol (ROBAXIN) 500 mg in dextrose 5 % 50 mL IVPB, 500 mg, Intravenous, Q6H PRN, Tarry Kos, MD   ondansetron (ZOFRAN) tablet 4 mg, 4 mg, Oral, Q6H PRN **OR** ondansetron (ZOFRAN) injection 4 mg, 4 mg, Intravenous, Q6H PRN, Tarry Kos, MD, 4 mg at 10/02/22 1648   oxyCODONE (Oxy IR/ROXICODONE) immediate release tablet 10 mg, 10 mg, Oral, Q4H PRN, Noralee Stain, DO, 10 mg at 10/06/22 1610   oxyCODONE (Oxy IR/ROXICODONE) immediate release tablet 5 mg, 5 mg, Oral, Q4H PRN, Noralee Stain, DO   Rivaroxaban (XARELTO) tablet 15 mg, 15 mg, Oral, Q supper, Rexford Maus, RPH, 15 mg at 10/05/22 1659   rOPINIRole (REQUIP) tablet 0.25 mg, 0.25 mg, Oral, QHS, Xu, Naiping M, MD, 0.25 mg at 10/05/22 2037   rosuvastatin (CRESTOR) tablet 5 mg, 5 mg, Oral, Daily, Palikh, Gaurang M, MD, 5 mg at 10/05/22 1031   sodium chloride (OCEAN) 0.65 % nasal spray 1 spray, 1 spray, Nasal, Daily PRN, Tarry Kos, MD   Patients Current Diet:  Diet Order                  DIET - DYS 1 Room service appropriate? No; Fluid consistency: Nectar Thick  Diet effective now                       Precautions / Restrictions Precautions Precautions: Fall Restrictions Weight Bearing Restrictions: Yes RLE Weight Bearing: Weight bearing as tolerated LLE Weight Bearing: Weight bearing as tolerated    Has the patient had 2 or more falls or a fall with injury in the past year? Yes   Prior Activity Level Limited Community (1-2x/wk): Mod I with RW, I with dressing; aide once week to asist her in showering; family provides meals   Prior Functional Level Self Care: Did the patient need help bathing, dressing, using the toilet or eating? Needed some help  showering but otherwise dressed self daily   Indoor Mobility: Did the patient need assistance with walking from room to room (with or without device)? Independent   Stairs: Did the patient need assistance with internal or external stairs (with or without device)? Independent   Functional Cognition: Did the patient need help planning regular tasks such as shopping or remembering to take medications? Independent   Patient Information  Are you of Hispanic, Latino/a,or Spanish origin?: A. No, not of Hispanic, Latino/a, or Spanish origin, X. Patient unable to respond (per son) What is your race?: X. Patient unable to respond (son states white) Do you need or want an interpreter to communicate with a doctor or health care staff?: 9. Unable to respond (son states no)   Patient's Response To:  Health Literacy and Transportation Is the patient able to respond to health literacy and transportation needs?: No Health Literacy - How often do you need to have someone help you when you read instructions, pamphlets, or other written material from your doctor or pharmacy?: Patient unable to respond In the past 12 months, has lack of transportation kept you from medical appointments or from getting medications?: No (per son) In the past 12 months, has lack of transportation kept you from meetings, work, or from getting things needed for daily living?: No (per son)   Journalist, newspaper / Equipment Home Assistive Devices/Equipment: Medical laboratory scientific officer (specify quad or straight), Environmental consultant (specify type) Home Equipment: Rollator (4 wheels), Cane - single point, BSC/3in1   Prior Device Use: Indicate devices/aids used by the patient prior to current illness, exacerbation or injury? Walker   Current Functional Level Cognition   Arousal/Alertness: Lethargic (pt lethargic suspected due to both medications and pain) Overall Cognitive Status: Within Functional Limits for tasks assessed Orientation Level: Oriented X4 General  Comments: Pt noted to have some slurred speech, family reports pt is not enunciating as well as prior to sx, no other focal deficits noted - MD & nurse made aware. Attention: Focused Focused Attention: Impaired Focused Attention Impairment: Functional basic (suspected due to lethargy levels) Memory: Appears intact Awareness: Appears intact Problem Solving:  (unable to determine due to lethargy levels) Safety/Judgment:  (unable to determine due to lethargy levels)    Extremity Assessment (includes Sensation/Coordination)   Upper Extremity Assessment: Generalized weakness  Lower Extremity Assessment: Defer to PT evaluation LLE Deficits / Details: 3-/5 knee extension in sitting     ADLs   Overall ADL's : Needs assistance/impaired Eating/Feeding: NPO Eating/Feeding Details (indicate cue type and reason): until speech eval due to feeling like food gets stuck when eating Grooming: Wash/dry hands, Wash/dry face, Min guard, Sitting Upper Body Bathing: Sitting, Moderate assistance Upper Body Bathing Details (indicate cue type and reason): poor sitting balance Lower Body Bathing: Total assistance, +2 for physical assistance, Sitting/lateral leans Upper Body Dressing : Moderate assistance, Sitting Lower Body Dressing: Total assistance, +2 for physical assistance, Sit to/from stand Toilet Transfer: Total assistance, Maximal assistance Toilet Transfer Details (indicate cue type and reason): simulated to recliner Toileting- Clothing Manipulation and Hygiene: Total assistance Functional mobility during ADLs: Maximal assistance, Total assistance General ADL Comments: Pt requires significant assistance with sitting on on EOB, LB ADLs, and transfers. Poor balance with standing, x2 for physical assistance and safety     Mobility   Overal bed mobility: Needs Assistance Bed Mobility: Rolling Supine to sit: Max assist General bed mobility comments: Performed rolling in chair to place blanket under her  for lateral supine transfer.  Pt required total +2 for transfers.  Once in bed performed transfer rolling in bed to remove excess blankets and pads under patient.     Transfers   Overall transfer level:  (did not perform patient is very lethargic this pm.) Equipment used: Rolling walker (2 wheels) Transfers: Sit to/from Stand, Bed to chair/wheelchair/BSC Sit to Stand: Max assist Bed to/from chair/wheelchair/BSC transfer type:: Stand pivot Stand pivot transfers: Total assist  Step pivot transfers: Max assist, +2 physical assistance, From elevated surface General transfer comment: Sit - stand from EOB x 2 trials max A, total A to SPT to recliner. Lift pad under pt for nursing staff to safely return back to bed later     Ambulation / Gait / Stairs / Wheelchair Mobility   Ambulation/Gait General Gait Details: unable     Posture / Balance Dynamic Sitting Balance Sitting balance - Comments: Sitting edge of chair with posterior lean requiring assist to maintain balance Balance Overall balance assessment: Needs assistance Sitting-balance support: Bilateral upper extremity supported, Feet supported Sitting balance-Leahy Scale: Poor Sitting balance - Comments: Sitting edge of chair with posterior lean requiring assist to maintain balance Postural control: Posterior lean, Right lateral lean Standing balance support: Bilateral upper extremity supported, During functional activity, Reliant on assistive device for balance Standing balance-Leahy Scale: Zero Standing balance comment: With RW support. Pt requiring assist to maintain standing     Special needs/care consideration HOH with bilateral hearing aides Wears glasses DNR on acute hospital    Previous Home Environment  Living Arrangements: Alone  Lives With: Alone Available Help at Discharge: Family, Available 24 hours/day Type of Home: House Home Layout:  (spit level) Alternate Level Stairs-Rails: Right Alternate Level Stairs-Number of  Steps: 1 step up, 1 step down Home Access: Stairs to enter Entrance Stairs-Rails: Left Entrance Stairs-Number of Steps: 1 (3") + 4 Bathroom Shower/Tub: Tub/shower unit, Health visitor: Standard Bathroom Accessibility: Yes How Accessible: Accessible via walker Home Care Services: No Type of Home Care Services: Homehealth aide Additional Comments: Pt lives alone in split level house, 3 stairs up/down stairs with one side railing. Pt uses rollator and cane in home.   Discharge Living Setting Plans for Discharge Living Setting: Patient's home, House, Alone Type of Home at Discharge: House Discharge Home Layout:  (split level) Discharge Home Access: Stairs to enter Entrance Stairs-Rails: Left Entrance Stairs-Number of Steps: 1 + 4 Discharge Bathroom Shower/Tub: Tub/shower unit Discharge Bathroom Toilet: Standard Discharge Bathroom Accessibility: Yes How Accessible: Accessible via walker Does the patient have any problems obtaining your medications?: No   Social/Family/Support Systems Patient Roles: Parent Contact Information: daughter, Sedalia Muta is POA and son, Kathlene November main contacts Anticipated Caregiver: family and hired caregivers Anticipated Industrial/product designer Information: Diane and Kathlene November, dtr/POA and son are main contacts Ability/Limitations of Caregiver: Diane can provide supervision prn only as well as Kathlene November; they will hire assist Caregiver Availability: 24/7 Discharge Plan Discussed with Primary Caregiver: Yes Is Caregiver In Agreement with Plan?: Yes Does Caregiver/Family have Issues with Lodging/Transportation while Pt is in Rehab?: No   Goals Patient/Family Goal for Rehab: supervision with PT, supervision to min OT, supervision SLP Expected length of stay: ELOS 10 to 14 days Pt/Family Agrees to Admission and willing to participate: Yes Program Orientation Provided & Reviewed with Pt/Caregiver Including Roles  & Responsibilities: Yes   Decrease burden of Care  through IP rehab admission: n/a   Possible need for SNF placement upon discharge: not anticipated   Patient Condition: I have reviewed medical records from [facility], spoken with CM, and patient, son, and daughter. I met with patient at the bedside for inpatient rehabilitation assessment.  Patient will benefit from ongoing PT, OT, and SLP, can actively participate in 3 hours of therapy a day 5 days of the week, and can make measurable gains during the admission.  Patient will also benefit from the coordinated team approach during an Inpatient Acute Rehabilitation admission.  The patient will receive intensive therapy as well as Rehabilitation physician, nursing, social worker, and care management interventions.  Due to bladder management, bowel management, safety, skin/wound care, disease management, medication administration, pain management, and patient education the patient requires 24 hour a day rehabilitation nursing.  The patient is currently max/total with mobility and basic ADLs.  Discharge setting and therapy post discharge at home with home health is anticipated.  Patient has agreed to participate in the Acute Inpatient Rehabilitation Program and will admit today.   Preadmission Screen Completed By: Clois Dupes, RN MSN with day of admit updates by Estill Dooms, PT, DPT 10/06/2022 10:09 AM ______________________________________________________________________   Discussed status with Dr. Riley Kill on 10/06/22  at 10:09 AM  and received approval for admission today.   Admission Coordinator:  Clois Dupes, RN MSN time 10:09 AM Dorna Bloom 10/06/22     Assessment/Plan: Diagnosis: left hip fx, CVA Does the need for close, 24 hr/day Medical supervision in concert with the patient's rehab needs make it unreasonable for this patient to be served in a less intensive setting? Yes Co-Morbidities requiring supervision/potential complications: CAF, dm, CKD IIIa,  Due to bladder  management, bowel management, safety, skin/wound care, disease management, medication administration, pain management, and patient education, does the patient require 24 hr/day rehab nursing? Yes Does the patient require coordinated care of a physician, rehab nurse, PT, OT, and SLP to address physical and functional deficits in the context of the above medical diagnosis(es)? Yes Addressing deficits in the following areas: balance, endurance, locomotion, strength, transferring, bowel/bladder control, bathing, dressing, feeding, grooming, toileting, cognition, speech, and psychosocial support Can the patient actively participate in an intensive therapy program of at least 3 hrs of therapy 5 days a week? Yes The potential for patient to make measurable gains while on inpatient rehab is excellent Anticipated functional outcomes upon discharge from inpatient rehab: supervision PT, supervision and min assist OT, supervision SLP Estimated rehab length of stay to reach the above functional goals is: 10-14 days Anticipated discharge destination: Home 10. Overall Rehab/Functional Prognosis: excellent     MD Signature: Ranelle Oyster, MD, [name] Physical Medicine & Rehabilitation Medical Director Rehabilitation Services 10/06/2022

## 2022-10-07 DIAGNOSIS — I639 Cerebral infarction, unspecified: Secondary | ICD-10-CM | POA: Diagnosis not present

## 2022-10-07 LAB — COMPREHENSIVE METABOLIC PANEL
ALT: 14 U/L (ref 0–44)
AST: 18 U/L (ref 15–41)
Albumin: 2.2 g/dL — ABNORMAL LOW (ref 3.5–5.0)
Alkaline Phosphatase: 78 U/L (ref 38–126)
Anion gap: 7 (ref 5–15)
BUN: 38 mg/dL — ABNORMAL HIGH (ref 8–23)
CO2: 19 mmol/L — ABNORMAL LOW (ref 22–32)
Calcium: 7.9 mg/dL — ABNORMAL LOW (ref 8.9–10.3)
Chloride: 113 mmol/L — ABNORMAL HIGH (ref 98–111)
Creatinine, Ser: 0.86 mg/dL (ref 0.44–1.00)
GFR, Estimated: 60 mL/min — ABNORMAL LOW (ref >60)
Glucose, Bld: 177 mg/dL — ABNORMAL HIGH (ref 70–99)
Potassium: 4.7 mmol/L (ref 3.5–5.1)
Sodium: 139 mmol/L (ref 135–145)
Total Bilirubin: 1.5 mg/dL — ABNORMAL HIGH (ref 0.3–1.2)
Total Protein: 5.2 g/dL — ABNORMAL LOW (ref 6.5–8.1)

## 2022-10-07 LAB — CBC WITH DIFFERENTIAL/PLATELET
Abs Immature Granulocytes: 0 10*3/uL (ref 0.00–0.07)
Basophils Absolute: 0 10*3/uL (ref 0.0–0.1)
Basophils Relative: 0 %
Eosinophils Absolute: 0.5 10*3/uL (ref 0.0–0.5)
Eosinophils Relative: 4 %
HCT: 25.4 % — ABNORMAL LOW (ref 36.0–46.0)
Hemoglobin: 8.2 g/dL — ABNORMAL LOW (ref 12.0–15.0)
Lymphocytes Relative: 8 %
Lymphs Abs: 1 10*3/uL (ref 0.7–4.0)
MCH: 31.7 pg (ref 26.0–34.0)
MCHC: 32.3 g/dL (ref 30.0–36.0)
MCV: 98.1 fL (ref 80.0–100.0)
Monocytes Absolute: 1.3 10*3/uL — ABNORMAL HIGH (ref 0.1–1.0)
Monocytes Relative: 10 %
Neutro Abs: 9.9 10*3/uL — ABNORMAL HIGH (ref 1.7–7.7)
Neutrophils Relative %: 78 %
Platelets: 207 10*3/uL (ref 150–400)
RBC: 2.59 MIL/uL — ABNORMAL LOW (ref 3.87–5.11)
RDW: 19.9 % — ABNORMAL HIGH (ref 11.5–15.5)
WBC: 12.7 10*3/uL — ABNORMAL HIGH (ref 4.0–10.5)
nRBC: 0 /100 WBC
nRBC: 0.6 % — ABNORMAL HIGH (ref 0.0–0.2)

## 2022-10-07 LAB — TYPE AND SCREEN
ABO/RH(D): O POS
Antibody Screen: NEGATIVE
Unit division: 0

## 2022-10-07 LAB — GLUCOSE, CAPILLARY
Glucose-Capillary: 168 mg/dL — ABNORMAL HIGH (ref 70–99)
Glucose-Capillary: 191 mg/dL — ABNORMAL HIGH (ref 70–99)
Glucose-Capillary: 139 mg/dL — ABNORMAL HIGH (ref 70–99)
Glucose-Capillary: 145 mg/dL — ABNORMAL HIGH (ref 70–99)
Glucose-Capillary: 133 mg/dL — ABNORMAL HIGH (ref 70–99)

## 2022-10-07 LAB — BPAM RBC
Blood Product Expiration Date: 202405262359
Unit Type and Rh: 9500

## 2022-10-07 NOTE — Evaluation (Signed)
Physical Therapy Assessment and Plan  Patient Details  Name: Amanda [name] MRN: [id number] Date of Birth: [birth year]  PT Diagnosis: Abnormal posture, Abnormality of gait, Difficulty walking, Edema, Impaired cognition, Muscle weakness, and Pain in L LE Rehab Potential: Poor ELOS: 2-2.5 weeks   Today's Date: 10/07/2022 PT Individual Time: 1305-1407 PT Individual Time Calculation (min): 62 min    Hospital Problem: Principal Problem:   Embolic stroke [facility]) Active Problems:   Lacunar infarction [facility])   Past Medical History:  Past Medical History:  Diagnosis Date   Carpal tunnel syndrome    Chronic anticoagulation    Chronic atrial fibrillation (HCC)    Diabetes mellitus    NON-INSULIN   Hypothyroidism    Sinus congestion    Past Surgical History:  Past Surgical History:  Procedure Laterality Date   APPENDECTOMY     CARDIOVASCULAR STRESS TEST  07/10/2004   EF 81%   CHOLECYSTECTOMY     INTRAMEDULLARY (IM) NAIL INTERTROCHANTERIC Left 10/01/2022   Procedure: INTRAMEDULLARY (IM) NAIL INTERTROCHANTERIC;  Surgeon: Tarry Kos, MD;  Location: [facility] OR;  Service: Orthopedics;  Laterality: Left;   US ECHOCARDIOGRAPHY  12/06/2007   EF 55-60%    Assessment & Plan Clinical Impression: Patient is a 89 y.o. year old female with history of atrial fibrillation maintained on Xarelto, diabetes mellitus, hypothyroidism. Per chart review patient lives alone. Split-level home, 3 stairs up/down stairs with 1 side railing. Used a rollator and a cane in the home.. Presented 09/29/2022 after mechanical fall when she missed 1 step landing on her left hip. No loss of consciousness. Cranial CT scan negative for acute changes. X-rays and imaging revealed displaced intertrochanteric fracture of the left hip. Admission chemistries unremarkable except sodium 132, glucose 246, hemoglobin 11.2. Underwent open treatment of intertrochanteric hip fracture with intramedullary implant 10/01/2022 per Dr. Roda Shutters.  Weightbearing as tolerated left lower extremity. Postoperative slurred speech and left-sided facial droop. MRI of the brain showed acute lacunar infarct in the bilateral corona radiata. MRA negative for large vessel occlusion. Carotid Dopplers with no ICA stenosis. Echocardiogram with ejection fraction of 60 to 65% no wall motion abnormalities. Neurology follow-up patient was cleared to resume Xarelto as prior to admission. She is currently maintained dysphagia #1 nectar thick liquid diet. Hospital course acute blood loss anemia 6.5 orders for transfusion 10/06/2022 of 1 unit packed red blood cells.. Therapy evaluations completed due to patient decreased functional mobility was admitted for a comprehensive rehab program. Patient transferred to CIR on 10/06/2022 .   Patient currently requires  +2 max/total assist  with mobility secondary to muscle weakness and muscle joint tightness, decreased cardiorespiratoy endurance and decreased oxygen support, impaired timing and sequencing, unbalanced muscle activation, and decreased motor planning,  , decreased attention, decreased awareness, decreased problem solving, and decreased safety awareness, and decreased sitting balance, decreased standing balance, decreased postural control, and decreased balance strategies.  Prior to hospitalization, patient was modified independent  with mobility and lived with Alone in a House home.  Home access is 3Stairs to enter.  Patient will benefit from skilled PT intervention to maximize safe functional mobility, minimize fall risk, and decrease caregiver burden for planned discharge home with 24 hour assist.  Anticipate patient will benefit from follow up [facility] at discharge.  PT - End of Session Activity Tolerance: Tolerates 30+ min activity with multiple rests Endurance Deficit: Yes Endurance Deficit Description: significant limitations due to pain and impaired endurance PT Assessment Rehab Potential (ACUTE/IP ONLY): Poor PT  Barriers to Discharge:  Inaccessible home environment;Home environment access/layout PT Barriers to Discharge Comments: split level home PT Patient demonstrates impairments in the following area(s): Balance;Perception;Behavior;Edema;Sensory;Skin Integrity;Endurance;Motor;Nutrition;Pain;Safety PT Transfers Functional Problem(s): Bed Mobility;Bed to Chair;Car;Furniture PT Locomotion Functional Problem(s): Ambulation;Wheelchair Mobility;Stairs PT Plan PT Intensity: Minimum of 1-2 x/day ,45 to 90 minutes PT Frequency: 5 out of 7 days PT Duration Estimated Length of Stay: 2-2.5 weeks PT Treatment/Interventions: Ambulation/gait training;Community reintegration;DME/adaptive equipment instruction;Neuromuscular re-education;Psychosocial support;Stair training;UE/LE Strength taining/ROM;Wheelchair propulsion/positioning;Balance/vestibular training;Discharge planning;Pain management;Skin care/wound management;Therapeutic Activities;UE/LE Coordination activities;Cognitive remediation/compensation;Disease management/prevention;Functional mobility training;Patient/family education;Splinting/orthotics;Therapeutic Exercise PT Transfers Anticipated Outcome(s): min assist using LRAD PT Locomotion Anticipated Outcome(s): mod assist using LRAD PT Recommendation Follow Up Recommendations: Home health PT;24 hour supervision/assistance Patient destination: Home Equipment Recommended: To be determined   PT Evaluation Precautions/Restrictions Precautions Precautions: Fall Restrictions Weight Bearing Restrictions: Yes RLE Weight Bearing: Weight bearing as tolerated LLE Weight Bearing: Weight bearing as tolerated Pain Pain Assessment Pain Scale: Faces Pain Score: 0-No pain Faces Pain Scale: Hurts whole lot Pain Type: Surgical pain Pain Location: Hip Pain Orientation: Left Pain Descriptors / Indicators: Aching;Grimacing;Guarding;Discomfort Pain Onset: With Activity (increases when sitting upright) Pain  Intervention(s): Medication (See eMAR);RN made aware;Repositioned;Distraction;Emotional support;Elevated extremity Multiple Pain Sites: No Pain Interference Pain Interference Pain Effect on Sleep: 3. Frequently Pain Interference with Therapy Activities: 3. Frequently;4. Almost constantly Pain Interference with Day-to-Day Activities: 4. Almost constantly Home Living/Prior Functioning Home Living Available Help at Discharge: Family;Available 24 hours/day (family reports they would hire 24hr caregivers if needed) Type of Home: House Home Access: Stairs to enter Secretary/administrator of Steps: 3 Entrance Stairs-Rails: Left Home Layout: Multi-level Alternate Level Stairs-Number of Steps: split level house with 6-7steps between each floor with only 1 HR - pt would enter on main floor with living room, kitchen, and den but NO bathroom and NO bedroom (bedroom is upstairs and walk-in shower downstairs otherwise has tub-shower upstairs) Alternate Level Stairs-Rails: Left Additional Comments: PTA pt lived alone in split level house mod-I using rollator in home and navigating up/down stairs  Lives With: Alone Prior Function Level of Independence: Independent with homemaking with ambulation;Requires assistive device for independence;Needs assistance with homemaking;Needs assistance with ADLs  Able to Take Stairs?: Yes Driving: No Vocation: Retired Optometrist - History Ability to See in Adequate Light: 0 Adequate Perception Perception: Within Functional Limits Praxis Praxis: Impaired Praxis Impairment Details: Motor planning Praxis-Other Comments: when experiencig pain her motor planning and awareness is impacted  Cognition Overall Cognitive Status: Difficult to assess (due to perseverating on pain and difficulty to understand) Arousal/Alertness: Lethargic Orientation Level: Oriented to person;Oriented to place;Oriented to situation Month: May Attention:  Focused;Sustained Focused Attention: Appears intact Sustained Attention: Impaired Awareness: Impaired Safety/Judgment: Impaired Comments: pt would scoot hips forward in w/c with decreased awareness of when she is getting close to the front edge Sensation Sensation Light Touch: Impaired Detail (will need to further assess) Hot/Cold: Not tested Proprioception: Impaired by gross assessment Stereognosis: Not tested Coordination Gross Motor Movements are Fluid and Coordinated: No Fine Motor Movements are Fluid and Coordinated: No Coordination and Movement Description: limited by pain and strength deficits Motor  Motor Motor: Other (comment);Abnormal postural alignment and control Motor - Skilled Clinical Observations: Limited by pain in L LE and decreased strength with generalized deconditioning   Trunk/Postural Assessment  Cervical Assessment Cervical Assessment: Exceptions to [facility] (mild forward head) Thoracic Assessment Thoracic Assessment: Exceptions to [facility] (mild rounded shoulders) Lumbar Assessment Lumbar Assessment: Exceptions to [facility] (posterior pelvic tilt, especially due to pain in L hip with seated  positioning) Postural Control Postural Control: Deficits on evaluation Balance Balance Balance Assessed: Yes Static Sitting Balance Static Sitting - Balance Support: Feet supported Static Sitting - Level of Assistance: 3: Mod assist;4: Min assist Dynamic Sitting Balance Dynamic Sitting - Balance Support: Feet supported Dynamic Sitting - Level of Assistance: 3: Mod assist;2: Max assist Static Standing Balance Static Standing - Balance Support: Bilateral upper extremity supported;During functional activity Static Standing - Level of Assistance: 2: Max assist Extremity Assessment  RLE Assessment RLE Assessment: Exceptions to [facility] Active Range of Motion (AROM) Comments: WFL for tasks assessed General Strength Comments: unable to formally assess due to pain perseverations making it  difficult to follow commands - at least 3+/5 demoed throughout during mobility LLE Assessment Active Range of Motion (AROM) Comments: decreased L knee flexion ROM and guarding with L hip flexion as well General Strength Comments: unable to formally assess due to pain perseverations making it difficult to follow commands - at least 2+/5 to 3/5 with pt having active momvement in all joints  Care Tool Care Tool Bed Mobility Roll left and right activity   Roll left and right assist level: Total Assistance - Patient < 25%    Sit to lying activity   Sit to lying assist level: Total Assistance - Patient < 25%    Lying to sitting on side of bed activity   Lying to sitting on side of bed assist level: the ability to move from lying on the back to sitting on the side of the bed with no back support.: Total Assistance - Patient < 25%     Care Tool Transfers Sit to stand transfer   Sit to stand assist level: 2 Helpers Sit to stand assistive device: Walker  Chair/bed transfer   Chair/bed transfer assist level: 2 Helpers Chair/bed transfer assistive device: Museum/gallery exhibitions officer transfer   Assist Level: Total Assistance - Patient < 25%    Licensed conveyancer transfer activity did not occur: Safety/medical concerns        Care Tool Locomotion Ambulation Ambulation activity did not occur: Safety/medical concerns        Walk 10 feet activity Walk 10 feet activity did not occur: Safety/medical concerns       Walk 50 feet with 2 turns activity Walk 50 feet with 2 turns activity did not occur: Safety/medical concerns      Walk 150 feet activity Walk 150 feet activity did not occur: Safety/medical concerns      Walk 10 feet on uneven surfaces activity Walk 10 feet on uneven surfaces activity did not occur: Safety/medical concerns      Stairs Stair activity did not occur: Safety/medical concerns        Walk up/down 1 step activity Walk up/down 1 step or curb (drop down) activity did not occur:  Safety/medical concerns      Walk up/down 4 steps activity Walk up/down 4 steps activity did not occur: Safety/medical concerns      Walk up/down 12 steps activity Walk up/down 12 steps activity did not occur: Safety/medical concerns      Pick up small objects from floor Pick up small object from the floor (from standing position) activity did not occur: Safety/medical concerns      Wheelchair Is the patient using a wheelchair?: Yes Type of Wheelchair: Manual   Wheelchair assist level: Dependent - Patient 0%    Wheel 50 feet with 2 turns activity   Assist Level: Dependent - Patient 0%  Wheel 150 feet  activity   Assist Level: Dependent - Patient 0%    Refer to Care Plan for Long Term Goals  SHORT TERM GOAL WEEK 1 PT Short Term Goal 1 (Week 1): Pt will perform supine<>sit using bed features with mod assist PT Short Term Goal 2 (Week 1): Pt will perform sit<>stands using LRAD with mod assist PT Short Term Goal 3 (Week 1): Pt will perform bed<>chair transfers using LRAD with mod assist PT Short Term Goal 4 (Week 1): Pt will ambulate at least 95ft using LRAD with mod assist of 1 and +2 as needed for safety  Recommendations for other services: None   Skilled Therapeutic Intervention Pt received supine in bed asleep, resting with her son Kathlene November) present and pt's daughter Harriett Sine) arriving shortly after. Upon awakening, with encouragement from therapist and family, pt agreeable to participate in therapy session despite reports of being "exhausted." Evaluation completed (see details above) with patient education regarding purpose of PT evaluation, PT POC and goals, therapy schedule, weekly team meetings, and other CIR information including safety plan and fall risk safety. Pt performed the below functional mobility tasks with the specified levels of skilled cuing and assistance. Pt perseverating on increased pain in L hip - nurse notified and present for medication administration. Pt received  on 1L of O2 via nasal cannula with SpO2 monitored and noticed to decrease to mid 80s with mobility due to increased pain resulting in inconsistent breathing patterns - therapist cuing for slow, deep breathing for oxygenation and pain management during mobility tasks. Therapist notified MD of need for O2 order. During bed mobility, pt requires therapist to manually assist with L LE movements due to pain and guarding as well as using pad under pt's hips to provide total assist to scoot towards EOB. Immediately upon sitting upright, pt reports increased L hip pain with pt appearing not to tolerate sitting upright WBing through L hip, pt attempting to posteriorly pelvic tilt or tilt pelvis to decrease L hip WBing. Stand pivot transfers EOB<>w/c using RW with +2 mod progressed to +2 max assist for safety when transferring back towards L due to pt's L knee giving way requiring blocking and assist to pivot hips safely onto bed. While sitting in w/c, pt reports feeling that she does not want to continue living, stating she has lived enough years - therapist provides emotional support for pt and family during this time. At end of session, pt left supine in bed with needs in reach, lines intact, bed alarm on, and family present.  Mobility Bed Mobility Bed Mobility: Supine to Sit;Sit to Supine Rolling Right: Dependent - Patient equal 0% Rolling Left: Total Assistance - Patient < 25%;Dependent - Patient equal 0% Supine to Sit: Total Assistance - Patient < 25% (using bed features) Sit to Supine: Total Assistance - Patient < 25% (using bed features) Transfers Transfers: Sit to Stand;Stand to Sit;Stand Pivot Transfers Sit to Stand: Maximal Assistance - Patient 25-49% Stand to Sit: Maximal Assistance - Patient 25-49% Stand Pivot Transfers: 2 Helpers;Maximal Assistance - Patient 25 - 49% (pt's L knee started to buckle during transfer back to bed towards L) Stand Pivot Transfer Details: Tactile cues for  sequencing;Tactile cues for weight shifting;Verbal cues for precautions/safety;Verbal cues for technique;Verbal cues for sequencing;Verbal cues for gait pattern;Manual facilitation for weight shifting;Verbal cues for safe use of DME/AE;Tactile cues for posture;Visual cues for safe use of DME/AE;Visual cues/gestures for sequencing Transfer (Assistive device): Rolling walker Locomotion  Gait Ambulation: No Gait Gait: No Stairs / Additional  Locomotion Stairs: No Corporate treasurer: Yes Wheelchair Assistance: Dependent - Patient 0% Wheelchair Parts Management: Needs assistance Distance: 156ft   Discharge Criteria: Patient will be discharged from PT if patient refuses treatment 3 consecutive times without medical reason, if treatment goals not met, if there is a change in medical status, if patient makes no progress towards goals or if patient is discharged from hospital.  The above assessment, treatment plan, treatment alternatives and goals were discussed and mutually agreed upon: by patient and by family  Ginny Forth , PT, DPT, NCS, CSRS 10/07/2022, 6:54 PM

## 2022-10-07 NOTE — Discharge Instructions (Signed)
Inpatient Rehab Discharge Instructions  Amanda [name] Discharge date and time: No discharge date for patient encounter.   Activities/Precautions/ Functional Status: Activity: activity as tolerated Diet:  Wound Care: Routine skin checks Functional status:  ___ No restrictions     ___ Walk up steps independently ___ 24/7 supervision/assistance   ___ Walk up steps with assistance ___ Intermittent supervision/assistance  ___ Bathe/dress independently ___ Walk with walker     _x__ Bathe/dress with assistance ___ Walk Independently    ___ Shower independently ___ Walk with assistance    ___ Shower with assistance ___ No alcohol     ___ Return to work/school ________  Special Instructions: No driving smoking or alcoholSTROKE/TIA DISCHARGE INSTRUCTIONS SMOKING Cigarette smoking nearly doubles your risk of having a stroke & is the single most alterable risk factor  If you smoke or have smoked in the last 12 months, you are advised to quit smoking for your health. Most of the excess cardiovascular risk related to smoking disappears within a year of stopping. Ask you doctor about anti-smoking medications [place] Quit Line: 1-800-QUIT NOW Free Smoking Cessation Classes [phone]  CHOLESTEROL Know your levels; limit fat & cholesterol in your diet  Lipid Panel     Component Value Date/Time   CHOL 134 10/04/2022 0241   TRIG 91 10/04/2022 0241   HDL 48 10/04/2022 0241   CHOLHDL 2.8 10/04/2022 0241   VLDL 18 10/04/2022 0241   LDLCALC 68 10/04/2022 0241     Many patients benefit from treatment even if their cholesterol is at goal. Goal: Total Cholesterol (CHOL) less than 160 Goal:  Triglycerides (TRIG) less than 150 Goal:  HDL greater than 40 Goal:  LDL (LDLCALC) less than 100   BLOOD PRESSURE American Stroke Association blood pressure target is less that 120/80 mm/Hg  Your discharge blood pressure is:  BP: 119/84 Monitor your blood pressure Limit your salt and alcohol intake Many  individuals will require more than one medication for high blood pressure  DIABETES (A1c is a blood sugar average for last 3 months) Goal HGBA1c is under 7% (HBGA1c is blood sugar average for last 3 months)  Diabetes:   Lab Results  Component Value Date   HGBA1C 5.7 (H) 03/18/2022    Your HGBA1c can be lowered with medications, healthy diet, and exercise. Check your blood sugar as directed by your physician Call your physician if you experience unexplained or low blood sugars.  PHYSICAL ACTIVITY/REHABILITATION Goal is 30 minutes at least 4 days per week  Activity: Increase activity slowly, Therapies: Physical Therapy: Home Health Return to work:  Activity decreases your risk of heart attack and stroke and makes your heart stronger.  It helps control your weight and blood pressure; helps you relax and can improve your mood. Participate in a regular exercise program. Talk with your doctor about the best form of exercise for you (dancing, walking, swimming, cycling).  DIET/WEIGHT Goal is to maintain a healthy weight  Your discharge diet is:  Diet Order             DIET - DYS 1 Room service appropriate? No; Fluid consistency: Nectar Thick  Diet effective now                   liquids Your height is:  Height: 5\' 3"  (160 cm) Your current weight is: Weight: 79.4 kg Your Body Mass Index (BMI) is:  BMI (Calculated): 31.02 Following the type of diet specifically designed for you will help prevent another  stroke. Your goal weight range is:   Your goal Body Mass Index (BMI) is 19-24. Healthy food habits can help reduce 3 risk factors for stroke:  High cholesterol, hypertension, and excess weight.  RESOURCES Stroke/Support Group:  Call [phone]   STROKE EDUCATION PROVIDED/REVIEWED AND GIVEN TO PATIENT Stroke warning signs and symptoms How to activate emergency medical system (call 911). Medications prescribed at discharge. Need for follow-up after discharge. Personal risk factors  for stroke. Pneumonia vaccine given: No Flu vaccine given: No My questions have been answered, the writing is legible, and I understand these instructions.  I will adhere to these goals & educational materials that have been provided to me after my discharge from the hospital.      My questions have been answered and I understand these instructions. I will adhere to these goals and the provided educational materials after my discharge from the hospital.  Patient/Caregiver Signature _______________________________ Date __________  Clinician Signature _______________________________________ Date __________  Please bring this form and your medication list with you to all your follow-up doctor's appointments.

## 2022-10-07 NOTE — Progress Notes (Signed)
Inpatient Rehabilitation Care Coordinator Assessment and Plan Patient Details  Name: Amanda [name] MRN: [id number] Date of Birth: 02-Mar-1919  Today's Date: 10/07/2022  Hospital Problems: Principal Problem:   Embolic stroke [facility]) Active Problems:   Lacunar infarction [facility])  Past Medical History:  Past Medical History:  Diagnosis Date   Carpal tunnel syndrome    Chronic anticoagulation    Chronic atrial fibrillation (HCC)    Diabetes mellitus    NON-INSULIN   Hypothyroidism    Sinus congestion    Past Surgical History:  Past Surgical History:  Procedure Laterality Date   APPENDECTOMY     CARDIOVASCULAR STRESS TEST  07/10/2004   EF 81%   CHOLECYSTECTOMY     INTRAMEDULLARY (IM) NAIL INTERTROCHANTERIC Left 10/01/2022   Procedure: INTRAMEDULLARY (IM) NAIL INTERTROCHANTERIC;  Surgeon: Tarry Kos, MD;  Location: [facility] OR;  Service: Orthopedics;  Laterality: Left;   US ECHOCARDIOGRAPHY  12/06/2007   EF 55-60%   Social History:  reports that she has never smoked. She has never used smokeless tobacco. She reports that she does not drink alcohol and does not use drugs.  Family / Support Systems Marital Status: Married Patient Roles: Parent Spouse/Significant Other: n/a Children: August Saucer (daughter), Harriett Sine Sutphin (Dtr), Doree Barthel (Son), Melvern Banker (Son) Other Supports: Clifton Custard Anticipated Caregiver: Family and hired assistance Ability/Limitations of Caregiver: Diane( Dtr and POA- only able to prvide supervision) & Kathlene November, Son (only able to provide PRN supervision)- primary contacts Caregiver Availability: 24/7 Family Dynamics: support from family, children primarily  Social History Preferred language: English Religion: Protestant Cultural Background: Pt independent Education: [employer] Literacy - How often do you need to have someone help you when you read instructions, pamphlets, or other written material from your doctor or pharmacy?: Always (son and  bedside) Writes: Yes Legal History/Current Legal Issues: N/A Guardian/Conservator: August Saucer   Abuse/Neglect Abuse/Neglect Assessment Can Be Completed: Yes Physical Abuse: Denies Verbal Abuse: Denies Sexual Abuse: Denies Exploitation of patient/patient's resources: Denies Self-Neglect: Denies  Patient response to: Social Isolation - How often do you feel lonely or isolated from those around you?: Never  Emotional Status Pt's affect, behavior and adjustment status: Pleasant Recent Psychosocial Issues: coping Psychiatric History: n/a Substance Abuse History: N/A  Patient / Family Perceptions, Expectations & Goals Pt/Family understanding of illness & functional limitations: son at bedside to assist Premorbid pt/family roles/activities: Previously MOD I with RW & HC to assist 1x a week for showers. Anticipated changes in roles/activities/participation: Children plan to d/c home with children to provide supervision and hire [facility] to provide care and assist. Children provided meals. Pt/family expectations/goals: Supervision  [employer]: Other (Comment) (Home care) Premorbid Home Care/DME Agencies: Other (Comment) (RW, rollator, BSC, SPC) Transportation available at discharge: children able to transport Is the patient able to respond to transportation needs?: Yes (per pt and son) In the past 12 months, has lack of transportation kept you from medical appointments or from getting medications?: No In the past 12 months, has lack of transportation kept you from meetings, work, or from getting things needed for daily living?: No Resource referrals recommended: Neuropsychology  Discharge Planning Living Arrangements: Alone Support Systems: Children Type of Residence: Private residence (3-4 Steps with 1 side railing) Insurance Resources: Electrical engineer Resources: Family Support Financial Screen Referred: No Living Expenses: Own Money Management:  Patient, Family Does the patient have any problems obtaining your medications?: No Home Management: Independent Patient/Family Preliminary Plans: Family plans to assist and hire HC. Care Coordinator  Barriers to Discharge: Decreased caregiver support, Lack of/limited family support Care Coordinator Anticipated Follow Up Needs: HH/OP Expected length of stay: 10-14 Days  Clinical Impression Sw met with pt and son in room, introduced self and explained role. Pt will discharge home with [facility] services and family to provide 24/7 supervision. Pt previously independent with rollator and SPC with a caregiver coming in 1x a week to assist with showers. Pt currently has a RW, rollator, BSC and SPC. Daughter able to provide supervision only and son able to provide PRN supervision. 1 level home with 3-4 steps to enter. No additional questions or concerns. SW will provide conference updates tomorrow between 12 PM- 1PM.   Andria Rhein 10/07/2022, 1:22 PM

## 2022-10-07 NOTE — Plan of Care (Signed)
  Problem: RH Balance Goal: LTG: Patient will maintain dynamic sitting balance (OT) Description: LTG:  Patient will maintain dynamic sitting balance with assistance during activities of daily living (OT) Flowsheets (Taken 10/07/2022 1653) LTG: Pt will maintain dynamic sitting balance during ADLs with: Contact Guard/Touching assist   Problem: Sit to Stand Goal: LTG:  Patient will perform sit to stand in prep for activites of daily living with assistance level (OT) Description: LTG:  Patient will perform sit to stand in prep for activites of daily living with assistance level (OT) Flowsheets (Taken 10/07/2022 1653) LTG: PT will perform sit to stand in prep for activites of daily living with assistance level: Minimal Assistance - Patient > 75%   Problem: RH Grooming Goal: LTG Patient will perform grooming w/assist,cues/equip (OT) Description: LTG: Patient will perform grooming with assist, with/without cues using equipment (OT) Flowsheets (Taken 10/07/2022 1653) LTG: Pt will perform grooming with assistance level of: Minimal Assistance - Patient > 75%   Problem: RH Bathing Goal: LTG Patient will bathe all body parts with assist levels (OT) Description: LTG: Patient will bathe all body parts with assist levels (OT) Flowsheets (Taken 10/07/2022 1653) LTG: Pt will perform bathing with assistance level/cueing: Moderate Assistance - Patient 50 - 74%   Problem: RH Dressing Goal: LTG Patient will perform upper body dressing (OT) Description: LTG Patient will perform upper body dressing with assist, with/without cues (OT). Flowsheets (Taken 10/07/2022 1653) LTG: Pt will perform upper body dressing with assistance level of: Contact Guard/Touching assist Goal: LTG Patient will perform lower body dressing w/assist (OT) Description: LTG: Patient will perform lower body dressing with assist, with/without cues in positioning using equipment (OT) Flowsheets (Taken 10/07/2022 1653) LTG: Pt will perform lower  body dressing with assistance level of: Minimal Assistance - Patient > 75%   Problem: RH Toileting Goal: LTG Patient will perform toileting task (3/3 steps) with assistance level (OT) Description: LTG: Patient will perform toileting task (3/3 steps) with assistance level (OT)  Flowsheets (Taken 10/07/2022 1653) LTG: Pt will perform toileting task (3/3 steps) with assistance level: Moderate Assistance - Patient 50 - 74%   Problem: RH Toilet Transfers Goal: LTG Patient will perform toilet transfers w/assist (OT) Description: LTG: Patient will perform toilet transfers with assist, with/without cues using equipment (OT) Flowsheets (Taken 10/07/2022 1653) LTG: Pt will perform toilet transfers with assistance level of: Minimal Assistance - Patient > 75%   Problem: RH Tub/Shower Transfers Goal: LTG Patient will perform tub/shower transfers w/assist (OT) Description: LTG: Patient will perform tub/shower transfers with assist, with/without cues using equipment (OT) Flowsheets (Taken 10/07/2022 1653) LTG: Pt will perform tub/shower stall transfers with assistance level of: Minimal Assistance - Patient > 75%

## 2022-10-07 NOTE — Plan of Care (Signed)
  Problem: RH Swallowing Goal: LTG Patient will consume least restrictive diet using compensatory strategies with assistance (SLP) Description: LTG:  Patient will consume least restrictive diet using compensatory strategies with assistance (SLP) Flowsheets (Taken 10/07/2022 1628) LTG: Pt Patient will consume least restrictive diet using compensatory strategies with assistance of (SLP): Moderate Assistance - Patient 50 - 74% Goal: LTG Patient will participate in dysphagia therapy to increase swallow function with assistance (SLP) Description: LTG:  Patient will participate in dysphagia therapy to increase swallow function with assistance (SLP) Flowsheets (Taken 10/07/2022 1628) LTG: Pt will participate in dysphagia therapy to increase swallow function with assistance of (SLP): Moderate Assistance - Patient 50 - 74% Goal: LTG Pt will demonstrate functional change in swallow as evidenced by bedside/clinical objective assessment (SLP) Description: LTG: Patient will demonstrate functional change in swallow as evidenced by bedside/clinical objective assessment (SLP) Flowsheets (Taken 10/07/2022 1628) LTG: Patient will demonstrate functional change in swallow as evidenced by bedside/clinical objective assessment: Oropharyngeal swallow   Problem: RH Expression Communication Goal: LTG Patient will increase speech intelligibility (SLP) Description: LTG: Patient will increase speech intelligibility at word/phrase/conversation level with cues, % of the time (SLP) Flowsheets (Taken 10/07/2022 1628) LTG: Patient will increase speech intelligibility (SLP): Minimal Assistance - Patient > 75% Level: Phrase

## 2022-10-07 NOTE — Progress Notes (Signed)
Patient ID: Amanda [name], female   DOB: 04-17-1919, 89 y.o.   MRN: [id number] Met with the patient, and family to review current situation, rehab process, team conference and plan of care. Reviewed lacunar stroke with Orlene Plum for A-fib, Reviewed secondary risks including HLD, on crestor, NIDDM (A1C 6.4) monitoring CBGs and medications prescribed. Reviewed diet restrictions and follow up by SLP; reported patient eating wide variety of foods PTA, checked her CBGs daily, family brought in food to be heated in a microwave and living conditions PTA; weaning off O2. Continue to follow along to address educational needs to facilitate preparation for discharge. Pamelia Hoit

## 2022-10-07 NOTE — Progress Notes (Signed)
Inpatient Rehabilitation Admission Medication Review by a Pharmacist  A complete drug regimen review was completed for this patient to identify any potential clinically significant medication issues.  High Risk Drug Classes Is patient taking? Indication by Medication  Antipsychotic Yes PRN prochlorperazine - nausea/vomiting  Anticoagulant Yes Rivaroxaban - atrial fibrillation and CVA  Antibiotic No   Opioid Yes Oxycodone - severe pain  Antiplatelet No   Hypoglycemics/insulin Yes SSI - glucose control  Vasoactive Medication No   Chemotherapy No   Other Yes Docusate - laxative Levothyroxine - supplement Loratadine - allergies/ rhinitis Ropinirole - restless leg Rosuvastatin - lipid management  PRNs: Acetaminophen - mild pain Maalox - indigestion Diphenhydramine - itching Methocarbamol - muscle spasms Trazodone - sleep Saline nasal spray - nasal congestion Senokot S, bisacodyl, fleets enema - constipation     Type of Medication Issue Identified Description of Issue Recommendation(s)  Drug Interaction(s) (clinically significant)     Duplicate Therapy     Allergy     No Medication Administration End Date     Incorrect Dose     Additional Drug Therapy Needed     Significant med changes from prior encounter (inform family/care partners about these prior to discharge). Holding prior Lisinopril and Glimipiride.  Rosuvastatin added while inpatient. rior prn Percocet, now prn Oxycodone Ropinirole dose decreased BID inpatient> qhs on CIR.  Off Ferrous sulfate, Lasix daily prn edema, fish oil, Vitamin D, Vitamin C, niacin, MagOx, Perservsion AREDS 2, probiotic, benefiber; prn meclizine for dizziness, prn Arnicare Arnica cream, prn glycerin suppository, prn Percocet. To resume if/when clinically indicated.  Communicate changes with patient/family. Requip outpatient Rx is for qhs dosing.   Resume if/when indicated or at discharge.  Other         Clinically significant  medication issues were identified that warrant physician communication and completion of prescribed/recommended actions by midnight of the next day:  No  Time spent performing this drug regimen review (minutes):  [address]   Dennie Fetters, [place] 10/07/2022 8:29 AM

## 2022-10-07 NOTE — Evaluation (Signed)
Occupational Therapy Assessment and Plan  Patient Details  Name: Amanda [name] MRN: [id number] Date of Birth: 1918/12/21  OT Diagnosis: acute pain and muscle weakness (generalized) Rehab Potential: Rehab Potential (ACUTE ONLY): Fair ELOS: 2-2.5 weeks   Today's Date: 10/07/2022 OT Individual Time: 4098-1191 OT Individual Time Calculation (min): 75 min    OT Individual Time: 1300-1333 OT Individual Time Calculation (min): 33 min     Hospital Problem: Principal Problem:   Embolic stroke [facility]) Active Problems:   Lacunar infarction [facility])   Past Medical History:  Past Medical History:  Diagnosis Date   Carpal tunnel syndrome    Chronic anticoagulation    Chronic atrial fibrillation (HCC)    Diabetes mellitus    NON-INSULIN   Hypothyroidism    Sinus congestion    Past Surgical History:  Past Surgical History:  Procedure Laterality Date   APPENDECTOMY     CARDIOVASCULAR STRESS TEST  07/10/2004   EF 81%   CHOLECYSTECTOMY     INTRAMEDULLARY (IM) NAIL INTERTROCHANTERIC Left 10/01/2022   Procedure: INTRAMEDULLARY (IM) NAIL INTERTROCHANTERIC;  Surgeon: Tarry Kos, MD;  Location: [facility] OR;  Service: Orthopedics;  Laterality: Left;   US ECHOCARDIOGRAPHY  12/06/2007   EF 55-60%    Assessment & Plan Clinical Impression: Roxan Ragucci with history of atrial fibrillation maintained on Xarelto, diabetes mellitus, hypothyroidism. Per chart review patient lives alone. Split-level home, 3 stairs up/down stairs with 1 side railing. Used a rollator and a cane in the home.. Presented 09/29/2022 after mechanical fall when she missed 1 step landing on her left hip. No loss of consciousness. Cranial CT scan negative for acute changes. X-rays and imaging revealed displaced intertrochanteric fracture of the left hip. Admission chemistries unremarkable except sodium 132, glucose 246, hemoglobin 11.2. Underwent open treatment of intertrochanteric hip fracture with intramedullary implant 10/01/2022 per  Dr. Roda Shutters. Weightbearing as tolerated left lower extremity. Postoperative slurred speech and left-sided facial droop. MRI of the brain showed acute lacunar infarct in the bilateral corona radiata. MRA negative for large vessel occlusion. Carotid Dopplers with no ICA stenosis. Echocardiogram with ejection fraction of 60 to 65% no wall motion abnormalities. Neurology follow-up patient was cleared to resume Xarelto as prior to admission. She is currently maintained dysphagia #1 nectar thick liquid diet. Hospital course acute blood loss anemia 6.5 orders for transfusion 10/06/2022 of 1 unit packed red blood cells. Patient transferred to CIR on 10/06/2022 .    Patient currently requires max-total A with basic self-care skills and IADL secondary to muscle weakness, decreased cardiorespiratoy endurance, decreased awareness, decreased safety awareness, and decreased memory, central origin, and decreased sitting balance, decreased standing balance, decreased postural control, and decreased balance strategies.  Prior to hospitalization, patient could complete BADLs and light IADLs with modified independent with assistance provided for bathing, cooking, and laundry.   Patient will benefit from skilled intervention to decrease level of assist with basic self-care skills, increase independence with basic self-care skills, and increase level of independence with iADL prior to discharge home with care partner.  Anticipate patient will require 24 hour supervision and moderate physical assestance and follow up home health.  OT - End of Session Activity Tolerance: Decreased this session Endurance Deficit: Yes OT Assessment Rehab Potential (ACUTE ONLY): Fair OT Patient demonstrates impairments in the following area(s): Balance;Safety;Sensory;Edema;Skin Integrity;Endurance;Motor;Pain;Nutrition OT Basic ADL's Functional Problem(s): Eating;Grooming;Bathing;Dressing;Toileting OT Advanced ADL's Functional Problem(s): Simple Meal  Preparation OT Transfers Functional Problem(s): Tub/Shower;Toilet OT Plan OT Intensity: Minimum of 1-2 x/day, 45 to 90 minutes OT  Frequency: 5 out of 7 days OT Duration/Estimated Length of Stay: 2-2.5 weeks OT Treatment/Interventions: Discharge planning;Balance/vestibular training;Pain management;Self Care/advanced ADL retraining;Therapeutic Activities;UE/LE Coordination activities;Cognitive remediation/compensation;Disease mangement/prevention;Functional mobility training;Patient/family education;Skin care/wound managment;Therapeutic Exercise;Visual/perceptual remediation/compensation;Community reintegration;DME/adaptive equipment instruction;Neuromuscular re-education;Psychosocial support;Splinting/orthotics;UE/LE Strength taining/ROM;Wheelchair propulsion/positioning OT Self Feeding Anticipated Outcome(s): Set-up OT Basic Self-Care Anticipated Outcome(s): Min A OT Toileting Anticipated Outcome(s): Min A OT Bathroom Transfers Anticipated Outcome(s): Min A OT Recommendation Patient destination: Home Follow Up Recommendations: Home health OT Equipment Recommended: To be determined   OT Evaluation Precautions/Restrictions  Precautions Precautions: Fall Restrictions Weight Bearing Restrictions: Yes RLE Weight Bearing: Weight bearing as tolerated LLE Weight Bearing: Weight bearing as tolerated General Chart Reviewed: Yes Additional Pertinent History: >2 falls past two months, A-fib, DM, hypothyroidism Family/Caregiver Present: Yes (DTR- Nancy and Son-Mike)   Pain Pain Assessment Pain Scale: 0-10 Pain Score: 0-No pain Faces Pain Scale: Hurts whole lot Pain Type: Surgical pain Pain Location: Hip Pain Orientation: Left Pain Descriptors / Indicators: Aching;Discomfort Pain Frequency: Constant Pain Onset: On-going Pain Intervention(s): Medication (See eMAR) Home Living/Prior Functioning Home Living Living Arrangements: Alone Available Help at Discharge: Family, Available 24  hours/day Type of Home: House Home Access: Stairs to enter [employer] of Steps: 1 (3") + 4 Entrance Stairs-Rails: Left Home Layout: Other (Comment) (split level, no bathroom on main level) Alternate Level Stairs-Rails: Right Bathroom Shower/Tub: Tub/shower unit, Health visitor: Standard Bathroom Accessibility: Yes Additional Comments: Pt lives alone in split level house, 3 stairs up/down stairs with one side railing. Pt uses rollator and cane in home.  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 [employer]: Independent with homemaking with ambulation, Requires assistive device for independence, Needs assistance with homemaking, Needs assistance with ADLs Bath: Moderate Laundry: Moderate  Able to Take Stairs?: Yes Driving: No Vocation: Retired Administrator, sports Baseline Vision/History: 1 Wears glasses Ability to See in Adequate Light: 0 Adequate Patient Visual Report: No change from baseline Vision Assessment?: Yes Eye Alignment: Within Functional Limits Ocular Range of Motion: Within Functional Limits Alignment/Gaze Preference: Within Defined Limits Tracking/Visual Pursuits: Able to track stimulus in all quads without difficulty Saccades: Decreased speed of saccadic movement Convergence: Within functional limits (no double vision reported) Visual Fields: No apparent deficits Perception  Perception: Within Functional Limits Praxis Praxis: Intact Cognition Cognition Overall Cognitive Status: Difficult to assess Arousal/Alertness: Lethargic Memory: Appears intact Attention: Focused Focused  Attention: Impaired Awareness: Impaired Awareness Impairment: Intellectual impairment Problem Solving:  (dificult to assess d/t lethargy) Safety/Judgment: Other (comment) (difficult to assess d/t lethargy) Brief Interview for Mental Status (BIMS) Repetition of Three Words (First Attempt): 3 Temporal Orientation: Year: Correct Temporal Orientation: Month: Accurate within 5 days Temporal Orientation: Day: Correct Recall: "Sock": No, could not recall Recall: "Blue": No, could not recall Recall: "Bed": Yes, no cue required BIMS Summary Score: 11 Sensation Sensation Light Touch: Impaired by gross assessment (carpal tunnel and decreased sensation in BUEs present at baseline) Hot/Cold: Not tested Proprioception: Appears Intact Stereognosis: Not tested Coordination Gross Motor Movements are Fluid and Coordinated: No Fine Motor Movements are Fluid and Coordinated: No Coordination and Movement Description: limited by pain and strength deficits Finger Nose Finger Test: Pt presenting with slowed motor movements Motor  Motor Motor - Skilled Clinical Observations: Limited by pain and decreased strength in BLEs  Trunk/Postural Assessment  Cervical Assessment Cervical Assessment: Exceptions to [facility] (mild forward head) Thoracic Assessment Thoracic Assessment: Exceptions to [facility] (midl rounded shoudlers) Lumbar Assessment Lumbar Assessment: Exceptions to [facility] (posterior pelvic tilt) Postural Control Postural Control: Deficits on evaluation Trunk Control: deficit- assistance required during static sitting EOB mod A Protective Responses: delayed  Balance Balance Balance Assessed: Yes Static Sitting Balance Static Sitting - Balance Support: Feet supported Static Sitting - Level of Assistance: 3: Mod assist;4: Min assist Dynamic Sitting Balance Dynamic Sitting - Balance Support: Feet supported Dynamic Sitting - Level of Assistance: 3: Mod assist Static Standing Balance Static Standing -  Balance Support: Bilateral upper extremity supported Static Standing - Level of Assistance: 2: Max assist;3: Mod assist Extremity/Trunk Assessment RUE Assessment RUE Assessment: Exceptions to [facility] General Strength Comments: 3+/5 LUE Assessment LUE Assessment: Exceptions to [facility] General Strength Comments: 3+/5  Care Tool Care Tool Self Care Eating   Eating Assist Level: Moderate Assistance - Patient 50 - 74%    Oral Care  Oral care, brush teeth, clean dentures activity did not occur: Refused      Bathing   Body parts bathed by patient: Face;Left arm;Right arm;Chest Body parts bathed by helper: Abdomen;Front perineal area;Buttocks;Right upper leg;Left upper leg;Left lower leg;Right lower leg   Assist Level: Maximal Assistance - Patient 24 - 49%    Upper Body Dressing(including orthotics)   What is the patient wearing?: Dress   Assist Level: Maximal Assistance - Patient 25 - 49%    Lower Body Dressing (excluding footwear)   What is the patient wearing?: Underwear/pull up Assist for lower body dressing: Dependent - Patient 0%    Putting on/Taking off footwear   What is the patient wearing?: Socks;Shoes Assist for footwear: Dependent - Patient 0%       Care Tool Toileting Toileting activity   Assist for toileting: Dependent - Patient 0%     Care Tool Bed Mobility Roll left and right activity   Roll left and right assist level: Total Assistance - Patient < 25%    Sit to lying activity   Sit to lying assist level: Total Assistance - Patient < 25%    Lying to sitting on side of bed activity   Lying to sitting on side of bed assist level: the ability to move from lying on the back to sitting on the side of the bed with no back support.: 2 Helpers     Care Tool Transfers Sit to stand transfer   Sit to stand assist level: Maximal Assistance - Patient 25 - 49%    Chair/bed transfer         Toilet transfer   Assist Level: Total Assistance - Patient < 25%     Care  Tool Cognition  Expression of Ideas and Wants Expression of Ideas and Wants: 3. Some difficulty - exhibits some difficulty with expressing needs and ideas (e.g, some words or finishing thoughts) or speech is not clear  Understanding Verbal and Non-Verbal Content Understanding Verbal and Non-Verbal Content: 3. Usually understands - understands most conversations, but misses some part/intent of message. Requires cues at times to understand   Memory/Recall Ability Memory/Recall Ability : That he or she is in a hospital/hospital unit   Refer to Care Plan for Long Term Goals  SHORT TERM GOAL WEEK 1 OT Short Term Goal 1 (Week 1): Pt will self feed 50% of meal wih supervision OT Short Term Goal 2 (Week 1): Pt will maintain sitting balance during BADLs min A  OT Short Term Goal 3 (Week 1): Pt will complete sit>stands with mod A x1 using LRAD  Recommendations for other services: None    Skilled Therapeutic Intervention Skilled Therapeutic Interventions/Progress Updates:  AM Session: 1:1 OT evaluation and intervention initiated with skilled education provided on OT role, goals, and POC. Pt presenting to be lethargic upon OT arrival, however receptive to skilled OT session. Pt's DTR- Harriett Sine, present in room upon OT arrival and throughout evaluation. Pt's son- Kathlene November present at end of session. Obtained PLOF information from family d/t Pt's lethargy ad continued presentation of dysarthria impacting her speech intelligibility. Pt reporting un-rated pain at beginning of session with pain medications provided prior to OT arrival with OT providing therapeutic support, repositioning, and encouragement- increased pain in sitting position noted. Pt on 1L O2 upon OT arrival. Spoke with MD and received verbal permission to doff O2 briefly to assess change in O2. On 1L O2- PSO2 97-100%; on RA O2-84-90% at rest with education provided on PLB'ing, however unable to get O2 above 90% on RA. 1L O2 donned for remainder of session.  Pt presenting with pain and fatigue limiting her ability to participate in BADLs. Pt transitioned to EOB total A +time and requiring mod A to maintain sitting balance EOB. Pt able to wash her face and BUEs seated EOB with OT providing trunk support during task. Pt donned gown max A and brief total A. Pt requesting to use bathroom. Sit>stand using RW max A. Attempted to complete stand pivot to [facility], however Pt unable to complete d/t pain and decreased LLE strength, unable to initiate step. Seated rest break provided. Completed second sit>stand with max A and maintained stand while using female urinal with max A and back of BLEs positioned against bed for support. Pt requesting to return to bed requiring total A for EOB>supine. Pt left resting in bed at end of session with call bell in reach, family present in room, all needs met, and bed alarm on.  PM Session:  Pt received supine in bed with son and DTR present in room. Pt presenting to be lethargic and requesting to remain in bed. Offered bed level exercises or sitting EOB, however Pt refusing stating "I am just ready to die". Provided therapeutic support, encouragement, and education on services available to family such as chaplain or neuropsychologist, however Pt's family politely refusing seeking additional services at this time. Focused session on family education and d/c planning. Educated on options for d/c including hospice care, SNF, or increased family support at home with Pt's family reporting they are planning to meet this afternoon to discuss options with other siblings. Education provided on home modifications and needed physical support required if Pt plans to d/c home. Both family members receptive to education and planning to inform medical team of their plans in the coming days. Pt left resting in bed with call bell  in reach, bed alarm on, family present in room, and all needs met.  ADL ADL Eating: Moderate assistance Where Assessed-Eating:  Bed level Grooming: Maximal assistance Where Assessed-Grooming: Bed level Upper Body Bathing: Moderate assistance Where Assessed-Upper Body Bathing: Edge of bed Lower Body Bathing: Dependent Where Assessed-Lower Body Bathing: Edge of bed Upper Body Dressing: Moderate assistance Where Assessed-Upper Body Dressing: Edge of bed Lower Body Dressing: Dependent Where Assessed-Lower Body Dressing: Edge of bed Toileting: Dependent Where Assessed-Toileting: Other (Comment) (female urinal) Toilet Transfer: Dependent (unable to complete stand step transfer to [facility]) Toilet Transfer Method: Stand pivot Toilet Transfer Equipment: Bedside  commode Tub/Shower Transfer: Not assessed Film/video editor: Not assessed Mobility  Bed Mobility Bed Mobility: Rolling Right;Rolling Left;Supine to Sit;Sit to Supine Rolling Right: Dependent - Patient equal 0% Rolling Left: Total Assistance - Patient < 25%;Dependent - Patient equal 0% Supine to Sit: Dependent - Patient equal 0% Sit to Supine: Dependent - Patient equal 0% Transfers Sit to Stand: Maximal Assistance - Patient 25-49% Stand to Sit: Maximal Assistance - Patient 25-49%   Discharge Criteria: Patient will be discharged from OT if patient refuses treatment 3 consecutive times without medical reason, if treatment goals not met, if there is a change in medical status, if patient makes no progress towards goals or if patient is discharged from hospital.  The above assessment, treatment plan, treatment alternatives and goals were discussed and mutually agreed upon: by patient and by family  Army Fossa 10/07/2022, 4:19 PM

## 2022-10-07 NOTE — Progress Notes (Signed)
PROGRESS NOTE   Subjective/Complaints:  Pt first tells family that she doesn't want to "do this", but later agrees to "give it a try" Aating pureed breakfast   ROS- no SOB, no N/V Objective:   No results found. Recent Labs    10/06/22 0125 10/06/22 1141 10/07/22 0601  WBC 10.2  --  12.7*  HGB 6.5* 8.3* 8.2*  HCT 20.8* 25.5* 25.4*  PLT 183  --  207   Recent Labs    10/06/22 1722 10/07/22 0601  NA 136 139  K 4.8 4.7  CL 110 113*  CO2 20* 19*  GLUCOSE 177* 177*  BUN 41* 38*  CREATININE 0.93 0.86  CALCIUM 7.8* 7.9*    Intake/Output Summary (Last 24 hours) at 10/07/2022 0729 Last data filed at 10/07/2022 4098 Gross per 24 hour  Intake 60 ml  Output 550 ml  Net -490 ml        Physical Exam: Vital Signs Blood pressure 139/61, pulse 69, temperature 98 F (36.7 C), resp. rate 15, height 5\' 3"  (1.6 m), weight 79.4 kg, SpO2 100 %.  ENT:  + HOH  General: alert No acute distress Mood and affect are appropriate Heart: Regular rate and rhythm no rubs murmurs or extra sounds Lungs: Clear to auscultation, breathing unlabored, no rales or wheezes Abdomen: Positive bowel sounds, soft nontender to palpation, nondistended Extremities: No clubbing, cyanosis, or edema Skin: No evidence of breakdown, no evidence of rash Neurologic: Cranial nerves II through XII intact, motor strength is 5/5 in right 4/5 Left  deltoid, bicep, tricep, grip, 4/5 Right and0/5 left hip flexor, knee extensors, ankle dorsiflexor and plantar flexor Sensory exam reduced  sensation to light touch left han vs right side   Musculoskeletal: pain inhibits movement Left LE, thigh swollen as expected small amt of bruising around incision sites    Assessment/Plan: 1. Functional deficits which require 3+ hours per day of interdisciplinary therapy in a comprehensive inpatient rehab setting. Physiatrist is providing close team supervision and 24 hour  management of active medical problems listed below. Physiatrist and rehab team continue to assess barriers to discharge/monitor patient progress toward functional and medical goals  Care Tool:  Bathing              Bathing assist       Upper Body Dressing/Undressing Upper body dressing        Upper body assist      Lower Body Dressing/Undressing Lower body dressing            Lower body assist       Toileting Toileting    Toileting assist       Transfers Chair/bed transfer  Transfers assist           Locomotion Ambulation   Ambulation assist              Walk 10 feet activity   Assist           Walk 50 feet activity   Assist           Walk 150 feet activity   Assist           Walk 10 feet on  uneven surface  activity   Assist           Wheelchair     Assist               Wheelchair 50 feet with 2 turns activity    Assist            Wheelchair 150 feet activity     Assist          Blood pressure 139/61, pulse 69, temperature 98 F (36.7 C), resp. rate 15, height 5\' 3"  (1.6 m), weight 79.4 kg, SpO2 100 %.  Medical Problem List and Plan: 1. Functional deficits secondary to acute lacunar infarcts of bilateral corona radiata after left intertrochanteric hip fracture.  Status post intramedullary nailing 10/03/2022.  Weightbearing as tolerated             -patient may shower             -ELOS/Goals: 10-14 days, supervision to min assist goals with PT, OT, SLP             -pt will need positive reinforcement/ego support as she moves forward with rehab program. Family appear very supportive. Pt and family will take "one day at a time".   2.  Antithrombotics: -DVT/anticoagulation:  Pharmaceutical: Xarelto             -antiplatelet therapy: N/A 3. Pain Management: Oxycodone/Robaxin as needed Took oxy IR 10mg  ~1 hr ago , no excess sedation  4. Mood/Behavior/Sleep: Team to provide  emotional support             -pt with anxiety and likely reactive depression about being in hospital as well as the stroke and hip fracture themselves.              -antipsychotic agents: N/A 5. Neuropsych/cognition: This patient is capable of making decisions on her own behalf. 6. Skin/Wound Care: Routine skin checks 7. Fluids/Electrolytes/Nutrition: Routine in and outs with follow-up chemistries 8.  Acute blood loss anemia.  Follow-up CBC 9.  Diabetes mellitus.  Latest hemoglobin A1c 5.7.  Currently on SSI.  Patient on Amaryl 1.5 mg daily prior to admission.  Resume as needed             CBG (last 3)  Recent Labs    10/06/22 1733 10/06/22 2057 10/07/22 0630  GLUCAP 142* 155* 168*    10.  Permissive hypertension.  Patient on lisinopril 20 mg daily prior to admission.   Vitals:   10/06/22 2105 10/07/22 0629  BP: 119/84 139/61  Pulse: 90 69  Resp: 18 15  Temp: 98.7 F (37.1 C) 98 F (36.7 C)  SpO2: 100% 100%    11.  Hypothyroidism.  Synthroid 12.  Hyperlipidemia.  Crestor 13.  Atrial fibrillation.  Xarelto resumed.  Cardiac rate controlled, regular rhythm.    LOS: 1 days A FACE TO FACE EVALUATION WAS PERFORMED  Erick Colace 10/07/2022, 7:29 AM

## 2022-10-07 NOTE — Progress Notes (Signed)
Inpatient Rehabilitation  Patient information reviewed and entered into eRehab system by Authur Cubit M. Jancarlo Biermann, M.A., CCC/SLP, PPS Coordinator.  Information including medical coding, functional ability and quality indicators will be reviewed and updated through discharge.    

## 2022-10-07 NOTE — Evaluation (Signed)
Speech Language Pathology Assessment and Plan  Patient Details  Name: Amanda [name] MRN: [id number] Date of Birth: 06/30/1918  SLP Diagnosis: Dysarthria;Dysphagia  Rehab Potential: Fair ELOS: 2 weeks    Today's Date: 10/07/2022 SLP Individual Time: 1100-1200 SLP Individual Time Calculation (min): 60 min   Hospital Problem: Principal Problem:   Embolic stroke [facility]) Active Problems:   Lacunar infarction [facility])  Past Medical History:  Past Medical History:  Diagnosis Date   Carpal tunnel syndrome    Chronic anticoagulation    Chronic atrial fibrillation (HCC)    Diabetes mellitus    NON-INSULIN   Hypothyroidism    Sinus congestion    Past Surgical History:  Past Surgical History:  Procedure Laterality Date   APPENDECTOMY     CARDIOVASCULAR STRESS TEST  07/10/2004   EF 81%   CHOLECYSTECTOMY     INTRAMEDULLARY (IM) NAIL INTERTROCHANTERIC Left 10/01/2022   Procedure: INTRAMEDULLARY (IM) NAIL INTERTROCHANTERIC;  Surgeon: Tarry Kos, MD;  Location: [facility] OR;  Service: Orthopedics;  Laterality: Left;   US ECHOCARDIOGRAPHY  12/06/2007   EF 55-60%    Assessment / Plan / Recommendation Clinical Impression  Amanda [name] with history of atrial fibrillation maintained on Xarelto, diabetes mellitus, hypothyroidism. Per chart review patient lives alone. Split-level home, 3 stairs up/down stairs with 1 side railing. Used a rollator and a cane in the home.. Presented 09/29/2022 after mechanical fall when she missed 1 step landing on her left hip. No loss of consciousness. Cranial CT scan negative for acute changes. X-rays and imaging revealed displaced intertrochanteric fracture of the left hip. Admission chemistries unremarkable except sodium 132, glucose 246, hemoglobin 11.2. Underwent open treatment of intertrochanteric hip fracture with intramedullary implant 10/01/2022 per Dr. Roda Shutters. Weightbearing as tolerated left lower extremity. Postoperative slurred speech and left-sided facial  droop. MRI of the brain showed acute lacunar infarct in the bilateral corona radiata. MRA negative for large vessel occlusion. Carotid Dopplers with no ICA stenosis. Echocardiogram with ejection fraction of 60 to 65% no wall motion abnormalities. Neurology follow-up patient was cleared to resume Xarelto as prior to admission. She is currently maintained dysphagia #1 nectar thick liquid diet. Therapy evaluations completed due to patient decreased functional mobility was admitted for a comprehensive rehab program.   Pt presents with a mild to moderate oropharyngeal dysphagia. Clinical Swallow Evaluation completed with pt at bedside and family present throughout. Pt's dtr and son served as historian and endorsed dysphagia and s/sx pen/asp of thin liquids months leading up until current hospitalization. Pt was easily aroused however endorsed fatigue. She intermittently engaged in conversation when addressed otherwise, observed with her eyes closed. Oral mechanism exam revealed left side facial droop, missing upper dentition and lower bilateral molars. When asked, pt was agreeable for PO trials. Pt was repositioned upright with head of bed support. Oral hygiene completed via suction toothbrush. SLP presented trials of thin liquid, nectar thick liquid, and puree solids. Thin liquids presented via 1/3 tsp bolus on spoon. Pt presented with decreased labial seal, anterior spillage and immediate strong cough. Trials of nectar thick liquid via 10cc Provale cup and spoon sips were unremarkable. She was agreeable for a small bite of puree, ~1/4 tsp with appeared adequate formation, transit, and timely swallow with no s/sx pen/asp. Pt declined additional trials and requested to "rest." Recommendations made to family to continue Dys 1 and nectar thickened liquids with full supervision and oral hygiene completed prior to meals.   Pt also presents with moderate dysarthria with oral motor  deficits as mentioned above. Dysarthria  c/b decreased vocal intensity, breathiness, monotone and monoloudness. Further deficits include imprecise articulation and overall speech intelligibility 50% with words and ~40% with phrases and sentences. Per pt's family she is functioning at her baseline cognitively. Pt presented with difficulty engaging throughout session with impaired awareness and comprehension possibly 2/2 lethargy. She retained ability to follow simple one step directions Formal cognitive assessment may be warranted as pt's alertness improves     Skilled Therapeutic Interventions          CSE and informal assessment measures administered. Please see full report for additional details.     SLP Assessment  Patient will need skilled Speech Lanaguage Pathology Services during CIR admission    Recommendations  SLP Diet Recommendations: Dysphagia 1 (Puree);Nectar Liquid Administration via: Cup;Spoon (Provale cup) Medication Administration: Crushed with puree Supervision: Full supervision/cueing for compensatory strategies;Staff to assist with self feeding;Trained caregiver to feed patient Compensations: Follow solids with liquid;Slow rate;Small sips/bites;Minimize environmental distractions Postural Changes and/or Swallow Maneuvers: Seated upright 90 degrees;Upright 30-60 min after meal Oral Care Recommendations: Oral care BID;Oral care before and after PO Follow up Recommendations: 24 hour supervision/assistance;Home Health SLP Equipment Recommended: To be determined    SLP Frequency 1 to 3 out of 7 days   SLP Duration  SLP Intensity  SLP Treatment/Interventions 2 weeks  Minumum of 1-2 x/day, 30 to 90 minutes  Dysphagia/aspiration precaution training;Internal/external aids;Patient/family education;Multimodal communication approach;Functional tasks;Therapeutic Activities    Pain Pain Assessment Pain Scale: 0-10 Pain Score: 0-No pain Faces Pain Scale: Hurts whole lot Pain Type: Surgical pain Pain Location: Hip Pain  Orientation: Left Pain Descriptors / Indicators: Aching;Discomfort Pain Frequency: Constant Pain Onset: On-going Pain Intervention(s): Medication (See eMAR)  Prior Functioning Cognitive/Linguistic Baseline: Within functional limits Type of Home: House  Lives With: Alone Available Help at Discharge: Family;Available 24 hours/day Vocation: Retired  Architectural technologist Overall Cognitive Status: Difficult to assess Arousal/Alertness: Lethargic Orientation Level: Oriented to person;Oriented to place;Oriented to situation Month: May Attention: Focused Focused Attention: Impaired Memory: Appears intact Awareness: Impaired Awareness Impairment: Intellectual impairment Problem Solving:  (dificult to assess d/t lethargy) Safety/Judgment: Other (comment) (difficult to assess d/t lethargy)  Comprehension Auditory Comprehension Overall Auditory Comprehension: Impaired Yes/No Questions: Within Functional Limits Commands: Within Functional Limits Conversation: Simple Interfering Components: Hearing;Processing speed EffectiveTechniques: Extra processing time Visual Recognition/Discrimination Discrimination: Not tested Reading Comprehension Reading Status: Not tested Expression Verbal Expression Overall Verbal Expression: Impaired Level of Generative/Spontaneous Verbalization: Phrase;Word Naming: Not tested Pragmatics: No impairment Effective Techniques: Open ended questions Written Expression Dominant Hand: Right Written Expression: Not tested Oral Motor Oral Motor/Sensory Function Overall Oral Motor/Sensory Function: Mild impairment Facial Symmetry: Other (Comment);Suspected CN VII (facial) dysfunction;Abnormal symmetry left Facial Strength: Reduced left Facial Sensation: Within Functional Limits Lingual ROM: Within Functional Limits Lingual Symmetry: Within Functional Limits Lingual Strength: Reduced;Suspected CN XII (hypoglossal) dysfunction Motor Speech Overall  Motor Speech: Impaired Respiration: Impaired Level of Impairment: Word Phonation: Breathy Articulation: Impaired Level of Impairment: Word Intelligibility: Intelligibility reduced Word: 50-74% accurate Phrase: 25-49% accurate Sentence: 25-49% accurate Conversation: 25-49% accurate Motor Speech Errors: Not applicable Interfering Components: Inadequate dentition Effective Techniques: Slow rate  Care Tool Care Tool Cognition Ability to hear (with hearing aid or hearing appliances if normally used Ability to hear (with hearing aid or hearing appliances if normally used): 2. Moderate difficulty - speaker has to increase volume and speak distinctly   Expression of Ideas and Wants Expression of Ideas and Wants: 3. Some difficulty - exhibits some difficulty  with expressing needs and ideas (e.g, some words or finishing thoughts) or speech is not clear   Understanding Verbal and Non-Verbal Content Understanding Verbal and Non-Verbal Content: 3. Usually understands - understands most conversations, but misses some part/intent of message. Requires cues at times to understand  Memory/Recall Ability Memory/Recall Ability : That he or she is in a hospital/hospital unit    Bedside Swallowing Assessment General Diet Prior to this Study: Dysphagia 1 (pureed);Mildly thick liquids (Level 2, nectar thick) Respiratory Status: Room air History of Recent Intubation: No Total duration of intubation (days): 1 days Date extubated: 10/01/22 Behavior/Cognition: Lethargic/Drowsy Oral Cavity - Dentition: Missing dentition Self-Feeding Abilities: Needs assist Vision: Functional for self-feeding Volitional Cough: Strong Volitional Swallow: Able to elicit  Oral Care Assessment Oral Assessment  (WDL): Exceptions to WDL Lips: Asymmetrical Teeth: Missing (Comment) Tongue: Pink;Dry Mucous Membrane(s): Pink Saliva: Moist, saliva free flowing Level of Consciousness: Alert Is patient on any of following O2  devices?: None of the above Nutritional status: Dysphagia Oral Assessment Risk : High Risk Ice Chips Ice chips: Not tested Thin Liquid Thin Liquid: Impaired Presentation: Spoon;Self Fed Oral Phase Impairments: Reduced labial seal;Poor awareness of bolus;Reduced lingual movement/coordination Oral Phase Functional Implications: Left anterior spillage Pharyngeal  Phase Impairments: Cough - Immediate Nectar Thick Nectar Thick Liquid: Within functional limits Presentation: Cup;Straw;Self Fed Honey Thick Honey Thick Liquid: Not tested Puree Puree: Within functional limits Presentation: Spoon Solid Solid: Not tested BSE Assessment Suspected Esophageal Findings Suspected Esophageal Findings: Belching Risk for Aspiration Impact on safety and function: Moderate aspiration risk Other Related Risk Factors: History of dysphagia  Short Term Goals: Week 1: SLP Short Term Goal 1 (Week 1): Patient will consume trials of thin liquid via Provale cup with minimal overt s/s of aspiration and moderate level verbal cues for use of swallowing compensatory strategies. SLP Short Term Goal 2 (Week 1): Patient will consume trials of Dys 2 with adequate oral clearance and min level verbal cues for use of swallowing compensatory strategies. SLP Short Term Goal 3 (Week 1): Patient will demonstrate 50% intelligibilty at the phrase level with min to mod level verbal cues for use of speech intelligibility strategies.  Refer to Care Plan for Long Term Goals  Recommendations for other services: None   Discharge Criteria: Patient will be discharged from SLP if patient refuses treatment 3 consecutive times without medical reason, if treatment goals not met, if there is a change in medical status, if patient makes no progress towards goals or if patient is discharged from hospital.  The above assessment, treatment plan, treatment alternatives and goals were discussed and mutually agreed upon: by family  Renaee Munda 10/07/2022, 4:23 PM

## 2022-10-07 NOTE — Plan of Care (Signed)
  Problem: RH Balance Goal: LTG Patient will maintain dynamic sitting balance (PT) Description: LTG:  Patient will maintain dynamic sitting balance with assistance during mobility activities (PT) Flowsheets (Taken 10/07/2022 1858) LTG: Pt will maintain dynamic sitting balance during mobility activities with:: Contact Guard/Touching assist Goal: LTG Patient will maintain dynamic standing balance (PT) Description: LTG:  Patient will maintain dynamic standing balance with assistance during mobility activities (PT) Flowsheets (Taken 10/07/2022 1858) LTG: Pt will maintain dynamic standing balance during mobility activities with:: Moderate Assistance - Patient 50 - 74%   Problem: Sit to Stand Goal: LTG:  Patient will perform sit to stand with assistance level (PT) Description: LTG:  Patient will perform sit to stand with assistance level (PT) Flowsheets (Taken 10/07/2022 1858) LTG: PT will perform sit to stand in preparation for functional mobility with assistance level: Minimal Assistance - Patient > 75%   Problem: RH Bed Mobility Goal: LTG Patient will perform bed mobility with assist (PT) Description: LTG: Patient will perform bed mobility with assistance, with/without cues (PT). Flowsheets (Taken 10/07/2022 1858) LTG: Pt will perform bed mobility with assistance level of: Minimal Assistance - Patient > 75%   Problem: RH Bed to Chair Transfers Goal: LTG Patient will perform bed/chair transfers w/assist (PT) Description: LTG: Patient will perform bed to chair transfers with assistance (PT). Flowsheets (Taken 10/07/2022 1858) LTG: Pt will perform Bed to Chair Transfers with assistance level: Minimal Assistance - Patient > 75%   Problem: RH Car Transfers Goal: LTG Patient will perform car transfers with assist (PT) Description: LTG: Patient will perform car transfers with assistance (PT). Flowsheets (Taken 10/07/2022 1858) LTG: Pt will perform car transfers with assist:: Moderate Assistance -  Patient 50 - 74%   Problem: RH Ambulation Goal: LTG Patient will ambulate in controlled environment (PT) Description: LTG: Patient will ambulate in a controlled environment, # of feet with assistance (PT). Flowsheets (Taken 10/07/2022 1858) LTG: Pt will ambulate in controlled environ  assist needed:: Moderate Assistance - Patient 50 - 74% LTG: Ambulation distance in controlled environment: 14ft using LRAD Goal: LTG Patient will ambulate in home environment (PT) Description: LTG: Patient will ambulate in home environment, # of feet with assistance (PT). Flowsheets (Taken 10/07/2022 1858) LTG: Pt will ambulate in home environ  assist needed:: Moderate Assistance - Patient 50 - 74% LTG: Ambulation distance in home environment: 18ft using LRAD

## 2022-10-08 DIAGNOSIS — I639 Cerebral infarction, unspecified: Secondary | ICD-10-CM | POA: Diagnosis not present

## 2022-10-08 DIAGNOSIS — Z515 Encounter for palliative care: Secondary | ICD-10-CM

## 2022-10-08 LAB — GLUCOSE, CAPILLARY
Glucose-Capillary: 152 mg/dL — ABNORMAL HIGH (ref 70–99)
Glucose-Capillary: 134 mg/dL — ABNORMAL HIGH (ref 70–99)
Glucose-Capillary: 126 mg/dL — ABNORMAL HIGH (ref 70–99)

## 2022-10-08 MED ORDER — OXYCODONE HCL 5 MG PO TABS
10.0000 mg | ORAL_TABLET | Freq: Four times a day (QID) | ORAL | Status: DC
  Administered 2022-10-08: 10 mg via ORAL
  Filled 2022-10-08: qty 2

## 2022-10-08 NOTE — Progress Notes (Addendum)
Patient ID: Tally Due, female   DOB: Jan 12, 1919, 89 y.o.   MRN: [id number]   9:00 AM: Sw met with family in room to discuss transition to hospice/[facility]. Family aware palliative/hospice team will visit with them to determine appropriateness and discuss transition. Dtr and family would prefer transition to happen ASAP and have requested to d/c all therapies.  Intake initiated with intake with [facility]. Information will be provided to hospital team to meet with family to determine if pt appropriate for [facility]. SW will wait for FU from team. No additional questions or concerns.   Family will wait in room today with pt.    9:24 AM: Pt chart being reviewed and [facility] plans to visit with family today.

## 2022-10-08 NOTE — Progress Notes (Signed)
Inpatient Rehabilitation Care Coordinator Discharge Note   Patient Details  Name: Amanda [name] MRN: [id number] Date of Birth: Dec 05, 1918   Discharge location: [facility]  Length of Stay: 2 Days  Discharge activity level: Max/total  Home/community participation: children  Patient response WU:[id number] Literacy - How often do you need to have someone help you when you read instructions, pamphlets, or other written material from your doctor or pharmacy?: Always  Patient response YN:WGNFAO Isolation - How often do you feel lonely or isolated from those around you?: Never  Services provided included: MD, RD, PT, OT, SLP, CM, RN, TR, Pharmacy, Neuropsych, SW  Financial Services:  Field seismologist Utilized: Medicare    Choices offered to/list presented to: Daughter and son  Follow-up services arranged:              Patient response to transportation need: Is the patient able to respond to transportation needs?: Yes In the past 12 months, has lack of transportation kept you from medical appointments or from getting medications?: No In the past 12 months, has lack of transportation kept you from meetings, work, or from getting things needed for daily living?: No   Patient/Family verbalized understanding of follow-up arrangements:  Yes  Individual responsible for coordination of the follow-up plan: Diane  Confirmed correct DME delivered: Andria Rhein 10/08/2022    Comments (or additional information):  Summary of Stay    Date/Time Discharge Planning CSW  10/07/22 1338 Pt will discharge home with [facility] services and family to provide 24/7 supervision. Daughter able to provide supervision only and son able to provide PRN supervision. CJB       Andria Rhein

## 2022-10-08 NOTE — Plan of Care (Signed)
Changed to comfort care; discharged from the rehab program to [facility]

## 2022-10-08 NOTE — Progress Notes (Signed)
Occupational Therapy Discharge Summary  Patient Details  Name: Amanda [name] MRN: [id number] Date of Birth: 1919/01/17  Date of Discharge from OT service:Oct 08, 2022  Patient has met 0 of 0 long term goals due to  lethargy and Pt/family request to d/c therapy services .  Patient to discharge at overall  Max-total assist  level.  Patient's care partner is independent to provide the necessary physical and cognitive assistance at discharge. Family declined this level of therapy and would like to pursue hospice care vs continuing in-patient services. No new changes in Pt's functional status. Information below pulled through from initial evaluation from previous day.  Reasons goals not met: Pt and Pt's family refused IPR program.  Recommendation:  Pt will receive hospice care at d/c.   Equipment: Hospice will be providing the necessary equipment to the family.    Reasons for discharge:  Pt and family refusal of IPR program.   Patient/family agrees with progress made and goals achieved: Yes  OT Discharge Precautions/Restrictions  Precautions Precautions: Fall Restrictions Weight Bearing Restrictions: Yes RLE Weight Bearing: Weight bearing as tolerated LLE Weight Bearing: Weight bearing as tolerated General Chart Reviewed: Yes Additional Pertinent History: >2 falls past two months, A-fib, DM, hypothyroidism Family/Caregiver Present: Yes (DTR- Nancy and Son-Mike) Pain Pain Assessment Pain Scale: 0-10 Pain Score: 0-No pain Faces Pain Scale: Hurts whole lot Pain Type: Surgical pain Pain Location: Hip Pain Orientation: Left Pain Descriptors / Indicators: Aching;Discomfort Pain Frequency: Constant Pain Onset: On-going Pain Intervention(s): Medication (See eMAR) Home Living/Prior Functioning Home Living Living Arrangements: Alone Available Help at Discharge: Family, Available 24 hours/day Type of Home: House Home Access: Stairs to enter [employer] of Steps: 1  (3") + 4 Entrance Stairs-Rails: Left Home Layout: Other (Comment) (split level, no bathroom on main level) Alternate Level Stairs-Rails: Right Bathroom Shower/Tub: Tub/shower unit, Health visitor: Standard Bathroom Accessibility: Yes Additional Comments: Pt lives alone in split level house, 3 stairs up/down stairs with one side railing. Pt uses rollator and cane in home.  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 [place]: Independent with homemaking with ambulation, Requires assistive device for independence, Needs assistance with homemaking, Needs assistance with ADLs Bath: Moderate Laundry: Moderate  Able to Take Stairs?: Yes Driving: No Vocation: Retired Administrator, sports Baseline Vision/History: 1 Wears glasses Ability to See in Adequate Light: 0 Adequate Patient Visual Report: No change from baseline Vision Assessment?: Yes Eye Alignment: Within Functional Limits Ocular Range of Motion: Within Functional Limits Alignment/Gaze Preference: Within Defined Limits Tracking/Visual Pursuits: Able to track stimulus in all quads without difficulty Saccades: Decreased speed of saccadic movement Convergence: Within functional limits (no double vision reported) Visual Fields: No apparent deficits Perception  Perception: Within Functional Limits Praxis Praxis: Intact Cognition Cognition Overall Cognitive Status: Difficult to assess Arousal/Alertness: Lethargic Memory: Appears intact Attention: Focused Focused Attention: Impaired Awareness: Impaired Awareness Impairment: Intellectual impairment Problem  Solving:  (dificult to assess d/t lethargy) Safety/Judgment: Other (comment) (difficult to assess d/t lethargy) Brief Interview for Mental Status (BIMS) Repetition of Three Words (First Attempt): 3 Temporal Orientation: Year: Correct Temporal Orientation: Month: Accurate within 5 days Temporal Orientation: Day: Correct Recall: "Sock": No, could not recall Recall: "Blue": No, could not recall Recall: "Bed": Yes, no cue required BIMS  Summary Score: 11 Sensation Sensation Light Touch: Impaired by gross assessment (carpal tunnel and decreased sensation in BUEs present at baseline) Hot/Cold: Not tested Proprioception: Appears Intact Stereognosis: Not tested Coordination Gross Motor Movements are Fluid and Coordinated: No Fine Motor Movements are Fluid and Coordinated: No Coordination and Movement Description: limited by pain and strength deficits Finger Nose Finger Test: Pt presenting with slowed motor movements Motor  Motor Motor - Skilled Clinical Observations: Limited by pain and decreased strength in BLEs  Trunk/Postural Assessment  Cervical Assessment Cervical Assessment: Exceptions to [facility] (mild forward head) Thoracic Assessment Thoracic Assessment: Exceptions to [facility] (midl rounded shoudlers) Lumbar Assessment Lumbar Assessment: Exceptions to [facility] (posterior pelvic tilt) Postural Control Postural Control: Deficits on evaluation Trunk Control: deficit- assistance required during static sitting EOB mod A Protective Responses: delayed  Balance Balance Balance Assessed: Yes Static Sitting Balance Static Sitting - Balance Support: Feet supported Static Sitting - Level of Assistance: 3: Mod assist;4: Min assist Dynamic Sitting Balance Dynamic Sitting - Balance Support: Feet supported Dynamic Sitting - Level of Assistance: 3: Mod assist Static Standing Balance Static Standing - Balance Support: Bilateral upper extremity supported Static Standing - Level of Assistance: 2: Max  assist;3: Mod assist Extremity/Trunk Assessment RUE Assessment RUE Assessment: Exceptions to [facility] General Strength Comments: 3+/5 LUE Assessment LUE Assessment: Exceptions to [facility] General Strength Comments: 3+/5      Army Fossa 10/08/2022, 11:01 AM

## 2022-10-08 NOTE — Progress Notes (Signed)
Occupational Therapy Session Note  Patient Details  Name: VALJEAN GOLIN MRN: [id number] Date of Birth: Jul 24, 1918  Today's Date: 10/08/2022 OT Individual Time: 4098-1191 OT Individual Time Calculation (min): 15 min    Short Term Goals: Week 1:  OT Short Term Goal 1 (Week 1): Pt will self feed 50% of meal wih supervision OT Short Term Goal 2 (Week 1): Pt will maintain sitting balance during BADLs min A OT Short Term Goal 3 (Week 1): Pt will complete sit>stands with mod A x1 using LRAD  Skilled Therapeutic Interventions/Progress Updates:     AM Session: Pt received deeply sleeping in bed presenting to be lethargic with family present in room. Pt's family reporting they are considering d/c'ing therapy services d/t the Pt's decreased motivation to live with Pt continuously stating "89 years is enough, I am ready to die" to the family the previous day. OT providing therapeutic support and listening to family members present. Provided in depth education on purpose of IPR services for rehabilitation, family education, decreased caregiver bourdon, and improved overall quality of life with all family members verbalizing understanding. Despite maximal education and therapeutic support provided, pt's family continuing to request that Pt d/c from therapy services and seek hospice care stating Pt is in agreement with plan. Notified medical team of Pt and family's request. Pt was left resting in bed with call bell in reach, bed alarm on, and all needs met.    Therapy Documentation Precautions:  Precautions Precautions: Fall Restrictions Weight Bearing Restrictions: Yes RLE Weight Bearing: Weight bearing as tolerated LLE Weight Bearing: Weight bearing as tolerated General:   Vital Signs: Therapy Vitals Temp: 98.1 F (36.7 C) Temp Source: Axillary Pulse Rate: 76 Resp: 18 BP: (Abnormal) 166/55 Patient Position (if appropriate): Lying Oxygen Therapy SpO2: 98 % O2 Device: Nasal  Cannula  Therapy/Group: Individual Therapy  Army Fossa 10/08/2022, 7:45 AM

## 2022-10-08 NOTE — Discharge Summary (Signed)
Physician Discharge Summary  Patient ID: Amanda [name] MRN: [id number] DOB/AGE: 1918/09/19 89 y.o.  Admit date: 10/06/2022 Discharge date: 10/08/2022  Discharge Diagnoses:  Principal Problem:   Embolic stroke [facility] [place]) Active Problems:   Lacunar infarction [facility]) Atrial fibrillation Diabetes mellitus Acute blood loss anemia Permissive hypertension Hypothyroidism Hyperlipidemia Left intertrochanteric hip fracture Dysphagia Failure to thrive  Discharged Condition: Guarded  Significant Diagnostic Studies: VAS US CAROTID  Result Date: 10/04/2022 Carotid Arterial Duplex Study Patient Name:  Amanda [name]  Date of Exam:   10/03/2022 Medical Rec #: [id number]           Accession #:    [id number] Date of Birth: 1918-09-17           Patient Gender: F Patient Age:   89 years Exam Location:  [facility] Procedure:      VAS US CAROTID Referring Phys: Gevena Mart --------------------------------------------------------------------------------  Indications:       CVA. Risk Factors:      Hypertension, Diabetes. Comparison Study:  01/01/2019 - Right Carotid: The extracranial vessels were                    near-normal with only minimal                    wall                    thickening or plaque.                     Left Carotid: The extracranial vessels were near-normal with                    only minimal                    wall                    thickening or plaque.                     Vertebrals: Bilateral vertebral arteries demonstrate                    antegrade flow.                    Subclavians: Normal flow hemodynamics were seen in bilateral                    subclavian                    arteries. Performing Technologist: Chanda Busing RVT  Examination Guidelines: A complete evaluation includes B-mode imaging, spectral Doppler, color Doppler, and power Doppler as needed of all accessible portions of each vessel. Bilateral testing is considered an integral part of a complete  examination. Limited examinations for reoccurring indications may be performed as noted.  Right Carotid Findings: +----------+--------+--------+--------+-----------------------+--------+           PSV cm/sEDV cm/sStenosisPlaque Description     Comments +----------+--------+--------+--------+-----------------------+--------+ CCA Prox  96      10              smooth and heterogenous         +----------+--------+--------+--------+-----------------------+--------+ CCA Distal96      13              smooth and heterogenous         +----------+--------+--------+--------+-----------------------+--------+ ICA Prox  69      17              smooth and heterogenous         +----------+--------+--------+--------+-----------------------+--------+ ICA Mid   103     27              smooth and heterogenous         +----------+--------+--------+--------+-----------------------+--------+ ICA Distal104     21                                     tortuous +----------+--------+--------+--------+-----------------------+--------+ ECA       104     0                                               +----------+--------+--------+--------+-----------------------+--------+ +----------+--------+-------+--------+-------------------+           PSV cm/sEDV cmsDescribeArm Pressure (mmHG) +----------+--------+-------+--------+-------------------+ [id number]                                        +----------+--------+-------+--------+-------------------+ +---------+--------+--+--------+--+---------+ VertebralPSV cm/s57EDV cm/s12Antegrade +---------+--------+--+--------+--+---------+  Left Carotid Findings: +----------+--------+--------+--------+-----------------------+--------+           PSV cm/sEDV cm/sStenosisPlaque Description     Comments +----------+--------+--------+--------+-----------------------+--------+ CCA Prox  140     13              smooth and heterogenous          +----------+--------+--------+--------+-----------------------+--------+ CCA Distal100     13              smooth and heterogenous         +----------+--------+--------+--------+-----------------------+--------+ ICA Prox  93      20                                              +----------+--------+--------+--------+-----------------------+--------+ ICA Mid   91      23              smooth and heterogenous         +----------+--------+--------+--------+-----------------------+--------+ ICA Distal125     29                                     tortuous +----------+--------+--------+--------+-----------------------+--------+ ECA       101     0                                               +----------+--------+--------+--------+-----------------------+--------+ +----------+--------+--------+--------+-------------------+           PSV cm/sEDV cm/sDescribeArm Pressure (mmHG) +----------+--------+--------+--------+-------------------+ [id number]                                         +----------+--------+--------+--------+-------------------+ +---------+--------+--+--------+--+---------+ VertebralPSV cm/s56EDV cm/s13Antegrade +---------+--------+--+--------+--+---------+   Summary: Right Carotid: Velocities in the right ICA are consistent  with a 1-39% stenosis. Left Carotid: Velocities in the left ICA are consistent with a 1-39% stenosis. Vertebrals: Bilateral vertebral arteries demonstrate antegrade flow. *See table(s) above for measurements and observations.  Electronically signed by Gerarda Fraction on 10/04/2022 at 8:51:06 AM.    Final    MR ANGIO HEAD WO CONTRAST  Result Date: 10/04/2022 CLINICAL DATA:  Follow-up examination for stroke. EXAM: MRA HEAD WITHOUT CONTRAST TECHNIQUE: Angiographic images of the Circle of Willis were acquired using MRA technique without intravenous contrast. COMPARISON:  Prior MRI from 10/02/2022. FINDINGS: Anterior  circulation: Examination moderately to severely degraded by motion artifact. Both internal carotid arteries are patent through the siphons without visible stenosis or other abnormality. A1 segments patent. Grossly normal anterior communicating complex. Anterior cerebral arteries patent without visible stenosis. No M1 stenosis or occlusion. No visible proximal MCA branch occlusion or high-grade stenosis. Distal MCA branches grossly perfused and symmetric. Posterior circulation: Visualized V4 segments patent without stenosis. Left PICA grossly patent at its origin. Right PICA origin not well seen on this motion degraded exam. Basilar patent without stenosis. Superior cerebral arteries patent bilaterally. Both PCAs primarily supplied via the basilar. Suspected atheromatous irregularity within both PCAs which remain patent to their distal aspects. Evaluation for potential stenosis fairly limited by motion. Anatomic variants: None significant. Other: No visible aneurysm. IMPRESSION: 1. Technically limited exam due to extensive motion artifact. 2. Negative intracranial MRA for large vessel occlusion. Please note that evaluation for potential stenoses is fairly limited given motion artifact. Electronically Signed   By: Rise Mu M.D.   On: 10/04/2022 04:10   ECHOCARDIOGRAM COMPLETE  Result Date: 10/03/2022    ECHOCARDIOGRAM REPORT   Patient Name:   Amanda [name] Date of Exam: 10/03/2022 Medical Rec #:  [id number]          Height:       63.0 in Accession #:    [id number]         Weight:       151.9 lb Date of Birth:  05-18-1919          BSA:          1.720 m Patient Age:    89 years          BP:           120/37 mmHg Patient Gender: F                  HR:           91 bpm. Exam Location:  Inpatient Procedure: 2D Echo, Cardiac Doppler and Color Doppler Indications:    stroke  History:        Patient has prior history of Echocardiogram examinations, most                 recent 03/18/2022. Arrythmias:Atrial  Fibrillation; Risk                 Factors:Diabetes and Hypertension.  Sonographer:    Delcie Roch RDCS Referring Phys: [id number] JENNIFER CHOI IMPRESSIONS  1. Left ventricular ejection fraction, by estimation, is 60 to 65%. The left ventricle has normal function. The left ventricle has no regional wall motion abnormalities. There is mild concentric left ventricular hypertrophy. Left ventricular diastolic function could not be evaluated.  2. Right ventricular systolic function is mildly reduced. The right ventricular size is normal. There is severely elevated pulmonary artery systolic pressure.  3. Left atrial size was moderately dilated.  4. Right atrial size was severely  dilated.  5. The mitral valve is degenerative. Trivial mitral valve regurgitation. No evidence of mitral stenosis.  6. Tricuspid valve regurgitation is severe.  7. The aortic valve is tricuspid. Aortic valve regurgitation is not visualized. No aortic stenosis is present.  8. The inferior vena cava is normal in size with greater than 50% respiratory variability, suggesting right atrial pressure of 3 mmHg. FINDINGS  Left Ventricle: Left ventricular ejection fraction, by estimation, is 60 to 65%. The left ventricle has normal function. The left ventricle has no regional wall motion abnormalities. The left ventricular internal cavity size was normal in size. There is  mild concentric left ventricular hypertrophy. Left ventricular diastolic function could not be evaluated due to atrial fibrillation. Left ventricular diastolic function could not be evaluated. Right Ventricle: The right ventricular size is normal. No increase in right ventricular wall thickness. Right ventricular systolic function is mildly reduced. There is severely elevated pulmonary artery systolic pressure. The tricuspid regurgitant velocity is 3.98 m/s, and with an assumed right atrial pressure of 5 mmHg, the estimated right ventricular systolic pressure is 68.4 mmHg. Left  Atrium: Left atrial size was moderately dilated. Right Atrium: Right atrial size was severely dilated. Pericardium: Trivial pericardial effusion is present. The pericardial effusion is posterior to the left ventricle. Mitral Valve: The mitral valve is degenerative in appearance. Trivial mitral valve regurgitation. No evidence of mitral valve stenosis. Tricuspid Valve: The tricuspid valve is normal in structure. Tricuspid valve regurgitation is severe. No evidence of tricuspid stenosis. Aortic Valve: The aortic valve is tricuspid. Aortic valve regurgitation is not visualized. No aortic stenosis is present. Pulmonic Valve: The pulmonic valve was normal in structure. Pulmonic valve regurgitation is trivial. No evidence of pulmonic stenosis. Aorta: The aortic root is normal in size and structure. Venous: The inferior vena cava is normal in size with greater than 50% respiratory variability, suggesting right atrial pressure of 3 mmHg. IAS/Shunts: No atrial level shunt detected by color flow Doppler.  LEFT VENTRICLE PLAX 2D LVIDd:         3.80 cm LVIDs:         2.70 cm LV PW:         1.30 cm LV IVS:        1.00 cm LVOT diam:     1.80 cm LV SV:         40 LV SV Index:   23 LVOT Area:     2.54 cm  RIGHT VENTRICLE          IVC RV Basal diam:  2.90 cm  IVC diam: 1.70 cm TAPSE (M-mode): 0.9 cm LEFT ATRIUM             Index        RIGHT ATRIUM           Index LA diam:        3.90 cm 2.27 cm/m   RA Area:     23.90 cm LA Vol (A2C):   51.8 ml 30.11 ml/m  RA Volume:   80.20 ml  46.62 ml/m LA Vol (A4C):   60.7 ml 35.29 ml/m LA Biplane Vol: 59.2 ml 34.41 ml/m  AORTIC VALVE LVOT Vmax:   109.00 cm/s LVOT Vmean:  67.700 cm/s LVOT VTI:    0.158 m  AORTA Ao Root diam: 2.90 cm Ao Asc diam:  2.90 cm TRICUSPID VALVE TR Peak grad:   63.4 mmHg TR Vmax:        398.00 cm/s  SHUNTS Systemic VTI:  0.16 m Systemic Diam: 1.80 cm Arvilla Meres MD Electronically signed by Arvilla Meres MD Signature Date/Time: 10/03/2022/6:07:58 PM    Final     US RENAL  Result Date: 10/03/2022 CLINICAL DATA:  [id number] AKI (acute kidney injury) (HCC) [id number] EXAM: RENAL / URINARY TRACT ULTRASOUND COMPLETE COMPARISON:  06/13/2009 FINDINGS: Right Kidney: Renal measurements: 8.3 x 3.8 x 4.6 cm = volume: 76.5 mL. No hydronephrosis. Increased renal cortical echogenicity. Left Kidney: Renal measurements: 9.7 x 3.9 x 4.5 cm = volume: 89.0 mL. No hydronephrosis. Increased renal cortical echogenicity. Bladder: Not visualized, presumably decompressed Other: None. IMPRESSION: No hydronephrosis. Increased renal cortical echogenicity bilaterally, as can be seen in medical renal disease. Electronically Signed   By: Caprice Renshaw M.D.   On: 10/03/2022 12:57   DG Swallowing Func-Speech Pathology  Result Date: 10/03/2022 Table formatting from the original result was not included. Modified Barium Swallow Study Patient Details Name: Amanda [name] MRN: [id number] Date of Birth: [birth year] Today's Date: 10/03/2022 HPI/PMH: HPI: Pt is a 89 year old female admitted after a fall and subsequent pain preventing ambulation. Patient was found to have a left hip fracture. Underwnt surgery on 5/15. Reported feeling of food and liquid in throat. Family reports this has been ongoing for a while. She is afraid of choking. Found to ahve had acute CVA perioperatively. MRI shows Acute lacunar infarct in the bilateral corona radiata. Clinical Impression: Pt participatory, but very hard of hearing and a little lethargic this am. Pt able to follow simple commands, but had difficulty staying attentive to follow swallow strategies or comprehend fully though cognition typically WNL. Pt demonstrates a mild oropharyngeal dysphagia with mild anterior spillage due to left facial nerve weakness and mild oral residue likely due to decreased lingual tension.  There were instance of dleayed swallow initaition and penetration before the swallow that was ejected with good vestbublar closure. Trace aspiration after  the swallow did occur due to moderate resiude in the pyriform sinuses. Pt was able to sense and clear with a cough and cleared oral and pharyngeal residue with a second swallow. Pts severe and prolonged coughing however was mostly due to stasis of puree in the proximal esophagus. Pt repeatedly coughing and hacking well after airway clear due to primary esophageal dysphagia. Liquid wash very helpful.  Did not attempt mastication of solids due to xerostomia and no dentition at the time of exam as well as severe esophageal retention and prolonged coughing and globus as a result. Typically pt attempts a mechanical soft texture at home and goes out to eat often with family. Given increased risk of aspiration due to mobility issues after hip fx and pain medication in setting of chronic esophageal dysphagia and mild oropharyngeal dysphagia, will maintain a restricitve diet of puree and nectar thick liquids with aspiration precautions and reflux precautions. Hopeful for upgrade during rehabilitation process. Factors that may increase risk of adverse event in presence of aspiration Rubye Oaks [employer] Clearance Coots 2021): Factors that may increase risk of adverse event in presence of aspiration Rubye Oaks & Clearance Coots 2021): Limited mobility; Aspiration of thick, dense, and/or acidic materials Recommendations/Plan: Swallowing Evaluation Recommendations Swallowing Evaluation Recommendations Recommendations: PO diet PO Diet Recommendation: Dysphagia 1 (Pureed); Mildly thick liquids (Level 2, nectar thick) Liquid Administration via: Cup; Straw Medication Administration: Crushed with puree Supervision: Full supervision/cueing for swallowing strategies Swallowing strategies  : Slow rate; Small bites/sips; Follow solids with liquids; Minimize environmental distractions Postural changes: Position pt fully upright for meals; Stay upright 30-60 min after meals Oral  care recommendations: Oral care BID (2x/day) Treatment Plan Treatment Plan Treatment  recommendations: Therapy as outlined in treatment plan below Follow-up recommendations: Acute inpatient rehab (3 hours/day) Functional status assessment: Patient has had a recent decline in their functional status and demonstrates the ability to make significant improvements in function in a reasonable and predictable amount of time. Treatment frequency: Min 2x/week Treatment duration: 2 weeks Interventions: Aspiration precaution training; Compensatory techniques; Patient/family education; Trials of upgraded texture/liquids; Diet toleration management by SLP Recommendations Recommendations for follow up therapy are one component of a multi-disciplinary discharge planning process, led by the attending physician.  Recommendations may be updated based on patient status, additional functional criteria and insurance authorization. Assessment: Orofacial Exam: Orofacial Exam Oral Cavity: Oral Hygiene: Xerostomia Oral Cavity - Dentition: Edentulous Anatomy: No data recorded Boluses Administered: Boluses Administered Boluses Administered: Mildly thick liquids (Level 2, nectar thick); Thin liquids (Level 0); Puree; Solid  Oral Impairment Domain: Oral Impairment Domain Lip Closure: Escape beyond mid-chin Tongue control during bolus hold: Escape to lateral buccal cavity/floor of mouth Bolus preparation/mastication: -- (solids NT) Oral residue: Residue collection on oral structures Location of oral residue : Tongue; Palate Initiation of pharyngeal swallow : Pyriform sinuses  Pharyngeal Impairment Domain: Pharyngeal Impairment Domain Soft palate elevation: No bolus between soft palate (SP)/pharyngeal wall (PW) Laryngeal elevation: Complete superior movement of thyroid cartilage with complete approximation of arytenoids to epiglottic petiole Anterior hyoid excursion: Complete anterior movement Epiglottic movement: Complete inversion Laryngeal vestibule closure: Complete, no air/contrast in laryngeal vestibule Pharyngeal stripping  wave : Present - complete Pharyngoesophageal segment opening: Complete distension and complete duration, no obstruction of flow Tongue base retraction: Trace column of contrast or air between tongue base and PPW Pharyngeal residue: Collection of residue within or on pharyngeal structures Location of pharyngeal residue: Valleculae; Pyriform sinuses  Esophageal Impairment Domain: Esophageal Impairment Domain Esophageal clearance upright position: Esophageal retention with retrograde flow below pharyngoesophageal segment (PES); Esophageal retention Pill: Esophageal Impairment Domain Esophageal clearance upright position: Esophageal retention with retrograde flow below pharyngoesophageal segment (PES); Esophageal retention Penetration/Aspiration Scale Score: No data recorded Compensatory Strategies: Compensatory Strategies Compensatory strategies: No   General Information: Caregiver present: No  Diet Prior to this Study: Dysphagia 1 (pureed); Mildly thick liquids (Level 2, nectar thick)   Temperature : Normal   No data recorded  Supplemental O2: None (Room air)   History of Recent Intubation: Yes  Behavior/Cognition: Alert; Cooperative; Pleasant mood (hard of hearing) Self-Feeding Abilities: Able to self-feed Baseline vocal quality/speech: Normal Volitional Cough: Able to elicit Volitional Swallow: Able to elicit No data recorded Goal Planning: Prognosis for improved oropharyngeal function: Good No data recorded No data recorded Patient/Family Stated Goal: rehab No data recorded Pain: Pain Assessment Pain Assessment: 0-10 Pain Score: 10 Pain Location: L hip Pain Descriptors / Indicators: Discomfort; Grimacing; Guarding Pain Intervention(s): Monitored during session; Limited activity within patient's tolerance; Repositioned End of Session: Start Time:SLP Start Time (ACUTE ONLY): 1610 Stop Time: SLP Stop Time (ACUTE ONLY): 0940 Time Calculation:SLP Time Calculation (min) (ACUTE ONLY): 15 min Charges: SLP Evaluations $ SLP  Speech Visit: 1 Visit SLP Evaluations $BSS Swallow: 1 Procedure $MBS Swallow: 1 Procedure $Swallowing Treatment: 1 Procedure SLP visit diagnosis: SLP Visit Diagnosis: Dysphagia, oropharyngeal phase (R13.12) Past Medical History: Past Medical History: Diagnosis Date  Carpal tunnel syndrome   Chronic anticoagulation   Chronic atrial fibrillation (HCC)   Diabetes mellitus   NON-INSULIN  Hypothyroidism   Sinus congestion  Past Surgical History: Past Surgical History: Procedure Laterality Date  APPENDECTOMY    CARDIOVASCULAR STRESS TEST  07/10/2004  EF 81%  CHOLECYSTECTOMY    INTRAMEDULLARY (IM) NAIL INTERTROCHANTERIC Left 10/01/2022  Procedure: INTRAMEDULLARY (IM) NAIL INTERTROCHANTERIC;  Surgeon: Tarry Kos, MD;  Location: [facility] OR;  Service: Orthopedics;  Laterality: Left;  US ECHOCARDIOGRAPHY  12/06/2007  EF 55-60% DeBlois, Riley Nearing 10/03/2022, 12:26 PM  MR BRAIN WO CONTRAST  Result Date: 10/02/2022 CLINICAL DATA:  Neuro deficit with acute stroke suspected EXAM: MRI HEAD WITHOUT CONTRAST TECHNIQUE: Multiplanar, multiecho pulse sequences of the brain and surrounding structures were obtained without intravenous contrast. COMPARISON:  Head CT from 3 days ago FINDINGS: Brain: Small acute infarcts in the bilateral corona radiata. Although that bilateral and somewhat symmetric in size and location these are not at the basal ganglia to imply a global/toxic cause. There is chronic small vessel ischemia in the cerebral white matter to a mild degree for age. No acute hemorrhage, hydrocephalus, or collection. Cerebral volume loss that is mild for age. Few chronic microhemorrhages mainly in the deep brain , likely microvascular ischemic. Vascular: Normal flow voids. Skull and upper cervical spine: Normal marrow signal. Sinuses/Orbits: Negative. IMPRESSION: 1. Acute lacunar infarct in the bilateral corona radiata. 2. Generalized atrophy and chronic small vessel ischemia. Electronically Signed   By: Tiburcio Pea M.D.    On: 10/02/2022 19:27   DG FEMUR MIN 2 VIEWS LEFT  Result Date: 10/01/2022 CLINICAL DATA:  [id number] Surgery, elective [id number] EXAM: LEFT FEMUR 2 VIEWS COMPARISON:  09/29/2022 FINDINGS: Intraoperative images during left femur intramedullary nailing for an intertrochanteric fracture. Improved fracture alignment. Intact hardware without evidence of loosening. IMPRESSION: Intraoperative images during left femur ORIF. No evidence of immediate hardware complication. Improved intertrochanteric fracture alignment. Electronically Signed   By: Caprice Renshaw M.D.   On: 10/01/2022 16:14   DG C-Arm 1-60 Min-No Report  Result Date: 10/01/2022 Fluoroscopy was utilized by the requesting physician.  No radiographic interpretation.   DG Ankle Complete Right  Result Date: 09/29/2022 CLINICAL DATA:  Tripped and fell going down 1 step landing on right side. EXAM: RIGHT ANKLE - COMPLETE 3+ VIEW COMPARISON:  None Available. FINDINGS: Ankle mortise is normal. Findings suggested a possible fracture of the medial malleolus seen only on the frontal film. Small inferior calcaneal spur. Minimal degenerative change of the midfoot. IMPRESSION: Possible fracture of the medial malleolus seen only on the frontal film. Electronically Signed   By: Elberta Fortis M.D.   On: 09/29/2022 13:15   DG Hip Unilat With Pelvis 2-3 Views Left  Result Date: 09/29/2022 CLINICAL DATA:  Tripped and fell over 1 steps landing on right side. Left lower extremity deformity. EXAM: DG HIP (WITH OR WITHOUT PELVIS) 2-3V LEFT COMPARISON:  None Available. FINDINGS: Mild diffuse decreased bone mineralization. Minimal symmetric degenerative changes of the hips. There is a displaced intertrochanteric fracture of the left hip. Minimally displaced lesser trochanteric fragment. Minimal exaggerated coxa vera about the fracture site. Degenerative changes of the spine. IMPRESSION: Displaced intertrochanteric fracture of the left hip. Electronically Signed   By: Elberta Fortis  M.D.   On: 09/29/2022 13:12   DG Chest Port 1 View  Result Date: 09/29/2022 CLINICAL DATA:  Tripped and fell over one-step landing on right side. EXAM: PORTABLE CHEST 1 VIEW COMPARISON:  03/20/2022 FINDINGS: Lungs are hyperexpanded and otherwise clear. Mild stable cardiomegaly. No evidence of acute fracture. IMPRESSION: 1. No acute findings. 2. Mild stable cardiomegaly. Electronically Signed   By: Elberta Fortis M.D.   On: 09/29/2022 13:10  CT HEAD WO CONTRAST  Result Date: 09/29/2022 CLINICAL DATA:  Head trauma, minor EXAM: CT HEAD WITHOUT CONTRAST TECHNIQUE: Contiguous axial images were obtained from the base of the skull through the vertex without intravenous contrast. RADIATION DOSE REDUCTION: This exam was performed according to the departmental dose-optimization program which includes automated exposure control, adjustment of the mA and/or kV according to patient size and/or use of iterative reconstruction technique. COMPARISON:  CT head dated May 16, 2020 FINDINGS: Brain: No evidence of acute infarction, hemorrhage, hydrocephalus, extra-axial collection or mass lesion/mass effect. Patchy areas of low-attenuation of the periventricular and subcortical white matter presumed moderate chronic microvascular ischemic changes. Mild generalized cerebral atrophy. Vascular: No hyperdense vessel or unexpected calcification. Skull: Normal. Negative for fracture or focal lesion. Sinuses/Orbits: No acute finding. Other: None. IMPRESSION: 1. No acute intracranial abnormality. 2. Moderate chronic microvascular ischemic changes of the white matter. Mild generalized cerebral atrophy. Electronically Signed   By: Larose Hires D.O.   On: 09/29/2022 12:21    Labs:  Basic Metabolic Panel: Recent Labs  Lab 10/01/22 0625 10/02/22 0128 10/02/22 0812 10/02/22 1317 10/03/22 0851 10/04/22 0241 10/05/22 0705 10/05/22 1010 10/06/22 0125 10/06/22 1722 10/07/22 0601  NA 129* 129* 130*  --  131* 132* 135  --   134* 136 139  K 4.8 6.0* 5.5* 4.7 4.9 4.9 5.3* 5.3* 4.5 4.8 4.7  CL 98 97* 97*  --  102 103 108  --  109 110 113*  CO2 23 21* 23  --  22 20* 15*  --  19* 20* 19*  GLUCOSE 144* 255* 231*  --  183* 172* 165*  --  187* 177* 177*  BUN 30* 38* 40*  --  45* 47* 46*  --  49* 41* 38*  CREATININE 1.05* 1.30* 1.40*  --  1.39* 1.28* 1.05*  --  1.10* 0.93 0.86  CALCIUM 8.3* 8.0* 8.1*  --  7.7* 7.9* 7.6*  --  7.3* 7.8* 7.9*    CBC: Recent Labs  Lab 10/04/22 0241 10/06/22 0125 10/06/22 1141 10/07/22 0601  WBC 12.1* 10.2  --  12.7*  NEUTROABS  --   --   --  9.9*  HGB 7.4* 6.5* 8.3* 8.2*  HCT 23.2* 20.8* 25.5* 25.4*  MCV 100.4* 102.0*  --  98.1  PLT 134* 183  --  207    CBG: Recent Labs  Lab 10/07/22 0630 10/07/22 1000 10/07/22 1216 10/07/22 1640 10/07/22 2221  GLUCAP 168* 191* 139* 145* 133*   Family history.  Mother mother with history of CVA and hypertension.  Sister diabetes mellitus.  Denies any colon cancer esophageal cancer or rectal cancer  Brief HPI:   JAQUIRA EMCH is a 89 y.o.right handed female with history of atrial fibrillation maintained on Xarelto, diabetes mellitus, hypothyroidism.  Per chart review patient lives alone.  Split-level home.  Used a rollator and cane prior to admission.  She does have good family support.  Presented 09/29/2022 after mechanical fall when she missed 1 step landing on her left hip.  No loss of consciousness.  Cranial CT scan negative for acute changes.  X-rays and imaging revealed displaced intertrochanteric fracture of the left hip.  Admission chemistries unremarkable except sodium 132 glucose 246 hemoglobin 11.2.  Underwent open treatment of intertrochanteric hip fracture with intramedullary implant 10/01/2022 per Dr. Roda Shutters.  Weightbearing as tolerated left lower extremity.  Postoperative slurred speech left-sided facial droop.  MRI of the brain showed acute lacunar infarct in the bilateral corona radiata.  MRA negative for large vessel occlusion.   Carotid Dopplers with no ICA stenosis.  Echocardiogram with ejection fraction of 60 to 65% no wall motion abnormalities.  Neurology follow-up cleared to resume Xarelto was prior to admission.  Currently on a dysphagia #1 nectar thick liquid diet.  Hospital course acute blood loss anemia 6.5 transfused 1 unit packed red blood cells 10/06/2022.  Therapy evaluations completed due to patient decreased functional mobility was admitted for a comprehensive rehab program.   Hospital Course: IZZIE CHARETTE was admitted to rehab 10/06/2022 for inpatient therapies to consist of PT, ST and OT at least three hours five days a week. Past admission physiatrist, therapy team and rehab RN have worked together to provide customized collaborative inpatient rehab.  Pertaining to patient's left intertrochanteric hip fracture status post intramedullary nailing 10/03/2022.  Weightbearing as tolerated.  Neurovascular sensation intact.  Surgical site clean and dry.  Postoperative course lacunar infarcts of bilateral corona radiata.  Patient remained on chronic Xarelto as prior to admission follow-up neurology services.  Noted dysarthria as well as dysphagia followed by speech therapy.  Hospital course acute blood loss anemia latest hemoglobin 8.2.  She did have a history of diabetes mellitus hemoglobin A1c 5.3 she had been on Amaryl all prior to admission.  Permissive hypertension on lisinopril prior to admission blood pressure remained soft lisinopril on hold.  Hormone supplement ongoing for hypothyroidism.  Crestor for hyperlipidemia.  She did have a history of atrial fibrillation cardiac rate controlled maintained on Xarelto.  Hospital course failure to thrive palliative care was consulted to establish goals of care.   Blood pressures were monitored on TID basis and soft and monitored  Diabetes has been monitored with ac/hs CBG checks and SSI was use prn for tighter BS control.    Rehab course: During patient's stay in rehab  weekly team conferences were held to monitor patient's progress, set goals and discuss barriers to discharge. At admission, patient required total assist stand pivot transfers max assist step pivot transfers  Physical exam.  Blood pressure 117/64 pulse 75 temperature 98.2 respirations 17 oxygen saturation is 100% room air Constitutional.  Patient tearful with increased emotional lability HEENT Head.  Left facial droop Eyes.  Pupils round and reactive to light no discharge without nystagmus Neck.  Supple nontender no JVD without thyromegaly Cardiac regular rate and rhythm without any extra sounds or murmur heard Abdomen.  Soft nontender positive bowel sounds without rebound Respiratory effort normal no respiratory distress without wheeze Skin.  Surgical incision site clean and dry Neurologic.  Patient was a bit lethargic but awakens enough to carry a conversation and follow commands.  Fair insight and awareness.  She was dysarthric with left facial weakness.  Upper extremity grossly 4/5 proximal to distal, right lower extremity 3+ to 4/5.  Left lower extremity limited to pain.  He/She  has had improvement in activity tolerance, balance, postural control as well as ability to compensate for deficits. He/She has had improvement in functional use RUE/LUE  and RLE/LLE as well as improvement in awareness.  Therapies initiated slow progressive gains patient continued to refuse most therapies with limited endurance needing max to total assist.  Monitoring of oxygen saturations throughout shifts.  Due to patient's limited participation it was at family's request for palliative/hospice care.        Disposition: Discharge to hospice care/[facility]    Diet: Dysphagia #1 nectar liquids  Special Instructions: Weightbearing as tolerated left lower extremity  Medications at discharge.Marland KitchenMarland KitchenAs per  HOSPICE   30-35 minutes were spent completing discharge summary and discharge planning  Discharge  Instructions     Ambulatory referral to Neurology   Complete by: As directed    An appointment is requested in approximately: 4 weeks lacunar infarction        Follow-up Information     Kirsteins, Victorino Sparrow, MD Follow up.   Specialty: Physical Medicine and Rehabilitation Why: Office to call for appointment Contact information: [address] [place] [address] [place] [address] [phone]         Tarry Kos, MD Follow up.   Specialty: Orthopedic Surgery Why: Call for appointment Contact information: [address] [place] [address] [phone]                 Signed: Charlton Amor 10/08/2022, 5:16 AM

## 2022-10-08 NOTE — Progress Notes (Signed)
Patient ID: Amanda [name], female   DOB: 09/21/1918, 89 y.o.   MRN: [id number]    Progress Note from the Palliative Medicine Team at [facility]   Patient Name: Amanda [name]        Date: 10/08/2022 DOB: [birth year]  Age: 89 y.o. MRN#: [id number] Attending Physician: Erick Colace, MD Primary Care Physician: Lupita Raider, MD Admit Date: 10/06/2022   Medical records reviewed, discussed with treatment team   This NP went to the bedside to meet with family for goals of care discussion.  Hospice liaison present and meeting with family  Decision made for hospice benefit and plan is for transition to [facility] for end-of-life care.  PMT will sign off  No charge  Lorinda Creed NP  Palliative Medicine Team Team Phone # [phone] Pager [phone]

## 2022-10-08 NOTE — Plan of Care (Signed)
  Problem: RH Swallowing Goal: LTG Patient will consume least restrictive diet using compensatory strategies with assistance (SLP) Description: LTG:  Patient will consume least restrictive diet using compensatory strategies with assistance (SLP) Outcome: Not Met (add Reason) Flowsheets (Taken 10/08/2022 1229) LTG: Pt Patient will consume least restrictive diet using compensatory strategies with assistance of (SLP): Maximal Assistance - Patient 25 - 49% Note: Pt/ family requested discharge from therapy services prior to goal achievement.  Goal: LTG Patient will participate in dysphagia therapy to increase swallow function with assistance (SLP) Description: LTG:  Patient will participate in dysphagia therapy to increase swallow function with assistance (SLP) Outcome: Not Met (add Reason) Flowsheets (Taken 10/08/2022 1229) LTG: Pt will participate in dysphagia therapy to increase swallow function with assistance of (SLP): Maximal Assistance - Patient 25 - 49% Note: Pt/ family requested discharge from therapy services prior to goal achievement.  Goal: LTG Pt will demonstrate functional change in swallow as evidenced by bedside/clinical objective assessment (SLP) Description: LTG: Patient will demonstrate functional change in swallow as evidenced by bedside/clinical objective assessment (SLP) Outcome: Not Met (add Reason) Flowsheets (Taken 10/08/2022 1229) LTG: Patient will demonstrate functional change in swallow as evidenced by bedside/clinical objective assessment: Oropharyngeal swallow Note: Pt/ family requested discharge from therapy services prior to goal achievement.    Problem: RH Expression Communication Goal: LTG Patient will increase speech intelligibility (SLP) Description: LTG: Patient will increase speech intelligibility at word/phrase/conversation level with cues, % of the time (SLP) Outcome: Not Met (add Reason) Flowsheets (Taken 10/08/2022 1229) LTG: Patient will increase speech  intelligibility (SLP):  Moderate Assistance - Patient 50 - 74%  Maximal Assistance - Patient 25 - 49% Note: Pt/ family requested discharge from therapy services prior to goal achievement.

## 2022-10-08 NOTE — Progress Notes (Signed)
Speech Language Pathology Discharge Summary  Patient Details  Name: Amanda [name] MRN: [id number] Date of Birth: 1918-07-31  Date of Discharge from SLP service:Oct 08, 2022  Today's Date: 10/08/2022  Patient has met  (0) of 4 long term goals.  Patient to discharge at overall Mod;Max level.  Reasons goals not met: Pt/ family requested discharge from therapy services prior to goal achievement.   Clinical Impression/Discharge Summary: Patient has met 0 of 4 long term goals due to patient and family declining continuation of CIR program and requesting hospice services.  Pt is currently an overall mod to max A for speech intelligibility and is ~50% intelligible in words and phrases. She also requires max verbal cues for utilization of swallowing compensatory strategies to minimize overt s/s of aspiration with puree and NTL diet. Pt/ family education complete and pt will discharge with 24 hour supervision from family and hospice services.  Care Partner:  Caregiver Able to Provide Assistance: Yes  Type of Caregiver Assistance: Physical;Cognitive  Recommendation:  24 hour supervision/assistance      Equipment: suction tubing and cannister   Reasons for discharge: Discharged from hospital;Other (comment) (Patient and family request for hospice services)   Patient/Family Agrees with Progress Made and Goals Achieved: Yes    Renaee Munda 10/08/2022, 12:44 PM

## 2022-10-08 NOTE — Patient Care Conference (Signed)
Inpatient RehabilitationTeam Conference and Plan of Care Update Date: 10/08/2022   Time: 10:36 AM    Patient Name: Amanda [name]      Medical Record Number: [id number]  Date of Birth: 03-12-1919 Sex: Female         Room/Bed: 4W11C/4W11C-01 Payor Info: Payor: MEDICARE / Plan: MEDICARE PART A AND B / Product Type: *No Product type* /    Admit Date/Time:  10/06/2022  4:43 PM  Primary Diagnosis:  Embolic stroke [facility])  Hospital Problems: Principal Problem:   Embolic stroke [facility]) Active Problems:   Lacunar infarction [facility])    Expected Discharge Date: Expected Discharge Date: 10/08/22  Team Members Present: Physician leading conference: Dr. Claudette Laws Social Worker Present: Lavera Guise, BSW Nurse Present: Chana Bode, RN PT Present: Casimiro Needle, PT OT Present: Bonnell Public, OT SLP Present: Feliberto Gottron, SLP PPS Coordinator present : Fae Pippin, SLP     Current Status/Progress Goal Weekly Team Focus  Bowel/Bladder   Pt is incontinent of bowel/bladder Purewick used at HS  Pt's skin will be cleansed after each incontinent episode   Will assess qshift and PRN    Swallow/Nutrition/ Hydration   mod to max A with Dys 1 and nectar thick liquid   mod A  consume current diet, trials of thin liquid for pleasure, pt/ family education, alertness    ADL's   therapy being d/c'd d/t plan for hospice care per family and pt request   unplanned d/c   evaluation, Pt/family education, BADL retraining, transfer trianing, DME education, standing/sitting balance/tolerance    Mobility   max A bed mobility using bed features, mod/max A sit<>stand & max A stand pivot using RW with L knee buckling when transferring towards L requiring +2 assist for safety, no ambulation yet due to pain limited - pt very emotional about her pain and reports feeling she has lived long enough - updated on 5/22 with pt and family requesting transition to hospice   min assist overall  transfer/wheelchair level - updated on 5/22 with therapies D/C'd due to plan for transition to hospice  pt/family education, pain management, bed mobility training, transfer training, DME training, progression towards gait, standing tolerance    Communication   mod dysarthria ~50% intelligble in words and 40% in phrases and sentences   min a   implementation of compensatory strategies.    Safety/Cognition/ Behavioral Observations               Pain   Pt c/o pain in the left leg   Pt's pain is controlled on medication and alternate measures   Will assess qshift and PRN    Skin   Pt has incisions to the left hip and thigh   Pt's incisions will heal  Will assess qshift and PRN      Discharge Planning:  Pt will discharge home with [facility] services and family to provide 24/7 supervision. Daughter able to provide supervision only and son able to provide PRN supervision.   Team Discussion: Patient not tolerating therapy post embolic stroke after hip fracture; due to pain, decreased motivation. Palliative Care consulted and referral to inpatient hospice care completed.   Patient on target to meet rehab goals: Unable to complete the rehab program; therapy discontinued.  *See Care Plan and progress notes for long and short-term goals.   Revisions to Treatment Plan:  Palliative Care Consult Inpatient Hospice Care consulted Changed to comfort care   Teaching Needs: Safety, medications, skin care, toileting, etc.  Current Barriers to Discharge: Home enviroment access/layout, Incontinence, and Behavior  Possible Resolutions to Barriers: Palliative Care Consult Inpatient Hospice Care consulted; [facility] rounding     Medical Summary Current Status: fatigue , Left hip pain, pt not eating , poor tlerance for activity  Barriers to Discharge: Uncontrolled Pain;Inadequate Nutritional Intake   Possible Resolutions to [employer] Focus: palliative care for GOC, eval for inpt  hospice   Continued Need for Acute Rehabilitation Level of Care: The patient requires daily medical management by a physician with specialized training in physical medicine and rehabilitation for the following reasons: Direction of a multidisciplinary physical rehabilitation program to maximize functional independence : Yes Medical management of patient stability for increased activity during participation in an intensive rehabilitation regime.: Yes Analysis of laboratory values and/or radiology reports with any subsequent need for medication adjustment and/or medical intervention. : Yes   I attest that I was present, lead the team conference, and concur with the assessment and plan of the team.   Chana Bode B 10/08/2022, 2:11 PM

## 2022-10-08 NOTE — Progress Notes (Addendum)
PROGRESS NOTE   Subjective/Complaints:  Famiy now pursuing palliative consult, potential inpt hospice, pt not eating or drinking well , 60ml recorded, 10% meals Discussed labs and poor intake Discussed medical issues as well as discharge planning with daughter, son as well as son-in-law  ROS- no SOB, no N/V Objective:   No results found. Recent Labs    10/06/22 0125 10/06/22 1141 10/07/22 0601  WBC 10.2  --  12.7*  HGB 6.5* 8.3* 8.2*  HCT 20.8* 25.5* 25.4*  PLT 183  --  207    Recent Labs    10/06/22 1722 10/07/22 0601  NA 136 139  K 4.8 4.7  CL 110 113*  CO2 20* 19*  GLUCOSE 177* 177*  BUN 41* 38*  CREATININE 0.93 0.86  CALCIUM 7.8* 7.9*     Intake/Output Summary (Last 24 hours) at 10/08/2022 0814 Last data filed at 10/07/2022 1000 Gross per 24 hour  Intake 60 ml  Output --  Net 60 ml         Physical Exam: Vital Signs Blood pressure (!) 166/55, pulse 76, temperature 98.1 F (36.7 C), temperature source Axillary, resp. rate 18, height 5\' 3"  (1.6 m), weight 79.4 kg, SpO2 98 %.  ENT:  + HOH  General: alert No acute distress Mood and affect are appropriate Heart: Regular rate and rhythm no rubs murmurs or extra sounds Lungs: Clear to auscultation, breathing unlabored, no rales or wheezes Abdomen: Positive bowel sounds, soft nontender to palpation, nondistended Extremities: No clubbing, cyanosis, or edema Skin: No evidence of breakdown, no evidence of rash Neurologic: Cranial nerves II through XII intact, motor strength is 5/5 in right 4/5 Left  deltoid, bicep, tricep, grip, 4/5 Right and0/5 left hip flexor, knee extensors, ankle dorsiflexor and plantar flexor Sensory exam reduced  sensation to light touch left han vs right side   Musculoskeletal: pain inhibits movement Left LE, thigh swollen as expected small amt of bruising around incision sites  Serosang drainage Left hip  incision  Assessment/Plan: 1. Functional deficits which require 3+ hours per day of interdisciplinary therapy in a comprehensive inpatient rehab setting. Physiatrist is providing close team supervision and 24 hour management of active medical problems listed below. Physiatrist and rehab team continue to assess barriers to discharge/monitor patient progress toward functional and medical goals  Care Tool:  Bathing    Body parts bathed by patient: Face, Left arm, Right arm, Chest   Body parts bathed by helper: Abdomen, Front perineal area, Buttocks, Right upper leg, Left upper leg, Left lower leg, Right lower leg     Bathing assist Assist Level: Maximal Assistance - Patient 24 - 49%     Upper Body Dressing/Undressing Upper body dressing   What is the patient wearing?: Dress    Upper body assist Assist Level: Maximal Assistance - Patient 25 - 49%    Lower Body Dressing/Undressing Lower body dressing      What is the patient wearing?: Underwear/pull up     Lower body assist Assist for lower body dressing: Dependent - Patient 0%     Toileting Toileting    Toileting assist Assist for toileting: Dependent - Patient 0%  Transfers Chair/bed transfer  Transfers assist     Chair/bed transfer assist level: 2 Helpers Chair/bed transfer assistive device: Arboriculturist assist   Ambulation activity did not occur: Safety/medical concerns          Walk 10 feet activity   Assist  Walk 10 feet activity did not occur: Safety/medical concerns        Walk 50 feet activity   Assist Walk 50 feet with 2 turns activity did not occur: Safety/medical concerns         Walk 150 feet activity   Assist Walk 150 feet activity did not occur: Safety/medical concerns         Walk 10 feet on uneven surface  activity   Assist Walk 10 feet on uneven surfaces activity did not occur: Safety/medical concerns          Wheelchair     Assist Is the patient using a wheelchair?: Yes Type of Wheelchair: Manual    Wheelchair assist level: Dependent - Patient 0%      Wheelchair 50 feet with 2 turns activity    Assist        Assist Level: Dependent - Patient 0%   Wheelchair 150 feet activity     Assist      Assist Level: Dependent - Patient 0%   Blood pressure (!) 166/55, pulse 76, temperature 98.1 F (36.7 C), temperature source Axillary, resp. rate 18, height 5\' 3"  (1.6 m), weight 79.4 kg, SpO2 98 %.  Medical Problem List and Plan: 1. Functional deficits secondary to acute lacunar infarcts of bilateral corona radiata after left intertrochanteric hip fracture.  Status post intramedullary nailing 10/03/2022.  Weightbearing as tolerated             -patient may shower             -d/c therapy pursue hospice, no IVF as family does not want to pursue aggressive measures  Would expect demise of the on the order of days if no IV hydration or supplemental caloric intake is provided Comfort care allow thin liquids 2.  Antithrombotics: -DVT/anticoagulation:  Pharmaceutical: Xarelto             -antiplatelet therapy: N/A 3. Pain Management: Oxycodone/Robaxin as needed Is able to take meds orally 4. Mood/Behavior/Sleep: Team to provide emotional support             -pt with anxiety and likely reactive depression about being in hospital as well as the stroke and hip fracture themselves.              -antipsychotic agents: N/A 5. Neuropsych/cognition: This patient is capable of making decisions on her own behalf. 6. Skin/Wound Care: Routine skin checks 7. Fluids/Electrolytes/Nutrition: Routine in and outs with follow-up chemistries 8.  Acute blood loss anemia.  Follow-up CBC 9.  Diabetes mellitus.  Latest hemoglobin A1c 5.7.  Currently on SSI.  Patient on Amaryl 1.5 mg daily prior to admission.  Resume as needed             CBG (last 3)  Recent Labs    10/07/22 1640 10/07/22 2221  10/08/22 0651  GLUCAP 145* 133* 152*    Poor intake  10.  Permissive hypertension.  Patient on lisinopril 20 mg daily prior to admission.   Vitals:   10/07/22 2118 10/08/22 0500  BP: (!) 162/72 (!) 166/55  Pulse: 84 76  Resp: 18 18  Temp: 98.8 F (37.1 C) 98.1 F (  36.7 C)  SpO2: 100% 98%    11.  Hypothyroidism.  Synthroid 12.  Hyperlipidemia.  Crestor 13.  Atrial fibrillation.  Xarelto resumed.  Cardiac rate controlled, regular rhythm.    LOS: 2 days A FACE TO FACE EVALUATION WAS PERFORMED  Erick Colace 10/08/2022, 8:14 AM

## 2022-10-08 NOTE — Progress Notes (Signed)
Patient ID: Amanda [name], female   DOB: Jan 19, 1919, 89 y.o.   MRN: [id number]  Team Conference Report to Patient/Family  Team Conference discussion was reviewed with the patient and caregiver, including goals, any changes in plan of care and target discharge date.  Patient and caregiver express understanding and are in agreement.  The patient has a target discharge date of  (Pending).  Intake initiated with intake with [facility]. Information will be provided to hospital team to meet with family to determine if pt appropriate for [facility]. SW will wait for FU from team. No additional questions or concerns.   Andria Rhein 10/08/2022, 1:23 PM

## 2022-10-08 NOTE — Progress Notes (Signed)
Physical Therapy Discharge Summary  Patient Details  Name: Amanda [name] MRN: [id number] Date of Birth: 07/07/1918  Date of Discharge from PT service:Oct 08, 2022  Patient has met 0 of 8 long term goals due to patient and family declining this level of therapy and planning to pursue hospice services. Information below is from last time assessed at initial evaluation due to not being seen by therapy today.  Patient to discharge with hospice services.   Reasons goals not met: Patient and patient's family declined participation in inpatient rehab program.   Recommendation:  Patient will receive hospice services.  Equipment: Will be provided by hospice services  Reasons for discharge:  Patient and family requested change in plan of care for patient to D/C home with hospice  Patient/family agrees with progress made and goals achieved: Yes  PT Discharge Precautions/Restrictions Restrictions Weight Bearing Restrictions: Yes RLE Weight Bearing: Weight bearing as tolerated LLE Weight Bearing: Weight bearing as tolerated Pain Pain Assessment Pain Scale: Faces Pain Score: 0-No pain Faces Pain Scale: Hurts whole lot Pain Type: Surgical pain Pain Location: Hip Pain Orientation: Left Pain Descriptors / Indicators: Aching;Grimacing;Guarding;Discomfort Pain Onset: With Activity (increases when sitting upright) Pain Intervention(s): Medication (See eMAR);RN made aware;Repositioned;Distraction;Emotional support;Elevated extremity Multiple Pain Sites: No Pain Interference Pain Interference Pain Effect on Sleep: 3. Frequently Pain Interference with Therapy Activities: 3. Frequently;4. Almost constantly Pain Interference with Day-to-Day Activities: 4. Almost constantly Vision/Perception  Vision - History Ability to See in Adequate Light: 0 Adequate Perception Perception: Within Functional Limits Praxis Praxis: Impaired Praxis Impairment Details: Motor planning Praxis-Other  Comments: when experiencig pain her motor planning and awareness is impacted  Cognition Overall Cognitive Status: Difficult to assess (due to perseverating on pain and difficulty to understand) Arousal/Alertness: Lethargic Orientation Level: Oriented to person;Oriented to place;Oriented to situation Month: May Attention: Focused;Sustained Focused Attention: Appears intact Sustained Attention: Impaired Awareness: Impaired Safety/Judgment: Impaired Comments: pt would scoot hips forward in w/c with decreased awareness of when she is getting close to the front edge Sensation Sensation Light Touch: Impaired Detail (will need to further assess) Hot/Cold: Not tested Proprioception: Impaired by gross assessment Stereognosis: Not tested Coordination Gross Motor Movements are Fluid and Coordinated: No Fine Motor Movements are Fluid and Coordinated: No Coordination and Movement Description: limited by pain and strength deficits Motor  Motor Motor: Other (comment);Abnormal postural alignment and control Motor - Skilled Clinical Observations: Limited by pain in L LE and decreased strength with generalized deconditioning  Mobility Bed Mobility Bed Mobility: Supine to Sit;Sit to Supine Rolling Right: Dependent - Patient equal 0% Rolling Left: Total Assistance - Patient < 25%;Dependent - Patient equal 0% Supine to Sit: Total Assistance - Patient < 25% (using bed features) Sit to Supine: Total Assistance - Patient < 25% (using bed features) Transfers Transfers: Sit to Stand;Stand to Sit;Stand Pivot Transfers Sit to Stand: Maximal Assistance - Patient 25-49% Stand to Sit: Maximal Assistance - Patient 25-49% Stand Pivot Transfers: 2 Helpers;Maximal Assistance - Patient 25 - 49% (pt's L knee started to buckle during transfer back to bed towards L) Stand Pivot Transfer Details: Tactile cues for sequencing;Tactile cues for weight shifting;Verbal cues for precautions/safety;Verbal cues for  technique;Verbal cues for sequencing;Verbal cues for gait pattern;Manual facilitation for weight shifting;Verbal cues for safe use of DME/AE;Tactile cues for posture;Visual cues for safe use of DME/AE;Visual cues/gestures for sequencing Transfer (Assistive device): Rolling walker Locomotion  Gait Ambulation: No Gait Gait: No Stairs / Additional Locomotion Stairs: No Wheelchair Mobility Wheelchair Mobility: Yes Wheelchair Assistance:  Dependent - Patient 0% Wheelchair Parts Management: Needs assistance Distance: 141ft Trunk/Postural Assessment  Cervical Assessment Cervical Assessment: Exceptions to [facility] (mild forward head) Thoracic Assessment Thoracic Assessment: Exceptions to [facility] (mild rounded shoulders) Lumbar Assessment Lumbar Assessment: Exceptions to [facility] (posterior pelvic tilt, especially due to pain in L hip with seated positioning) Postural Control Postural Control: Deficits on evaluation Balance Balance Balance Assessed: Yes Static Sitting Balance Static Sitting - Balance Support: Feet supported Static Sitting - Level of Assistance: 3: Mod assist;4: Min assist Dynamic Sitting Balance Dynamic Sitting - Balance Support: Feet supported Dynamic Sitting - Level of Assistance: 3: Mod assist;2: Max assist Static Standing Balance Static Standing - Balance Support: Bilateral upper extremity supported;During functional activity Static Standing - Level of Assistance: 2: Max assist Extremity Assessment  RLE Assessment RLE Assessment: Exceptions to [facility] Active Range of Motion (AROM) Comments: WFL for tasks assessed General Strength Comments: unable to formally assess due to pain perseverations making it difficult to follow commands - at least 3+/5 demoed throughout during mobility LLE Assessment Active Range of Motion (AROM) Comments: decreased L knee flexion ROM and guarding with L hip flexion as well General Strength Comments: unable to formally assess due to pain perseverations  making it difficult to follow commands - at least 2+/5 to 3/5 with pt having active momvement in all joints   Ginny Forth , PT, DPT, NCS, CSRS 10/08/2022, 10:43 AM

## 2022-10-08 NOTE — Progress Notes (Signed)
Inpatient Rehabilitation Admissions Coordinator  I received a call from son, Kathlene November. He states patient and family are requesting to transition to [facility]. I have notified SW and acute team.  Ottie Glazier, RN, MSN Rehab Admissions Coordinator [phone] 10/08/2022 8:23 AM]

## 2022-10-08 NOTE — Progress Notes (Addendum)
Patient ID: Tally Due, female   DOB: [birth year], 89 y.o.   MRN: [id number]  Pt has been approved for [facility]. Waiting on consents to be signed and then we can arrange transport.   1:44 PM: Consents received PTAR to be arranged 3:30/4PM.   1:47 PM: PTAR arranged.    1:56 PM d/c packet left at nursing station.

## 2022-10-08 NOTE — Progress Notes (Signed)
[facility]) Hospital Liaison Note  Received request from [facility] [place], [place], for family interest in [facility].  Met with family and patient in room to confirm interest and explain services.  Patient eligibility confirmed and bed available.  Family agreeable to transfer today.  TOC updated.  RN, please leave all IV's in place for transport to facility.  Please send signed and completed DNR with patient.  Please call report at any time prior to patient leaving unit to [phone].  Thank you for the opportunity to participate in this patient's care.  Doreatha Martin, RN, BSN [facility] Liaison [phone]

## 2022-10-08 NOTE — Progress Notes (Signed)
Inpatient Rehabilitation Discharge Medication Review by a Pharmacist  A complete drug regimen review was completed for this patient to identify any potential clinically significant medication issues.  High Risk Drug Classes Is patient taking? Indication by Medication  Antipsychotic No   Anticoagulant No   Antibiotic No   Opioid No   Antiplatelet No   Hypoglycemics/insulin No   Vasoactive Medication No   Chemotherapy No   Other No      Type of Medication Issue Identified Description of Issue Recommendation(s)  Drug Interaction(s) (clinically significant)     Duplicate Therapy     Allergy     No Medication Administration End Date     Incorrect Dose     Additional Drug Therapy Needed     Significant med changes from prior encounter (inform family/care partners about these prior to discharge). Discharging to hospice facility. All medications discontinued.  Medications per Hospice team at facility.  Other       Clinically significant medication issues were identified that warrant physician communication and completion of prescribed/recommended actions by midnight of the next day:  No  Time spent performing this drug regimen review (minutes):  10   Dennie Fetters, [place] 10/08/2022 3:21 PM

## 2022-10-09 ENCOUNTER — Telehealth: Payer: Self-pay

## 2022-10-09 NOTE — IPOC Note (Signed)
Overall Plan of Care [facility]) Patient Details Name: Amanda [name] MRN: [id number] DOB: 11/04/1918  Admitting Diagnosis: Embolic stroke [facility])  Hospital Problems: Principal Problem:   Embolic stroke [facility]) Active Problems:   Lacunar infarction [facility])     Functional Problem List: Nursing Bladder, Bowel, Safety, Medication Management, Pain, Endurance  PT Balance, Perception, Behavior, Edema, Sensory, Skin Integrity, Endurance, Motor, Nutrition, Pain, Safety  OT Balance, Safety, Sensory, Edema, Skin Integrity, Endurance, Motor, Pain, Nutrition  SLP Linguistic  TR         Basic ADL's: OT Eating, Grooming, Bathing, Dressing, Toileting     Advanced  ADL's: OT Simple Meal Preparation     Transfers: PT Bed Mobility, Bed to Chair, Car, Lobbyist, Technical brewer: PT Ambulation, Psychologist, prison and probation services, Stairs     Additional Impairments: OT    SLP Swallowing, Communication      TR      Anticipated Outcomes Item Anticipated Outcome  Self Feeding Set-up  Swallowing  Mod A   Basic self-care  Min A  Toileting  Min A   Bathroom Transfers Min A  Bowel/Bladder  manage bowel with mod I and bladder with toileting  Transfers  min assist using LRAD  Locomotion  mod assist using LRAD  Communication  Min A  Cognition     Pain  pain < 4     with prns  Safety/Judgment  manage safety w cues   Therapy Plan: PT Intensity: Minimum of 1-2 x/day ,45 to 90 minutes PT Frequency: 5 out of 7 days PT Duration Estimated Length of Stay: 2-2.5 weeks OT Intensity: Minimum of 1-2 x/day, 45 to 90 minutes OT Frequency: 5 out of 7 days OT Duration/Estimated Length of Stay: 2-2.5 weeks SLP Intensity: Minumum of 1-2 x/day, 30 to 90 minutes SLP Frequency: 1 to 3 out of 7 days SLP Duration/Estimated Length of Stay: 2 weeks   Team Interventions: Nursing Interventions Bowel Management, Patient/Family Education, Pain Management, Discharge Planning, Medication Management,  Disease Management/Prevention, Bladder Management  PT interventions Ambulation/gait training, Community reintegration, DME/adaptive equipment instruction, Neuromuscular re-education, Psychosocial support, Stair training, UE/LE Strength taining/ROM, Wheelchair propulsion/positioning, Warden/ranger, Discharge planning, Pain management, Skin care/wound management, Therapeutic Activities, UE/LE Coordination activities, Cognitive remediation/compensation, Disease management/prevention, Functional mobility training, Patient/family education, Splinting/orthotics, Therapeutic Exercise  OT Interventions Discharge planning, Balance/vestibular training, Pain management, Self Care/advanced ADL retraining, Therapeutic Activities, UE/LE Coordination activities, Cognitive remediation/compensation, Disease mangement/prevention, Functional mobility training, Patient/family education, Skin care/wound managment, Therapeutic Exercise, Visual/perceptual remediation/compensation, Firefighter, Fish farm manager, Neuromuscular re-education, Psychosocial support, Splinting/orthotics, UE/LE Strength taining/ROM, Wheelchair propulsion/positioning  SLP Interventions Dysphagia/aspiration precaution training, Internal/external aids, Patient/family education, Multimodal communication approach, Functional tasks, Therapeutic Activities  TR Interventions    SW/CM Interventions Discharge Planning, Psychosocial Support, Patient/Family Education, Disease Management/Prevention   Barriers to Discharge MD   Changed to comfort care  Nursing Home environment access/layout, Lack of/limited family support I PTA with dressing; aide once week to asist her in showering; family provides meals; can only provide supervision, Pt lives alone in split level house, 3 stairs up/down stairs with one side railing. Pt uses rollator and cane in home.  PT Inaccessible home environment, Home environment  access/layout split level home  OT      SLP      SW Decreased caregiver support, Lack of/limited family support     Team Discharge Planning: Destination: PT-Home ,OT- Home , SLP-  Projected Follow-up: PT-Home health PT, 24 hour supervision/assistance, OT-  Home health OT,  SLP-24 hour supervision/assistance Projected Equipment Needs: PT-To be determined, OT- To be determined, SLP-To be determined Equipment Details: PT- , OT-  Patient/family involved in discharge planning: PT- Patient, Family member/caregiver,  OT-Patient, Family member/caregiver, SLP-Family member/caregiver  MD ELOS: 3-5d Medical Rehab Prognosis:  Guarded Assessment: The patient has been admitted for CIR therapies with the diagnosis of embolic CVA. The team will be addressing functional mobility, strength, stamina, balance, safety, adaptive techniques and equipment, self-care, bowel and bladder mgt, patient and caregiver education, assess goals of care. Goals have been set at NA. Anticipated discharge destination is Hospice.        See Team Conference Notes for weekly updates to the plan of care

## 2022-10-09 NOTE — Telephone Encounter (Signed)
Transition Care Management Unsuccessful Follow-up Telephone Call  Date of discharge and from where:  10/08/2022  Attempts:  1st Attempt  Reason for unsuccessful TCM follow-up call:  Left voice message

## 2022-10-22 ENCOUNTER — Encounter: Payer: Medicare Other | Attending: Registered Nurse | Admitting: Registered Nurse

## 2022-11-12 ENCOUNTER — Encounter (INDEPENDENT_AMBULATORY_CARE_PROVIDER_SITE_OTHER): Payer: Medicare Other | Admitting: Ophthalmology
